# Patient Record
Sex: Female | Born: 1955 | Race: White | Hispanic: No | Marital: Married | Smoking: Current every day smoker
Health system: Southern US, Community
[De-identification: ages and names within clinical notes are randomized; demographics above are authoritative.]

## PROBLEM LIST (undated history)

## (undated) DIAGNOSIS — Z8719 Personal history of other diseases of the digestive system: Secondary | ICD-10-CM

## (undated) DIAGNOSIS — R7303 Prediabetes: Secondary | ICD-10-CM

## (undated) DIAGNOSIS — K5900 Constipation, unspecified: Secondary | ICD-10-CM

## (undated) DIAGNOSIS — E039 Hypothyroidism, unspecified: Secondary | ICD-10-CM

## (undated) DIAGNOSIS — E269 Hyperaldosteronism, unspecified: Secondary | ICD-10-CM

## (undated) DIAGNOSIS — M1990 Unspecified osteoarthritis, unspecified site: Secondary | ICD-10-CM

## (undated) DIAGNOSIS — F17210 Nicotine dependence, cigarettes, uncomplicated: Secondary | ICD-10-CM

## (undated) DIAGNOSIS — R112 Nausea with vomiting, unspecified: Secondary | ICD-10-CM

## (undated) DIAGNOSIS — E876 Hypokalemia: Secondary | ICD-10-CM

## (undated) DIAGNOSIS — G4700 Insomnia, unspecified: Secondary | ICD-10-CM

## (undated) DIAGNOSIS — K219 Gastro-esophageal reflux disease without esophagitis: Secondary | ICD-10-CM

## (undated) DIAGNOSIS — K859 Acute pancreatitis without necrosis or infection, unspecified: Secondary | ICD-10-CM

## (undated) DIAGNOSIS — J45909 Unspecified asthma, uncomplicated: Secondary | ICD-10-CM

## (undated) DIAGNOSIS — I499 Cardiac arrhythmia, unspecified: Secondary | ICD-10-CM

## (undated) DIAGNOSIS — F329 Major depressive disorder, single episode, unspecified: Secondary | ICD-10-CM

## (undated) DIAGNOSIS — G43909 Migraine, unspecified, not intractable, without status migrainosus: Secondary | ICD-10-CM

## (undated) DIAGNOSIS — E079 Disorder of thyroid, unspecified: Secondary | ICD-10-CM

## (undated) DIAGNOSIS — I4891 Unspecified atrial fibrillation: Secondary | ICD-10-CM

## (undated) DIAGNOSIS — I10 Essential (primary) hypertension: Secondary | ICD-10-CM

## (undated) DIAGNOSIS — F32A Depression, unspecified: Secondary | ICD-10-CM

## (undated) DIAGNOSIS — Z9889 Other specified postprocedural states: Secondary | ICD-10-CM

## (undated) HISTORY — DX: Essential (primary) hypertension: I10

## (undated) HISTORY — DX: Disorder of thyroid, unspecified: E07.9

## (undated) HISTORY — DX: Unspecified asthma, uncomplicated: J45.909

## (undated) HISTORY — DX: Migraine, unspecified, not intractable, without status migrainosus: G43.909

## (undated) HISTORY — DX: Hyperaldosteronism, unspecified: E26.9

## (undated) HISTORY — DX: Depression, unspecified: F32.A

## (undated) HISTORY — PX: ULNAR NERVE REPAIR: SHX2594

## (undated) HISTORY — DX: Insomnia, unspecified: G47.00

## (undated) HISTORY — DX: Major depressive disorder, single episode, unspecified: F32.9

## (undated) HISTORY — PX: OTHER SURGICAL HISTORY: SHX169

## (undated) HISTORY — DX: Nicotine dependence, cigarettes, uncomplicated: F17.210

## (undated) HISTORY — DX: Hypokalemia: E87.6

## (undated) HISTORY — DX: Unspecified atrial fibrillation: I48.91

## (undated) HISTORY — PX: SHOULDER SURGERY: SHX246

## (undated) HISTORY — DX: Acute pancreatitis without necrosis or infection, unspecified: K85.90

---

## 2013-05-15 HISTORY — PX: TOTAL ABDOMINAL HYSTERECTOMY: SHX209

## 2018-01-11 ENCOUNTER — Encounter: Payer: Self-pay | Admitting: *Deleted

## 2018-01-15 ENCOUNTER — Ambulatory Visit: Payer: BC Managed Care – PPO | Admitting: Cardiology

## 2018-01-15 ENCOUNTER — Encounter: Payer: Self-pay | Admitting: *Deleted

## 2018-01-15 VITALS — BP 100/66 | HR 78 | Ht 66.5 in | Wt 171.6 lb

## 2018-01-15 DIAGNOSIS — I480 Paroxysmal atrial fibrillation: Secondary | ICD-10-CM

## 2018-01-15 DIAGNOSIS — I10 Essential (primary) hypertension: Secondary | ICD-10-CM | POA: Diagnosis not present

## 2018-01-15 DIAGNOSIS — I351 Nonrheumatic aortic (valve) insufficiency: Secondary | ICD-10-CM

## 2018-01-15 MED ORDER — TELMISARTAN 20 MG PO TABS: 20.0000 mg | ORAL_TABLET | Freq: Every day | ORAL | 1 refills | Status: DC

## 2018-01-15 NOTE — Progress Notes (Signed)
Clinical Summary Ms. Wachter is a 62 y.o.female seen today as a new consult, referred by Dr Manuella Ghazi for history of PAF and resistant HTN.   Previously followed by Dr Ruel Favors at [facility]   1. PAF - beta blocker stopped by endocrinology - no recent palpitations - no bleeding on eliquis    CHADS2Vasc score (HTN, women)2, has been on eliquis.  - she reports 2 episodes of afib in setting of hypokalemia. She notes a monitor after this episode with her prior cardiologist that was benign - remains on eliquis.   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 No evidence of renal artery stenosis by [place]  - has had some low bp's, orthostatic hypotension 80s/50s - she lowered the telmisartan to 40mg  daily on her own.  - 1 cup of coffee, 1 cup tea. Water yeti 20 oz x 8.   3. Hyperaldosteronism - followed at [facility]. Recs for medical management after renal vein renin sampling did not localize - aldactone increased to 100mg  daily - still followed at [facility]   4. Leg tremors - being worked up at [facility] by neurology   5. Mild to moderate AI - noted by echo 2018      Prior caridologt notes  01/18/2017 Echo showed mild aortic valve sclerosis with mild to moderate aortic regurgitation otherwise no significant structural heart disease present. Lexiscan nuclear stress test was normal. 02/05/2017 nuclear stress test: Lexiscan nuclear stress tes tNo significant EKG changes. Perfusion scan is normal with a normal ejection fraction and no ischemia. This is considered low risk result Past Medical History:  Diagnosis Date  . Afib ([place])   . Asthma   . Depression   . Hyperaldosteronism ([place])   . Hypertension   . Hypokalemia   . Insomnia   . Migraine   . Nicotine dependence, cigarettes, uncomplicated   . Thyroid disease    hypothyroid     Allergies  Allergen  Reactions  . Codeine Itching     Current Outpatient Medications  Medication Sig Dispense Refill  . albuterol (PROAIR HFA) 108 (90 Base) MCG/ACT inhaler Inhale 1 puff into the lungs as needed for wheezing or shortness of breath.    Marland Kitchen apixaban (ELIQUIS) 5 MG TABS tablet Take 5 mg by mouth 2 (two) times daily.    . carbidopa-levodopa (SINEMET IR) 25-100 MG tablet Take 1 tablet by mouth at bedtime.    . citalopram (CELEXA) 10 MG tablet Take 10 mg by mouth daily.    . fluticasone (FLONASE) 50 MCG/ACT nasal spray Place 1 spray into both nostrils as needed for allergies or rhinitis.    Marland Kitchen gabapentin (NEURONTIN) 300 MG capsule Take 300 mg by mouth daily.    Marland Kitchen levothyroxine (SYNTHROID, LEVOTHROID) 112 MCG tablet Take 112 mcg by mouth daily before breakfast.    . spironolactone (ALDACTONE) 100 MG tablet Take 100 mg by mouth daily.    Marland Kitchen telmisartan (MICARDIS) 80 MG tablet Take 40 mg by mouth daily.    Marland Kitchen topiramate (TOPAMAX) 100 MG tablet Take 100 mg by mouth 2 (two) times daily.    . traZODone (DESYREL) 100 MG tablet Take 100 mg by mouth at bedtime.     No current facility-administered medications for this visit.         Allergies  Allergen Reactions  . Codeine Itching  Family History  Problem Relation Age of Onset  . Hypertension Mother   . Thyroid disease Mother   . Thyroid cancer Cousin      Social History Ms. Geraghty reports that she has been smoking. She does not have any smokeless tobacco history on file. Ms. Feeser has no alcohol history on file.   Review of Systems CONSTITUTIONAL: No weight loss, fever, chills, weakness or fatigue.  HEENT: Eyes: No visual loss, blurred vision, double vision or yellow sclerae.No hearing loss, sneezing, congestion, runny nose or sore throat.  SKIN: No rash or itching.  CARDIOVASCULAR: per hpi RESPIRATORY: No shortness of breath, cough or sputum.  GASTROINTESTINAL: No anorexia, nausea, vomiting or diarrhea. No abdominal pain or  blood.  GENITOURINARY: No burning on urination, no polyuria NEUROLOGICAL: No headache, dizziness, syncope, paralysis, ataxia, numbness or tingling in the extremities. No change in bowel or bladder control.  MUSCULOSKELETAL: No muscle, back pain, joint pain or stiffness.  LYMPHATICS: No enlarged nodes. No history of splenectomy.  PSYCHIATRIC: No history of depression or anxiety.  ENDOCRINOLOGIC: No reports of sweating, cold or heat intolerance. No polyuria or polydipsia.  Marland Kitchen   Physical Examination Vitals:   01/15/18 1440 01/15/18 1447  BP: 121/79 100/66  Pulse: 79 78  SpO2: 97% 97%   Vitals:   01/15/18 1440  Weight: 171 lb 9.6 oz (77.8 kg)  Height: 5' 6.5" (1.689 m)    Gen: resting comfortably, no acute distress HEENT: no scleral icterus, pupils equal round and reactive, no palptable cervical adenopathy,  CV: RRR, no m/r/g, no jvd Resp: Clear to auscultation bilaterally GI: abdomen is soft, non-tender, non-distended, normal bowel sounds, no hepatosplenomegaly MSK: extremities are warm, no edema.  Skin: warm, no rash Neuro:  no focal deficits Psych: appropriate affect   Diagnostic Studies Nuclear perfusion stress test: 1. Normal myocardial perfusion imaging without ischemia or infarction. 2. Normal post-stress LVEF, 55%. Normal regional wall motion. TID 0.93. 3. Patient achieved 10 METs by Bruce protocol without chest pain or EKG changes but could not reach heart rate target. With vasodilator administration, no EKG changes. Patient reported chest pain with vasodilator, a non-specific finding. 4. Hypertensive response to exercise. 5. No prior study available for comparison. Electronically signed Dr Hurshel Party, MD 02/06/17 9:35 AM  Echocardiogram: Mild sclerosis of the trileaflet aortic valve, with adequate leaflet motion. Mild-to-moderate aortic regurgitation is present. The left atrial size is normal. There is normal left ventricular size. Global left ventricular  systolic function is normal. The estimated left ventricular ejection fraction is 60% Normal left ventricular diastolic filling pattern. Normal right ventricle size and function. Normal RVSP ---------------------------------------------------------------- Electronically signed by Ruel Favors W(Interpreting physician) on 01/18/2017 06:58 PM ----------------------------------------------------------------   Thirty day event monitor: 1. Ventricular arrhythmias occasional PVCs with no couplets triplets or runs of ventricular tachycardia 2. Supraventricular arrhythmias occasional PACs with occasional short runs of nonsustained SVT 3. Atrial fibrillation/atrial flutter none 4. Bradycardia arrhythmias no significant Brady arrhythmias 5. Symptom correlation with arrhythmias patient's symptoms did occur with atrial and ventricular ectopy and short runs of nonsustained SVT  Impressions: Occasional PVCs with no high-grade ventricular ectopy Occasional PACs with occasional brief short runs of nonsustained SVT Symptoms occurred with PACs and PVCs and short runs of nonsustained SVT Overall no hemodynamically significant arrhythmias present  Electronically signed by: Holly Bodily, DO, FACC     Assessment and Plan  1. PAF - from available history appears was isolated episodes in setting of low K. I will review these records  further as she is interested in getting of eliquis is possible. With recent afib guidelines changing women with CHADS2Vasc score of 2 to a IIB recommendation, where it may be considered, likely reasonable at f/u if confirmed these were isolated episodes, she also reports a f/u monitor that was benign  2. Resistant HTN - significnatly improved on aldactone, she was diagnosed with hyperaldo - now with soft bp's, we will lower telmisartan to 20mg  daily  3. Mild to mod AI - continue to to monitor.   F/u 3 months. Consider stopping eliquis at that time pending record  review and absence of recurrence.     Arnoldo Lenis, M.D.

## 2018-01-15 NOTE — Patient Instructions (Addendum)
Your physician recommends that you schedule a follow-up appointment in: [place] has recommended you make the following change in your medication:   DECREASE TELMISARTAN 20 MG DAILY  Thank you for choosing [facility]!!

## 2018-04-18 ENCOUNTER — Ambulatory Visit: Payer: BC Managed Care – PPO | Admitting: Cardiology

## 2018-04-18 ENCOUNTER — Encounter: Payer: Self-pay | Admitting: Cardiology

## 2018-04-18 VITALS — BP 120/78 | HR 77 | Ht 67.0 in | Wt 170.8 lb

## 2018-04-18 DIAGNOSIS — I351 Nonrheumatic aortic (valve) insufficiency: Secondary | ICD-10-CM | POA: Diagnosis not present

## 2018-04-18 DIAGNOSIS — I10 Essential (primary) hypertension: Secondary | ICD-10-CM | POA: Diagnosis not present

## 2018-04-18 DIAGNOSIS — I480 Paroxysmal atrial fibrillation: Secondary | ICD-10-CM | POA: Diagnosis not present

## 2018-04-18 NOTE — Patient Instructions (Signed)
Your physician wants you to follow-up in: [place] will receive a reminder letter in the mail two months in advance. If you don't receive a letter, please call our office to schedule the follow-up appointment.  Your physician has recommended you make the following change in your medication:   STOP ELIQUIS   START ASPIRIN 81 MG DAILY   Thank you for choosing [facility]!!

## 2018-04-18 NOTE — Progress Notes (Signed)
Clinical Summary Stephanie Ewing is a 62 y.o.female seen today for follow up of the following medical problems.   Previously followed by Dr Ruel Favors at [facility]   1. PAF - beta blocker stopped by endocrinology    CHADS2Vasc score (HTN, women)2, has been on eliquis.  - she reports 2 episodes of afib in setting of hypokalemia. From chart review appears K was low to mid 2 range both times.   - 02/2017 event monitor without afib/aflutter, did have some PACs/PVCs, short runs SVT.  - remains on eliquis.   - no recent palpitaitons.       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 No evidence of renal artery stenosis by [place]   - last visit lowered telmisartan to 20mg  daily. Significnat improvement in bp's with starting aldactone.  - no recent low bp's.   3. Hyperaldosteronism - followed at [facility]. Recs for medical management after renal vein renin sampling did not localize - aldactone increased to 100mg  daily - still followed at [facility]   4. Leg tremors - being worked up at [facility] by neurology   5. Mild to moderate AI - noted by echo 2018 - no recent symptoms.       Prior caridologt notes  01/18/2017 Echo showed mild aortic valve sclerosis with mild to moderate aortic regurgitation otherwise no significant structural heart disease present. Lexiscan nuclear stress test was normal. 02/05/2017 nuclear stress test: Lexiscan nuclear stress tes tNo significant EKG changes. Perfusion scan is normal with a normal ejection fraction and no ischemia. This is considered low risk result    Past Medical History:  Diagnosis Date  . Afib ([place])   . Asthma   . Depression   . Hyperaldosteronism ([place])   . Hypertension   . Hypokalemia   . Insomnia   . Migraine   . Nicotine dependence, cigarettes, uncomplicated   . Thyroid disease    hypothyroid     Allergies  Allergen Reactions  . Codeine Itching     Current Outpatient Medications  Medication Sig Dispense Refill  . albuterol (PROAIR HFA) 108 (90 Base) MCG/ACT inhaler Inhale 1 puff into the lungs as needed for wheezing or shortness of breath.    Marland Kitchen apixaban (ELIQUIS) 5 MG TABS tablet Take 5 mg by mouth 2 (two) times daily.    . citalopram (CELEXA) 20 MG tablet Take 20 mg by mouth daily.    . fluticasone (FLONASE) 50 MCG/ACT nasal spray Place 1 spray into both nostrils as needed for allergies or rhinitis.    Marland Kitchen gabapentin (NEURONTIN) 300 MG capsule Take 300 mg by mouth 3 (three) times daily as needed.     Marland Kitchen levothyroxine (SYNTHROID, LEVOTHROID) 112 MCG tablet Take 112 mcg by mouth daily before breakfast.    . spironolactone (ALDACTONE) 100 MG tablet Take 100 mg by mouth daily.    Marland Kitchen telmisartan (MICARDIS) 20 MG tablet Take 1 tablet (20 mg total) by mouth daily. 90 tablet 1  . topiramate (TOPAMAX) 100 MG tablet Take 100 mg by mouth 2 (two) times daily.    . traZODone (DESYREL) 150 MG tablet Take 1 tablet by mouth daily.     No current facility-administered medications for this visit.         Allergies  Allergen Reactions  . Codeine Itching      Family History  Problem Relation Age of Onset  . Hypertension Mother   . Thyroid disease Mother        hypothyroidism  . Thyroid cancer Cousin   . Asthma Father   . Heart disease Father        from scarlet fever; died age 55 MVA  . Hypertension Sister   . Hypothyroidism Sister   . Hypertension Maternal Grandmother   . Heart failure Maternal Grandmother   . Diabetes Paternal Grandmother   . Heart failure Paternal Grandmother      Social History Stephanie Ewing reports that she has been smoking cigarettes. She started smoking about 47 years ago. She has never used smokeless tobacco. Stephanie Ewing has no alcohol history on file.   Review of Systems CONSTITUTIONAL: No weight loss, fever, chills, weakness or  fatigue.  HEENT: Eyes: No visual loss, blurred vision, double vision or yellow sclerae.No hearing loss, sneezing, congestion, runny nose or sore throat.  SKIN: No rash or itching.  CARDIOVASCULAR: per hpi RESPIRATORY: No shortness of breath, cough or sputum.  GASTROINTESTINAL: No anorexia, nausea, vomiting or diarrhea. No abdominal pain or blood.  GENITOURINARY: No burning on urination, no polyuria NEUROLOGICAL: No headache, dizziness, syncope, paralysis, ataxia, numbness or tingling in the extremities. No change in bowel or bladder control.  MUSCULOSKELETAL: No muscle, back pain, joint pain or stiffness.  LYMPHATICS: No enlarged nodes. No history of splenectomy.  PSYCHIATRIC: No history of depression or anxiety.  ENDOCRINOLOGIC: No reports of sweating, cold or heat intolerance. No polyuria or polydipsia.  Marland Kitchen   Physical Examination Vitals:   04/18/18 1454  BP: 120/78  Pulse: 77  SpO2: 98%   Vitals:   04/18/18 1454  Weight: 170 lb 12.8 oz (77.5 kg)  Height: 5\' 7"  (1.702 m)    Gen: resting comfortably, no acute distress HEENT: no scleral icterus, pupils equal round and reactive, no palptable cervical adenopathy,  CV: RRR, no m/r/g, no jvd Resp: Clear to auscultation bilaterally GI: abdomen is soft, non-tender, non-distended, normal bowel sounds, no hepatosplenomegaly MSK: extremities are warm, no edema.  Skin: warm, no rash Neuro:  no focal deficits Psych: appropriate affect   Diagnostic Studies 01/2017 Nuclear perfusion stress test: 1. Normal myocardial perfusion imaging without ischemia or infarction. 2. Normal post-stress LVEF, 55%. Normal regional wall motion. TID 0.93. 3. Patient achieved 10 METs by Bruce protocol without chest pain or EKG changes but could not reach heart rate target. With vasodilator administration, no EKG changes. Patient reported chest pain with vasodilator, a non-specific finding. 4. Hypertensive response to exercise. 5. No prior study available for  comparison. Electronically signed Dr Hurshel Party, MD 02/06/17 9:35 AM  01/2017 Echocardiogram: Mild sclerosis of the trileaflet aortic valve, with adequate leaflet motion. Mild-to-moderate aortic regurgitation is present. The left atrial size is normal. There is normal left ventricular size. Global left ventricular systolic function is normal. The estimated left ventricular ejection fraction is 60% Normal left ventricular diastolic filling pattern. Normal right ventricle size and function. Normal RVSP ---------------------------------------------------------------- Electronically signed by Ruel Favors W(Interpreting physician) on 01/18/2017 06:58 PM ----------------------------------------------------------------   02/2017 Thirty day event monitor: 1. Ventricular arrhythmias occasional PVCs with no couplets triplets or runs of ventricular tachycardia 2. Supraventricular arrhythmias occasional PACs with occasional short runs of nonsustained SVT 3. Atrial fibrillation/atrial flutter none 4. Bradycardia arrhythmias no significant Brady arrhythmias 5. Symptom correlation with arrhythmias patient's symptoms did occur with atrial and ventricular ectopy and short runs of nonsustained SVT  Impressions: Occasional PVCs with no high-grade ventricular  ectopy Occasional PACs with occasional brief short runs of nonsustained SVT Symptoms occurred with PACs and PVCs and short runs of nonsustained SVT Overall no hemodynamically significant arrhythmias present  Electronically signed by: Holly Bodily, DO, FACC     Assessment and Plan  1. PAF - 2 isolated episodes of afib in setting of severe hypokalemia. No clear recurence, including by a 30 day event monitor by her previous cardiologist.  - . With recent afib guidelines changing women with CHADS2Vasc score of 2 to a IIB recommendation, and no clear recurrence reasonable to d/c eliquis at this time. D/c eliquis, start ASA 81mg   daily.  - EKG today shows NSR.   2. Resistant HTN - significnatly improved on aldactone, she was diagnosed with hyperaldo - bp currently at goal, continue current meds  3. Mild to mod AI - will need to continue to monitor, repeat echo in 2-3 years or if new symptoms.    F/u 6 months   Arnoldo Lenis, M.D.

## 2018-04-19 ENCOUNTER — Encounter: Payer: Self-pay | Admitting: *Deleted

## 2018-04-19 ENCOUNTER — Encounter: Payer: Self-pay | Admitting: Cardiology

## 2018-07-12 ENCOUNTER — Other Ambulatory Visit: Payer: Self-pay | Admitting: Cardiology

## 2018-07-12 MED ORDER — TELMISARTAN 20 MG PO TABS: 20.0000 mg | ORAL_TABLET | Freq: Every day | ORAL | 1 refills | Status: DC

## 2018-07-12 NOTE — Telephone Encounter (Signed)
°*  STAT* If patient is at the pharmacy, call can be transferred to refill team.   1. Which medications need to be refilled? (please list name of each medication and dose if known) telmisartan (MICARDIS) 20 MG tablet    2. Which pharmacy/location (including street and city if local pharmacy) is medication to be sent to? [facility] [place], [place]  3. Do they need a 30 day or 90 day supply?   Patient is having medications sent to [place], [place]

## 2018-07-12 NOTE — Telephone Encounter (Signed)
REFILLED TO [facility].

## 2018-10-14 ENCOUNTER — Other Ambulatory Visit: Payer: Self-pay | Admitting: Cardiology

## 2019-04-18 ENCOUNTER — Other Ambulatory Visit: Payer: Self-pay

## 2019-04-18 ENCOUNTER — Ambulatory Visit
Admission: EM | Admit: 2019-04-18 | Discharge: 2019-04-18 | Disposition: A | Discharge status: Home / Self Care | Payer: BC Managed Care – PPO | Attending: Emergency Medicine | Admitting: Emergency Medicine

## 2019-04-18 DIAGNOSIS — J029 Acute pharyngitis, unspecified: Secondary | ICD-10-CM

## 2019-04-18 DIAGNOSIS — M546 Pain in thoracic spine: Secondary | ICD-10-CM

## 2019-04-18 DIAGNOSIS — Z20822 Contact with and (suspected) exposure to covid-19: Secondary | ICD-10-CM

## 2019-04-18 DIAGNOSIS — R05 Cough: Secondary | ICD-10-CM

## 2019-04-18 DIAGNOSIS — Z03818 Encounter for observation for suspected exposure to other biological agents ruled out: Secondary | ICD-10-CM

## 2019-04-18 DIAGNOSIS — R509 Fever, unspecified: Secondary | ICD-10-CM

## 2019-04-18 DIAGNOSIS — Z20828 Contact with and (suspected) exposure to other viral communicable diseases: Secondary | ICD-10-CM | POA: Diagnosis not present

## 2019-04-18 DIAGNOSIS — R059 Cough, unspecified: Secondary | ICD-10-CM

## 2019-04-18 LAB — POCT INFLUENZA A/B
Influenza A, POC: NEGATIVE
Influenza B, POC: NEGATIVE

## 2019-04-18 LAB — POCT URINALYSIS DIP (MANUAL ENTRY)
Bilirubin, UA: NEGATIVE
Glucose, UA: NEGATIVE mg/dL
Ketones, POC UA: NEGATIVE mg/dL
Leukocytes, UA: NEGATIVE
Nitrite, UA: NEGATIVE
Protein Ur, POC: NEGATIVE mg/dL
Spec Grav, UA: 1.015 (ref 1.010–1.025)
Urobilinogen, UA: 0.2 E.U./dL
pH, UA: 5 (ref 5.0–8.0)

## 2019-04-18 LAB — POC SARS CORONAVIRUS 2 AG -  ED: SARS Coronavirus 2 Ag: NEGATIVE

## 2019-04-18 MED ORDER — BENZONATATE 100 MG PO CAPS: 100.0000 mg | ORAL_CAPSULE | Freq: Three times a day (TID) | ORAL | 0 refills | Status: DC

## 2019-04-18 NOTE — ED Triage Notes (Signed)
Pt developed coughing last nigh and then fever of 103 developed today , took tylenol 1 hour ago

## 2019-04-18 NOTE — ED Provider Notes (Signed)
[place]   [id number] 04/18/19 Arrival Time: 4332   CC: COVID symptoms  SUBJECTIVE: History from: patient.  Stephanie Ewing is a 63 y.o. female hx significant for afib, asthma, hyperaldosteronism, HTN, hypokalemia, and hypothyroid, who presents with fever, tmax of 103, chills, body aches, sore throat, and dry cough that began last night.  Denies sick exposure to COVID, flu or strep.  Denies recent travel.  Has tried tylenol with relief.  Symptoms are made worse at night.  Denies previous symptoms in the past.   Complains of back pain, over flanks.  Denies  rhinorrhea, SOB, wheezing, chest pain, nausea, vomiting, changes in bowel or bladder habits.    ROS: As per HPI.  All other pertinent ROS negative.     Past Medical History:  Diagnosis Date  . Afib ([place])   . Asthma   . Depression   . Hyperaldosteronism ([place])   . Hypertension   . Hypokalemia   . Insomnia   . Migraine   . Nicotine dependence, cigarettes, uncomplicated   . Thyroid disease    hypothyroid   Past Surgical History:  Procedure Laterality Date  . TOTAL ABDOMINAL HYSTERECTOMY  2015   Allergies  Allergen Reactions  . Codeine Itching   No current facility-administered medications on file prior to encounter.    Current Outpatient Medications on File Prior to Encounter  Medication Sig Dispense Refill  . albuterol (PROAIR HFA) 108 (90 Base) MCG/ACT inhaler Inhale 1 puff into the lungs as needed for wheezing or shortness of breath.    Marland Kitchen aspirin EC 81 MG tablet Take 81 mg by mouth daily.    . citalopram (CELEXA) 20 MG tablet Take 20 mg by mouth daily.    . fexofenadine (ALLEGRA) 180 MG tablet Take 180 mg by mouth daily.    . fluticasone (FLONASE) 50 MCG/ACT nasal spray Place 1 spray into both nostrils as needed for allergies or rhinitis.    Marland Kitchen gabapentin (NEURONTIN) 300 MG capsule Take 300 mg by mouth 3 (three) times daily as needed.     Marland Kitchen levothyroxine (SYNTHROID, LEVOTHROID) 112 MCG tablet Take 112  mcg by mouth daily before breakfast.    . montelukast (SINGULAIR) 10 MG tablet Take 10 mg by mouth daily.    Marland Kitchen spironolactone (ALDACTONE) 100 MG tablet Take 100 mg by mouth daily.    Marland Kitchen telmisartan (MICARDIS) 20 MG tablet TAKE 1 TABLET BY MOUTH EVERY DAY 90 tablet 3  . topiramate (TOPAMAX) 100 MG tablet Take 100 mg by mouth 2 (two) times daily.    . traZODone (DESYREL) 150 MG tablet Take 1 tablet by mouth daily.     Social History   Socioeconomic History  . Marital status: Married    Spouse name: Not on file  . Number of children: Not on file  . Years of education: Not on file  . Highest education level: Not on file  Occupational History  . Not on file  Social Needs  . Financial resource strain: Not on file  . Food insecurity    Worry: Not on file    Inability: Not on file  . Transportation needs    Medical: Not on file    Non-medical: Not on file  Tobacco Use  . Smoking status: Current Every Day Smoker    Packs/day: 0.50    Types: Cigarettes    Start date: 03/12/1971  . Smokeless tobacco: Never Used  Substance and Sexual Activity  . Alcohol use: Not on file  .  Drug use: Not on file  . Sexual activity: Not on file  Lifestyle  . Physical activity    Days per week: Not on file    Minutes per session: Not on file  . Stress: Not on file  Relationships  . Social Herbalist on phone: Not on file    Gets together: Not on file    Attends religious service: Not on file    Active member of club or organization: Not on file    Attends meetings of clubs or organizations: Not on file    Relationship status: Not on file  . Intimate partner violence    Fear of current or ex partner: Not on file    Emotionally abused: Not on file    Physically abused: Not on file    Forced sexual activity: Not on file  Other Topics Concern  . Not on file  Social History Narrative  . Not on file   Family History  Problem Relation Age of Onset  . Hypertension Mother   . Thyroid  disease Mother        hypothyroidism  . Thyroid cancer Cousin   . Asthma Father   . Heart disease Father        from scarlet fever; died age 10 MVA  . Hypertension Sister   . Hypothyroidism Sister   . Hypertension Maternal Grandmother   . Heart failure Maternal Grandmother   . Diabetes Paternal Grandmother   . Heart failure Paternal Grandmother     OBJECTIVE:  Vitals:   04/18/19 1540  BP: 111/73  Pulse: 99  Resp: 20  Temp: 99.6 F (37.6 C)  SpO2: 99%     General appearance: alert; appears fatigued, but nontoxic; speaking in full sentences and tolerating own secretions HEENT: NCAT; Ears: EACs clear, TMs pearly gray; Eyes: PERRL.  EOM grossly intact. Nose: nares patent without rhinorrhea, Throat: oropharynx clear, tonsils non erythematous or enlarged, uvula midline  Neck: supple without LAD Lungs: unlabored respirations, symmetrical air entry; cough: mild; no respiratory distress; CTAB Heart: regular rate and rhythm.  Radial pulses 2+ symmetrical bilaterally Skin: warm and dry Psychological: alert and cooperative; normal mood and affect  LABS:  Results for orders placed or performed during the hospital encounter of 04/18/19 (from the past 24 hour(s))  POC SARS Coronavirus 2 Ag-ED - Nasal Swab (BD Veritor Kit)     Status: None   Collection Time: 04/18/19  3:53 PM  Result Value Ref Range   SARS Coronavirus 2 Ag Negative Negative  POCT urinalysis dipstick     Status: Abnormal   Collection Time: 04/18/19  3:53 PM  Result Value Ref Range   Color, UA yellow yellow   Clarity, UA clear clear   Glucose, UA negative negative mg/dL   Bilirubin, UA negative negative   Ketones, POC UA negative negative mg/dL   Spec Grav, UA 1.015 1.010 - 1.025   Blood, UA trace-intact (A) negative   pH, UA 5.0 5.0 - 8.0   Protein Ur, POC negative negative mg/dL   Urobilinogen, UA 0.2 0.2 or 1.0 E.U./dL   Nitrite, UA Negative Negative   Leukocytes, UA Negative Negative  POCT Influenza A/B      Status: None   Collection Time: 04/18/19  4:10 PM  Result Value Ref Range   Influenza A, POC Negative Negative   Influenza B, POC Negative Negative     ASSESSMENT & PLAN:  1. Suspected COVID-19 virus infection   2. Cough  3. Lab test negative for COVID-19 virus   4. Acute bilateral thoracic back pain     Meds ordered this encounter  Medications  . benzonatate (TESSALON) 100 MG capsule    Sig: Take 1 capsule (100 mg total) by mouth every 8 (eight) hours.    Dispense:  21 capsule    Refill:  0    Order Specific Question:   Supervising Provider    Answer:   Raylene Everts [[id number]]    Rapid COVID negative.  Culture sent.  Patient should remain in quarantine until they have received culture results.  If negative you may resume normal activities (go back to work/school) while practicing hand hygiene, social distance, and mask wearing.  If positive, patient should remain in quarantine for 10 days from symptom onset AND greater than 72 hours after symptoms resolution (absence of fever without the use of fever-reducing medication and improvement in respiratory symptoms), whichever is longer Get plenty of rest and push fluids Use OTC zyrtec for nasal congestion, runny nose, and/or sore throat Use OTC flonase for nasal congestion and runny nose Use medications daily for symptom relief Use OTC medications like ibuprofen or tylenol as needed fever or pain Follow up with PCP via phone or e-visit for recheck and to ensure symptoms are improving Call or go to the ED if you have any new or worsening symptoms such as fever, worsening cough, shortness of breath, chest tightness, chest pain, turning blue, changes in mental status, etc...    Reviewed expectations re: course of current medical issues. Questions answered. Outlined signs and symptoms indicating need for more acute intervention. Patient verbalized understanding. After Visit Summary given.         Lestine Box, PA-C  04/18/19 1649

## 2019-04-18 NOTE — Discharge Instructions (Addendum)
Rapid COVID negative.  Culture sent.  Patient should remain in quarantine until they have received culture results.  If negative you may resume normal activities (go back to work/school) while practicing hand hygiene, social distance, and mask wearing.  If positive, patient should remain in quarantine for 10 days from symptom onset AND greater than 72 hours after symptoms resolution (absence of fever without the use of fever-reducing medication and improvement in respiratory symptoms), whichever is longer Get plenty of rest and push fluids Use OTC zyrtec for nasal congestion, runny nose, and/or sore throat Use OTC flonase for nasal congestion and runny nose Use medications daily for symptom relief Use OTC medications like ibuprofen or tylenol as needed fever or pain Follow up with PCP via phone or e-visit for recheck and to ensure symptoms are improving Call or go to the ED if you have any new or worsening symptoms such as fever, worsening cough, shortness of breath, chest tightness, chest pain, turning blue, changes in mental status, etc..Marland Kitchen

## 2019-04-20 LAB — NOVEL CORONAVIRUS, NAA: SARS-CoV-2, NAA: NOT DETECTED

## 2019-09-19 ENCOUNTER — Other Ambulatory Visit: Payer: Self-pay

## 2019-09-19 ENCOUNTER — Ambulatory Visit (HOSPITAL_COMMUNITY)
Admission: RE | Admit: 2019-09-19 | Discharge: 2019-09-19 | Disposition: A | Discharge status: Home / Self Care | Payer: BC Managed Care – PPO | Source: Ambulatory Visit | Attending: Nurse Practitioner | Admitting: Nurse Practitioner

## 2019-09-19 ENCOUNTER — Telehealth: Payer: Self-pay | Admitting: Cardiology

## 2019-09-19 ENCOUNTER — Encounter (HOSPITAL_COMMUNITY): Payer: Self-pay | Admitting: Nurse Practitioner

## 2019-09-19 VITALS — BP 120/70 | HR 72 | Ht 67.0 in | Wt 178.6 lb

## 2019-09-19 DIAGNOSIS — Z7902 Long term (current) use of antithrombotics/antiplatelets: Secondary | ICD-10-CM | POA: Insufficient documentation

## 2019-09-19 DIAGNOSIS — F329 Major depressive disorder, single episode, unspecified: Secondary | ICD-10-CM | POA: Insufficient documentation

## 2019-09-19 DIAGNOSIS — I479 Paroxysmal tachycardia, unspecified: Secondary | ICD-10-CM | POA: Diagnosis not present

## 2019-09-19 DIAGNOSIS — Z79899 Other long term (current) drug therapy: Secondary | ICD-10-CM | POA: Diagnosis not present

## 2019-09-19 DIAGNOSIS — E269 Hyperaldosteronism, unspecified: Secondary | ICD-10-CM | POA: Diagnosis not present

## 2019-09-19 DIAGNOSIS — F17210 Nicotine dependence, cigarettes, uncomplicated: Secondary | ICD-10-CM | POA: Insufficient documentation

## 2019-09-19 DIAGNOSIS — I10 Essential (primary) hypertension: Secondary | ICD-10-CM | POA: Insufficient documentation

## 2019-09-19 DIAGNOSIS — Z8249 Family history of ischemic heart disease and other diseases of the circulatory system: Secondary | ICD-10-CM | POA: Insufficient documentation

## 2019-09-19 DIAGNOSIS — I480 Paroxysmal atrial fibrillation: Secondary | ICD-10-CM | POA: Diagnosis not present

## 2019-09-19 DIAGNOSIS — Z7982 Long term (current) use of aspirin: Secondary | ICD-10-CM | POA: Insufficient documentation

## 2019-09-19 DIAGNOSIS — E876 Hypokalemia: Secondary | ICD-10-CM | POA: Diagnosis not present

## 2019-09-19 DIAGNOSIS — E079 Disorder of thyroid, unspecified: Secondary | ICD-10-CM | POA: Diagnosis not present

## 2019-09-19 MED ORDER — DILTIAZEM HCL 30 MG PO TABS: ORAL_TABLET | ORAL | 1 refills | Status: AC

## 2019-09-19 NOTE — Telephone Encounter (Signed)
Patient called stating that she is not "feeling right" states that she thinks she is having Atrial Fib with nausea and headache States "Can I come into the office for an EKG" Dr. Matthew Folks. Patient does not want to go to hospital if she can avoid   Requested to be call on [phone].

## 2019-09-19 NOTE — Telephone Encounter (Signed)
Reports rapid heart beat lightheaded, nausea, a little SOB and headache. Pulse Ox showed 100-105 Pulse ox 94% BP 133/85. HR dropped to 62. Reports pulse feels irregular. Denies chest pain or palpitations. Heart rate by Pulse ox now is 65. Advised to call our A-Fib Clinic to be seen today at 1:30 pm and to get clear instructions on location and parking code. Verbalized understanding.

## 2019-09-19 NOTE — Progress Notes (Signed)
Primary Care Physician: Monico Blitz, MD Referring Physician: Dr. Charise Killian is a 64 y.o. female with a h/o remote afib in the setting of hypokalemia with new dx of hyperaldosteronism. This  was diagnosed 2 years ago and has been quiet since then. She  was taken off eliquis just after dx. This  Am, she was aware of irregular heart beat with feeling nauseated, weak and with H/A. She called her local cardiologist office as she wanted an EKG to document her arrhthymia and this could not be arranged. She was referred here.   She is now in SR but still feels washed out and HA remains. She is a retired Statistician. She denies alcohol use, she does smoke, drinks 2 cups of caffeine in the am. She  had a sleep study in the past but it was negative per pt.   She denied any chest pain with the episode. CHA2DS2VASc score is  2, will be 3 in October with her BD, turning 64. .  Today, she denies symptoms of palpitations, chest pain, shortness of breath, orthopnea, PND, lower extremity edema, dizziness, presyncope, syncope, or neurologic sequela. The patient is tolerating medications without difficulties and is otherwise without complaint today.   Past Medical History:  Diagnosis Date  . Afib ([place])   . Asthma   . Depression   . Hyperaldosteronism ([place])   . Hypertension   . Hypokalemia   . Insomnia   . Migraine   . Nicotine dependence, cigarettes, uncomplicated   . Thyroid disease    hypothyroid   Past Surgical History:  Procedure Laterality Date  . TOTAL ABDOMINAL HYSTERECTOMY  2015    Current Outpatient Medications  Medication Sig Dispense Refill  . albuterol (PROAIR HFA) 108 (90 Base) MCG/ACT inhaler Inhale 1 puff into the lungs as needed for wheezing or shortness of breath.    Marland Kitchen aspirin EC 81 MG tablet Take 81 mg by mouth daily.    . citalopram (CELEXA) 20 MG tablet Take 20 mg by mouth daily.    . famotidine (PEPCID) 20 MG tablet Take 20 mg by mouth daily.    .  fexofenadine (ALLEGRA) 180 MG tablet Take 180 mg by mouth daily.    Marland Kitchen gabapentin (NEURONTIN) 300 MG capsule Take 300 mg by mouth 3 (three) times daily as needed.     Marland Kitchen levothyroxine (SYNTHROID, LEVOTHROID) 112 MCG tablet Take 112 mcg by mouth daily before breakfast.    . meloxicam (MOBIC) 15 MG tablet Take 15 mg by mouth daily.    . montelukast (SINGULAIR) 10 MG tablet Take 10 mg by mouth daily.    . pantoprazole (PROTONIX) 40 MG tablet Take 40 mg by mouth daily.    . RESTASIS 0.05 % ophthalmic emulsion 1 drop 2 (two) times daily.    Marland Kitchen spironolactone (ALDACTONE) 100 MG tablet Take 100 mg by mouth daily.    Marland Kitchen telmisartan (MICARDIS) 20 MG tablet TAKE 1 TABLET BY MOUTH EVERY DAY 90 tablet 3  . topiramate (TOPAMAX) 100 MG tablet Take 100 mg by mouth 2 (two) times daily.    . traZODone (DESYREL) 150 MG tablet Take 1 tablet by mouth daily.    Marland Kitchen diltiazem (CARDIZEM) 30 MG tablet Take 1 tablet every 4 hours AS NEEDED for heart rate >100 as long as top blood pressure >100. 45 tablet 1   No current facility-administered medications for this encounter.    Allergies  Allergen Reactions  . Codeine Itching  Social History   Socioeconomic History  . Marital status: Married    Spouse name: Not on file  . Number of children: Not on file  . Years of education: Not on file  . Highest education level: Not on file  Occupational History  . Not on file  Tobacco Use  . Smoking status: Current Every Day Smoker    Packs/day: 0.50    Types: Cigarettes    Start date: 03/12/1971  . Smokeless tobacco: Never Used  . Tobacco comment: 1 pack daily  Substance and Sexual Activity  . Alcohol use: Not Currently  . Drug use: Never  . Sexual activity: Not on file  Other Topics Concern  . Not on file  Social History Narrative  . Not on file   Social Determinants of Health   Financial Resource Strain:   . Difficulty of Paying Living Expenses:   Food Insecurity:   . Worried About Charity fundraiser in  the Last Year:   . Arboriculturist in the Last Year:   Transportation Needs:   . Film/video editor (Medical):   Marland Kitchen Lack of Transportation (Non-Medical):   Physical Activity:   . Days of Exercise per Week:   . Minutes of Exercise per Session:   Stress:   . Feeling of Stress :   Social Connections:   . Frequency of Communication with Friends and Family:   . Frequency of Social Gatherings with Friends and Family:   . Attends Religious Services:   . Active Member of Clubs or Organizations:   . Attends Archivist Meetings:   Marland Kitchen Marital Status:   Intimate Partner Violence:   . Fear of Current or Ex-Partner:   . Emotionally Abused:   Marland Kitchen Physically Abused:   . Sexually Abused:     Family History  Problem Relation Age of Onset  . Hypertension Mother   . Thyroid disease Mother        hypothyroidism  . Thyroid cancer Cousin   . Asthma Father   . Heart disease Father        from scarlet fever; died age 70 MVA  . Hypertension Sister   . Hypothyroidism Sister   . Hypertension Maternal Grandmother   . Heart failure Maternal Grandmother   . Diabetes Paternal Grandmother   . Heart failure Paternal Grandmother     ROS- All systems are reviewed and negative except as per the HPI above  Physical Exam: Vitals:   09/19/19 1320  BP: 120/70  Pulse: 72  Weight: 81 kg  Height: 5\' 7"  (1.702 m)   Wt Readings from Last 3 Encounters:  09/19/19 81 kg  04/18/18 77.5 kg  01/15/18 77.8 kg    Labs: No results found for: NA, K, CL, CO2, GLUCOSE, BUN, CREATININE, CALCIUM, PHOS, MG No results found for: INR No results found for: CHOL, HDL, LDLCALC, TRIG   GEN- The patient is well appearing, alert and oriented x 3 today.   Head- normocephalic, atraumatic Eyes-  Sclera clear, conjunctiva pink Ears- hearing intact Oropharynx- clear Neck- supple, no JVP Lymph- no cervical lymphadenopathy Lungs- Clear to ausculation bilaterally, normal work of breathing Heart- Regular rate and  rhythm, no murmurs, rubs or gallops, PMI not laterally displaced GI- soft, NT, ND, + BS Extremities- no clubbing, cyanosis, or edema MS- no significant deformity or atrophy Skin- no rash or lesion Psych- euthymic mood, full affect Neuro- strength and sensation are intact  EKG- NSR at 72 bpm, pr int  150 ms, qrs int 84 ms, qtc 453 ms   Assessment and Plan: 1. Paroxysmal afib  By pt's description her episode this am sounds very suggestive of  afib  Triggers reviewed and no trigger identified She does smoke and cessation encouraged  We discussed means to document afib,  apple watch vrs [employer] She is leaning toward purchase of an Apple watch  I will also prescribe cardizem 30 mg to use if afib reoccurs  2. CHA2DS2VASc score of 2 By guidelines does not need to be on anticoagulation at this point, will be 3 on her BD 10/28. If reoccurs at that point and can be documented, then eliquis will need  to be resumed    F/u with Dr. Harl Bowie in 12 weeks, appointment requested, may need updated echo/stress test  afib clinic as needed    Revella C. Yvonna Brun, [facility] [address] [place], [place] [address] [phone]

## 2019-09-19 NOTE — Telephone Encounter (Signed)
Error

## 2019-09-28 NOTE — Progress Notes (Signed)
Cardiology Office Note  Date: 09/29/2019   ID: Stephanie Ewing, DOB [birth year], MRN [id number]  PCP:  Stephanie Blitz, MD  Cardiologist:  Stephanie Dolly, MD Electrophysiologist:  None   Chief Complaint: F/U atrial fibrillation, hyperaldosteronism,  History of Present Illness: Stephanie Ewing is a 64 y.o. female with a history of  atrial fibrillation(CHA2DS2VASC =2) 3 on 10/28, hyperaldosteronism.  Called the office 09/19/2019: reported rapid heart beat, lightheadedness, nausea, sob, and H/A. Seen later that day at A Fib clinic. She was first dx with atrial fibrillation 2 years prior and had experienced no palpitations or symptoms until recently. She was in NSR on EKG at A fib clinic. She continued to feel washed out with a H/A. She denied any other issues. Diltiazem 30 mg prn q 4 prescribed. She continued to smoke.  Patient is here for follow-up of her recent episode atrial fibrillation.  She was seen in atrial fibrillation clinic in follow-up.  She was prescribed Cardizem 30 mg p.o. as needed every 4 for episodes of atrial fibrillation.  Patient states she is very frustrated over the fact that she did not receive a an immediate EKG to capture her atrial fibrillation when she first call.  She states the atrial fibrillation episodes have not recurred since that day on 09/19/2019.  She does state however that she has noticed a change in herself.  States she is having much more fatigue with being extremely dizzy at times with some nausea.  She states when the episode occurred 2 weeks ago her heart rate was between 100 - 110 for at least 2 hours and then suddenly dropped to 60 bpm.  She has had no recurrences since that time.  She has hyperaldosteronism and is being managed by Dr. Brenton Ewing endocrinology.  She has a 20-year history of smoking 1/2 pack/day and continues to smoke.   Past Medical History:  Diagnosis Date  . Afib ([place])   . Asthma   . Depression   . Hyperaldosteronism ([place])    . Hypertension   . Hypokalemia   . Insomnia   . Migraine   . Nicotine dependence, cigarettes, uncomplicated   . Thyroid disease    hypothyroid    Past Surgical History:  Procedure Laterality Date  . TOTAL ABDOMINAL HYSTERECTOMY  2015    Current Outpatient Medications  Medication Sig Dispense Refill  . albuterol (PROAIR HFA) 108 (90 Base) MCG/ACT inhaler Inhale 1 puff into the lungs as needed for wheezing or shortness of breath.    Marland Kitchen aspirin EC 81 MG tablet Take 81 mg by mouth daily.    . citalopram (CELEXA) 20 MG tablet Take 20 mg by mouth daily.    Marland Kitchen diltiazem (CARDIZEM) 30 MG tablet Take 1 tablet every 4 hours AS NEEDED for heart rate >100 as long as top blood pressure >100. 45 tablet 1  . famotidine (PEPCID) 20 MG tablet Take 20 mg by mouth daily.    . fexofenadine (ALLEGRA) 180 MG tablet Take 180 mg by mouth daily.    Marland Kitchen gabapentin (NEURONTIN) 300 MG capsule Take 300 mg by mouth 3 (three) times daily as needed.     Marland Kitchen levothyroxine (SYNTHROID, LEVOTHROID) 112 MCG tablet Take 112 mcg by mouth daily before breakfast.    . meloxicam (MOBIC) 15 MG tablet Take 15 mg by mouth daily.    . montelukast (SINGULAIR) 10 MG tablet Take 10 mg by mouth daily.    . pantoprazole (PROTONIX) 40 MG tablet Take 40 mg  by mouth daily.    . RESTASIS 0.05 % ophthalmic emulsion 1 drop 2 (two) times daily.    Marland Kitchen spironolactone (ALDACTONE) 100 MG tablet Take 100 mg by mouth daily.    Marland Kitchen telmisartan (MICARDIS) 20 MG tablet TAKE 1 TABLET BY MOUTH EVERY DAY 90 tablet 3  . topiramate (TOPAMAX) 100 MG tablet Take 100 mg by mouth 2 (two) times daily.    . traZODone (DESYREL) 150 MG tablet Take 1 tablet by mouth daily.     No current facility-administered medications for this visit.   Allergies:  Codeine   Social History: The patient  reports that she has been smoking cigarettes. She started smoking about 48 years ago. She has been smoking about 0.50 packs per day. She has never used smokeless tobacco. She  reports previous alcohol use. She reports that she does not use drugs.   Family History: The patient's family history includes Asthma in her father; Diabetes in her paternal grandmother; Heart disease in her father; Heart failure in her maternal grandmother and paternal grandmother; Hypertension in her maternal grandmother, mother, and sister; Hypothyroidism in her sister; Thyroid cancer in her cousin; Thyroid disease in her mother.   ROS:  Please see the history of present illness. Otherwise, complete review of systems is positive for none.  All other systems are reviewed and negative.   Physical Exam: VS:  BP 118/76   Pulse 77   Ht 5\' 7"  (1.702 m)   Wt 176 lb (79.8 kg)   SpO2 95%   BMI 27.57 kg/m , BMI Body mass index is 27.57 kg/m.  Wt Readings from Last 3 Encounters:  09/29/19 176 lb (79.8 kg)  09/19/19 178 lb 9.6 oz (81 kg)  04/18/18 170 lb 12.8 oz (77.5 kg)    General: Patient appears comfortable at rest. Neck: Supple, no elevated JVP or carotid bruits, no thyromegaly. Lungs: Clear to auscultation, nonlabored breathing at rest. Cardiac: Regular rate and rhythm, no S3 or significant systolic murmur, no pericardial rub. Extremities: No pitting edema, distal pulses 2+. Skin: Warm and dry. Musculoskeletal: No kyphosis. Neuropsychiatric: Alert and oriented x3, affect grossly appropriate.  ECG:  An ECG dated 09/19/2019 was personally reviewed today and demonstrated:  Sinus rhythm 72.  Recent Labwork: No results found for requested labs within last 8760 hours.  No results found for: CHOL, TRIG, HDL, CHOLHDL, VLDL, LDLCALC, LDLDIRECT  Other Studies Reviewed Today:   Assessment and Plan:  1. SOB (shortness of breath)   2. Paroxysmal tachycardia ([place])   3. Essential hypertension   4. Hyperaldosteronism ([place])    1. SOB (shortness of breath) Patient states she is noticing some increased fatigue, some shortness of breath, with some dizziness and nausea.  She states she has been  diagnosed with aortic regurgitation in the past.  Please get a 2D echocardiogram.  2. Paroxysmal tachycardia ([place]) Recent new episode of rapid heart rate between 101 110.  Patient called the office requesting an EKG.  She was referred to atrial fibrillation clinic.  EKG at atrial fibrillation clinic patient was in a normal sinus rhythm.  Cardizem 30 mg every 4 hours as needed was ordered.  Patient states she has not had to use Cardizem since that time and has had no further palpitations or arrhythmias.  3. Essential hypertension Blood pressure 118/76.  Patient states she has been holding her antihypertensive medication telmisartan.  She states Dr. Harl Bowie told her she could do so if her blood pressure was within the normal range.  Continue spironolactone 100 mg daily, telmisartan 20 mg daily.  4. Hyperaldosteronism ([place]) Diagnosed 3 years ago.  Sees Dr. Nicola Girt endocrinology for management  Medication Adjustments/Labs and Tests Ordered: Current medicines are reviewed at length with the patient today.  Concerns regarding medicines are outlined above.   Disposition: Follow-up with Dr. Harl Bowie or APP 2 months Signed, Levell July, NP 09/29/2019 11:38 AM    [facility] [place], [place],  [address] Phone: [phone]; Fax: [phone]

## 2019-09-29 ENCOUNTER — Ambulatory Visit: Payer: BC Managed Care – PPO | Admitting: Family Medicine

## 2019-09-29 ENCOUNTER — Encounter: Payer: Self-pay | Admitting: Family Medicine

## 2019-09-29 ENCOUNTER — Other Ambulatory Visit: Payer: Self-pay

## 2019-09-29 VITALS — BP 118/76 | HR 77 | Ht 67.0 in | Wt 176.0 lb

## 2019-09-29 DIAGNOSIS — I479 Paroxysmal tachycardia, unspecified: Secondary | ICD-10-CM

## 2019-09-29 DIAGNOSIS — E269 Hyperaldosteronism, unspecified: Secondary | ICD-10-CM

## 2019-09-29 DIAGNOSIS — I10 Essential (primary) hypertension: Secondary | ICD-10-CM | POA: Diagnosis not present

## 2019-09-29 DIAGNOSIS — R0602 Shortness of breath: Secondary | ICD-10-CM

## 2019-09-29 NOTE — Patient Instructions (Addendum)
Your physician recommends that you schedule a follow-up appointment in: 2 MONTHS WITH DR BRANCH  Your physician recommends that you continue on your current medications as directed. Please refer to the Current Medication list given to you today.  Your physician has requested that you have an echocardiogram. Echocardiography is a painless test that uses sound waves to create images of your heart. It provides your doctor with information about the size and shape of your heart and how well your heart's chambers and valves are working. This procedure takes approximately one hour. There are no restrictions for this procedure.  Thank you for choosing [facility]!!     

## 2019-10-06 ENCOUNTER — Other Ambulatory Visit: Payer: Self-pay | Admitting: *Deleted

## 2019-10-06 MED ORDER — TELMISARTAN 20 MG PO TABS: 20.0000 mg | ORAL_TABLET | Freq: Every day | ORAL | 3 refills | Status: DC

## 2019-10-29 ENCOUNTER — Ambulatory Visit (INDEPENDENT_AMBULATORY_CARE_PROVIDER_SITE_OTHER): Payer: BC Managed Care – PPO

## 2019-10-29 DIAGNOSIS — R0602 Shortness of breath: Secondary | ICD-10-CM

## 2019-10-30 ENCOUNTER — Telehealth: Payer: Self-pay | Admitting: *Deleted

## 2019-10-30 NOTE — Telephone Encounter (Signed)
-----   Message from Verta Ellen., NP sent at 10/30/2019 11:56 AM EDT ----- Please call the patient and tell her the echocardiogram showed the pumping function of her heart is good. One valve has some very minor leakage but nothing to be concerned about. Her aortic valve is a moderately leaky but no major change from the previous echocardiogram in 2018 when it was considered to be mild to moderate. Nothing that would contribute greatly to increased shortness of breath.

## 2019-10-30 NOTE — Telephone Encounter (Signed)
Patient informed. Copy sent to PCP °

## 2019-12-08 ENCOUNTER — Ambulatory Visit: Payer: BC Managed Care – PPO | Admitting: Cardiology

## 2019-12-08 ENCOUNTER — Other Ambulatory Visit: Payer: Self-pay

## 2019-12-08 ENCOUNTER — Encounter: Payer: Self-pay | Admitting: Cardiology

## 2019-12-08 VITALS — BP 114/68 | HR 74 | Ht 67.0 in | Wt 178.8 lb

## 2019-12-08 DIAGNOSIS — I351 Nonrheumatic aortic (valve) insufficiency: Secondary | ICD-10-CM

## 2019-12-08 DIAGNOSIS — I10 Essential (primary) hypertension: Secondary | ICD-10-CM | POA: Diagnosis not present

## 2019-12-08 DIAGNOSIS — I480 Paroxysmal atrial fibrillation: Secondary | ICD-10-CM

## 2019-12-08 NOTE — Progress Notes (Signed)
Clinical Summary Stephanie Ewing is a 64 y.o.female seen today for follow up of the following medical problems.   Previously followed by Dr Ruel Favors at [facility]   1. PAF - beta blocker stopped by endocrinology  CHADS2Vasc score (HTN, women)2, has been on eliquis.  - she reports 2 episodes of afib in setting of hypokalemia. From chart review appears K was low to mid 2 range both times.   - 02/2017 event monitor without afib/aflutter, did have some PACs/PVCs, short runs SVT.  - remains on eliquis.   - rare very infrequent palpitations.  - did have a long episode in 09/2019. None since that time.     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 No evidence of renal artery stenosis by [place]   - she is off telmisartan, bp's look good. Had some significant dizziness on it.   3. Hyperaldosteronism - followed at [facility] Recs for medical management after renal vein renin sampling did not localize - aldactone increased to 100mg  daily - still followed at [facility]   4. Leg tremors - being worked up at [facility] by neurology   5. Mild to moderate AI - noted by echo 2018   - 10/2019 echo LVEF 60-65%, mod AI        Prior caridologt notes 01/18/2017 Echo showed mild aortic valve sclerosis with mild to moderate aortic regurgitation otherwise no significant structural heart disease present. Lexiscan nuclear stress test was normal. 02/05/2017 nuclear stress test: Lexiscan nuclear stress tes tNo significant EKG changes. Perfusion scan is normal with a normal ejection fraction and no ischemia. This is considered low risk result   Past Medical History:  Diagnosis Date  . Afib ([place])   . Asthma   . Depression   . Hyperaldosteronism ([place])   . Hypertension   . Hypokalemia   . Insomnia   . Migraine   . Nicotine dependence, cigarettes,  uncomplicated   . Thyroid disease    hypothyroid     Allergies  Allergen Reactions  . Codeine Itching     Current Outpatient Medications  Medication Sig Dispense Refill  . albuterol (PROAIR HFA) 108 (90 Base) MCG/ACT inhaler Inhale 1 puff into the lungs as needed for wheezing or shortness of breath.    Marland Kitchen aspirin EC 81 MG tablet Take 81 mg by mouth daily.    . citalopram (CELEXA) 20 MG tablet Take 20 mg by mouth daily.    Marland Kitchen diltiazem (CARDIZEM) 30 MG tablet Take 1 tablet every 4 hours AS NEEDED for heart rate >100 as long as top blood pressure >100. 45 tablet 1  . famotidine (PEPCID) 20 MG tablet Take 20 mg by mouth daily.    . fexofenadine (ALLEGRA) 180 MG tablet Take 180 mg by mouth daily.    Marland Kitchen gabapentin (NEURONTIN) 300 MG capsule Take 300 mg by mouth 3 (three) times daily as needed.     Marland Kitchen levothyroxine (SYNTHROID, LEVOTHROID) 112 MCG tablet Take 112 mcg by mouth daily before breakfast.    . meloxicam (MOBIC) 15 MG tablet Take 15 mg by mouth daily.    . montelukast (SINGULAIR) 10 MG tablet Take 10 mg by mouth daily.    . pantoprazole (PROTONIX) 40 MG tablet Take 40 mg by mouth daily.    . RESTASIS 0.05 % ophthalmic emulsion 1 drop 2 (two) times daily.    Marland Kitchen  spironolactone (ALDACTONE) 100 MG tablet Take 100 mg by mouth daily.    Marland Kitchen telmisartan (MICARDIS) 20 MG tablet Take 1 tablet (20 mg total) by mouth daily. 90 tablet 3  . topiramate (TOPAMAX) 100 MG tablet Take 100 mg by mouth 2 (two) times daily.    . traZODone (DESYREL) 150 MG tablet Take 1 tablet by mouth daily.     No current facility-administered medications for this visit.     Past Surgical History:  Procedure Laterality Date  . TOTAL ABDOMINAL HYSTERECTOMY  2015     Allergies  Allergen Reactions  . Codeine Itching      Family History  Problem Relation Age of Onset  . Hypertension Mother   . Thyroid disease Mother        hypothyroidism  . Thyroid cancer Cousin   . Asthma Father   . Heart disease Father          from scarlet fever; died age 32 MVA  . Hypertension Sister   . Hypothyroidism Sister   . Hypertension Maternal Grandmother   . Heart failure Maternal Grandmother   . Diabetes Paternal Grandmother   . Heart failure Paternal Grandmother      Social History Stephanie Ewing reports that she has been smoking cigarettes. She started smoking about 48 years ago. She has been smoking about 0.50 packs per day. She has never used smokeless tobacco. Stephanie Ewing reports previous alcohol use.   Review of Systems CONSTITUTIONAL: No weight loss, fever, chills, weakness or fatigue.  HEENT: Eyes: No visual loss, blurred vision, double vision or yellow sclerae.No hearing loss, sneezing, congestion, runny nose or sore throat.  SKIN: No rash or itching.  CARDIOVASCULAR: per hpi RESPIRATORY: No shortness of breath, cough or sputum.  GASTROINTESTINAL: No anorexia, nausea, vomiting or diarrhea. No abdominal pain or blood.  GENITOURINARY: No burning on urination, no polyuria NEUROLOGICAL: No headache, dizziness, syncope, paralysis, ataxia, numbness or tingling in the extremities. No change in bowel or bladder control.  MUSCULOSKELETAL: No muscle, back pain, joint pain or stiffness.  LYMPHATICS: No enlarged nodes. No history of splenectomy.  PSYCHIATRIC: No history of depression or anxiety.  ENDOCRINOLOGIC: No reports of sweating, cold or heat intolerance. No polyuria or polydipsia.  Marland Kitchen   Physical Examination Today's Vitals   12/08/19 1344  BP: 114/68  Pulse: 74  SpO2: 97%  Weight: 178 lb 12.8 oz (81.1 kg)  Height: 5\' 7"  (1.702 m)   Body mass index is 28 kg/m.  Gen: resting comfortably, no acute distress HEENT: no scleral icterus, pupils equal round and reactive, no palptable cervical adenopathy,  CV: RRR, no m/r/g, no jvd Resp: Clear to auscultation bilaterally GI: abdomen is soft, non-tender, non-distended, normal bowel sounds, no hepatosplenomegaly MSK: extremities are warm, no edema.   Skin: warm, no rash Neuro:  no focal deficits Psych: appropriate affect   Diagnostic Studies 01/2017 Nuclear perfusion stress test: 1. Normal myocardial perfusion imaging without ischemia or infarction. 2. Normal post-stress LVEF, 55%. Normal regional wall motion. TID 0.93. 3. Patient achieved 10 METs by Bruce protocol without chest pain or EKG changes but could not reach heart rate target. With vasodilator administration, no EKG changes. Patient reported chest pain with vasodilator, a non-specific finding. 4. Hypertensive response to exercise. 5. No prior study available for comparison. Electronically signed Dr Hurshel Party, MD 02/06/17 9:35 AM  01/2017 Echocardiogram: Mild sclerosis of the trileaflet aortic valve, with adequate leaflet motion. Mild-to-moderate aortic regurgitation is present. The left atrial size is normal. There  is normal left ventricular size. Global left ventricular systolic function is normal. The estimated left ventricular ejection fraction is 60% Normal left ventricular diastolic filling pattern. Normal right ventricle size and function. Normal RVSP ---------------------------------------------------------------- Electronically signed by Ruel Favors W(Interpreting physician) on 01/18/2017 06:58 PM ----------------------------------------------------------------   02/2017 Thirty day event monitor: 1. Ventricular arrhythmias occasional PVCs with no couplets triplets or runs of ventricular tachycardia 2. Supraventricular arrhythmias occasional PACs with occasional short runs of nonsustained SVT 3. Atrial fibrillation/atrial flutter none 4. Bradycardia arrhythmias no significant Brady arrhythmias 5. Symptom correlation with arrhythmias patient's symptoms did occur with atrial and ventricular ectopy and short runs of nonsustained SVT  Impressions: Occasional PVCs with no high-grade ventricular ectopy Occasional PACs with occasional brief short runs of  nonsustained SVT Symptoms occurred with PACs and PVCs and short runs of nonsustained SVT Overall no hemodynamically significant arrhythmias present  Electronically signed by: Holly Bodily, DO, FACC    Assessment and Plan  1. PAF - 2 isolated episodes of afib in setting of severe hypokalemia. No clear recurence, including by a 30 day event monitor by her previous cardiologist.  - . With recent afib guidelines changing women with CHADS2Vasc score of 2 to a IIB recommendation, and no clear recurrence reasonable to d/c eliquis at this time.   - continue to monitor at this time.   2. Resistant HTN - significnatly improved on aldactone, she was diagnosed with hyperaldo - at goal, continue current meds  3. Aortic regurgitation - moderate by recent echo, continue to monitor.       Arnoldo Lenis, M.D.

## 2019-12-08 NOTE — Patient Instructions (Addendum)

## 2020-04-13 ENCOUNTER — Inpatient Hospital Stay (HOSPITAL_COMMUNITY)
Admission: EM | Admit: 2020-04-13 | Discharge: 2020-04-15 | DRG: 440 | Disposition: A | Discharge status: Home / Self Care | Payer: Medicare Other | Attending: Family Medicine | Admitting: Family Medicine

## 2020-04-13 ENCOUNTER — Encounter (HOSPITAL_COMMUNITY): Payer: Self-pay | Admitting: Emergency Medicine

## 2020-04-13 ENCOUNTER — Observation Stay (HOSPITAL_COMMUNITY): Payer: Medicare Other

## 2020-04-13 ENCOUNTER — Emergency Department (HOSPITAL_COMMUNITY): Payer: Medicare Other

## 2020-04-13 DIAGNOSIS — Z79890 Hormone replacement therapy (postmenopausal): Secondary | ICD-10-CM

## 2020-04-13 DIAGNOSIS — E669 Obesity, unspecified: Secondary | ICD-10-CM | POA: Diagnosis present

## 2020-04-13 DIAGNOSIS — I480 Paroxysmal atrial fibrillation: Secondary | ICD-10-CM | POA: Diagnosis present

## 2020-04-13 DIAGNOSIS — J45909 Unspecified asthma, uncomplicated: Secondary | ICD-10-CM | POA: Diagnosis present

## 2020-04-13 DIAGNOSIS — Z8349 Family history of other endocrine, nutritional and metabolic diseases: Secondary | ICD-10-CM

## 2020-04-13 DIAGNOSIS — L299 Pruritus, unspecified: Secondary | ICD-10-CM | POA: Diagnosis not present

## 2020-04-13 DIAGNOSIS — F32A Depression, unspecified: Secondary | ICD-10-CM | POA: Diagnosis present

## 2020-04-13 DIAGNOSIS — F5104 Psychophysiologic insomnia: Secondary | ICD-10-CM | POA: Diagnosis present

## 2020-04-13 DIAGNOSIS — Z833 Family history of diabetes mellitus: Secondary | ICD-10-CM

## 2020-04-13 DIAGNOSIS — K8500 Idiopathic acute pancreatitis without necrosis or infection: Secondary | ICD-10-CM | POA: Diagnosis not present

## 2020-04-13 DIAGNOSIS — K859 Acute pancreatitis without necrosis or infection, unspecified: Secondary | ICD-10-CM

## 2020-04-13 DIAGNOSIS — Z825 Family history of asthma and other chronic lower respiratory diseases: Secondary | ICD-10-CM

## 2020-04-13 DIAGNOSIS — I129 Hypertensive chronic kidney disease with stage 1 through stage 4 chronic kidney disease, or unspecified chronic kidney disease: Secondary | ICD-10-CM | POA: Diagnosis present

## 2020-04-13 DIAGNOSIS — Z885 Allergy status to narcotic agent status: Secondary | ICD-10-CM

## 2020-04-13 DIAGNOSIS — T402X5A Adverse effect of other opioids, initial encounter: Secondary | ICD-10-CM | POA: Diagnosis not present

## 2020-04-13 DIAGNOSIS — E2609 Other primary hyperaldosteronism: Secondary | ICD-10-CM | POA: Diagnosis present

## 2020-04-13 DIAGNOSIS — Z20822 Contact with and (suspected) exposure to covid-19: Secondary | ICD-10-CM | POA: Diagnosis present

## 2020-04-13 DIAGNOSIS — Z8249 Family history of ischemic heart disease and other diseases of the circulatory system: Secondary | ICD-10-CM

## 2020-04-13 DIAGNOSIS — Z6827 Body mass index (BMI) 27.0-27.9, adult: Secondary | ICD-10-CM

## 2020-04-13 DIAGNOSIS — Z23 Encounter for immunization: Secondary | ICD-10-CM

## 2020-04-13 DIAGNOSIS — Z800 Family history of malignant neoplasm of digestive organs: Secondary | ICD-10-CM

## 2020-04-13 DIAGNOSIS — L4050 Arthropathic psoriasis, unspecified: Secondary | ICD-10-CM | POA: Diagnosis present

## 2020-04-13 DIAGNOSIS — Z79899 Other long term (current) drug therapy: Secondary | ICD-10-CM

## 2020-04-13 DIAGNOSIS — E063 Autoimmune thyroiditis: Secondary | ICD-10-CM | POA: Diagnosis present

## 2020-04-13 DIAGNOSIS — Z90710 Acquired absence of both cervix and uterus: Secondary | ICD-10-CM

## 2020-04-13 DIAGNOSIS — Z515 Encounter for palliative care: Secondary | ICD-10-CM

## 2020-04-13 DIAGNOSIS — Z808 Family history of malignant neoplasm of other organs or systems: Secondary | ICD-10-CM

## 2020-04-13 DIAGNOSIS — R7303 Prediabetes: Secondary | ICD-10-CM | POA: Diagnosis present

## 2020-04-13 DIAGNOSIS — Z7982 Long term (current) use of aspirin: Secondary | ICD-10-CM

## 2020-04-13 DIAGNOSIS — N1831 Chronic kidney disease, stage 3a: Secondary | ICD-10-CM | POA: Diagnosis present

## 2020-04-13 DIAGNOSIS — F17210 Nicotine dependence, cigarettes, uncomplicated: Secondary | ICD-10-CM | POA: Diagnosis present

## 2020-04-13 LAB — LIPID PANEL
Cholesterol: 156 mg/dL (ref 0–200)
HDL: 57 mg/dL (ref >40)
LDL Cholesterol: 79 mg/dL (ref 0–99)
Total CHOL/HDL Ratio: 2.7 RATIO
Triglycerides: 99 mg/dL (ref <150)
VLDL: 20 mg/dL (ref 0–40)

## 2020-04-13 LAB — COMPREHENSIVE METABOLIC PANEL
ALT: 29 U/L (ref 0–44)
AST: 19 U/L (ref 15–41)
Albumin: 4 g/dL (ref 3.5–5.0)
Alkaline Phosphatase: 68 U/L (ref 38–126)
Anion gap: 13 (ref 5–15)
BUN: 21 mg/dL (ref 8–23)
CO2: 24 mmol/L (ref 22–32)
Calcium: 10.1 mg/dL (ref 8.9–10.3)
Chloride: 102 mmol/L (ref 98–111)
Creatinine, Ser: 1.21 mg/dL — ABNORMAL HIGH (ref 0.44–1.00)
GFR, Estimated: 50 mL/min — ABNORMAL LOW (ref >60)
Glucose, Bld: 105 mg/dL — ABNORMAL HIGH (ref 70–99)
Potassium: 4.4 mmol/L (ref 3.5–5.1)
Sodium: 139 mmol/L (ref 135–145)
Total Bilirubin: 0.6 mg/dL (ref 0.3–1.2)
Total Protein: 7.5 g/dL (ref 6.5–8.1)

## 2020-04-13 LAB — URINALYSIS, ROUTINE W REFLEX MICROSCOPIC
Bilirubin Urine: NEGATIVE
Glucose, UA: NEGATIVE mg/dL
Ketones, ur: 5 mg/dL — AB
Nitrite: NEGATIVE
Protein, ur: NEGATIVE mg/dL
Specific Gravity, Urine: 1.009 (ref 1.005–1.030)
pH: 5 (ref 5.0–8.0)

## 2020-04-13 LAB — RESP PANEL BY RT-PCR (FLU A&B, COVID) ARPGX2
Influenza A by PCR: NEGATIVE
Influenza B by PCR: NEGATIVE
SARS Coronavirus 2 by RT PCR: NEGATIVE

## 2020-04-13 LAB — CBC
HCT: 46.1 % — ABNORMAL HIGH (ref 36.0–46.0)
Hemoglobin: 15 g/dL (ref 12.0–15.0)
MCH: 30.7 pg (ref 26.0–34.0)
MCHC: 32.5 g/dL (ref 30.0–36.0)
MCV: 94.3 fL (ref 80.0–100.0)
Platelets: 287 10*3/uL (ref 150–400)
RBC: 4.89 MIL/uL (ref 3.87–5.11)
RDW: 13.4 % (ref 11.5–15.5)
WBC: 9.4 10*3/uL (ref 4.0–10.5)
nRBC: 0 % (ref 0.0–0.2)

## 2020-04-13 LAB — LIPASE, BLOOD: Lipase: 46 U/L (ref 11–51)

## 2020-04-13 IMAGING — CR DG CHEST 2V
2 series · 2 of 2 positions shown · non-contrast
Comparison: None.

CLINICAL DATA: Cough, LEFT upper quadrant pain since last week
which has worsened over past day

EXAM:
None [SY] VIEW

[chest pa]
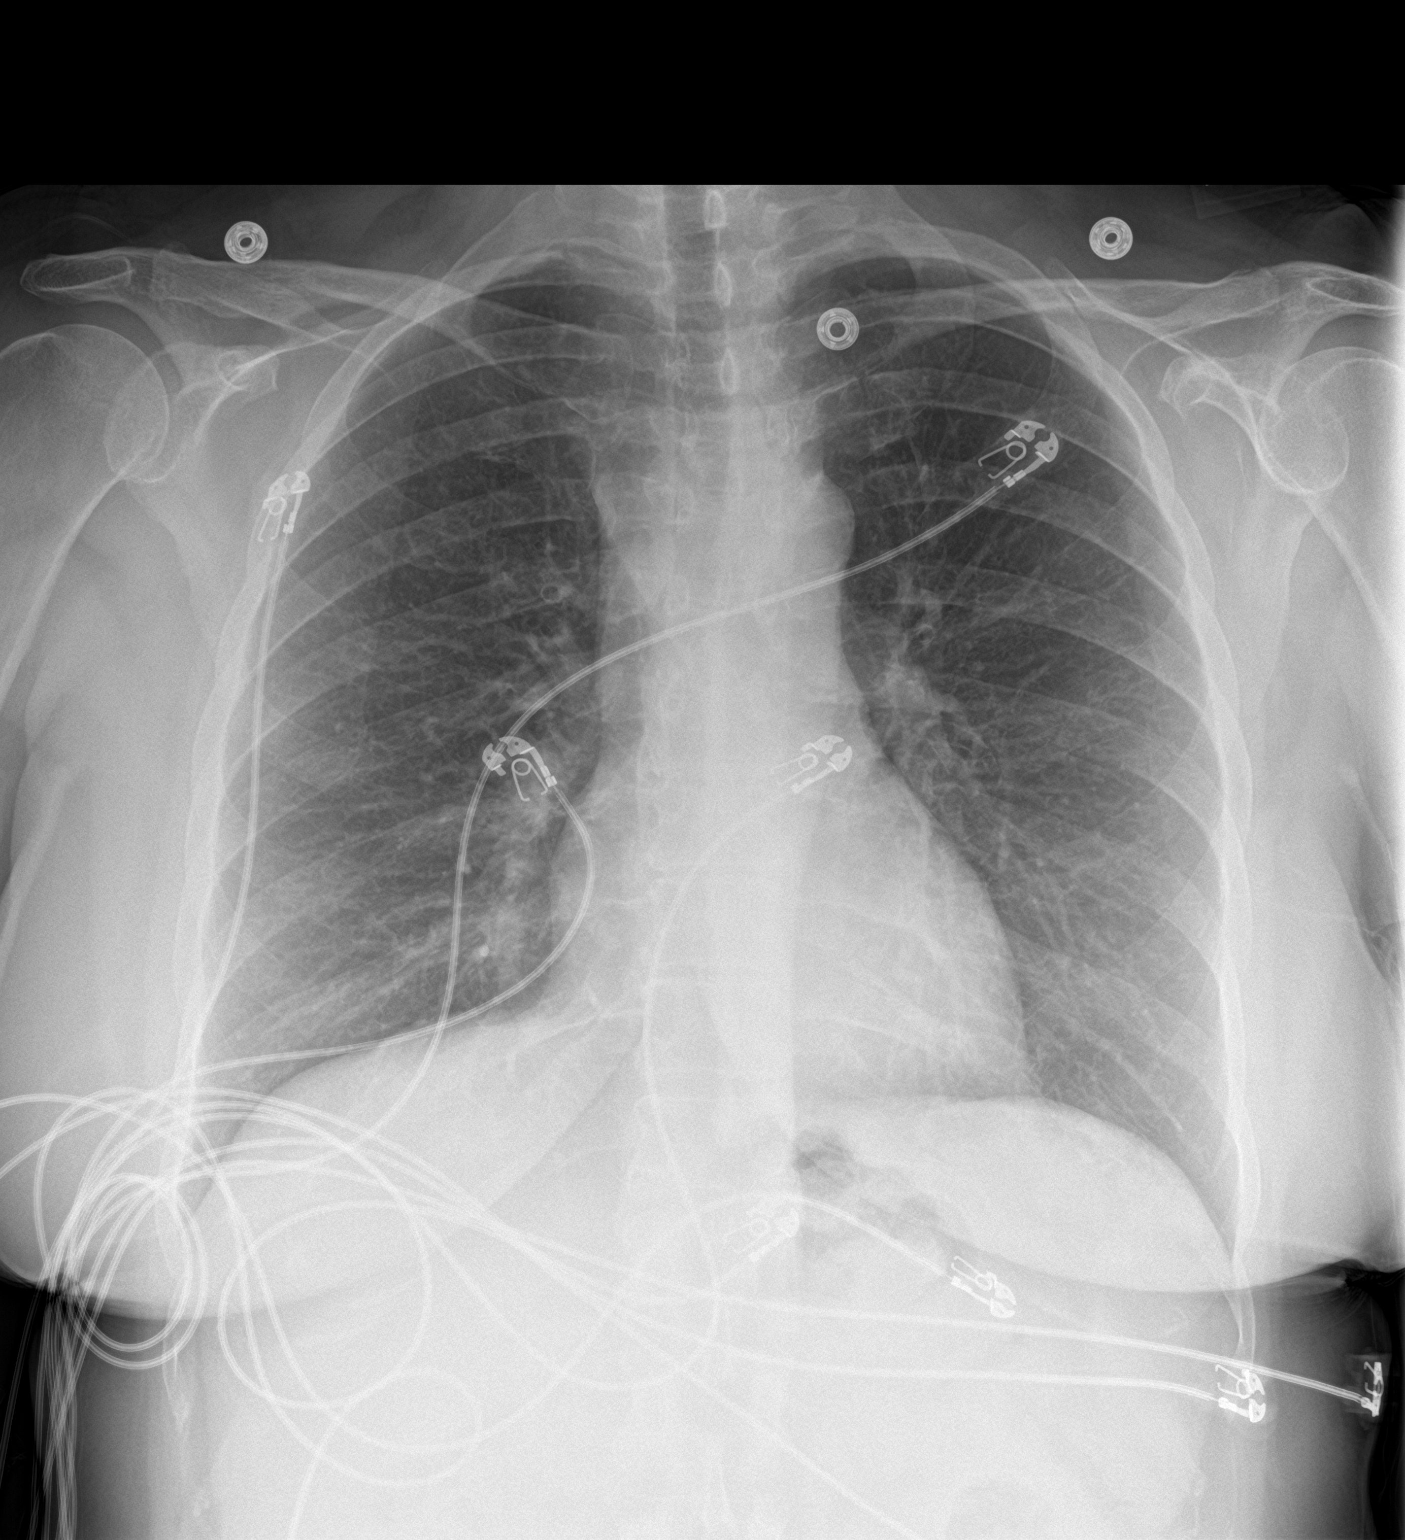

[chest lat]
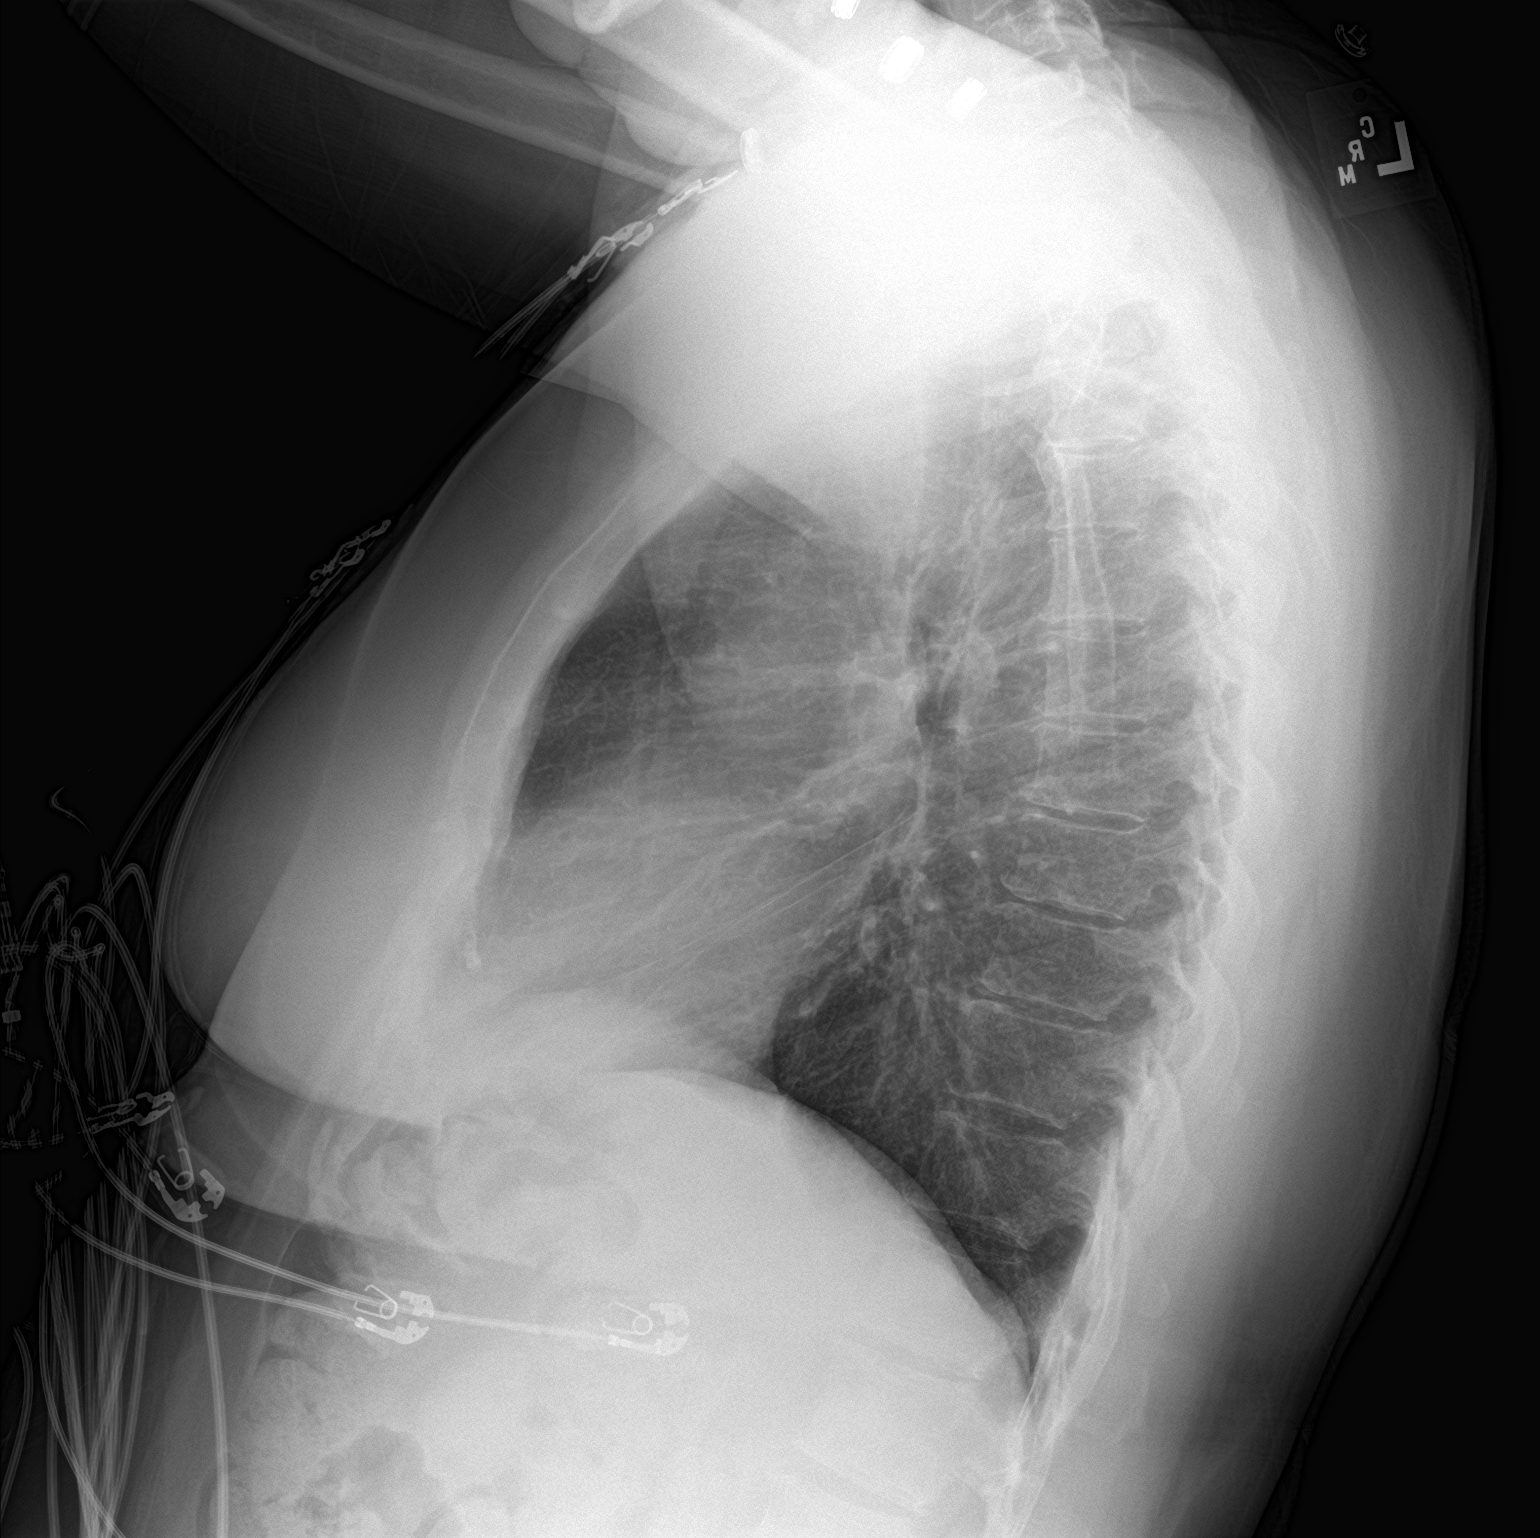

[2 of 2 positions shown; findings below may reference images not displayed]

FINDINGS: Normal heart size, mediastinal contours, and pulmonary vascularity.

Lungs clear.

Central peribronchial thickening present.

No infiltrate, pleural effusion or pneumothorax.

Osseous structures unremarkable.
IMPRESSION: Bronchitic changes without infiltrate.

## 2020-04-13 IMAGING — CT CT ABD-PELV W/ CM
2 of 5 series · 17 of 46 positions shown, 19 images · IV contrast (APPLIED)
Comparison: None.

CLINICAL DATA: Left upper quadrant pain since last week.

EXAM:
CT ABDOMEN AND PELVIS WITH CONTRAST
TECHNIQUE: Multidetector CT imaging of the abdomen and pelvis was performed
using the standard protocol following bolus administration of
intravenous contrast.
CONTRAST:  100mL OMNIPAQUE IOHEXOL 300 MG/ML  SOLN

[Series 4: abd/ pelvis 5.0 i30f 2 · axial · 0.79mm/px · z∈[+845,+1255]mm · 14 of 94 slices shown, 16 images]
[im 6/94  soft-tissue]
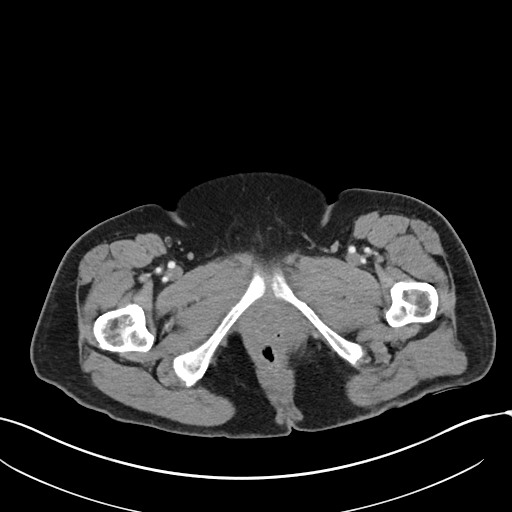
[im 6/94  bone]
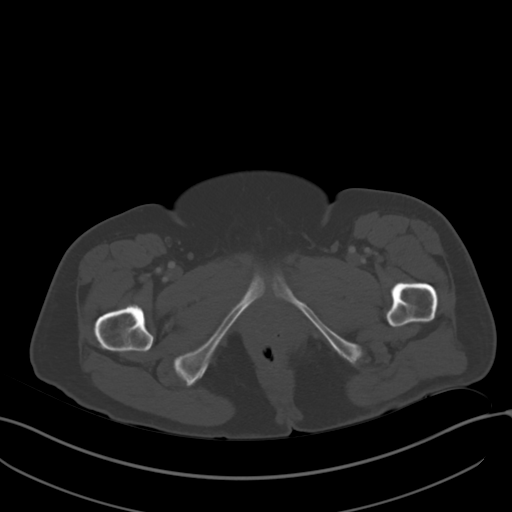
[im 11/94  soft-tissue]
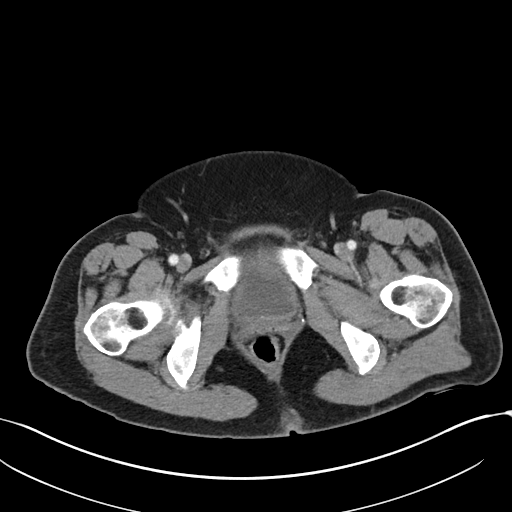
[im 21/94  soft-tissue]
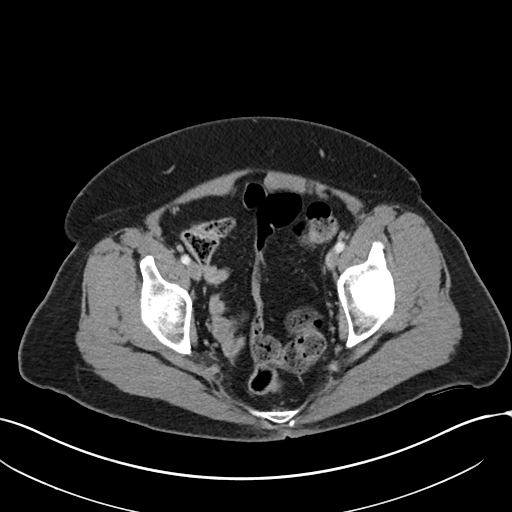
[im 26/94  soft-tissue]
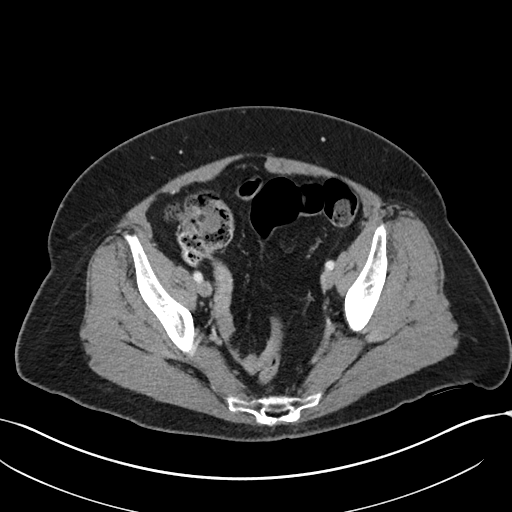
[im 32/94  soft-tissue]
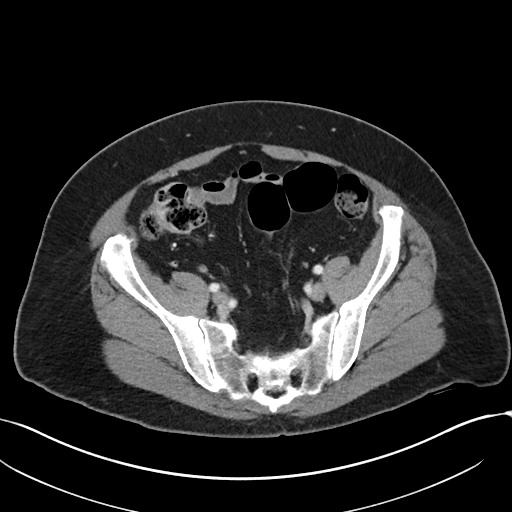
[im 37/94  soft-tissue]
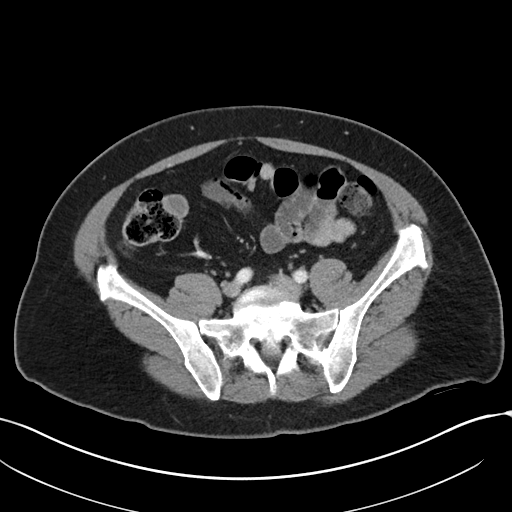
[im 42/94  soft-tissue]
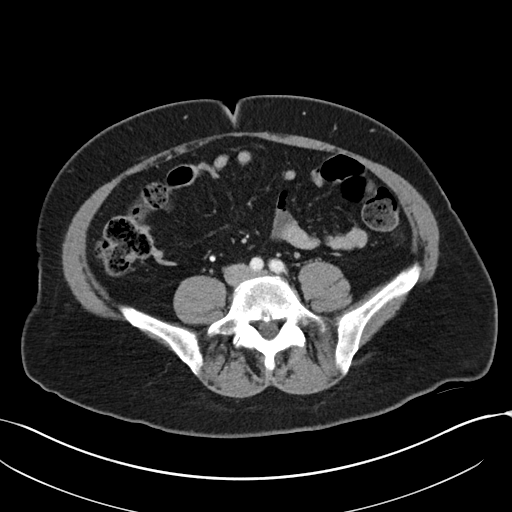
[im 52/94  soft-tissue]
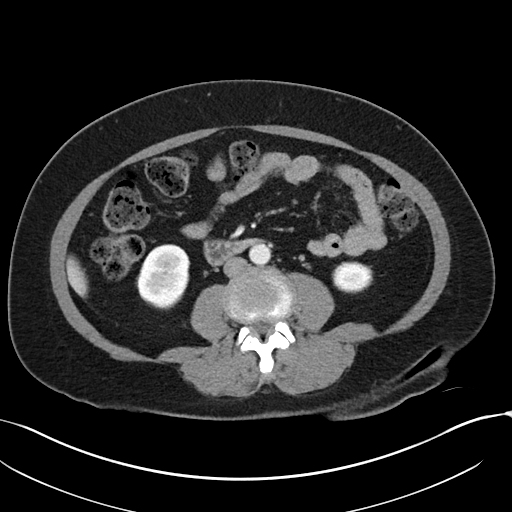
[im 57/94  soft-tissue]
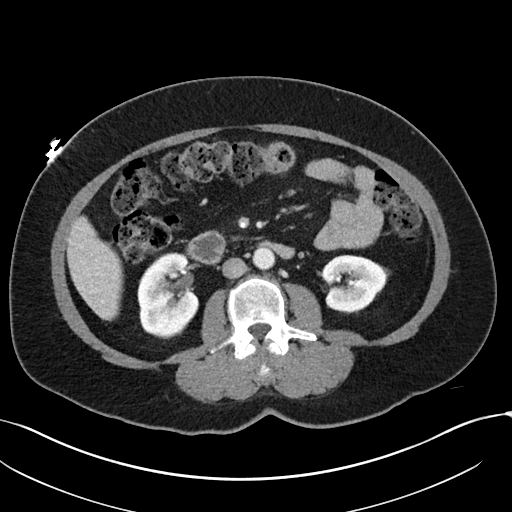
[im 57/94  bone]
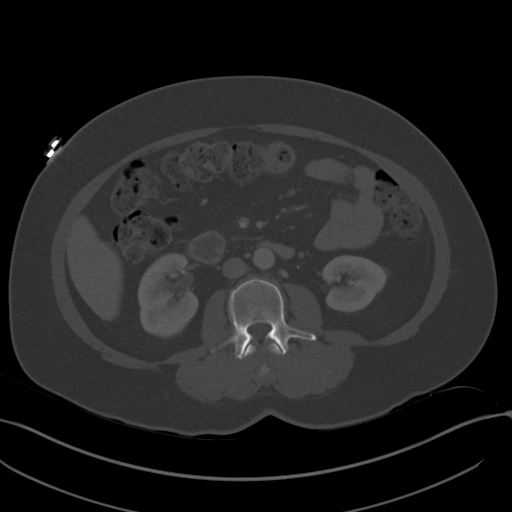
[im 63/94  soft-tissue]
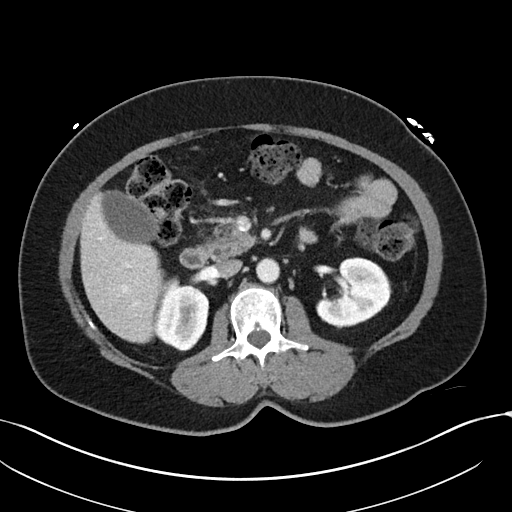
[im 68/94  soft-tissue]
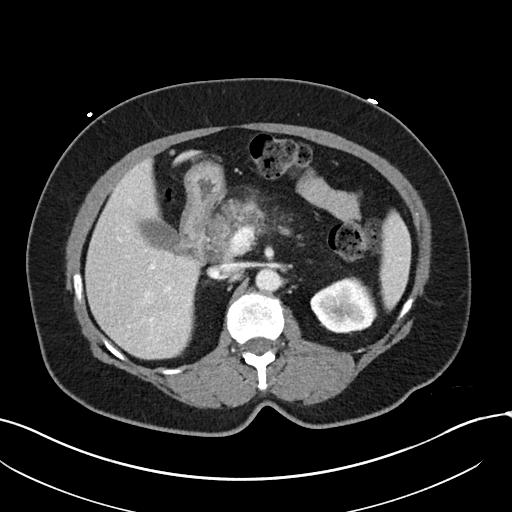
[im 73/94  soft-tissue]
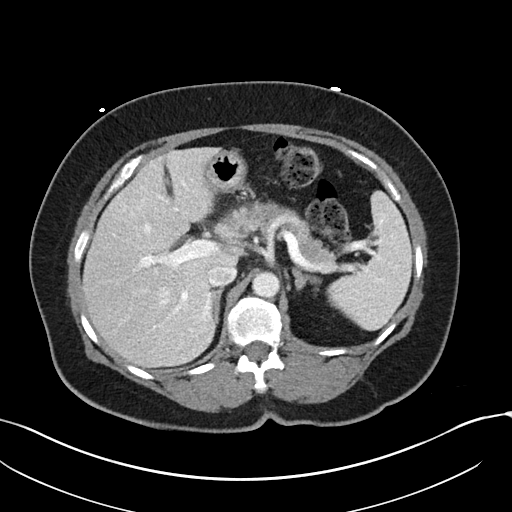
[im 83/94  soft-tissue]
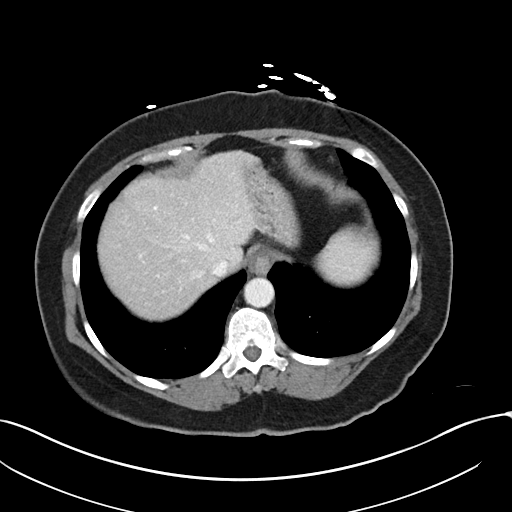
[im 88/94  soft-tissue]
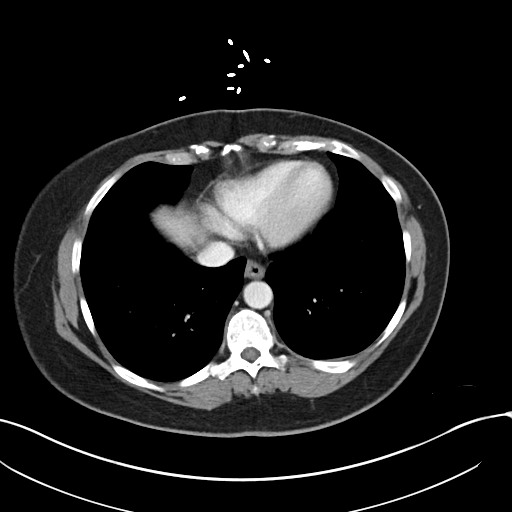

[Series 7: coronal soft tissue · coronal · 0.91mm/px · 3 of 101 slices shown]
[im 34/101  soft-tissue]
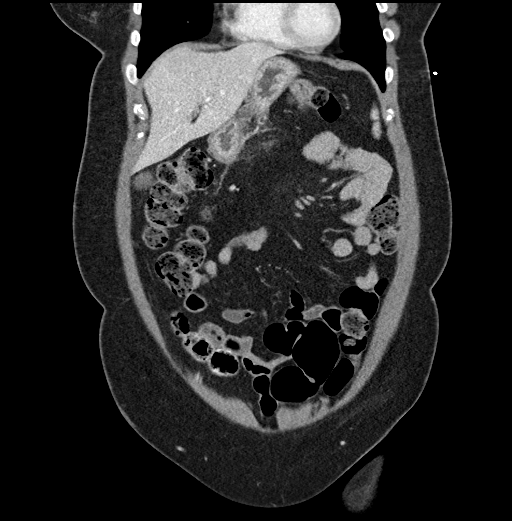
[im 45/101  soft-tissue]
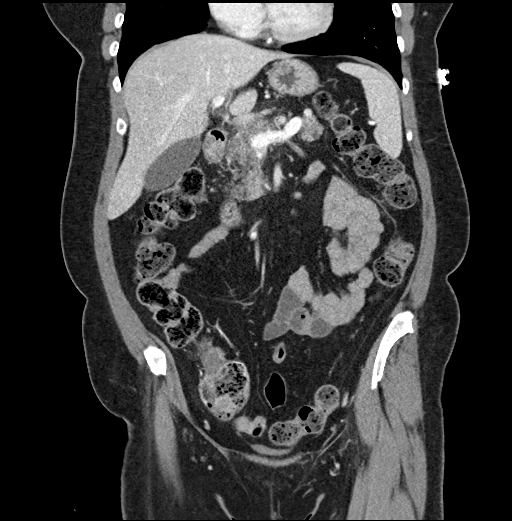
[im 56/101  soft-tissue]
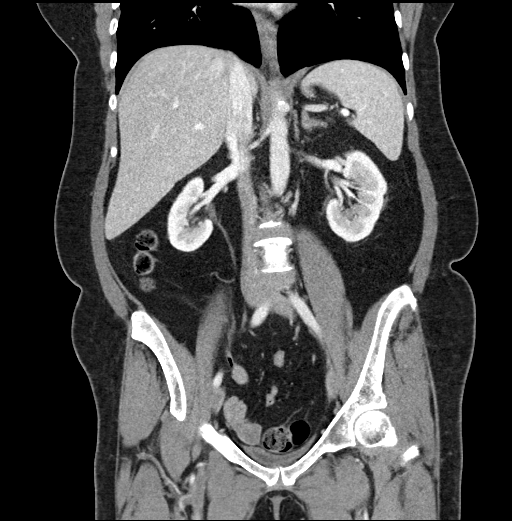

[17 of 46 positions shown; findings below may reference images not displayed]

FINDINGS: Lower chest: No acute abnormality.

Hepatobiliary: No focal liver abnormality is seen. No gallstones,
gallbladder wall thickening, or biliary dilatation.

Pancreas: Ill-defined, edematous pancreatic head and proximal body
with mild peripancreatic fat stranding. No ductal dilatation.

Spleen: Normal in size without focal abnormality.

Adrenals/Urinary Tract: Adrenal glands are unremarkable. Kidneys are
normal, without renal calculi, focal lesion, or hydronephrosis.
Bladder is unremarkable.

Stomach/Bowel: Stomach is within normal limits. Appendix appears
normal. No evidence of bowel wall thickening, distention, or
inflammatory changes.

Vascular/Lymphatic: No significant vascular findings are present. No
enlarged abdominal or pelvic lymph nodes.

Reproductive: Status post hysterectomy. No adnexal masses.

Other: No free fluid or pneumoperitoneum.

Musculoskeletal: No acute or significant osseous findings.
IMPRESSION: 1. Ill-defined, edematous pancreatic head and proximal body with
mild peripancreatic fat stranding, consistent with acute
pancreatitis.

## 2020-04-13 IMAGING — US US ABDOMEN LIMITED
1 series · 14 of 25 positions shown · non-contrast
Comparison: [DATE]

CLINICAL DATA: Acute pancreatitis, epigastric pain

EXAM:
ULTRASOUND ABDOMEN LIMITED RIGHT UPPER QUADRANT

[Series 1: us abdomen limited ruq (liver/gb) · 14 of 28 slices shown]
[im 1/28]
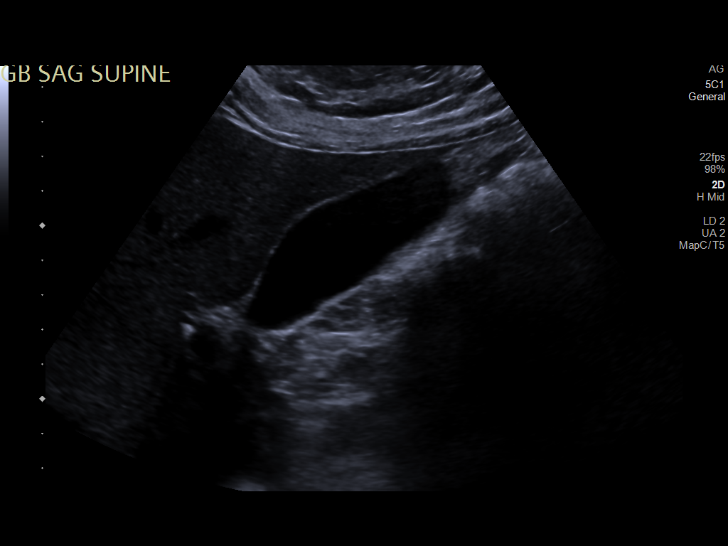
[im 3/28]
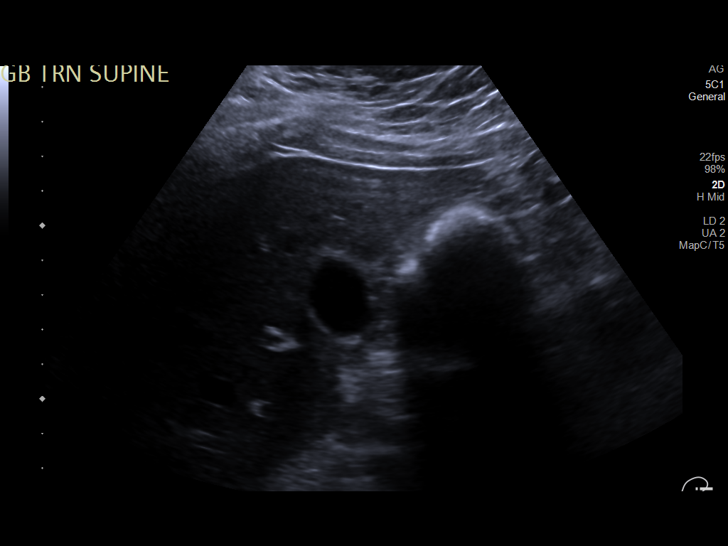
[im 5/28]
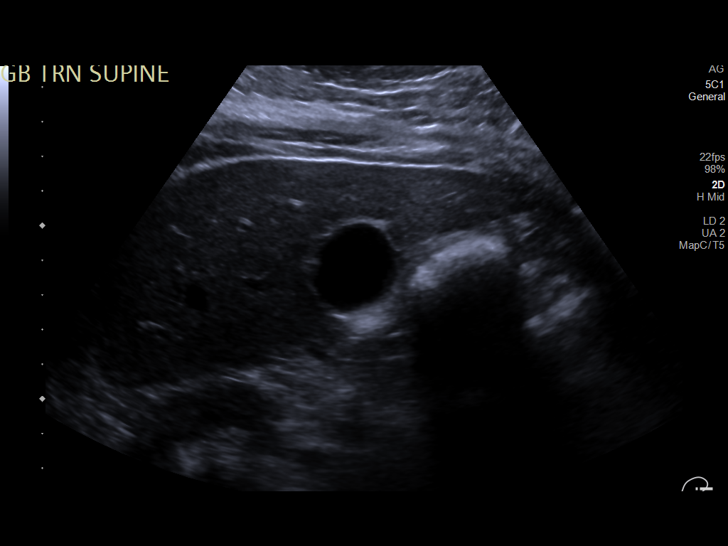
[im 7/28]
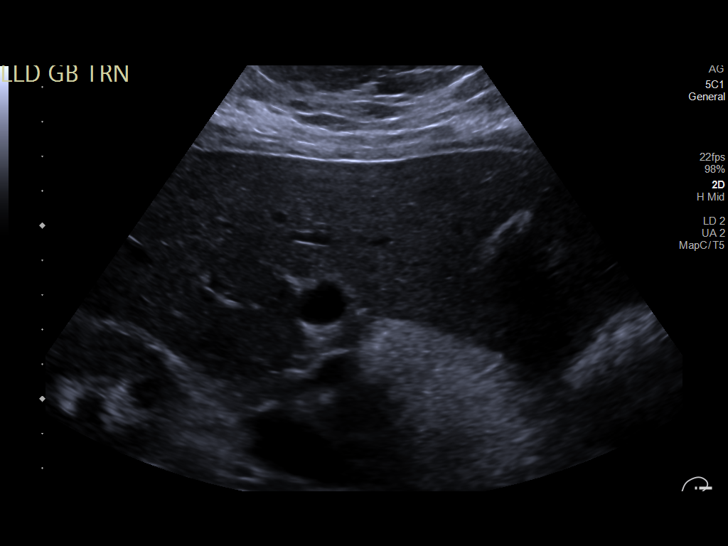
[im 10/28]
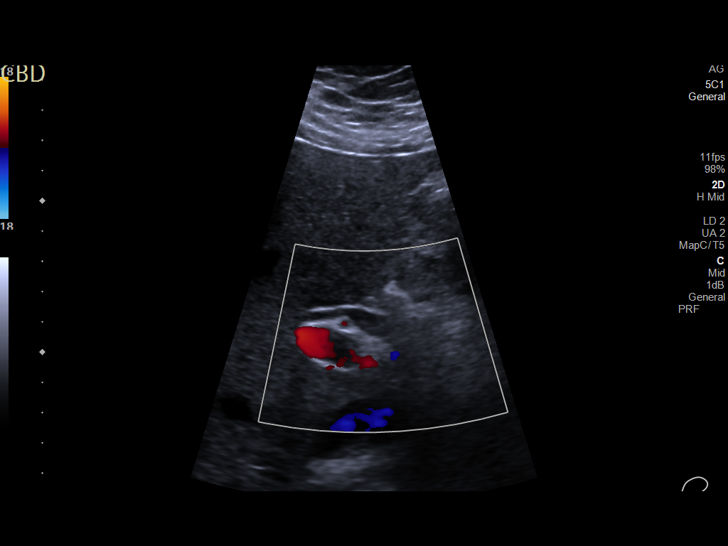
[im 11/28]
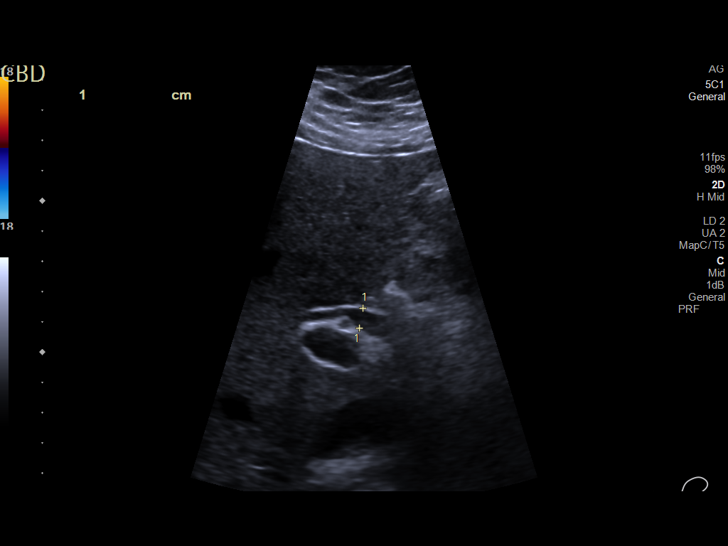
[im 13/28]
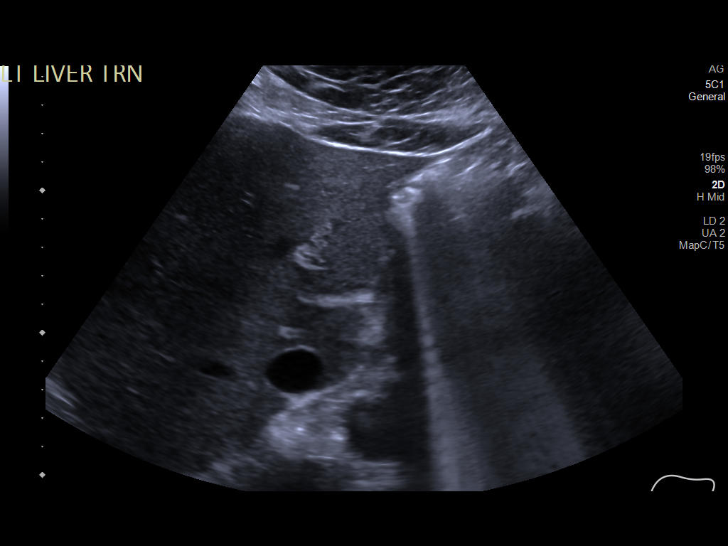
[im 15/28]
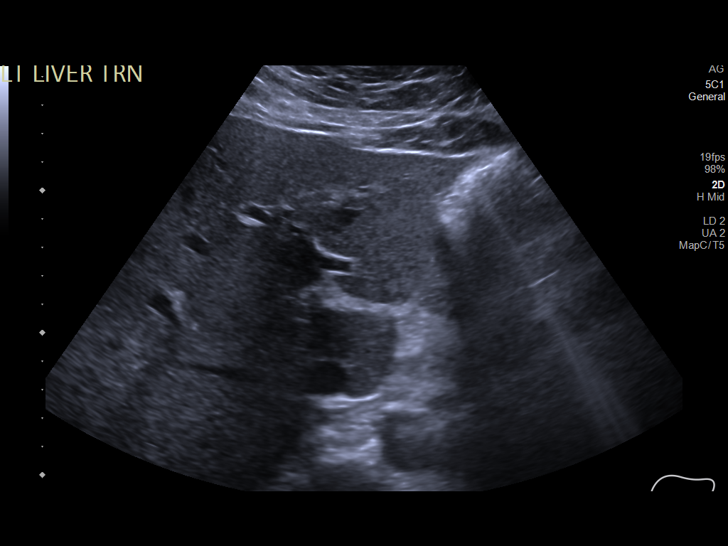
[im 17/28]
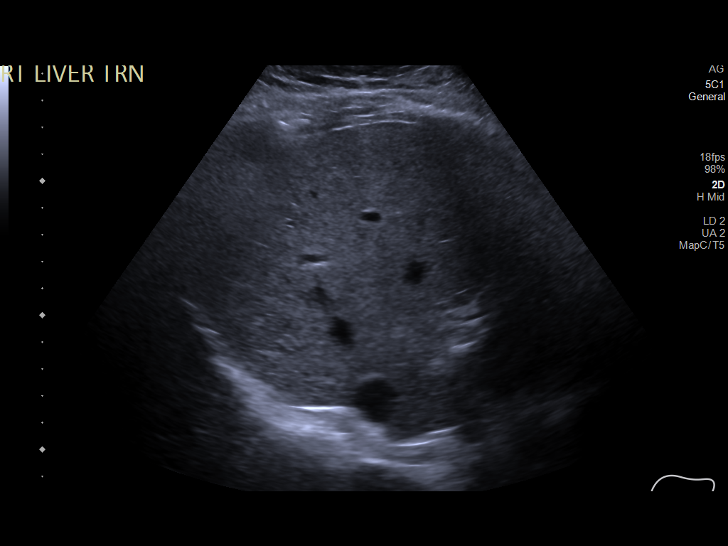
[im 19/28]
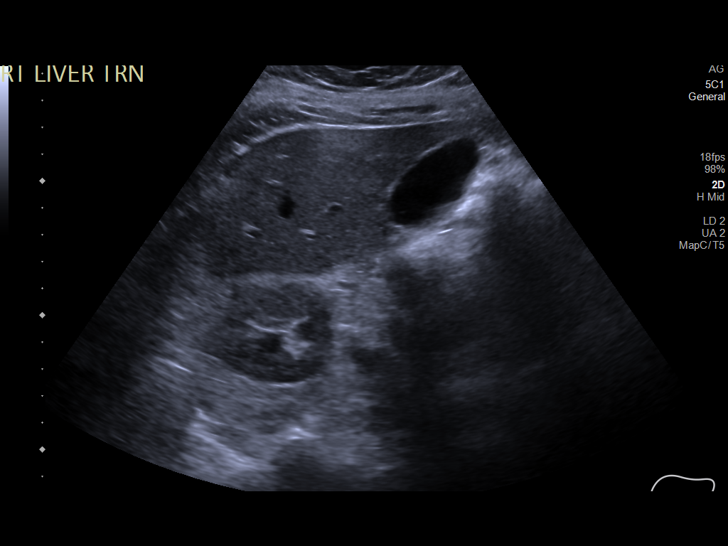
[im 21/28]
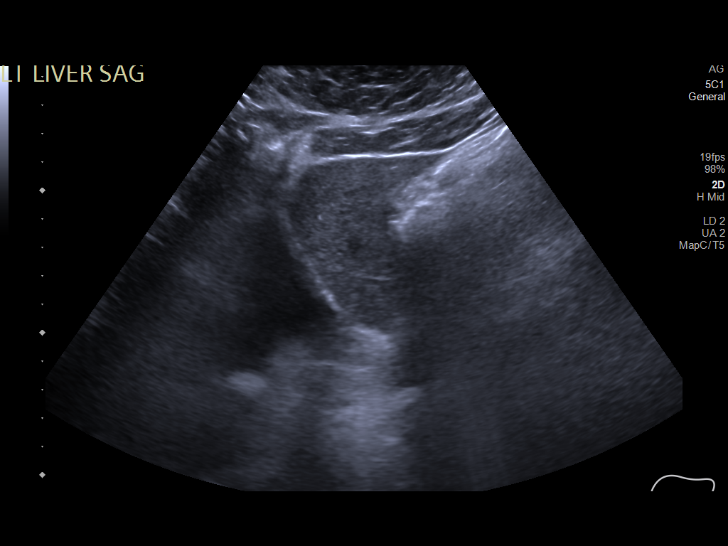
[im 23/28]
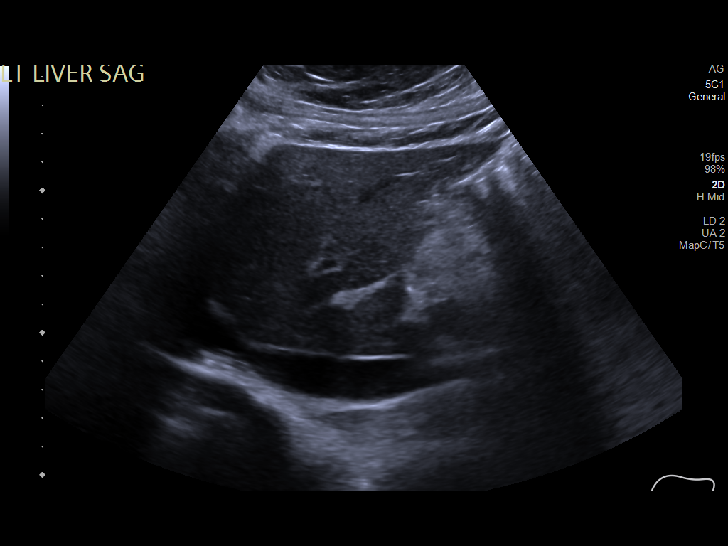
[im 25/28]
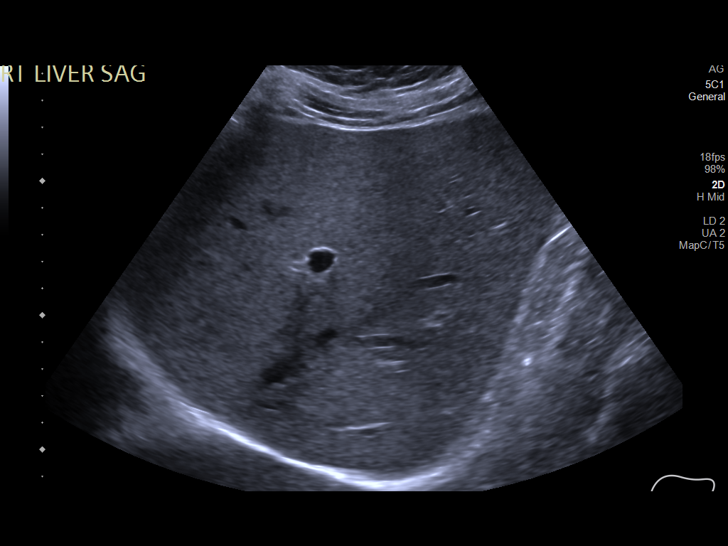
[im 28/28]
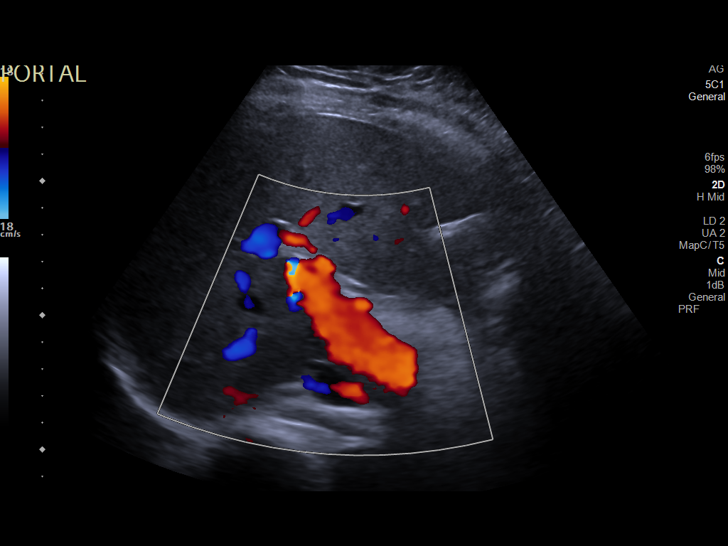

[14 of 25 positions shown; findings below may reference images not displayed]

FINDINGS: Gallbladder:

No gallstones or wall thickening visualized. No sonographic Murphy
sign noted by sonographer.

Common bile duct:

Diameter: 7 mm

Liver:

No focal lesion identified. Within normal limits in parenchymal
echogenicity. Portal vein is patent on color Doppler imaging with
normal direction of blood flow towards the liver.

Other: None.
IMPRESSION: 1. Unremarkable right upper quadrant ultrasound. Please refer to
preceding CT report describing acute uncomplicated pancreatitis.

## 2020-04-13 MED ORDER — TRAZODONE HCL 150 MG PO TABS
150.0000 mg | ORAL_TABLET | Freq: Every day | ORAL | Status: DC
  Administered 2020-04-13 – 2020-04-14 (×2): 150 mg via ORAL
  Filled 2020-04-13 (×2): qty 1

## 2020-04-13 MED ORDER — FENTANYL CITRATE (PF) 100 MCG/2ML IJ SOLN
25.0000 ug | INTRAMUSCULAR | Status: DC | PRN
  Administered 2020-04-13 – 2020-04-14 (×5): 25 ug via INTRAVENOUS
  Filled 2020-04-13 (×5): qty 2

## 2020-04-13 MED ORDER — LACTATED RINGERS IV SOLN: INTRAVENOUS | Status: DC

## 2020-04-13 MED ORDER — FENTANYL CITRATE (PF) 100 MCG/2ML IJ SOLN
25.0000 ug | Freq: Once | INTRAMUSCULAR | Status: AC
  Administered 2020-04-13: 25 ug via INTRAVENOUS
  Filled 2020-04-13: qty 2

## 2020-04-13 MED ORDER — CYCLOSPORINE 0.05 % OP EMUL
1.0000 [drp] | Freq: Two times a day (BID) | OPHTHALMIC | Status: DC
  Administered 2020-04-13 – 2020-04-15 (×4): 1 [drp] via OPHTHALMIC
  Filled 2020-04-13 (×6): qty 1

## 2020-04-13 MED ORDER — GABAPENTIN 300 MG PO CAPS
300.0000 mg | ORAL_CAPSULE | Freq: Three times a day (TID) | ORAL | Status: DC
  Administered 2020-04-13 – 2020-04-15 (×6): 300 mg via ORAL
  Filled 2020-04-13 (×6): qty 1

## 2020-04-13 MED ORDER — LACTATED RINGERS IV BOLUS
1000.0000 mL | Freq: Once | INTRAVENOUS | Status: AC
  Administered 2020-04-13: 1000 mL via INTRAVENOUS

## 2020-04-13 MED ORDER — ENOXAPARIN SODIUM 40 MG/0.4ML ~~LOC~~ SOLN
40.0000 mg | SUBCUTANEOUS | Status: DC
  Administered 2020-04-13 – 2020-04-14 (×2): 40 mg via SUBCUTANEOUS
  Filled 2020-04-13 (×2): qty 0.4

## 2020-04-13 MED ORDER — IOHEXOL 300 MG/ML  SOLN
100.0000 mL | Freq: Once | INTRAMUSCULAR | Status: AC | PRN
  Administered 2020-04-13: 100 mL via INTRAVENOUS

## 2020-04-13 MED ORDER — ONDANSETRON HCL 4 MG/2ML IJ SOLN
4.0000 mg | Freq: Once | INTRAMUSCULAR | Status: AC
  Administered 2020-04-13: 4 mg via INTRAVENOUS
  Filled 2020-04-13: qty 2

## 2020-04-13 MED ORDER — SODIUM CHLORIDE 0.9 % IV SOLN: Freq: Once | INTRAVENOUS | Status: DC

## 2020-04-13 MED ORDER — NICOTINE 7 MG/24HR TD PT24
7.0000 mg | MEDICATED_PATCH | Freq: Every day | TRANSDERMAL | Status: DC | PRN
  Filled 2020-04-13: qty 1

## 2020-04-13 MED ORDER — SPIRONOLACTONE 100 MG PO TABS
100.0000 mg | ORAL_TABLET | Freq: Every day | ORAL | Status: DC
  Administered 2020-04-13: 100 mg via ORAL
  Filled 2020-04-13: qty 1

## 2020-04-13 MED ORDER — FLEET ENEMA 7-19 GM/118ML RE ENEM: 1.0000 | ENEMA | Freq: Once | RECTAL | Status: DC | PRN

## 2020-04-13 MED ORDER — CITALOPRAM HYDROBROMIDE 20 MG PO TABS
20.0000 mg | ORAL_TABLET | Freq: Every day | ORAL | Status: DC
  Administered 2020-04-13 – 2020-04-15 (×3): 20 mg via ORAL
  Filled 2020-04-13: qty 1
  Filled 2020-04-13: qty 2
  Filled 2020-04-13 (×2): qty 1

## 2020-04-13 MED ORDER — LEVOTHYROXINE SODIUM 125 MCG PO TABS
125.0000 ug | ORAL_TABLET | Freq: Every day | ORAL | Status: DC
  Administered 2020-04-14 – 2020-04-15 (×2): 125 ug via ORAL
  Filled 2020-04-13 (×2): qty 1

## 2020-04-13 NOTE — ED Provider Notes (Signed)
[facility] Provider Note   CSN: [id number] Arrival date & time: 04/13/20  0935     History Chief Complaint  Patient presents with  . Abdominal Pain    Stephanie Ewing is a 64 y.o. 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 fib, asthma and depression.  Patient presents with a chief complaint of upper abdominal pain.  Patient reports that mild pain started last Thursday the pain has been progressively worsening since then becoming worse in the last 24 hours, patient's pain is constant with intermittent shooting pain, 9/10 on the pain scale, no relief from Pepcid or Pepto-Bismol, pain is exacerbated with deep inhalation.  Patient states the pain is worse on her left side.  Patient reports decreased p.o. intake due to the pain.  Patient with one episode of vomiting in the last 24 hours; no hematemesis or coffee-ground emesis.  No nausea at present. Patient endorses chills, abdominal distension, decreased urination and productive cough.  Denies any fever, chest pain, shortness of breath, hemoptysis, diarrhea, dysuria, hematuria, vaginal bleeding.  Last bowel movement was yesterday, no diarrhea, black tarry stool or hematochezia.   patient endorses tobacco use, smokes 1 pack/day.  Denies any alcohol or illicit drug use.   HPI     Past Medical History:  Diagnosis Date  . Afib ([place])   . Asthma   . Depression   . Hyperaldosteronism ([place])   . Hypertension   . Hypokalemia   . Insomnia   . Migraine   . Nicotine dependence, cigarettes, uncomplicated   . Thyroid disease    hypothyroid    Patient Active Problem List   Diagnosis Date Noted  . Pancreatitis 04/13/2020  . Hypertension     Past Surgical History:  Procedure Laterality Date  . TOTAL ABDOMINAL HYSTERECTOMY  2015     OB History   No obstetric history on file.     Family History  Problem Relation Age of Onset  . Hypertension  Mother   . Thyroid disease Mother        hypothyroidism  . Thyroid cancer Cousin   . Asthma Father   . Heart disease Father        from scarlet fever; died age 31 MVA  . Hypertension Sister   . Hypothyroidism Sister   . Hypertension Maternal Grandmother   . Heart failure Maternal Grandmother   . Diabetes Paternal Grandmother   . Heart failure Paternal Grandmother     Social History   Tobacco Use  . Smoking status: Current Every Day Smoker    Packs/day: 0.50    Types: Cigarettes    Start date: 03/12/1971  . Smokeless tobacco: Never Used  . Tobacco comment: 1 pack daily  Substance Use Topics  . Alcohol use: Not Currently  . Drug use: Never    Home Medications Prior to Admission medications   Medication Sig Start Date End Date Taking? Authorizing Provider  aspirin EC 81 MG tablet Take 81 mg by mouth daily.   Yes [provider]  citalopram (CELEXA) 20 MG tablet Take 20 mg by mouth daily.   Yes [provider]  famotidine (PEPCID) 20 MG tablet Take 20 mg by mouth daily as needed for indigestion.  07/14/19  Yes [provider]  fexofenadine (ALLEGRA) 180 MG tablet Take 180 mg by mouth daily.   Yes [provider]  folic acid (FOLVITE) 1 MG tablet Take 1 mg by mouth daily. 02/16/20  Yes [provider]  gabapentin (NEURONTIN) 300 MG capsule Take 300 mg by mouth 3 (three) times daily.    Yes [provider]  Levothyroxine Sodium 125 MCG CAPS Take 112 mcg by mouth daily before breakfast.    Yes [provider]  meloxicam (MOBIC) 15 MG tablet Take 15 mg by mouth daily as needed for pain (arthritis).  08/19/19  Yes [provider]  methotrexate (RHEUMATREX) 10 MG tablet Take 10 mg by mouth once a week.  03/12/20  Yes [provider]  montelukast (SINGULAIR) 10 MG tablet Take 10 mg by mouth daily.   Yes [provider]  ondansetron (ZOFRAN) 4 MG tablet Take 4 mg by mouth every 8 (eight) hours as needed  for nausea.  03/24/20  Yes [provider]  RESTASIS 0.05 % ophthalmic emulsion 1 drop 2 (two) times daily. 07/29/19  Yes [provider]  spironolactone (ALDACTONE) 100 MG tablet Take 100 mg by mouth daily.   Yes [provider]  traMADol (ULTRAM) 50 MG tablet Take 25 mg by mouth daily as needed for moderate pain.  03/12/20  Yes [provider]  traZODone (DESYREL) 150 MG tablet Take 1 tablet by mouth at bedtime.  12/28/17  Yes [provider]  diltiazem (CARDIZEM) 30 MG tablet Take 1 tablet every 4 hours AS NEEDED for heart rate >100 as long as top blood pressure >100. Patient not taking: Reported on 04/13/2020 09/19/19   Sherran Needs, NP    Allergies    Codeine  Review of Systems   Review of Systems  Constitutional: Positive for appetite change (decreased). Negative for chills and fever.  Eyes: Negative for visual disturbance.  Respiratory: Positive for cough (productive). Negative for shortness of breath.   Cardiovascular: Negative for chest pain and leg swelling.  Gastrointestinal: Positive for abdominal distention, nausea and vomiting. Negative for abdominal pain, blood in stool, constipation and diarrhea.  Genitourinary: Negative for difficulty urinating, dysuria, flank pain, hematuria, vaginal bleeding, vaginal discharge and vaginal pain.  Musculoskeletal: Positive for back pain. Negative for neck pain.  Skin: Negative for color change and rash.  Neurological: Negative for dizziness, syncope, light-headedness and headaches.  Psychiatric/Behavioral: Negative for confusion.    Physical Exam Updated Vital Signs BP (!) 149/80   Pulse 69   Temp 98.4 F (36.9 C) (Oral)   Resp 17   Ht 5' 6.5" (1.689 m)   Wt 79.8 kg   SpO2 100%   BMI 27.98 kg/m   Physical Exam Constitutional:      General: She is not in acute distress.    Appearance: She is obese. She is not ill-appearing, toxic-appearing or diaphoretic.  HENT:     Head:  Normocephalic.  Eyes:     Pupils: Pupils are equal, round, and reactive to light.  Cardiovascular:     Rate and Rhythm: Normal rate and regular rhythm.     Heart sounds: Normal heart sounds.  Pulmonary:     Effort: Pulmonary effort is normal. No respiratory distress.     Breath sounds: Normal breath sounds. No wheezing, rhonchi or rales.  Abdominal:     General: Abdomen is protuberant. Bowel sounds are normal. There is no distension.     Palpations: Abdomen is soft. There is no mass or pulsatile mass.     Tenderness: There is abdominal tenderness in the right upper quadrant and left upper quadrant. There is no right CVA tenderness, left CVA tenderness or rebound.  Skin:  General: Skin is warm and dry.  Neurological:     General: No focal deficit present.     Mental Status: She is alert.  Psychiatric:        Behavior: Behavior is cooperative.     ED Results / Procedures / Treatments   Labs (all labs ordered are listed, but only abnormal results are displayed) Labs Reviewed  COMPREHENSIVE METABOLIC PANEL - Abnormal; Notable for the following components:      Result Value   Glucose, Bld 105 (*)    Creatinine, Ser 1.21 (*)    GFR, Estimated 50 (*)    All other components within normal limits  CBC - Abnormal; Notable for the following components:   HCT 46.1 (*)    All other components within normal limits  URINALYSIS, ROUTINE W REFLEX MICROSCOPIC - Abnormal; Notable for the following components:   Hgb urine dipstick SMALL (*)    Ketones, ur 5 (*)    Leukocytes,Ua LARGE (*)    Bacteria, UA RARE (*)    Non Squamous Epithelial 0-5 (*)    All other components within normal limits  RESP PANEL BY RT-PCR (FLU A&B, COVID) ARPGX2  LIPASE, BLOOD    EKG EKG Interpretation  Date/Time:  Tuesday April 13 2020 09:59:14 EST Ventricular Rate:  80 PR Interval:  142 QRS Duration: 80 QT Interval:  392 QTC Calculation: 452 R Axis:   55 Text Interpretation: Normal sinus rhythm  Normal ECG No significant change since prior 5/21 Confirmed by Aletta Edouard [phone]) on 04/13/2020 10:28:12 AM   Radiology DG Chest 2 View  Result Date: 04/13/2020 CLINICAL DATA:  Cough, LEFT upper quadrant pain since last week which has worsened over past day EXAM: None CHEST-2 VIEW COMPARISON:  None. FINDINGS: Normal heart size, mediastinal contours, and pulmonary vascularity. Lungs clear. Central peribronchial thickening present. No infiltrate, pleural effusion or pneumothorax. Osseous structures unremarkable. IMPRESSION: Bronchitic changes without infiltrate. Electronically Signed   By: Lavonia Dana M.D.   On: 04/13/2020 11:49   CT ABDOMEN PELVIS W CONTRAST  Result Date: 04/13/2020 CLINICAL DATA:  Left upper quadrant pain since last week. EXAM: CT ABDOMEN AND PELVIS WITH CONTRAST TECHNIQUE: Multidetector CT imaging of the abdomen and pelvis was performed using the standard protocol following bolus administration of intravenous contrast. CONTRAST:  12mL OMNIPAQUE IOHEXOL 300 MG/ML  SOLN COMPARISON:  None. FINDINGS: Lower chest: No acute abnormality. Hepatobiliary: No focal liver abnormality is seen. No gallstones, gallbladder wall thickening, or biliary dilatation. Pancreas: Ill-defined, edematous pancreatic head and proximal body with mild peripancreatic fat stranding. No ductal dilatation. Spleen: Normal in size without focal abnormality. Adrenals/Urinary Tract: Adrenal glands are unremarkable. Kidneys are normal, without renal calculi, focal lesion, or hydronephrosis. Bladder is unremarkable. Stomach/Bowel: Stomach is within normal limits. Appendix appears normal. No evidence of bowel wall thickening, distention, or inflammatory changes. Vascular/Lymphatic: No significant vascular findings are present. No enlarged abdominal or pelvic lymph nodes. Reproductive: Status post hysterectomy. No adnexal masses. Other: No free fluid or pneumoperitoneum. Musculoskeletal: No acute or significant osseous  findings. IMPRESSION: 1. Ill-defined, edematous pancreatic head and proximal body with mild peripancreatic fat stranding, consistent with acute pancreatitis. Electronically Signed   By: Titus Dubin M.D.   On: 04/13/2020 12:46    Procedures Procedures (including critical care time)  Medications Ordered in ED Medications  fentaNYL (SUBLIMAZE) injection 25 mcg (has no administration in time range)  fentaNYL (SUBLIMAZE) injection 25 mcg (25 mcg Intravenous Given 04/13/20 1128)  ondansetron (ZOFRAN) injection 4 mg (4 mg  Intravenous Given 04/13/20 1128)  iohexol (OMNIPAQUE) 300 MG/ML solution 100 mL (100 mLs Intravenous Contrast Given 04/13/20 1237)  lactated ringers bolus 1,000 mL (1,000 mLs Intravenous New Bag/Given 04/13/20 1445)    ED Course  I have reviewed the triage vital signs and the nursing notes.  Pertinent labs & imaging results that were available during my care of the patient were reviewed by me and considered in my medical decision making (see chart for details).  Clinical Course as of Apr 14 1539  Tue Apr 14, 4687  6832 64 year old female history of psoriatic arthritis on methotrexate here with a weeks worth of upper abdominal pain.  Soft on exam.  Normal vitals.  Getting lab work and urinalysis, CT imaging.  Disposition per results of testing.   [MB]    Clinical Course User Index [MB] Hayden Rasmussen, MD   MDM Rules/Calculators/A&P                          Alert 64 year old female in no acute distress with a history of hypothyroidism, hypertension, A. fib, asthma and depression.  Patient presents with a chief complaint of upper abdominal pain that started 1 week prior and has been getting progressively worse.  Describes the pain is constant with intermittent shooting pains,9/10, worse with deep inhalation, no relief from Pepcid or Pepto-Bismol.  Patient has more pain to the left upper quadrant and the right.  Reports decreased appetite and p.o. intake.  Patient reports  one episode of vomiting last 24 hours, no hematemesis or coffee-ground.  Patient endorses chills, abdominal distension, decreased urination and productive cough.  Denies any fever, chest pain, shortness of breath, hemoptysis, diarrhea, dysuria, hematuria, vaginal bleeding.  Patient endorses tobacco use, smokes 1 pack/day.  Denies any alcohol or illicit drug use.  Patient has a history of laparoscopic abdominal hysterectomy.  Patient reports that she started methotrexate 6 to 7 weeks prior.  abdomen is nondistended, soft,  tenderness to palpation to upper left and upper right quadrants.    EKG shows normal sinus rhythm Normal sinus rhythm Normal ECG No significant change since prior.  WBC within normal limits however this may be elevated with patient being on methotrexate.   Creatinine elevated at 1.21.  Lipase within normal limits.  UA shows leukocytes large but nitrate negative and rare bacteria, UTI less likely especially with no GU complaints from patient.  CXR showed bronchitic changes without infiltrate.  Patient was given pain medication and IV hydration.   CT scan showed Ill-defined, edematous pancreatic head and proximal body with mild peripancreatic fat stranding, consistent with acute pancreatitis.   It is unclear the etiology of this patient's pancreatitis as she denies any alcohol use and bladder was unremarkable unremarkable on CT scan.    Decision making with patient, discussed patient risks of outpatient management versus inpatient management.  Patient agreed to inpatient management.  Consult was placed to hospitalist who agreed to admit the patient.     Final Clinical Impression(s) / ED Diagnoses Final diagnoses:  Idiopathic acute pancreatitis without infection or necrosis    Rx / DC Orders ED Discharge Orders    None       Dyann Ruddle 04/13/20 1543    Hayden Rasmussen, MD 04/13/20 Pauline Aus

## 2020-04-13 NOTE — ED Notes (Signed)
Pt resting in bed. NADN 

## 2020-04-13 NOTE — ED Triage Notes (Signed)
Pt reports LUQ pain since last week. Reports pain has gotten significantly worse over the past day. Has taken otc antacids without relief. Some nausea last week that has become more mild this week.

## 2020-04-13 NOTE — ED Notes (Signed)
Pt transported to CT ?

## 2020-04-13 NOTE — Progress Notes (Signed)
Patient arrived to 6N24 from ED. Report received from [place], [place]. Patient alert and oriented x4.. Patient call bell within reach. Will continue to monitor.

## 2020-04-13 NOTE — Hospital Course (Addendum)
Admitted 11/30  Assessment and Plan: Stephanie Ewing is a 64 y.o. female presenting with one week history of abdominal pain.  PMH is significant for hypertension, paroxysmal A. fib, primary hyperaldosteronism, hypothyroidism, asthma, depression, migraine, insomnia, tobacco use.   Abdominal pain likely secondary to acute pancreatitis Patient presented to ED with 1 week history of worsening epigastric and LUQ abdominal pain with decreased appetite, nausea, and vomiting x1.  Afebrile and hemodynamically stable.  Lipase 46 (WNL), creatinine 1.21.  UA+micro demonstrated small Hgb, 5 ketones, large leukocytes, and rare bacteria.  Chest x-ray demonstrates some bronchitic changes but otherwise normal.  CT abdomen pelvis with contrast shows an ill-defined edematous pancreatic head and proximal body along with mild peripancreatic fat stranding all consistent with acute pancreatitis. RUQ US shows no abnormalities, no cholelithiasis.  Unknown etiology of pancreatitis.  Lipid profile completely within normal limits.  No biliary causes seen on RUQ.  First episode of pancreatitis.  Patient denies heavy drinking or history of heavy drinking.  No hepatic dysfunction evidenced on labs.  Family history of pancreatic cancer in great grandfather, which was his cause of death.  Patient steadily improved with initial bowel rest, IV maintenance fluids with LR, and IV fentanyl PRN for pain.  Diet was advanced as tolerated, patient drinking well overnight 12/1 and tolerated full breakfast 12/2.  Oxycodone p.o. replaced IV fentanyl, however gave patient itching.  On day of discharge, patient stable and comfortable, tolerating p.o. fluid and food intake.  Discharged with plan to gently resume diet, home tramadol and/or Tylenol for pain.  Placed outpatient ambulatory referral to GI for work-up of acute pancreatitis of unknown etiology given context of family history of pancreatic cancer.

## 2020-04-13 NOTE — H&P (Addendum)
[facility] Admission History and Physical Service Pager: [phone]  Patient name: Stephanie Ewing Medical record number: [id number] Date of birth: 1956/01/21 Age: 64 y.o. Gender: female  Primary Care Provider: Monico Blitz, MD Consultants: None Code Status: Limited: yes shocks, yes medications, no compressions, yes intubation (given good prognosis and limited expected tube time, <2wks) Preferred Emergency Contact: Husband Clair Gulling, phone number in chart  Chief Complaint: Abdominal pain  Assessment and Plan: Stephanie Ewing is a 64 y.o. female presenting with one week history of abdominal pain.  PMH is significant for hypertension, paroxysmal A. fib, primary hyperaldosteronism, hypothyroidism, asthma, depression, migraine, insomnia, tobacco use.   Abdominal pain likely secondary to acute pancreatitis Patient presented to ED with 1 week history of worsening epigastric and LUQ abdominal pain with decreased appetite, nausea, and vomiting x1.  Afebrile and hemodynamically stable.  Lipase 46 (WNL), creatinine 1.21.  UA+micro demonstrates small Hgb, 5 ketones, large leukocytes, and rare bacteria.  Chest x-ray demonstrates some bronchitic changes but otherwise normal.  CT abdomen pelvis with contrast shows an ill-defined edematous pancreatic head and proximal body along with mild peripancreatic fat stranding all consistent with acute pancreatitis.  Patient has family history of pancreatic cancer in great grandfather, was his cause of death.  Patient has no personal history of previous pancreatitis, gallstones, or appendicitis. No history of abdominal surgery aside from hysterectomy.  Patient is a current smoker with a 20-pack-year history (approximately half pack per day x40 years), but does not drink often and has no history of heavy drinking.  No illicit drugs or prescription abuse.  Clinical and radiographic presentation consistent with acute pancreatitis. Will obtain right  upper quadrant ultrasound to further evaluate etiology of pancreatitis.  -Admit to [facility] with Dr. Thompson Grayer attending -N.p.o. but sips with meds -Advance diet as tolerated -Maintenance IV fluids with LR at 100 mL/hr while NPO -IV fentanyl 25 mcg every 2 as needed  -RUQ ultrasound  Paroxysmal A. Fib Last episode of A.fib in 2018. Currently on aspirin 81 mg. Last seen by cardiology 12/08/2019, sees cardiology once per year. Reports episodes of A. fib in the setting of hypokalemia. Previously on Eliquis but no longer. Cardiology note on 7/26 discussed discontinuation of Eliquis, stating "with recent afib guidelines changing women with CHADS2Vasc score of 2 to a IIB recommendation, and no clear recurrence, reasonable to d/c eliquis at this time".  -Continuous telemetry x24 hours -Monitor potassium with daily BMP -Continue daily aspirin  Resistant hypertension  primary hyperaldosteronism Followed at [facility].  At home takes Aldactone 100 mg daily.  Reports previously resistant hypertension was treated with up to 4 medications, however has been well controlled s/p hyperaldosteronism diagnosis with spironolactone as the only agent. -Continue spironolactone 100 mg daily  Psoriatic arthritis New diagnosis, 6 weeks ago.  Takes methotrexate once weekly.  Has already taken methotrexate this week, will likely not need while inpatient. -Pain relief as needed  Hypothyroid (Hashimoto's) At home takes levothyroxine 135 mcg daily. -Continue levothyroxine as above  Asthma Patient reports this diagnosis is from "a long time ago".  Says that she does not really experience any asthma symptoms.  She has an albuterol inhaler at home for PRN use for wheezing, but "cannot even remember the last time I used it". -Monitor respiratory status -Albuterol as needed  Prediabetes Last A1c 5.7%.  No medications at this time.  Depression  insomnia: chronic At home takes citalopram 20 mg daily, reports "has been on Celexa  forever".  Also reports  chronic insomnia.  Takes trazodone sometimes at bedtime.  Tobacco use Per cardiology note 12/08/2019, has been smoking about a half pack a day for 48 years. Total pack-years = 24. Patient estimates pack-years to be 20 at the most. Normal is 1/2 pack a day.  -Declines nicotine patch, however added to list as PRN if she requests  FEN/GI: NPO at this time Prophylaxis: Lovenox  Disposition: Med Surg  History of Present Illness:  Stephanie Ewing is a 64 y.o. female presenting with 1 week history of worsening epigastric and LUQ abdominal pain with decreased appetite, nausea, and vomiting x1.  Still able to tolerate some liquids and will food by mouth.  Trying to manage pain with Tylenol, however has become worse. No symptoms of cholelithiasis or other biliary tree disorders.  Patient has family history of pancreatic cancer in great grandfather, was his cause of death.  Patient has no personal history of previous pancreatitis, gallstones, or appendicitis. No history of abdominal surgery aside from hysterectomy.  Patient is a current smoker with a 20-pack-year history (approximately half pack per day x40 years), but does not drink often and has no history of heavy drinking.  No illicit drugs or prescription abuse.   Review Of Systems: Per HPI with the following additions:   Review of Systems  Constitutional: Positive for chills, diaphoresis and fatigue. Negative for fever.  Respiratory: Positive for cough (normal intermittent) and shortness of breath (limited, but due to pain). Negative for choking, chest tightness, wheezing and stridor.   Cardiovascular: Positive for palpitations ("a couple"). Negative for chest pain.  Gastrointestinal: Positive for abdominal distention, abdominal pain, blood in stool (normal for hemorroids), nausea and vomiting (x1, one week ago). Negative for constipation and diarrhea.  Genitourinary: Positive for flank pain. Negative for dysuria and  hematuria.  Musculoskeletal: Positive for joint swelling (dx psoriatic arthritis) and myalgias.  Neurological: Positive for dizziness.     Patient Active Problem List   Diagnosis Date Noted   Pancreatitis 04/13/2020   Hypertension     Past Medical History: Past Medical History:  Diagnosis Date   Afib ([place])    Asthma    Depression    Hyperaldosteronism (HCC)    Hypertension    Hypokalemia    Insomnia    Migraine    Nicotine dependence, cigarettes, uncomplicated    Thyroid disease    hypothyroid    Past Surgical History: Past Surgical History:  Procedure Laterality Date   TOTAL ABDOMINAL HYSTERECTOMY  2015  Bilateral shoulder encapsulations   Social History: Social History   Tobacco Use   Smoking status: Current Every Day Smoker    Packs/day: 0.50    Types: Cigarettes    Start date: 03/12/1971   Smokeless tobacco: Never Used   Tobacco comment: 1 pack daily  Substance Use Topics   Alcohol use: Not Currently   Drug use: Never   Please also refer to relevant sections of EMR.  Family History: Family History  Problem Relation Age of Onset   Hypertension Mother    Thyroid disease Mother        hypothyroidism   Thyroid cancer Cousin    Asthma Father    Heart disease Father        from scarlet fever; died age 40 MVA   Hypertension Sister    Hypothyroidism Sister    Hypertension Maternal Grandmother    Heart failure Maternal Grandmother    Diabetes Paternal Grandmother    Heart failure Paternal Grandmother  Allergies and Medications: Allergies  Allergen Reactions   Codeine Itching   No current facility-administered medications on file prior to encounter.   Current Outpatient Medications on File Prior to Encounter  Medication Sig Dispense Refill   aspirin EC 81 MG tablet Take 81 mg by mouth daily.     citalopram (CELEXA) 20 MG tablet Take 20 mg by mouth daily.     famotidine (PEPCID) 20 MG tablet Take 20 mg by mouth daily as needed for indigestion.       fexofenadine (ALLEGRA) 180 MG tablet Take 180 mg by mouth daily.     folic acid (FOLVITE) 1 MG tablet Take 1 mg by mouth daily.     gabapentin (NEURONTIN) 300 MG capsule Take 300 mg by mouth 3 (three) times daily.      Levothyroxine Sodium 125 MCG CAPS Take 112 mcg by mouth daily before breakfast.      meloxicam (MOBIC) 15 MG tablet Take 15 mg by mouth daily as needed for pain (arthritis).      methotrexate (RHEUMATREX) 10 MG tablet Take 10 mg by mouth once a week.      montelukast (SINGULAIR) 10 MG tablet Take 10 mg by mouth daily.     ondansetron (ZOFRAN) 4 MG tablet Take 4 mg by mouth every 8 (eight) hours as needed for nausea.      RESTASIS 0.05 % ophthalmic emulsion 1 drop 2 (two) times daily.     spironolactone (ALDACTONE) 100 MG tablet Take 100 mg by mouth daily.     traMADol (ULTRAM) 50 MG tablet Take 25 mg by mouth daily as needed for moderate pain.      traZODone (DESYREL) 150 MG tablet Take 1 tablet by mouth at bedtime.      diltiazem (CARDIZEM) 30 MG tablet Take 1 tablet every 4 hours AS NEEDED for heart rate >100 as long as top blood pressure >100. (Patient not taking: Reported on 04/13/2020) 45 tablet 1    Objective: BP 139/86   Pulse 71   Temp 98.4 F (36.9 C) (Oral)   Resp 14   Ht 5' 6.5" (1.689 m)   Wt 79.8 kg   SpO2 99%   BMI 27.98 kg/m   Exam: General: Awake, alert, oriented, no acute distress Eyes: EOM intact Cardiovascular: Regular rate and rhythm, no murmurs appreciated, S1 and S2 auscultated Respiratory: Clear to auscultation bilaterally Gastrointestinal: Bowel sounds present, epigastric and LUQ TTP, no rebound tenderness, no guarding MSK: Moving all extremities equally, able to sit up in bed without assistance Neuro: Cranial nerves II through X grossly intact, moving all extremities spontaneously Psych: Normal insight and judgment  Labs and Imaging: CBC BMET  Recent Labs  Lab 04/13/20 1000  WBC 9.4  HGB 15.0  HCT 46.1*  PLT 287   Recent Labs   Lab 04/13/20 1000  NA 139  K 4.4  CL 102  CO2 24  BUN 21  CREATININE 1.21*  GLUCOSE 105*  CALCIUM 10.1     EKG: Normal sinus rhythm, normal axis, no ST elevation, normal R wave progression in V1-6  CHEST-2 VIEW 04/13/2020 COMPARISON:  None. FINDINGS: Normal heart size, mediastinal contours, and pulmonary vascularity. Lungs clear. Central peribronchial thickening present. No infiltrate, pleural effusion or pneumothorax. Osseous structures unremarkable. IMPRESSION: Bronchitic changes without infiltrate.  CT ABDOMEN AND PELVIS WITH CONTRAST 04/13/2020 TECHNIQUE: Multidetector CT imaging of the abdomen and pelvis was performed using the standard protocol following bolus administration of intravenous contrast. CONTRAST:  12mL OMNIPAQUE IOHEXOL 300  MG/ML  SOLN COMPARISON:  None. FINDINGS: Lower chest: No acute abnormality.   Hepatobiliary: No focal liver abnormality is seen. No gallstones, gallbladder wall thickening, or biliary dilatation.   Pancreas: Ill-defined, edematous pancreatic head and proximal body with mild peripancreatic fat stranding. No ductal dilatation.   Spleen: Normal in size without focal abnormality.   Adrenals/Urinary Tract: Adrenal glands are unremarkable. Kidneys are normal, without renal calculi, focal lesion, or hydronephrosis. Bladder is unremarkable.   Stomach/Bowel: Stomach is within normal limits. Appendix appears normal. No evidence of bowel wall thickening, distention, or inflammatory changes.   Vascular/Lymphatic: No significant vascular findings are present. No enlarged abdominal or pelvic lymph nodes.   Reproductive: Status post hysterectomy. No adnexal masses.   Other: No free fluid or pneumoperitoneum.   Musculoskeletal: No acute or significant osseous findings.   IMPRESSION: 1. Ill-defined, edematous pancreatic head and proximal body with mild peripancreatic fat stranding, consistent with acute Pancreatitis.    [name], MD 04/13/2020, 1:57 PM PGY-1, [place] Intern pager: [phone], text pages welcome   FPTS Upper-Level Resident Addendum I have independently interviewed and examined the patient. I have discussed the above with the original author and agree with their documentation. My edits for correction/addition/clarification are in - green. Please see also any attending notes.  [facility] Service pager: [phone] (text pages welcome through [facility])  Wilber Oliphant, M.D.  PGY-3 04/13/2020 5:48 PM

## 2020-04-14 ENCOUNTER — Other Ambulatory Visit: Payer: Self-pay

## 2020-04-14 DIAGNOSIS — E063 Autoimmune thyroiditis: Secondary | ICD-10-CM | POA: Diagnosis present

## 2020-04-14 DIAGNOSIS — E2609 Other primary hyperaldosteronism: Secondary | ICD-10-CM | POA: Diagnosis present

## 2020-04-14 DIAGNOSIS — Z79899 Other long term (current) drug therapy: Secondary | ICD-10-CM | POA: Diagnosis not present

## 2020-04-14 DIAGNOSIS — L4050 Arthropathic psoriasis, unspecified: Secondary | ICD-10-CM | POA: Diagnosis present

## 2020-04-14 DIAGNOSIS — K8500 Idiopathic acute pancreatitis without necrosis or infection: Secondary | ICD-10-CM | POA: Diagnosis present

## 2020-04-14 DIAGNOSIS — J45909 Unspecified asthma, uncomplicated: Secondary | ICD-10-CM | POA: Diagnosis present

## 2020-04-14 DIAGNOSIS — Z8249 Family history of ischemic heart disease and other diseases of the circulatory system: Secondary | ICD-10-CM | POA: Diagnosis not present

## 2020-04-14 DIAGNOSIS — L299 Pruritus, unspecified: Secondary | ICD-10-CM | POA: Diagnosis not present

## 2020-04-14 DIAGNOSIS — E669 Obesity, unspecified: Secondary | ICD-10-CM | POA: Diagnosis present

## 2020-04-14 DIAGNOSIS — Z6827 Body mass index (BMI) 27.0-27.9, adult: Secondary | ICD-10-CM | POA: Diagnosis not present

## 2020-04-14 DIAGNOSIS — F17210 Nicotine dependence, cigarettes, uncomplicated: Secondary | ICD-10-CM | POA: Diagnosis present

## 2020-04-14 DIAGNOSIS — T402X5A Adverse effect of other opioids, initial encounter: Secondary | ICD-10-CM | POA: Diagnosis not present

## 2020-04-14 DIAGNOSIS — Z800 Family history of malignant neoplasm of digestive organs: Secondary | ICD-10-CM | POA: Diagnosis not present

## 2020-04-14 DIAGNOSIS — Z79890 Hormone replacement therapy (postmenopausal): Secondary | ICD-10-CM | POA: Diagnosis not present

## 2020-04-14 DIAGNOSIS — Z808 Family history of malignant neoplasm of other organs or systems: Secondary | ICD-10-CM | POA: Diagnosis not present

## 2020-04-14 DIAGNOSIS — Z23 Encounter for immunization: Secondary | ICD-10-CM | POA: Diagnosis present

## 2020-04-14 DIAGNOSIS — Z20822 Contact with and (suspected) exposure to covid-19: Secondary | ICD-10-CM | POA: Diagnosis present

## 2020-04-14 DIAGNOSIS — Z7982 Long term (current) use of aspirin: Secondary | ICD-10-CM | POA: Diagnosis not present

## 2020-04-14 DIAGNOSIS — N1831 Chronic kidney disease, stage 3a: Secondary | ICD-10-CM | POA: Diagnosis present

## 2020-04-14 DIAGNOSIS — I480 Paroxysmal atrial fibrillation: Secondary | ICD-10-CM | POA: Diagnosis present

## 2020-04-14 DIAGNOSIS — F5104 Psychophysiologic insomnia: Secondary | ICD-10-CM | POA: Diagnosis present

## 2020-04-14 DIAGNOSIS — F32A Depression, unspecified: Secondary | ICD-10-CM | POA: Diagnosis present

## 2020-04-14 DIAGNOSIS — K859 Acute pancreatitis without necrosis or infection, unspecified: Secondary | ICD-10-CM | POA: Diagnosis present

## 2020-04-14 DIAGNOSIS — I129 Hypertensive chronic kidney disease with stage 1 through stage 4 chronic kidney disease, or unspecified chronic kidney disease: Secondary | ICD-10-CM | POA: Diagnosis present

## 2020-04-14 DIAGNOSIS — R7303 Prediabetes: Secondary | ICD-10-CM | POA: Diagnosis present

## 2020-04-14 LAB — COMPREHENSIVE METABOLIC PANEL
ALT: 23 U/L (ref 0–44)
AST: 14 U/L — ABNORMAL LOW (ref 15–41)
Albumin: 3.3 g/dL — ABNORMAL LOW (ref 3.5–5.0)
Alkaline Phosphatase: 54 U/L (ref 38–126)
Anion gap: 12 (ref 5–15)
BUN: 16 mg/dL (ref 8–23)
CO2: 25 mmol/L (ref 22–32)
Calcium: 9.6 mg/dL (ref 8.9–10.3)
Chloride: 102 mmol/L (ref 98–111)
Creatinine, Ser: 1.12 mg/dL — ABNORMAL HIGH (ref 0.44–1.00)
GFR, Estimated: 55 mL/min — ABNORMAL LOW (ref >60)
Glucose, Bld: 92 mg/dL (ref 70–99)
Potassium: 3.8 mmol/L (ref 3.5–5.1)
Sodium: 139 mmol/L (ref 135–145)
Total Bilirubin: 0.7 mg/dL (ref 0.3–1.2)
Total Protein: 6.3 g/dL — ABNORMAL LOW (ref 6.5–8.1)

## 2020-04-14 LAB — CBC
HCT: 41.2 % (ref 36.0–46.0)
Hemoglobin: 13.3 g/dL (ref 12.0–15.0)
MCH: 30.4 pg (ref 26.0–34.0)
MCHC: 32.3 g/dL (ref 30.0–36.0)
MCV: 94.3 fL (ref 80.0–100.0)
Platelets: 237 10*3/uL (ref 150–400)
RBC: 4.37 MIL/uL (ref 3.87–5.11)
RDW: 13.3 % (ref 11.5–15.5)
WBC: 8 10*3/uL (ref 4.0–10.5)
nRBC: 0 % (ref 0.0–0.2)

## 2020-04-14 LAB — HIV ANTIBODY (ROUTINE TESTING W REFLEX): HIV Screen 4th Generation wRfx: NONREACTIVE

## 2020-04-14 MED ORDER — DIPHENHYDRAMINE HCL 25 MG PO CAPS
25.0000 mg | ORAL_CAPSULE | Freq: Three times a day (TID) | ORAL | Status: DC | PRN
  Administered 2020-04-14: 25 mg via ORAL
  Filled 2020-04-14: qty 1

## 2020-04-14 MED ORDER — PNEUMOCOCCAL VAC POLYVALENT 25 MCG/0.5ML IJ INJ
0.5000 mL | INJECTION | INTRAMUSCULAR | Status: AC
  Administered 2020-04-15: 0.5 mL via INTRAMUSCULAR
  Filled 2020-04-14: qty 0.5

## 2020-04-14 MED ORDER — FENTANYL CITRATE (PF) 100 MCG/2ML IJ SOLN
25.0000 ug | Freq: Once | INTRAMUSCULAR | Status: AC
  Administered 2020-04-14: 25 ug via INTRAVENOUS
  Filled 2020-04-14: qty 2

## 2020-04-14 MED ORDER — ASPIRIN 81 MG PO CHEW
81.0000 mg | CHEWABLE_TABLET | Freq: Every day | ORAL | Status: DC
  Administered 2020-04-14 – 2020-04-15 (×2): 81 mg via ORAL
  Filled 2020-04-14 (×2): qty 1

## 2020-04-14 MED ORDER — OXYCODONE HCL 5 MG PO TABS
5.0000 mg | ORAL_TABLET | ORAL | Status: DC | PRN
  Administered 2020-04-14 (×2): 5 mg via ORAL
  Filled 2020-04-14 (×2): qty 1

## 2020-04-14 NOTE — Progress Notes (Addendum)
Family Medicine Teaching Service Daily Progress Note Intern Pager: [phone]  Patient name: Stephanie Ewing Medical record number: [id number] Date of birth: 1956-02-02 Age: 64 y.o. Gender: female  Primary Care Provider: Monico Blitz, MD Consultants: None Code Status: Partial  Pt Overview and Major Events to Date:  Admitted 11/30  Assessment and Plan: Stephanie Ewing is a 64 y.o. female presenting with one week history of abdominal pain.  PMH is significant for hypertension, paroxysmal A. fib, primary hyperaldosteronism, hypothyroidism, asthma, depression, migraine, insomnia, tobacco use.   Abdominal pain likely secondary to acute pancreatitis Reports doing well overnight, so that she slept quite comfortably.  Received IV fentanyl 25 mcg x3 overnight, last 2330.  First p.o. intake with some hot tea this morning without sugar or  cream, patient did experience some left-sided abdominal pain afterwards. -N.p.o. but sips with meds -Advance diet as tolerated -Maintenance IV fluids with LR at 100 mL/hr while NPO -IV fentanyl 25 mcg every 2 as needed   Resistant hypertension  primary hyperaldosteronism: chronic, stable Followed at [facility].  At home takes Aldactone 100 mg daily.  Reports previously resistant hypertension was treated with up to 4 medications, however has been well controlled s/p hyperaldosteronism diagnosis with spironolactone as the only agent. -Held spironolactone yesterday due to hypo-volemic appearance -Will restart spironolactone upon regular diet and fluid intake  CKD Stage 3a: Chronic, stable Creatinine upon admission 1.21, trending down today with measurement 1.12.  Suspected baseline 1.2-1.3.  -Continue to monitor with daily BMPs  Paroxysmal A. Fib No palpitations overnight. Last episode of A.fib in 2018. Currently on aspirin 81 mg. Last seen by cardiology 12/08/2019, sees cardiology once per year. Reports episodes of A. fib in the setting of hypokalemia.  Previously on Eliquis but no longer. Cardiology note on 7/26 discussed discontinuation of Eliquis, stating "with recent afib guidelines changing women with CHADS2Vasc score of 2 to a IIB recommendation,and no clear recurrence, reasonable to d/c eliquis at this time".  -Continuous telemetry x24 hours -Monitor potassium with daily BMP -Continue daily aspirin  Psoriatic arthritis New diagnosis, 6 weeks ago.  Takes methotrexate once weekly.  Has already taken methotrexate this week, will likely not need while inpatient. -Pain relief as needed  Hypothyroid (Hashimoto's) At home takes levothyroxine 135 mcg daily. -Continue levothyroxine as above  Asthma Patient reports this diagnosis is from "a long time ago".  Says that she does not really experience any asthma symptoms.  She has an albuterol inhaler at home for PRN use for wheezing, but "cannot even remember the last time I used it". -Monitor respiratory status -Albuterol as needed  Prediabetes Last A1c 5.7%.  No medications at this time.  Depression  insomnia: chronic At home takes citalopram 20 mg daily, reports "has been on Celexa forever".  Also reports chronic insomnia.  Takes trazodone sometimes at bedtime.  Tobacco use Per cardiology note 12/08/2019, has been smoking about a half pack a day for 48 years. Total pack-years = 24. Patient estimates pack-years to be 20 at the most. Normal is 1/2 pack a day.  -Declines nicotine patch, however added to list as PRN if she requests  FEN/GI: NPO at this time Prophylaxis: Lovenox   Status is: Observation  The patient remains OBS appropriate and will d/c before 2 midnights.  Dispo: The patient is from: Home              Anticipated d/c is to: Home  Anticipated d/c date is: 2 days              Patient currently is not medically stable to d/c.    Subjective:  Patient found laying in bed this morning, awake, alert, no acute distress.  Appear to be in good spirits.   She reports sleeping well overnight, last pain medication around 11 PM.  Said that she got some restful sleep.  First p.o. intake this morning was hot tea without cream or sugar, but had some left-sided abdominal pain afterwards.  Discussed advancing diet as tolerated with key being "as tolerated".  No other complaints.  Objective: Temp:  [97.2 F (36.2 C)-98.5 F (36.9 C)] 97.5 F (36.4 C) (12/01 0209) Pulse Rate:  [63-89] 69 (12/01 0209) Resp:  [13-28] 19 (12/01 0209) BP: (116-162)/(70-96) 121/80 (12/01 0209) SpO2:  [93 %-100 %] 100 % (12/01 0209) Weight:  [79.8 kg] 79.8 kg (11/30 0951)  Physical Exam: General: Awake, alert, no acute distress, oriented Cardiovascular: Regular rate and rhythm, S1 and S2 auscultated, no murmurs appreciated Respiratory: Clear to auscultation bilaterally Abdomen: Epigastric and LUQ TTP, bowel sounds present Extremities: Moving all spontaneously  Laboratory: Recent Labs  Lab 04/13/20 1000 04/14/20 0040  WBC 9.4 8.0  HGB 15.0 13.3  HCT 46.1* 41.2  PLT 287 237   Recent Labs  Lab 04/13/20 1000 04/14/20 0040  NA 139 139  K 4.4 3.8  CL 102 102  CO2 24 25  BUN 21 16  CREATININE 1.21* 1.12*  CALCIUM 10.1 9.6  PROT 7.5 6.3*  BILITOT 0.6 0.7  ALKPHOS 68 54  ALT 29 23  AST 19 14*  GLUCOSE 105* 92    Imaging/Diagnostic Tests:  CT ABDOMEN AND PELVIS WITH CONTRAST 04/13/2020 TECHNIQUE: Multidetector CT imaging of the abdomen and pelvis was performed using the standard protocol following bolus administration of intravenous contrast.  CONTRAST:  112mL OMNIPAQUE IOHEXOL 300 MG/ML  SOLN  COMPARISON:  None.  FINDINGS: Lower chest: No acute abnormality.  Hepatobiliary: No focal liver abnormality is seen. No gallstones, gallbladder wall thickening, or biliary dilatation.  Pancreas: Ill-defined, edematous pancreatic head and proximal body with mild peripancreatic fat stranding. No ductal dilatation.  Spleen: Normal in size without  focal abnormality.  Adrenals/Urinary Tract: Adrenal glands are unremarkable. Kidneys are normal, without renal calculi, focal lesion, or hydronephrosis. Bladder is unremarkable.  Stomach/Bowel: Stomach is within normal limits. Appendix appears normal. No evidence of bowel wall thickening, distention, or inflammatory changes.  Vascular/Lymphatic: No significant vascular findings are present. No enlarged abdominal or pelvic lymph nodes.  Reproductive: Status post hysterectomy. No adnexal masses.  Other: No free fluid or pneumoperitoneum.  Musculoskeletal: No acute or significant osseous findings.  IMPRESSION: 1. Ill-defined, edematous pancreatic head and proximal body with mild peripancreatic fat stranding, consistent with acute Pancreatitis.   ULTRASOUND ABDOMEN LIMITED RIGHT UPPER QUADRANT 04/13/2020 FINDINGS: Gallbladder: No gallstones or wall thickening visualized. No sonographic Murphy sign noted by sonographer.  Common bile duct: Diameter: 7 mm  Liver: No focal lesion identified. Within normal limits in parenchymal echogenicity. Portal vein is patent on color Doppler imaging with normal direction of blood flow towards the liver.  IMPRESSION: 1. Unremarkable right upper quadrant ultrasound. Please refer to preceding CT report describing acute uncomplicated pancreatitis.    [name], MD 04/14/2020, 9:45 AM PGY-1, [place] Intern pager: [phone], text pages welcome

## 2020-04-14 NOTE — Progress Notes (Signed)
FPTS Interim Progress Note  S: Saw patient this afternoon to see how her abdominal pain was doing.. She endorses some pain in epigastrium but this is a "step up" compared to yesterday. She has been able to drink broth with no abdominal pain after. Denies nausea or vomiting.    O: BP (!) 131/59 (BP Location: Left Arm)    Pulse 63    Temp 97.8 F (36.6 C) (Oral)    Resp 16    Ht 5' 6.5" (1.689 m)    Wt 79.8 kg    SpO2 98%    BMI 27.98 kg/m    General: Alert, no acute distress, pleasant  Cardio: Normal S1 and S2, RRR, no r/m/g Pulm: CTAB, normal work of breathing Abdomen: abdomen non-distended, generalized tenderness, tender in epigastrium, no guarding   Neuro: Cranial nerves grossly intact  A/P: Continue 25 mcg fentanyl Q2H PRN and oxycodone 5mg  Q4H PRN. Plan per day note from today.  Lattie Haw, MD 04/14/2020, 4:56 PM PGY-2, [place] Medicine Service pager [phone]

## 2020-04-14 NOTE — Progress Notes (Signed)
Patient complaining of itching in her upper extremities after starting PO oxycodone. Last dose charted 1646. PRN order for 25 mg PO benadryl placed.   Wilber Oliphant, M.D.  9:23 PM 04/14/2020

## 2020-04-15 DIAGNOSIS — K8500 Idiopathic acute pancreatitis without necrosis or infection: Secondary | ICD-10-CM | POA: Diagnosis not present

## 2020-04-15 DIAGNOSIS — Z515 Encounter for palliative care: Secondary | ICD-10-CM

## 2020-04-15 DIAGNOSIS — Z800 Family history of malignant neoplasm of digestive organs: Secondary | ICD-10-CM

## 2020-04-15 LAB — BASIC METABOLIC PANEL
Anion gap: 12 (ref 5–15)
BUN: 12 mg/dL (ref 8–23)
CO2: 27 mmol/L (ref 22–32)
Calcium: 9.6 mg/dL (ref 8.9–10.3)
Chloride: 103 mmol/L (ref 98–111)
Creatinine, Ser: 1.05 mg/dL — ABNORMAL HIGH (ref 0.44–1.00)
GFR, Estimated: 59 mL/min — ABNORMAL LOW (ref >60)
Glucose, Bld: 100 mg/dL — ABNORMAL HIGH (ref 70–99)
Potassium: 4.2 mmol/L (ref 3.5–5.1)
Sodium: 142 mmol/L (ref 135–145)

## 2020-04-15 LAB — CBC
HCT: 35.7 % — ABNORMAL LOW (ref 36.0–46.0)
Hemoglobin: 12.4 g/dL (ref 12.0–15.0)
MCH: 31.9 pg (ref 26.0–34.0)
MCHC: 34.7 g/dL (ref 30.0–36.0)
MCV: 91.8 fL (ref 80.0–100.0)
Platelets: 220 10*3/uL (ref 150–400)
RBC: 3.89 MIL/uL (ref 3.87–5.11)
RDW: 13.4 % (ref 11.5–15.5)
WBC: 5.8 10*3/uL (ref 4.0–10.5)
nRBC: 0 % (ref 0.0–0.2)

## 2020-04-15 MED ORDER — ACETAMINOPHEN 325 MG PO TABS
650.0000 mg | ORAL_TABLET | Freq: Four times a day (QID) | ORAL | Status: DC
  Administered 2020-04-15: 650 mg via ORAL
  Filled 2020-04-15: qty 2

## 2020-04-15 MED ORDER — SPIRONOLACTONE 100 MG PO TABS: 100.0000 mg | ORAL_TABLET | Freq: Every day | ORAL | Status: DC

## 2020-04-15 MED ORDER — OXYCODONE HCL 5 MG PO TABS: 5.0000 mg | ORAL_TABLET | ORAL | 0 refills | Status: AC | PRN

## 2020-04-15 MED ORDER — ACETAMINOPHEN 325 MG PO TABS: 650.0000 mg | ORAL_TABLET | Freq: Four times a day (QID) | ORAL | Status: AC

## 2020-04-15 MED ORDER — SPIRONOLACTONE 100 MG PO TABS: 100.0000 mg | ORAL_TABLET | Freq: Every day | ORAL | 0 refills | Status: DC

## 2020-04-15 NOTE — Discharge Instructions (Signed)
Dear Stephanie Ewing,  Thank you for letting us participate in your care. You were hospitalized for abdominal pain, nausea, and vomiting and diagnosed with acute pancreatitis. You were treated with IV fluids and IV pain medications.   POST-HOSPITAL & CARE INSTRUCTIONS 1. Schedule a hospital follow up appointment with your primary care doctor (PCP).  2. We have placed a referral to a gastroenterologist (GI doctor) to work up the cause of this pancreatitis. Someone from a GI office should be reaching out to schedule this with you. Please let your PCP know if you don't hear anything.  3. We have instructed you to temporarily stop taking trazodone and meloxicam while recovering from acute pancreatitis. You may restart after discussion with your regular doctor (either PCP or rheumatologist).  4. Go to your follow up appointments (listed below)   DOCTOR'S APPOINTMENT   Future Appointments  Date Time Provider [place]  06/14/2020  1:40 PM Branch, Alphonse Guild, MD [id number]    Follow-up Information    Monico Blitz, MD. Schedule an appointment as soon as possible for a visit in 1 week(s).   Specialty: Internal Medicine Why: Call your primary care office and schedule a hospital follow up appointment for about one week after hospital discharge.  Contact information: [place] [address] [phone]        Arnoldo Lenis, MD Follow up.   Specialty: Cardiology Why: Follow up with your cardiologist as normal. No hospital follow up appointment needed with cardiology.  Contact information: [place] [address] [phone]               Take care and be well!  [facility]  [place], [place] [address] [phone]      Acute Pancreatitis  The pancreas is a gland that is located behind the stomach on the left side of the abdomen.  It produces enzymes that help to digest food. The pancreas also releases the hormones glucagon and insulin, which help to regulate blood sugar. Acute pancreatitis happens when inflammation of the pancreas suddenly occurs and the pancreas becomes irritated and swollen. Most acute attacks last a few days and cause serious problems. Some people become dehydrated and develop low blood pressure. In severe cases, bleeding in the abdomen can lead to shock and can be life-threatening. The lungs, heart, and kidneys may fail. What are the causes? This condition may be caused by:  Alcohol abuse.  Drug abuse.  Gallstones or other conditions that can block the tube that drains the pancreas (pancreatic duct).  A tumor in the pancreas. Other causes include:  Certain medicines.  Exposure to certain chemicals.  Diabetes.  An infection in the pancreas.  Damage caused by an accident (trauma).  The poison (venom) from a scorpion bite.  Abdominal surgery.  Autoimmune pancreatitis. This is when the body's disease-fighting (immune) system attacks the pancreas.  Genes that are passed from parent to child (inherited). In some cases, the cause of this condition is not known. What are the signs or symptoms? Symptoms of this condition include:  Pain in the upper abdomen that may radiate to the back. Pain may be severe.  Tenderness and swelling of the abdomen.  Nausea and vomiting.  Fever. How is this diagnosed? This condition may be diagnosed based on:  A physical exam.  Blood tests.  Imaging tests, such as X-rays, CT or  MRI scans, or an ultrasound of the abdomen. How is this treated? Treatment for this condition usually requires a stay in the hospital. Treatment for this condition may include:  Pain medicine.  Fluid replacement through an IV.  Placing a tube in the stomach to remove stomach contents and to control vomiting (NG tube, or nasogastric tube).  Not eating for 3-4 days. This  gives the pancreas a rest, because enzymes are not being produced that can cause further damage.  Antibiotic medicines, if your condition is caused by an infection.  Treating any underlying conditions that may be the cause.  Steroid medicines, if your condition is caused by your immune system attacking your body's own tissues (autoimmune disease).  Surgery on the pancreas or gallbladder. Follow these instructions at home: Eating and drinking   Follow instructions from your health care provider about diet. This may involve avoiding alcohol and decreasing the amount of fat in your diet.  Eat smaller, more frequent meals. This reduces the amount of digestive fluids that the pancreas produces.  Drink enough fluid to keep your urine pale yellow.  Do not drink alcohol if it caused your condition. General instructions  Take over-the-counter and prescription medicines only as told by your health care provider.  Do not drive or use heavy machinery while taking prescription pain medicine.  Ask your health care provider if the medicine prescribed to you can cause constipation. You may need to take steps to prevent or treat constipation, such as: ? Take an over-the-counter or prescription medicine for constipation. ? Eat foods that are high in fiber such as whole grains and beans. ? Limit foods that are high in fat and processed sugars, such as fried or sweet foods.  Do not use any products that contain nicotine or tobacco, such as cigarettes, e-cigarettes, and chewing tobacco. If you need help quitting, ask your health care provider.  Get plenty of rest.  If directed, check your blood sugar at home as told by your health care provider.  Keep all follow-up visits as told by your health care provider. This is important. Contact a health care provider if you:  Do not recover as quickly as expected.  Develop new or worsening symptoms.  Have persistent pain, weakness, or  nausea.  Recover and then have another episode of pain.  Have a fever. Get help right away if:  You cannot eat or keep fluids down.  Your pain becomes severe.  Your skin or the white part of your eyes turns yellow (jaundice).  You have sudden swelling in your abdomen.  You vomit.  You feel dizzy or you faint.  Your blood sugar is high (over 300 mg/dL). Summary  Acute pancreatitis happens when inflammation of the pancreas suddenly occurs and the pancreas becomes irritated and swollen.  This condition is typically caused by alcohol abuse, drug abuse, or gallstones.  Treatment for this condition usually requires a stay in the hospital. This information is not intended to replace advice given to you by your health care provider. Make sure you discuss any questions you have with your health care provider. Document Revised: 02/18/2018 Document Reviewed: 11/05/2017 Elsevier Patient Education  [place].

## 2020-04-15 NOTE — Plan of Care (Signed)

## 2020-04-15 NOTE — Discharge Summary (Signed)
[facility] Discharge Summary  Patient name: Stephanie Ewing Medical record number: [id number] Date of birth: [birth year] Age: 64 y.o. Gender: female Date of Admission: 04/13/2020  Date of Discharge: 04/15/2020 Admitting Physician: Lenoria Chime, MD  Primary Care Provider: Monico Blitz, MD Consultants: None  Indication for Hospitalization: Acute pancreatitis  Discharge Diagnoses/Problem List:  Acute pancreatitis of unknown etiology Primary hyperaldosteronism Hypertension treated with spironolactone Remote history of paroxysmal A. Fib Psoriatic arthritis Hashimoto's hypothyroidism Prediabetes Depression Insomnia Tobacco use Remote history of asthma CKD stage IIIa -Per chart review   Disposition: Home  Discharge Condition: Stable  Discharge Exam:  Temp:  [97.7 F (36.5 C)-98.3 F (36.8 C)] 97.7 F (36.5 C) (12/02 0515) Pulse Rate:  [63-70] 66 (12/02 0515) Resp:  [16-18] 18 (12/02 0515) BP: (116-131)/(59-64) 116/61 (12/02 0515) SpO2:  [95 %-98 %] 95 % (12/02 0515)  Physical Exam: General: Awake, alert, no acute distress, oriented Abdomen: Mild TTP in epigastrium and LUQ, no rebound tenderness Extremities: No BLE edema  Brief Hospital Course:  Admitted 11/30  Assessment and Plan: Stephanie Ewing is a 64 y.o. female presenting with one week history of abdominal pain.  PMH is significant for hypertension, paroxysmal A. fib, primary hyperaldosteronism, hypothyroidism, asthma, depression, migraine, insomnia, tobacco use.   Abdominal pain likely secondary to acute pancreatitis Patient presented to ED with 1 week history of worsening epigastric and LUQ abdominal pain with decreased appetite, nausea, and vomiting x1.  Afebrile and hemodynamically stable.  Lipase 46 (WNL), creatinine 1.21.  UA+micro demonstrated small Hgb, 5 ketones, large leukocytes, and rare bacteria.  Chest x-ray demonstrates some bronchitic changes but otherwise normal.   CT abdomen pelvis with contrast shows an ill-defined edematous pancreatic head and proximal body along with mild peripancreatic fat stranding all consistent with acute pancreatitis. RUQ US shows no abnormalities, no cholelithiasis.  Unknown etiology of pancreatitis.  Lipid profile completely within normal limits.  No biliary causes seen on RUQ.  First episode of pancreatitis.  Patient denies heavy drinking or history of heavy drinking.  No hepatic dysfunction evidenced on labs.  Family history of pancreatic cancer in great grandfather, which was his cause of death.  Patient steadily improved with initial bowel rest, IV maintenance fluids with LR, and IV fentanyl PRN for pain.  Diet was advanced as tolerated, patient drinking well overnight 12/1 and tolerated full breakfast 12/2.  Oxycodone p.o. replaced IV fentanyl, however gave patient itching.  On day of discharge, patient stable and comfortable, tolerating p.o. fluid and food intake.  Discharged with plan to gently resume diet, home tramadol and/or Tylenol for pain.  Placed outpatient ambulatory referral to GI for work-up of acute pancreatitis of unknown etiology given context of family history of pancreatic cancer.   Issues for Follow Up:  1. Monitor abdominal pain secondary to acute pancreatitis.  We discharged patient on Tylenol as main analgesic medication with oxycodone IR as backup.  Patient had pruritus secondary to oxycodone in hospital but requested small amount for home.  2. Unknown etiology for pancreatitis: We found a few case reports of methotrexate possibly contributing to or causing pancreatitis. Also found reports of opioids (tramadol) causing acute pancreatitis. Consider risk vs benefit of these therapies for patient overall.  3. Istructed patient to temporarily stop taking meloxicam and tramadol while in acute pancreatitis. Asked her to talk with regular doctor (either PCP or rheum) prior to restarting.  4. Placed ambulatory referral to  GI, as acute pancreatitis has currently unknown etiology and  has family history of pancreatic cancer in great-grandfather.  Significant Procedures: None  Significant Labs and Imaging:  Recent Labs  Lab 04/13/20 1000 04/14/20 0040 04/15/20 0410  WBC 9.4 8.0 5.8  HGB 15.0 13.3 12.4  HCT 46.1* 41.2 35.7*  PLT 287 237 220   Recent Labs  Lab 04/13/20 1000 04/13/20 1000 04/14/20 0040 04/15/20 0410  NA 139  --  139 142  K 4.4   < > 3.8 4.2  CL 102  --  102 103  CO2 24  --  25 27  GLUCOSE 105*  --  92 100*  BUN 21  --  16 12  CREATININE 1.21*  --  1.12* 1.05*  CALCIUM 10.1  --  9.6 9.6  ALKPHOS 68  --  54  --   AST 19  --  14*  --   ALT 29  --  23  --   ALBUMIN 4.0  --  3.3*  --    < > = values in this interval not displayed.    Cholesterol 0 - 200 mg/dL 156   Triglycerides <150 mg/dL 99   HDL >40 mg/dL 57   Total CHOL/HDL Ratio RATIO 2.7   VLDL 0 - 40 mg/dL 20   LDL Cholesterol 0 - 99 mg/dL 79    EKG: Normal sinus rhythm  CHEST XR-2 VIEW 04/13/2020 COMPARISON:  None. FINDINGS: Normal heart size, mediastinal contours, and pulmonary vascularity. Lungs clear. Central peribronchial thickening present. No infiltrate, pleural effusion or pneumothorax. Osseous structures unremarkable. IMPRESSION: Bronchitic changes without infiltrate.  CT ABDOMEN AND PELVIS WITH CONTRAST 04/13/2020 TECHNIQUE: Multidetector CT imaging of the abdomen and pelvis was performed using the standard protocol following bolus administration of intravenous contrast. CONTRAST:  172mL OMNIPAQUE IOHEXOL 300 MG/ML  SOLN COMPARISON:  None. FINDINGS: Lower chest: No acute abnormality.  Hepatobiliary: No focal liver abnormality is seen. No gallstones, gallbladder wall thickening, or biliary dilatation.  Pancreas: Ill-defined, edematous pancreatic head and proximal body with mild peripancreatic fat stranding. No ductal dilatation.  Spleen: Normal in size without focal  abnormality.  Adrenals/Urinary Tract: Adrenal glands are unremarkable. Kidneys are normal, without renal calculi, focal lesion, or hydronephrosis. Bladder is unremarkable.  Stomach/Bowel: Stomach is within normal limits. Appendix appears normal. No evidence of bowel wall thickening, distention, or inflammatory changes.  Vascular/Lymphatic: No significant vascular findings are present. No enlarged abdominal or pelvic lymph nodes.  Reproductive: Status post hysterectomy. No adnexal masses.  Other: No free fluid or pneumoperitoneum.  Musculoskeletal: No acute or significant osseous findings.  IMPRESSION: 1. Ill-defined, edematous pancreatic head and proximal body with mild peripancreatic fat stranding, consistent with acute Pancreatitis.  ULTRASOUND ABDOMEN LIMITED RIGHT UPPER QUADRANT 04/13/2020 COMPARISON:  04/13/2020 FINDINGS: Gallbladder: No gallstones or wall thickening visualized. No sonographic Murphy sign noted by sonographer. Common bile duct: Diameter: 7 mm Liver: No focal lesion identified. Within normal limits in parenchymal echogenicity. Portal vein is patent on color Doppler imaging with normal direction of blood flow towards the liver. Other: None. IMPRESSION: 1. Unremarkable right upper quadrant ultrasound. Please refer to preceding CT report describing acute uncomplicated pancreatitis.   Results/Tests Pending at Time of Discharge: None  Discharge Medications:  Allergies as of 04/15/2020      Reactions   Codeine Itching      Medication List    STOP taking these medications   meloxicam 15 MG tablet Commonly known as: MOBIC   traMADol 50 MG tablet Commonly known as: ULTRAM     TAKE these medications  acetaminophen 325 MG tablet Commonly known as: TYLENOL Take 2 tablets (650 mg total) by mouth every 6 (six) hours.   aspirin EC 81 MG tablet Take 81 mg by mouth daily.   citalopram 20 MG tablet Commonly known as: CELEXA Take 20 mg by  mouth daily.   diltiazem 30 MG tablet Commonly known as: Cardizem Take 1 tablet every 4 hours AS NEEDED for heart rate >100 as long as top blood pressure >100.   famotidine 20 MG tablet Commonly known as: PEPCID Take 20 mg by mouth daily as needed for indigestion.   fexofenadine 180 MG tablet Commonly known as: ALLEGRA Take 180 mg by mouth daily.   folic acid 1 MG tablet Commonly known as: FOLVITE Take 1 mg by mouth daily.   gabapentin 300 MG capsule Commonly known as: NEURONTIN Take 300 mg by mouth 3 (three) times daily.   Levothyroxine Sodium 125 MCG Caps Take 112 mcg by mouth daily before breakfast.   methotrexate 10 MG tablet Commonly known as: RHEUMATREX Take 10 mg by mouth every Friday.   montelukast 10 MG tablet Commonly known as: SINGULAIR Take 10 mg by mouth daily.   ondansetron 4 MG tablet Commonly known as: ZOFRAN Take 4 mg by mouth every 8 (eight) hours as needed for nausea.   oxyCODONE 5 MG immediate release tablet Commonly known as: Oxy IR/ROXICODONE Take 1 tablet (5 mg total) by mouth every 4 (four) hours as needed for up to 5 days for moderate pain or severe pain.   Restasis 0.05 % ophthalmic emulsion Generic drug: cycloSPORINE 1 drop 2 (two) times daily.   spironolactone 100 MG tablet Commonly known as: ALDACTONE Take 1 tablet (100 mg total) by mouth daily. Start 100mg  daily from 04/16/20 What changed: additional instructions   traZODone 150 MG tablet Commonly known as: DESYREL Take 1 tablet by mouth at bedtime.       Discharge Instructions: Please refer to Patient Instructions section of EMR for full details.  Patient was counseled important signs and symptoms that should prompt return to medical care, changes in medications, dietary instructions, activity restrictions, and follow up appointments.   Follow-Up Appointments:  Follow-up Information    Monico Blitz, MD. Schedule an appointment as soon as possible for a visit in 1 week(s).    Specialty: Internal Medicine Why: Call your primary care office and schedule a hospital follow up appointment for about one week after hospital discharge.  Contact information: [place] [address] [phone]        Arnoldo Lenis, MD Follow up.   Specialty: Cardiology Why: Follow up with your cardiologist as normal. No hospital follow up appointment needed with cardiology.  Contact information: [place] [address] [phone]               Thompson Caul MD 04/16/2020, 9:21 AM PGY-1, [facility] Resident Addendum   I have independently interviewed and examined the patient. I have discussed the above with the original author and agree with their documentation. My edits for correction/addition/clarification have been made.  [place] Constable, D.O. PGY-3, [facility] 04/16/2020 9:22 AM

## 2020-04-15 NOTE — Progress Notes (Signed)
Discharge instructions (including medications) will be  discussed with and a copy will be provided to patient.  

## 2020-04-15 NOTE — Plan of Care (Signed)
Problem: Education: Goal: Knowledge of General Education information will improve Description: Including pain rating scale, medication(s)/side effects and non-pharmacologic comfort measures 04/15/2020 1348 by Bethann Punches, RN Outcome: Adequate for Discharge 04/15/2020 1128 by Bethann Punches, RN Outcome: Progressing   Problem: Health Behavior/Discharge Planning: Goal: Ability to manage health-related needs will improve 04/15/2020 1348 by Bethann Punches, RN Outcome: Adequate for Discharge 04/15/2020 1128 by Bethann Punches, RN Outcome: Progressing   Problem: Clinical Measurements: Goal: Ability to maintain clinical measurements within normal limits will improve 04/15/2020 1348 by Bethann Punches, RN Outcome: Adequate for Discharge 04/15/2020 1128 by Bethann Punches, RN Outcome: Progressing Goal: Will remain free from infection 04/15/2020 1348 by Bethann Punches, RN Outcome: Adequate for Discharge 04/15/2020 1128 by Bethann Punches, RN Outcome: Progressing Goal: Diagnostic test results will improve 04/15/2020 1348 by Bethann Punches, RN Outcome: Adequate for Discharge 04/15/2020 1128 by Bethann Punches, RN Outcome: Progressing Goal: Respiratory complications will improve 04/15/2020 1348 by Bethann Punches, RN Outcome: Adequate for Discharge 04/15/2020 1128 by Bethann Punches, RN Outcome: Progressing Goal: Cardiovascular complication will be avoided 04/15/2020 1348 by Bethann Punches, RN Outcome: Adequate for Discharge 04/15/2020 1128 by Bethann Punches, RN Outcome: Progressing   Problem: Activity: Goal: Risk for activity intolerance will decrease 04/15/2020 1348 by Bethann Punches, RN Outcome: Adequate for Discharge 04/15/2020 1128 by Bethann Punches, RN Outcome: Progressing   Problem: Nutrition: Goal: Adequate nutrition will be maintained 04/15/2020 1348 by Bethann Punches, RN Outcome: Adequate for Discharge 04/15/2020 1128 by Bethann Punches, RN Outcome: Progressing    Problem: Coping: Goal: Level of anxiety will decrease 04/15/2020 1348 by Bethann Punches, RN Outcome: Adequate for Discharge 04/15/2020 1128 by Bethann Punches, RN Outcome: Progressing   Problem: Elimination: Goal: Will not experience complications related to bowel motility 04/15/2020 1348 by Bethann Punches, RN Outcome: Adequate for Discharge 04/15/2020 1128 by Bethann Punches, RN Outcome: Progressing Goal: Will not experience complications related to urinary retention 04/15/2020 1348 by Bethann Punches, RN Outcome: Adequate for Discharge 04/15/2020 1128 by Bethann Punches, RN Outcome: Progressing   Problem: Pain Managment: Goal: General experience of comfort will improve 04/15/2020 1348 by Bethann Punches, RN Outcome: Adequate for Discharge 04/15/2020 1128 by Bethann Punches, RN Outcome: Progressing   Problem: Safety: Goal: Ability to remain free from injury will improve 04/15/2020 1348 by Bethann Punches, RN Outcome: Adequate for Discharge 04/15/2020 1128 by Bethann Punches, RN Outcome: Progressing   Problem: Skin Integrity: Goal: Risk for impaired skin integrity will decrease 04/15/2020 1348 by Bethann Punches, RN Outcome: Adequate for Discharge 04/15/2020 1128 by Bethann Punches, RN Outcome: Progressing   Problem: Education: Goal: Knowledge of Pancreatitis treatment and prevention will improve 04/15/2020 1348 by Bethann Punches, RN Outcome: Adequate for Discharge 04/15/2020 1128 by Bethann Punches, RN Outcome: Progressing   Problem: Health Behavior/Discharge Planning: Goal: Ability to formulate a plan to maintain an alcohol-free life will improve 04/15/2020 1348 by Bethann Punches, RN Outcome: Adequate for Discharge 04/15/2020 1128 by Bethann Punches, RN Outcome: Progressing   Problem: Nutritional: Goal: Ability to achieve adequate nutritional intake will improve 04/15/2020 1348 by Bethann Punches, RN Outcome: Adequate for Discharge 04/15/2020 1128 by Bethann Punches,  RN Outcome: Progressing   Problem: Clinical Measurements: Goal: Complications related to the disease process, condition or treatment will be avoided or minimized 04/15/2020 1348 by Bethann Punches, RN Outcome:  Adequate for Discharge 04/15/2020 1128 by Bethann Punches, RN Outcome: Progressing

## 2020-04-15 NOTE — Progress Notes (Signed)
Called nurse.  Patient tolerated lunch and had BM.  No complaints.  Will work on discharging.  [place] Constable, D.O.  PGY-3 Family Medicine  04/15/2020 1:27 PM

## 2020-04-15 NOTE — Progress Notes (Signed)
Family Medicine Teaching Service Daily Progress Note Intern Pager: [phone]  Patient name: Stephanie Ewing Medical record number: [id number] Date of birth: 1956-02-23 Age: 64 y.o. Gender: female  Primary Care Provider: Monico Blitz, MD Consultants: None Code Status: Limited: yes shocks, yes medications, no compressions, yes intubation (given good prognosis and limited expected tube time, <2wks)  Pt Overview and Major Events to Date:  Admitted 11/30  Assessment and Plan: Stephanie Joy. Lunsfordis a 60 y.o.femalepresenting with one week history of abdominal pain.PMH is significant for hypertension, paroxysmal A. fib, primary hyperaldosteronism,hypothyroidism, asthma, depression, migraine, insomnia, tobacco use.  Abdominal pain likely secondary to acute pancreatitis Patient doing quite well this morning, is in good spirits.  She reports drinking well overnight without issue.  Advance diet this morning at breakfast, ate a full meal with bacon on purpose.  Reports a little bit of mild left-sided abdominal pain but nothing bad enough for her to request pain medication.  She is hopeful to go home today.  Discussed seeing how she tolerates lunch and then likely discharge home afterwards. -Regular diet -Maintenance IV fluids stopped -IV fentanyl discontinued -Oxycodone IR as needed for pain -Plan for discharge this afternoon  Resistant hypertensionprimary hyperaldosteronism: chronic, stable Followed at [facility] At home takes Aldactone 100 mg daily.Reports previously resistant hypertension was treated with up to 4 medications, however has been well controlled s/p hyperaldosteronism diagnosis with spironolactone as the only agent. -IV fluids stopped today given adequate p.o. intake -Likely discharge this afternoon with instructions to resume spironolactone is normal  CKD Stage 3a: Chronic, stable Creatinine upon admission 1.21, trending down today with measurement 1.05.  Suspected  baseline 1.2-1.3.  -Continue to monitor with daily BMPs  Paroxysmal A. Fib No episodes overnight per patient or telemetry. Last episode of A.fib in 2018.Currently on aspirin 81 mg.Last seen by cardiology 12/08/2019,sees cardiologyonce per year. Reports episodes of A. fib in the setting of hypokalemia.Previously on Eliquis but no longer. Cardiology note on 7/26 discussed discontinuation of Eliquis, stating "with recent afib guidelines changing women with CHADS2Vasc score of 2 to a IIB recommendation,and no clear recurrence,reasonable to d/c eliquis at this time". -Continuous telemetry discontinued -Monitor potassium with daily BMP -Continue daily aspirin  Psoriatic arthritis New diagnosis, 6 weeks ago.Takes methotrexate once weekly. Has already taken methotrexate this week, will likely not need while inpatient. -Pain relief as needed  Hypothyroid(Hashimoto's) At home takes levothyroxine 135 mcg daily. -Continue levothyroxine as above  Asthma Patient reports this diagnosis is from "a long time ago". Says that she does not really experience any asthma symptoms. She has an albuterol inhaler at home for PRNuse for wheezing,but "cannot even remember the last time I used it". -Monitor respiratory status -Albuterol as needed  Prediabetes Last A1c 5.7%.No medications at this time.  Depressioninsomnia: chronic At home takescitalopram 20 mg daily, reports "has been on Celexa forever".Also reports chronic insomnia.Takes trazodone sometimes at bedtime.  Tobacco use Per cardiology note 12/08/2019,has beensmoking about ahalf pack a day for 48 years. Total pack-years = 24.Patient estimates pack-years to be 20 at the most. Normal is 1/2 pack a day. -Declines nicotine patch, however added to list asPRNif she requests  FEN/GI: Regular diet PPx: Enoxaparin   Status is: Inpatient  Remains inpatient appropriate because:likely discharge home this  afternoon   Dispo: The patient is from: Home              Anticipated d/c is to: Home  Anticipated d/c date is: 1 day              Patient currently is medically stable to d/c.   Subjective:  Patient found awake and alert laying in bed this morning.  She appears to be in good spirits and happy.  She reports that she did very well overnight and slept well.  She was able to drink without pain overnight.  Last pain medication 10 PM, was p.o. oxycodone which gave her itching.  However, has not needed pain medication since.  Advance diet this morning with breakfast.  Patient ordered full breakfast including bacon on purpose "accelerated test myself".  At time of discussion, was s/p breakfast by 1 hour and doing well.  Patient reported some mild left-sided abdominal pain but nothing bad enough for her to ask for pain medication.  After some discussion, she is amenable to the plan of watching how she does tolerating lunch with likely discharge this afternoon.  Objective: Temp:  [97.7 F (36.5 C)-98.3 F (36.8 C)] 97.7 F (36.5 C) (12/02 0515) Pulse Rate:  [63-70] 66 (12/02 0515) Resp:  [16-18] 18 (12/02 0515) BP: (116-131)/(59-64) 116/61 (12/02 0515) SpO2:  [95 %-98 %] 95 % (12/02 0515)  Physical Exam: General: Awake, alert, no acute distress, oriented Abdomen: Mild TTP in epigastrium and LUQ, no rebound tenderness Extremities: No BLE edema  Laboratory: Recent Labs  Lab 04/13/20 1000 04/14/20 0040 04/15/20 0410  WBC 9.4 8.0 5.8  HGB 15.0 13.3 12.4  HCT 46.1* 41.2 35.7*  PLT 287 237 220   Recent Labs  Lab 04/13/20 1000 04/14/20 0040 04/15/20 0410  NA 139 139 142  K 4.4 3.8 4.2  CL 102 102 103  CO2 24 25 27   BUN 21 16 12   CREATININE 1.21* 1.12* 1.05*  CALCIUM 10.1 9.6 9.6  PROT 7.5 6.3*  --   BILITOT 0.6 0.7  --   ALKPHOS 68 54  --   ALT 29 23  --   AST 19 14*  --   GLUCOSE 105* 92 100*    Imaging/Diagnostic Tests: No results found.   Stephanie Essex,  MD 04/15/2020, 11:33 AM PGY-1, [place] Intern pager: [phone], text pages welcome

## 2020-04-19 ENCOUNTER — Ambulatory Visit
Admission: RE | Admit: 2020-04-19 | Discharge: 2020-04-19 | Disposition: A | Discharge status: Home / Self Care | Payer: Medicare Other | Source: Ambulatory Visit | Attending: Internal Medicine | Admitting: Internal Medicine

## 2020-04-19 ENCOUNTER — Other Ambulatory Visit: Payer: Self-pay | Admitting: Internal Medicine

## 2020-04-19 ENCOUNTER — Other Ambulatory Visit: Payer: Self-pay

## 2020-04-19 DIAGNOSIS — Z1231 Encounter for screening mammogram for malignant neoplasm of breast: Secondary | ICD-10-CM

## 2020-04-19 IMAGING — MG DIGITAL SCREENING BILAT W/ TOMO W/ CAD
8 series · 8 of 24 positions shown · non-contrast
Comparison: None.

CLINICAL DATA: Screening.

EXAM:
DIGITAL SCREENING BILATERAL MAMMOGRAM WITH TOMO AND CAD

[L MLO synth-2D]
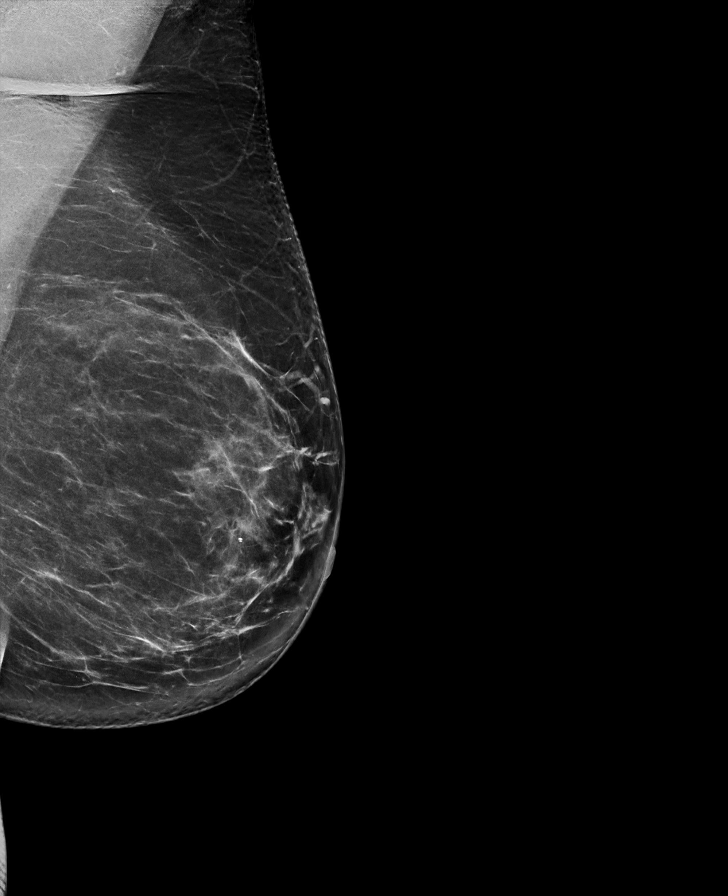

[R CC synth-2D]
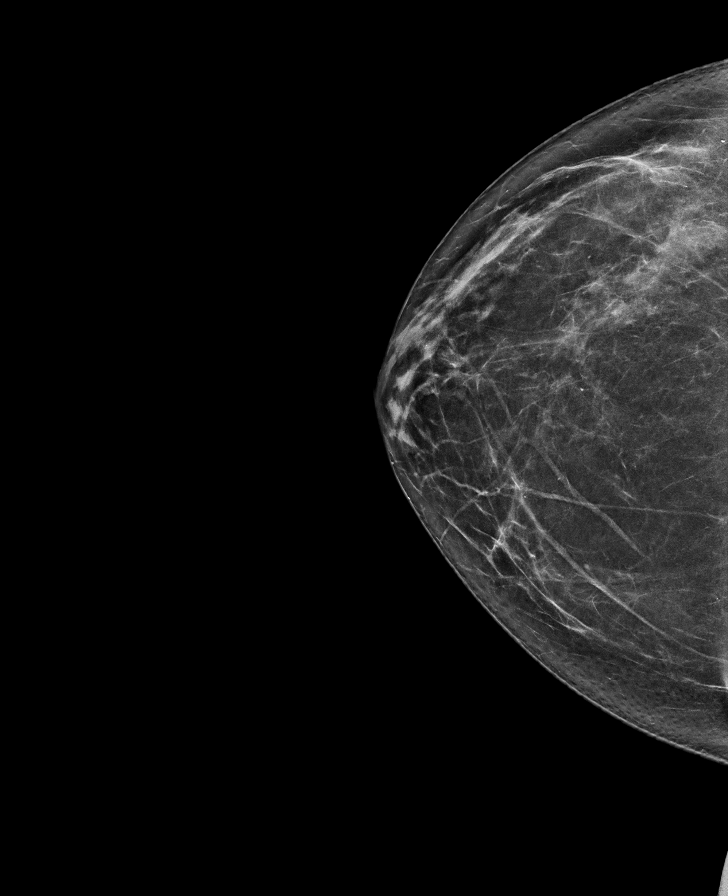

[R MLO synth-2D]
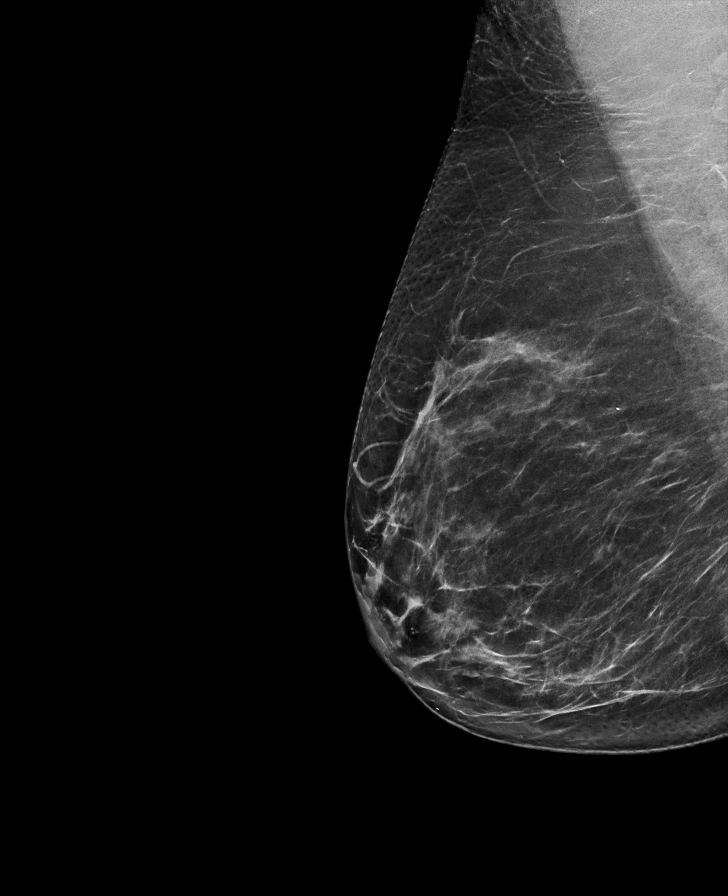

[L CC synth-2D]
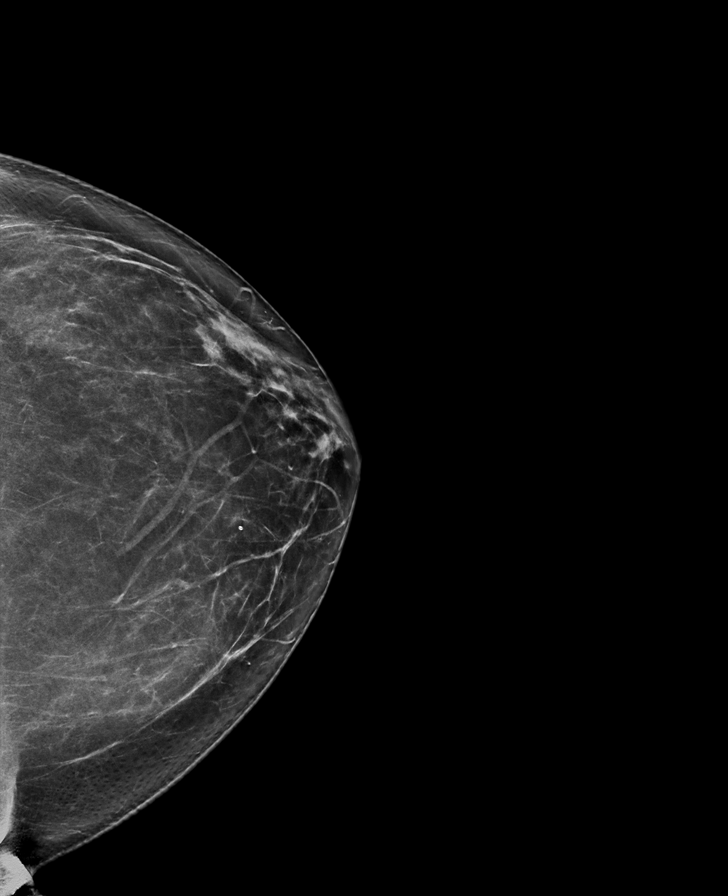

[L MLO tomo · tomo slice 43/84.0]
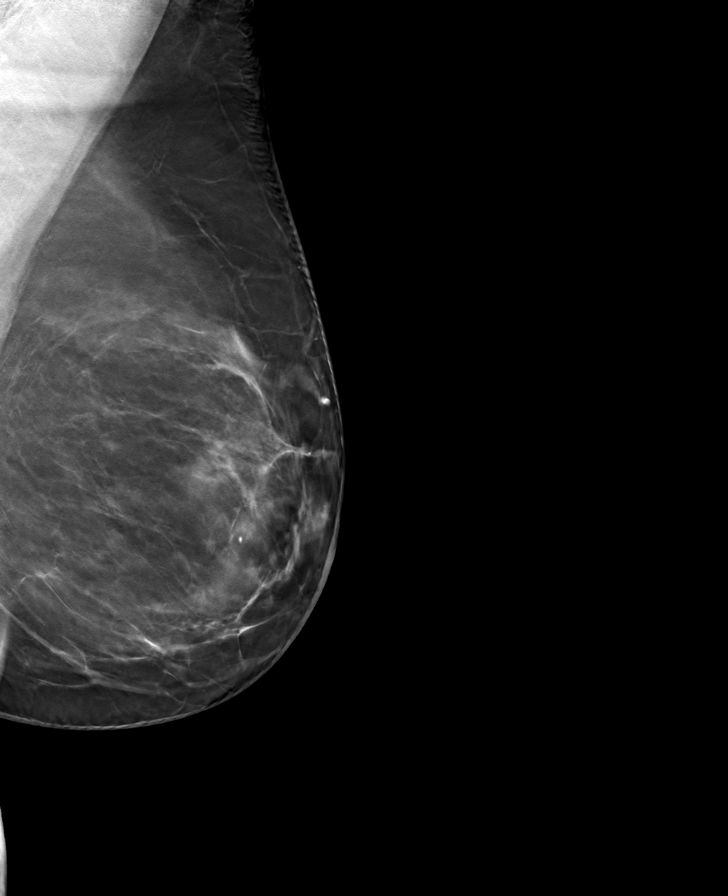

[R CC tomo · tomo slice 35/70.0]
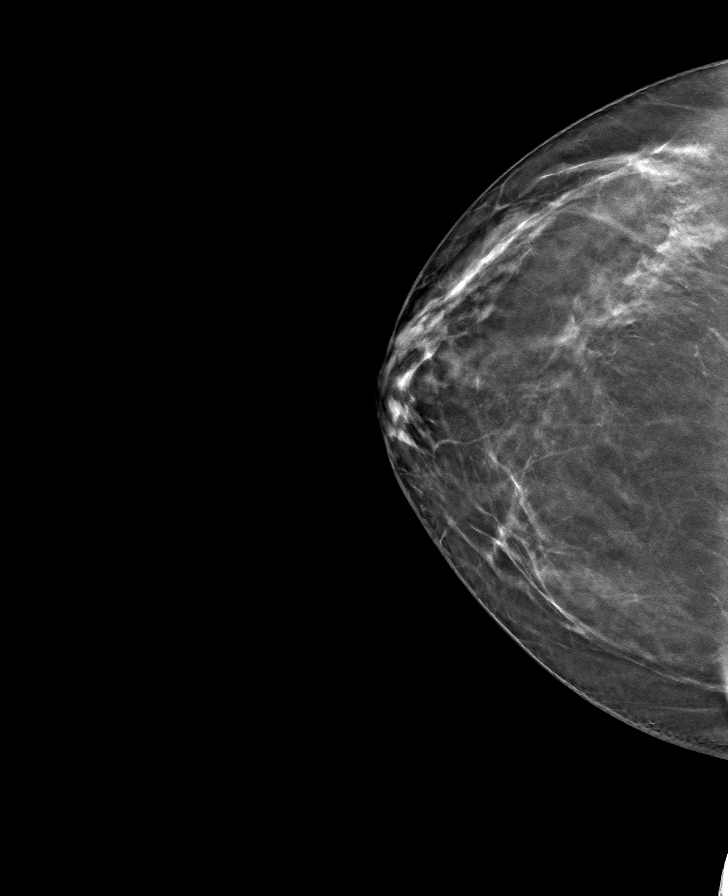

[L CC tomo · tomo slice 39/78.0]
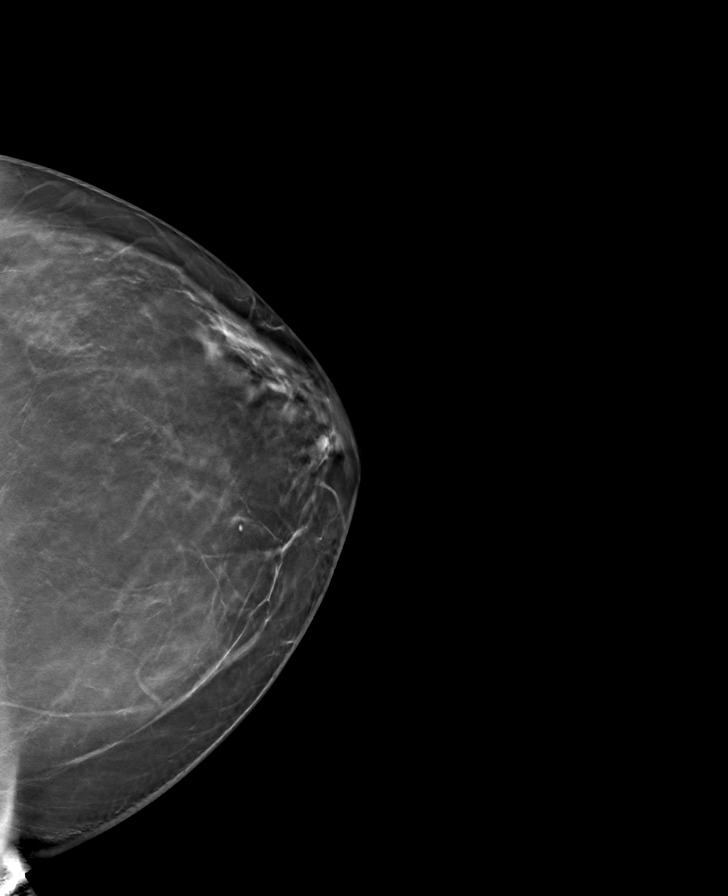

[R MLO tomo · tomo slice 39/77.0]
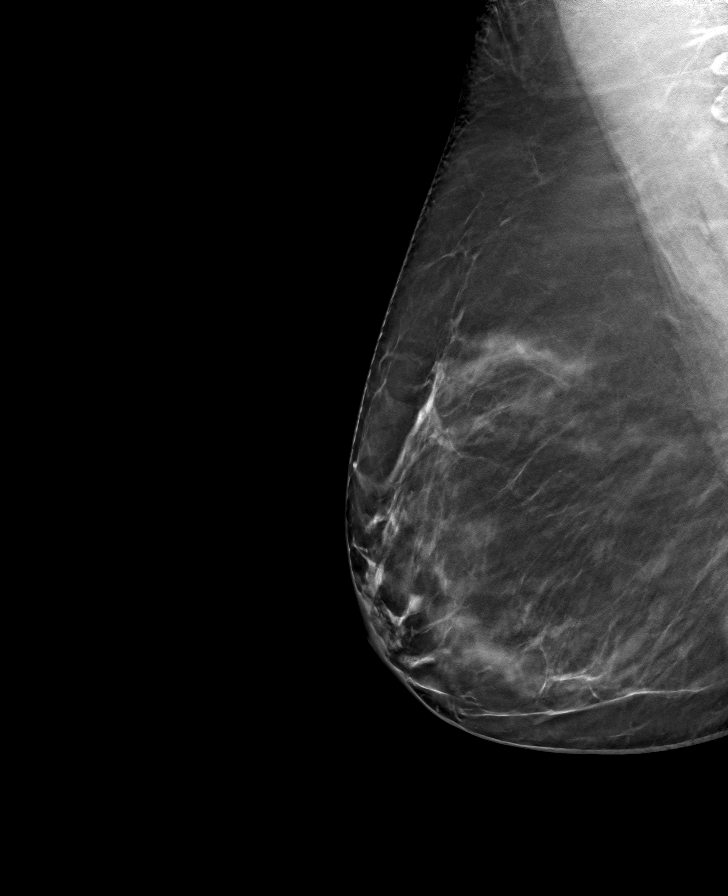

[8 of 24 positions shown; findings below may reference images not displayed]

ACR Breast Density Category b: There are scattered areas of
fibroglandular density.
FINDINGS: There are no findings suspicious for malignancy. Images were
processed with CAD.
IMPRESSION: No mammographic evidence of malignancy. A result letter of this
screening mammogram will be mailed directly to the patient.

RECOMMENDATION:
Screening mammogram in one year. (Code:[1T])

BI-RADS CATEGORY  1: Negative.

## 2020-05-05 ENCOUNTER — Inpatient Hospital Stay (HOSPITAL_COMMUNITY)
Admission: EM | Admit: 2020-05-05 | Discharge: 2020-05-10 | DRG: 439 | Disposition: A | Discharge status: Home / Self Care | Payer: Medicare Other | Attending: Family Medicine | Admitting: Family Medicine

## 2020-05-05 ENCOUNTER — Encounter (HOSPITAL_COMMUNITY): Payer: Self-pay | Admitting: Emergency Medicine

## 2020-05-05 ENCOUNTER — Other Ambulatory Visit: Payer: Self-pay

## 2020-05-05 DIAGNOSIS — L299 Pruritus, unspecified: Secondary | ICD-10-CM | POA: Diagnosis not present

## 2020-05-05 DIAGNOSIS — Z800 Family history of malignant neoplasm of digestive organs: Secondary | ICD-10-CM

## 2020-05-05 DIAGNOSIS — E7439 Other disorders of intestinal carbohydrate absorption: Secondary | ICD-10-CM | POA: Diagnosis present

## 2020-05-05 DIAGNOSIS — Z8349 Family history of other endocrine, nutritional and metabolic diseases: Secondary | ICD-10-CM

## 2020-05-05 DIAGNOSIS — Z79899 Other long term (current) drug therapy: Secondary | ICD-10-CM

## 2020-05-05 DIAGNOSIS — Z885 Allergy status to narcotic agent status: Secondary | ICD-10-CM

## 2020-05-05 DIAGNOSIS — R748 Abnormal levels of other serum enzymes: Secondary | ICD-10-CM | POA: Diagnosis present

## 2020-05-05 DIAGNOSIS — Z6828 Body mass index (BMI) 28.0-28.9, adult: Secondary | ICD-10-CM

## 2020-05-05 DIAGNOSIS — J45909 Unspecified asthma, uncomplicated: Secondary | ICD-10-CM | POA: Diagnosis present

## 2020-05-05 DIAGNOSIS — E663 Overweight: Secondary | ICD-10-CM | POA: Diagnosis present

## 2020-05-05 DIAGNOSIS — K859 Acute pancreatitis without necrosis or infection, unspecified: Secondary | ICD-10-CM | POA: Diagnosis not present

## 2020-05-05 DIAGNOSIS — Z833 Family history of diabetes mellitus: Secondary | ICD-10-CM

## 2020-05-05 DIAGNOSIS — Z20822 Contact with and (suspected) exposure to covid-19: Secondary | ICD-10-CM | POA: Diagnosis present

## 2020-05-05 DIAGNOSIS — R042 Hemoptysis: Secondary | ICD-10-CM

## 2020-05-05 DIAGNOSIS — Z90710 Acquired absence of both cervix and uterus: Secondary | ICD-10-CM

## 2020-05-05 DIAGNOSIS — Z8249 Family history of ischemic heart disease and other diseases of the circulatory system: Secondary | ICD-10-CM

## 2020-05-05 DIAGNOSIS — E063 Autoimmune thyroiditis: Secondary | ICD-10-CM | POA: Diagnosis present

## 2020-05-05 DIAGNOSIS — E876 Hypokalemia: Secondary | ICD-10-CM | POA: Diagnosis present

## 2020-05-05 DIAGNOSIS — K219 Gastro-esophageal reflux disease without esophagitis: Secondary | ICD-10-CM | POA: Diagnosis present

## 2020-05-05 DIAGNOSIS — K8500 Idiopathic acute pancreatitis without necrosis or infection: Secondary | ICD-10-CM

## 2020-05-05 DIAGNOSIS — Z825 Family history of asthma and other chronic lower respiratory diseases: Secondary | ICD-10-CM

## 2020-05-05 DIAGNOSIS — F17210 Nicotine dependence, cigarettes, uncomplicated: Secondary | ICD-10-CM | POA: Diagnosis present

## 2020-05-05 DIAGNOSIS — Z808 Family history of malignant neoplasm of other organs or systems: Secondary | ICD-10-CM

## 2020-05-05 DIAGNOSIS — N1831 Chronic kidney disease, stage 3a: Secondary | ICD-10-CM | POA: Diagnosis present

## 2020-05-05 DIAGNOSIS — F5104 Psychophysiologic insomnia: Secondary | ICD-10-CM | POA: Diagnosis present

## 2020-05-05 DIAGNOSIS — F32A Depression, unspecified: Secondary | ICD-10-CM | POA: Diagnosis present

## 2020-05-05 DIAGNOSIS — Z7982 Long term (current) use of aspirin: Secondary | ICD-10-CM

## 2020-05-05 DIAGNOSIS — I129 Hypertensive chronic kidney disease with stage 1 through stage 4 chronic kidney disease, or unspecified chronic kidney disease: Secondary | ICD-10-CM | POA: Diagnosis present

## 2020-05-05 DIAGNOSIS — L4050 Arthropathic psoriasis, unspecified: Secondary | ICD-10-CM | POA: Diagnosis present

## 2020-05-05 DIAGNOSIS — I480 Paroxysmal atrial fibrillation: Secondary | ICD-10-CM | POA: Diagnosis present

## 2020-05-05 DIAGNOSIS — E269 Hyperaldosteronism, unspecified: Secondary | ICD-10-CM | POA: Diagnosis present

## 2020-05-05 DIAGNOSIS — N179 Acute kidney failure, unspecified: Secondary | ICD-10-CM | POA: Diagnosis present

## 2020-05-05 DIAGNOSIS — R7303 Prediabetes: Secondary | ICD-10-CM | POA: Diagnosis present

## 2020-05-05 DIAGNOSIS — E2609 Other primary hyperaldosteronism: Secondary | ICD-10-CM | POA: Diagnosis present

## 2020-05-05 DIAGNOSIS — Z79890 Hormone replacement therapy (postmenopausal): Secondary | ICD-10-CM

## 2020-05-05 LAB — CBC
HCT: 43.1 % (ref 36.0–46.0)
Hemoglobin: 13.8 g/dL (ref 12.0–15.0)
MCH: 30.5 pg (ref 26.0–34.0)
MCHC: 32 g/dL (ref 30.0–36.0)
MCV: 95.1 fL (ref 80.0–100.0)
Platelets: 298 10*3/uL (ref 150–400)
RBC: 4.53 MIL/uL (ref 3.87–5.11)
RDW: 13.1 % (ref 11.5–15.5)
WBC: 11.7 10*3/uL — ABNORMAL HIGH (ref 4.0–10.5)
nRBC: 0 % (ref 0.0–0.2)

## 2020-05-05 LAB — URINALYSIS, ROUTINE W REFLEX MICROSCOPIC
Bilirubin Urine: NEGATIVE
Glucose, UA: NEGATIVE mg/dL
Ketones, ur: NEGATIVE mg/dL
Nitrite: NEGATIVE
Protein, ur: NEGATIVE mg/dL
Specific Gravity, Urine: 1.008 (ref 1.005–1.030)
pH: 5 (ref 5.0–8.0)

## 2020-05-05 NOTE — ED Triage Notes (Signed)
Pt c/o LUQ pain with nausea that started yesterday. Recent hx pancreatitis.

## 2020-05-06 ENCOUNTER — Emergency Department (HOSPITAL_COMMUNITY): Payer: Medicare Other

## 2020-05-06 DIAGNOSIS — K859 Acute pancreatitis without necrosis or infection, unspecified: Secondary | ICD-10-CM | POA: Diagnosis present

## 2020-05-06 LAB — COMPREHENSIVE METABOLIC PANEL
ALT: 26 U/L (ref 0–44)
AST: 18 U/L (ref 15–41)
Albumin: 3.7 g/dL (ref 3.5–5.0)
Alkaline Phosphatase: 59 U/L (ref 38–126)
Anion gap: 10 (ref 5–15)
BUN: 21 mg/dL (ref 8–23)
CO2: 24 mmol/L (ref 22–32)
Calcium: 9.7 mg/dL (ref 8.9–10.3)
Chloride: 102 mmol/L (ref 98–111)
Creatinine, Ser: 1.5 mg/dL — ABNORMAL HIGH (ref 0.44–1.00)
GFR, Estimated: 38 mL/min — ABNORMAL LOW (ref >60)
Glucose, Bld: 113 mg/dL — ABNORMAL HIGH (ref 70–99)
Potassium: 4.2 mmol/L (ref 3.5–5.1)
Sodium: 136 mmol/L (ref 135–145)
Total Bilirubin: 0.6 mg/dL (ref 0.3–1.2)
Total Protein: 6.8 g/dL (ref 6.5–8.1)

## 2020-05-06 LAB — BASIC METABOLIC PANEL
Anion gap: 8 (ref 5–15)
BUN: 13 mg/dL (ref 8–23)
CO2: 28 mmol/L (ref 22–32)
Calcium: 9.4 mg/dL (ref 8.9–10.3)
Chloride: 102 mmol/L (ref 98–111)
Creatinine, Ser: 1.18 mg/dL — ABNORMAL HIGH (ref 0.44–1.00)
GFR, Estimated: 51 mL/min — ABNORMAL LOW (ref >60)
Glucose, Bld: 131 mg/dL — ABNORMAL HIGH (ref 70–99)
Potassium: 3.8 mmol/L (ref 3.5–5.1)
Sodium: 138 mmol/L (ref 135–145)

## 2020-05-06 LAB — CBC
HCT: 40 % (ref 36.0–46.0)
Hemoglobin: 12.7 g/dL (ref 12.0–15.0)
MCH: 30 pg (ref 26.0–34.0)
MCHC: 31.8 g/dL (ref 30.0–36.0)
MCV: 94.6 fL (ref 80.0–100.0)
Platelets: 246 10*3/uL (ref 150–400)
RBC: 4.23 MIL/uL (ref 3.87–5.11)
RDW: 13.2 % (ref 11.5–15.5)
WBC: 7.1 10*3/uL (ref 4.0–10.5)
nRBC: 0 % (ref 0.0–0.2)

## 2020-05-06 LAB — LIPASE, BLOOD: Lipase: 74 U/L — ABNORMAL HIGH (ref 11–51)

## 2020-05-06 LAB — RESP PANEL BY RT-PCR (FLU A&B, COVID) ARPGX2
Influenza A by PCR: NEGATIVE
Influenza B by PCR: NEGATIVE
SARS Coronavirus 2 by RT PCR: NEGATIVE

## 2020-05-06 IMAGING — CT CT ABD-PELV W/ CM
4 of 5 series · 13 of 46 positions shown, 18 images · IV contrast (APPLIED)
Comparison: [DATE]

CLINICAL DATA: Elevated lipase, suspected pancreatitis

EXAM:
CT ABDOMEN AND PELVIS WITH CONTRAST
TECHNIQUE: Multidetector CT imaging of the abdomen and pelvis was performed
using the standard protocol following bolus administration of
intravenous contrast. Sagittal and coronal MPR images reconstructed
from axial data set.
CONTRAST:  80mL OMNIPAQUE IOHEXOL 300 MG/ML SOLN IV. No oral
contrast.

[Series 4: abdomen 5.0 · axial · 0.81mm/px · z∈[+718,+1078]mm · 7 of 97 slices shown, 12 images]
[im 13/97  soft-tissue]
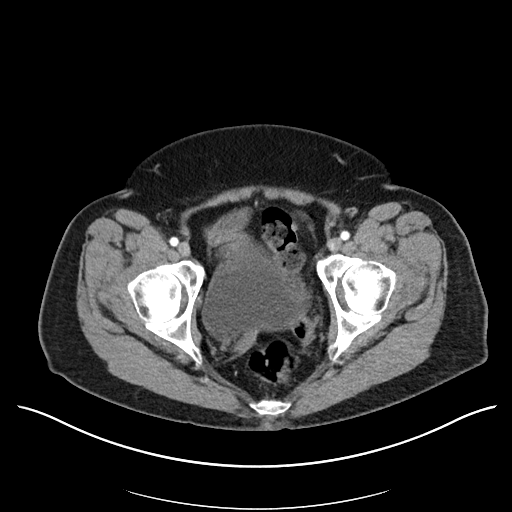
[im 13/97  bone]
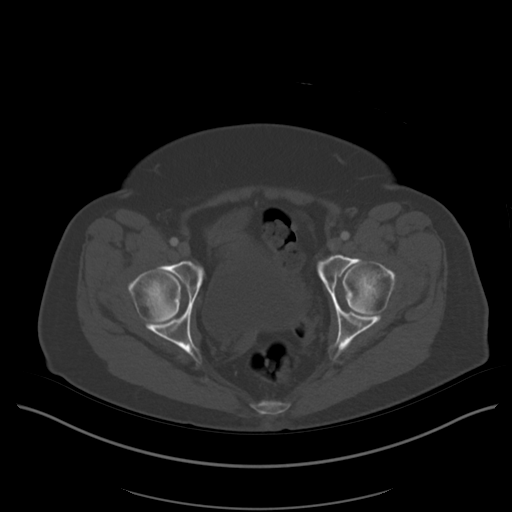
[im 25/97  soft-tissue]
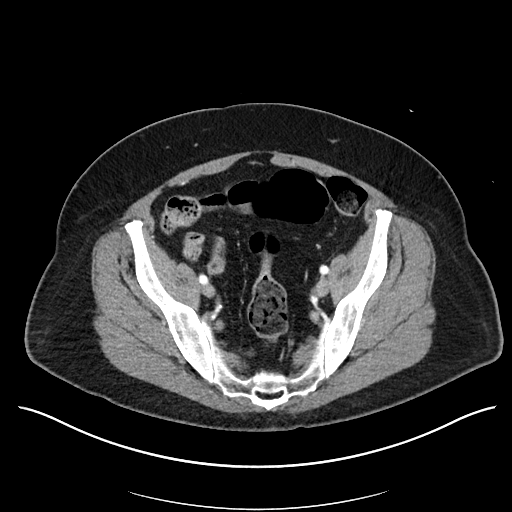
[im 37/97  soft-tissue]
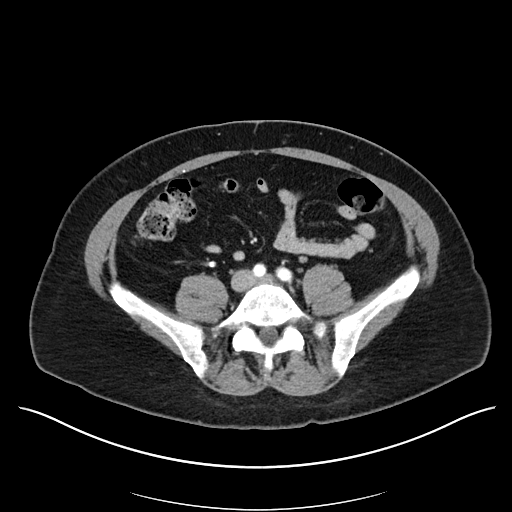
[im 49/97  soft-tissue]
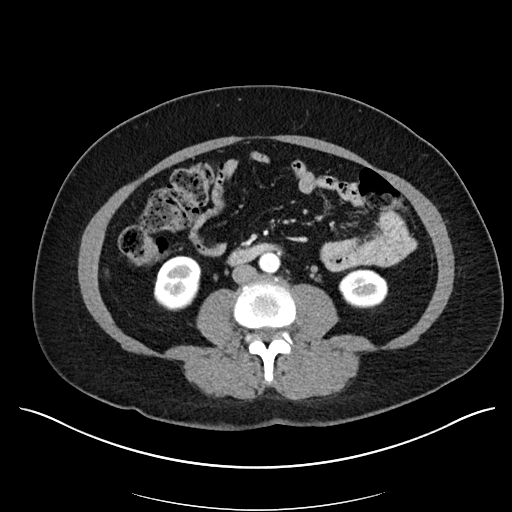
[im 49/97  lung]
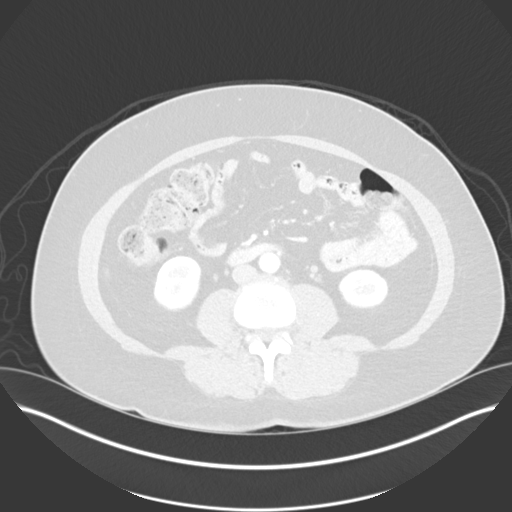
[im 61/97  soft-tissue]
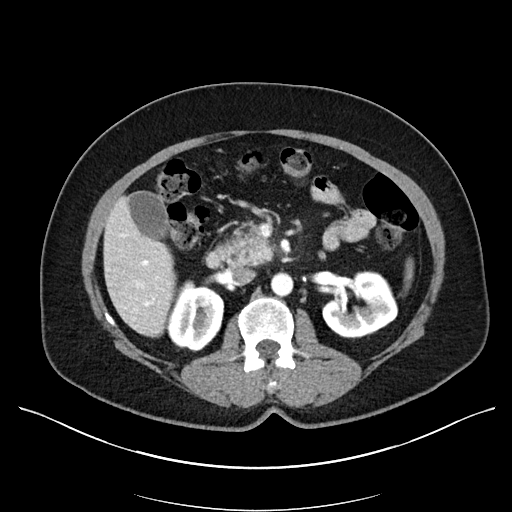
[im 61/97  lung]
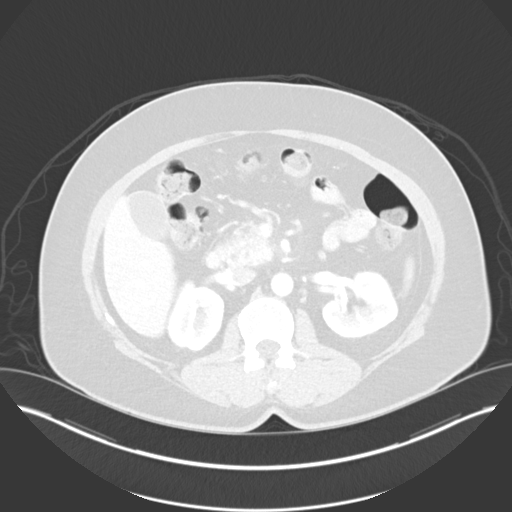
[im 73/97  soft-tissue]
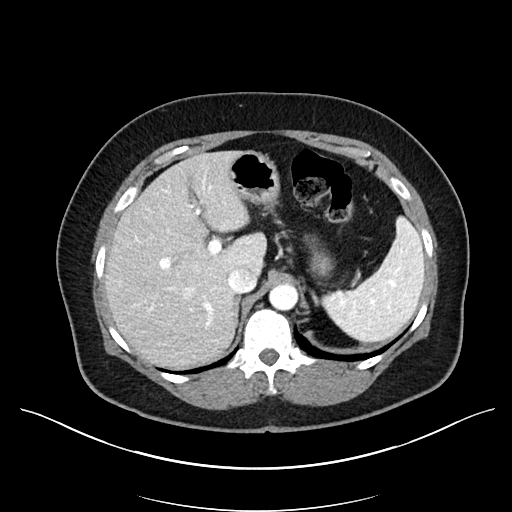
[im 73/97  lung]
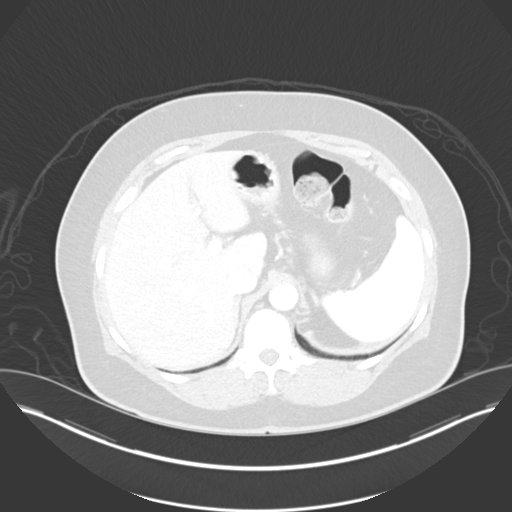
[im 85/97  soft-tissue]
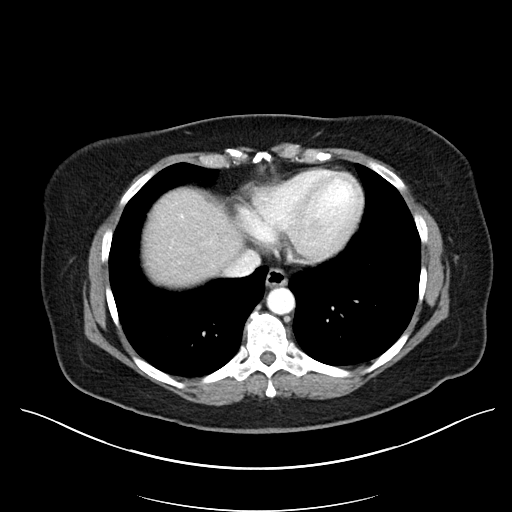
[im 85/97  lung]
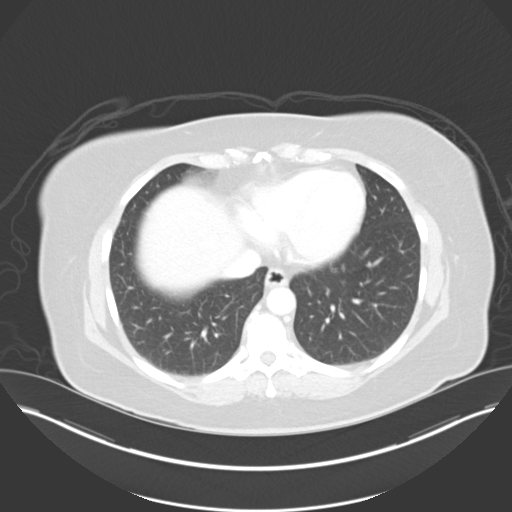

[Series 6: lung · axial · 0.81mm/px · z∈[+708,+758]mm · 2 of 241 slices shown]
[im 26/241  bone]
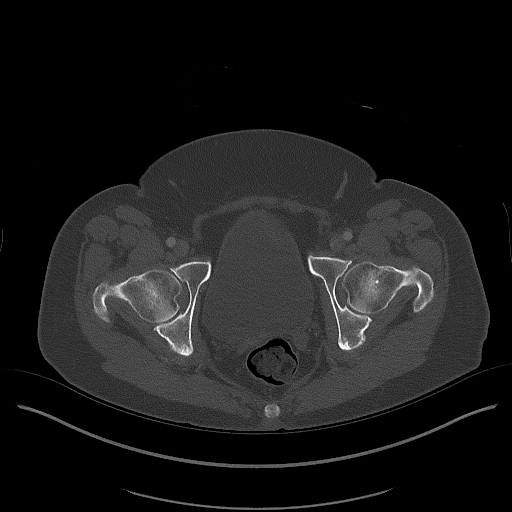
[im 51/241  bone]
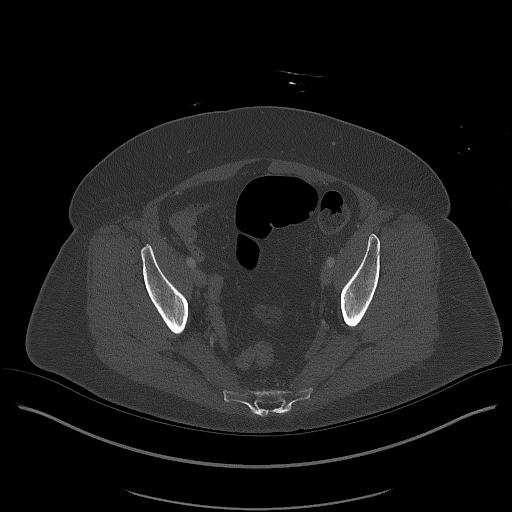

[Series 7: abdomen 3.0 mpr cor · coronal · 0.77mm/px · 3 of 97 slices shown]
[im 33/97  soft-tissue]
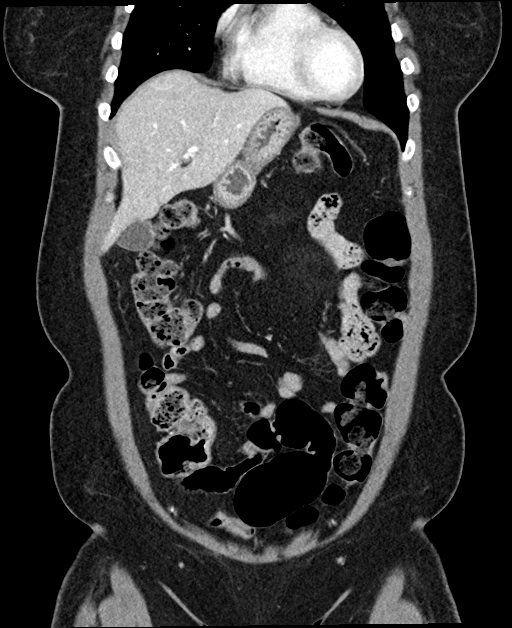
[im 43/97  soft-tissue]
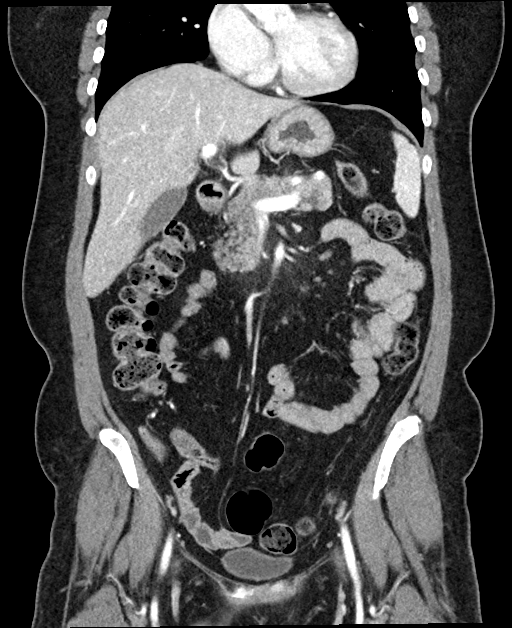
[im 54/97  soft-tissue]
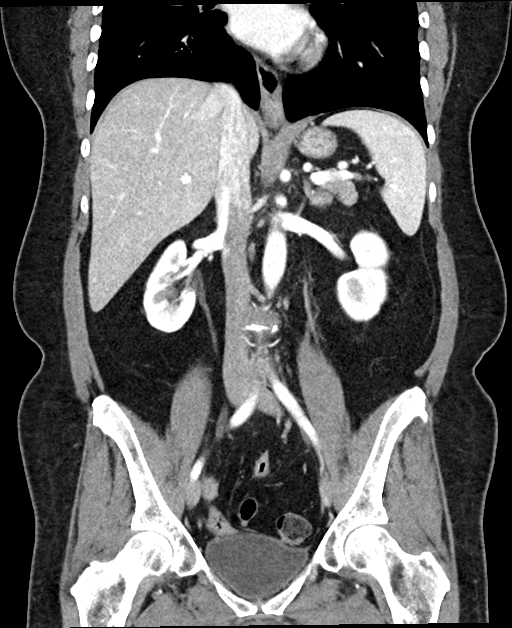

[Series 8: abdomen 3.0 mpr sag · sagittal · 0.56mm/px · 1 of 133 slices shown]
[im 45/133  soft-tissue]
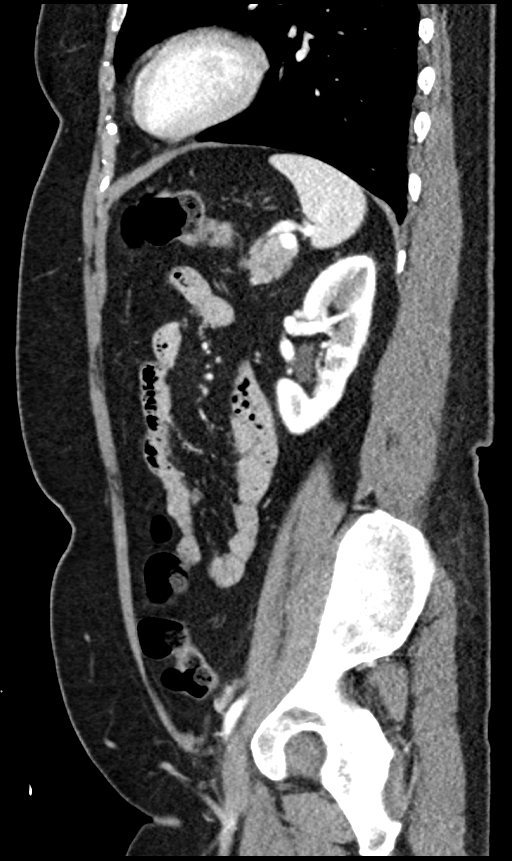

[13 of 46 positions shown; findings below may reference images not displayed]

FINDINGS: Lower chest: Lung bases clear

Hepatobiliary: Gallbladder and liver normal appearance. No biliary
dilatation.

Pancreas: Mild hazy peripancreatic infiltration at pancreatic head
consistent with mild focal pancreatitis. Clean peripancreatic fat
planes at body and tail. No mass, ductal dilatation, calcification
or hemorrhage.

Spleen: Normal appearance

Adrenals/Urinary Tract: Adrenal glands, kidneys, ureters, and
bladder normal appearance

Stomach/Bowel: Normal appendix. Stomach and bowel loops normal
appearance

Vascular/Lymphatic: Aorta normal caliber. Vascular structures
patent. No adenopathy.

Reproductive: Uterus surgically absent. Nonvisualization of ovaries.

Other: No free air or free fluid. No hernia or inflammatory process.

Musculoskeletal: Unremarkable
IMPRESSION: Mild hazy peripancreatic infiltration at pancreatic head consistent
with focal pancreatitis.

No complications of acute pancreatitis identified.

Remainder of exam unremarkable.

## 2020-05-06 MED ORDER — LEVOTHYROXINE SODIUM 125 MCG PO TABS
125.0000 ug | ORAL_TABLET | Freq: Every day | ORAL | Status: DC
  Administered 2020-05-07 – 2020-05-10 (×4): 125 ug via ORAL
  Filled 2020-05-06 (×4): qty 1

## 2020-05-06 MED ORDER — CITALOPRAM HYDROBROMIDE 20 MG PO TABS
20.0000 mg | ORAL_TABLET | Freq: Every day | ORAL | Status: DC
  Administered 2020-05-06 – 2020-05-10 (×5): 20 mg via ORAL
  Filled 2020-05-06 (×5): qty 1
  Filled 2020-05-06: qty 2

## 2020-05-06 MED ORDER — SODIUM CHLORIDE 0.9 % IV SOLN: Freq: Once | INTRAVENOUS | Status: AC

## 2020-05-06 MED ORDER — IOHEXOL 300 MG/ML  SOLN
80.0000 mL | Freq: Once | INTRAMUSCULAR | Status: AC | PRN
  Administered 2020-05-06: 13:00:00 80 mL via INTRAVENOUS

## 2020-05-06 MED ORDER — ONDANSETRON HCL 4 MG PO TABS
4.0000 mg | ORAL_TABLET | Freq: Three times a day (TID) | ORAL | Status: DC | PRN
  Administered 2020-05-08: 20:00:00 4 mg via ORAL
  Filled 2020-05-06: qty 1

## 2020-05-06 MED ORDER — LORATADINE 10 MG PO TABS
10.0000 mg | ORAL_TABLET | Freq: Every day | ORAL | Status: DC
  Administered 2020-05-06 – 2020-05-10 (×5): 10 mg via ORAL
  Filled 2020-05-06 (×5): qty 1

## 2020-05-06 MED ORDER — MONTELUKAST SODIUM 10 MG PO TABS
10.0000 mg | ORAL_TABLET | Freq: Every day | ORAL | Status: DC
  Administered 2020-05-06: 16:00:00 10 mg via ORAL
  Filled 2020-05-06: qty 1

## 2020-05-06 MED ORDER — ASPIRIN EC 81 MG PO TBEC
81.0000 mg | DELAYED_RELEASE_TABLET | Freq: Every day | ORAL | Status: DC
  Administered 2020-05-06 – 2020-05-09 (×4): 81 mg via ORAL
  Filled 2020-05-06 (×5): qty 1

## 2020-05-06 MED ORDER — ONDANSETRON HCL 4 MG/2ML IJ SOLN
4.0000 mg | Freq: Once | INTRAMUSCULAR | Status: AC
  Administered 2020-05-06: 11:00:00 4 mg via INTRAVENOUS
  Filled 2020-05-06: qty 2

## 2020-05-06 MED ORDER — MORPHINE SULFATE (PF) 4 MG/ML IV SOLN
4.0000 mg | Freq: Once | INTRAVENOUS | Status: AC
  Administered 2020-05-06: 11:00:00 4 mg via INTRAVENOUS
  Filled 2020-05-06: qty 1

## 2020-05-06 MED ORDER — ACETAMINOPHEN 325 MG PO TABS: 650.0000 mg | ORAL_TABLET | Freq: Four times a day (QID) | ORAL | Status: DC | PRN

## 2020-05-06 MED ORDER — LACTATED RINGERS IV SOLN: INTRAVENOUS | Status: DC

## 2020-05-06 MED ORDER — SALINE SPRAY 0.65 % NA SOLN
1.0000 | NASAL | Status: DC | PRN
  Filled 2020-05-06: qty 44

## 2020-05-06 MED ORDER — MORPHINE SULFATE (PF) 2 MG/ML IV SOLN
2.0000 mg | Freq: Once | INTRAVENOUS | Status: AC
  Administered 2020-05-06: 15:00:00 2 mg via INTRAVENOUS
  Filled 2020-05-06: qty 1

## 2020-05-06 MED ORDER — GABAPENTIN 300 MG PO CAPS
300.0000 mg | ORAL_CAPSULE | Freq: Three times a day (TID) | ORAL | Status: DC
  Administered 2020-05-06 – 2020-05-10 (×12): 300 mg via ORAL
  Filled 2020-05-06 (×12): qty 1

## 2020-05-06 MED ORDER — SPIRONOLACTONE 25 MG PO TABS: 100.0000 mg | ORAL_TABLET | Freq: Every day | ORAL | Status: DC

## 2020-05-06 MED ORDER — SODIUM CHLORIDE 0.9 % IV BOLUS
1000.0000 mL | Freq: Once | INTRAVENOUS | Status: AC
  Administered 2020-05-06: 11:00:00 1000 mL via INTRAVENOUS

## 2020-05-06 MED ORDER — FAMOTIDINE 20 MG PO TABS: 20.0000 mg | ORAL_TABLET | Freq: Every day | ORAL | Status: DC | PRN

## 2020-05-06 MED ORDER — CYCLOSPORINE 0.05 % OP EMUL
1.0000 [drp] | Freq: Two times a day (BID) | OPHTHALMIC | Status: DC
  Administered 2020-05-07 – 2020-05-10 (×7): 1 [drp] via OPHTHALMIC
  Filled 2020-05-06 (×9): qty 1

## 2020-05-06 MED ORDER — TRAZODONE HCL 50 MG PO TABS
150.0000 mg | ORAL_TABLET | Freq: Every day | ORAL | Status: DC
  Administered 2020-05-06 – 2020-05-09 (×4): 150 mg via ORAL
  Filled 2020-05-06 (×4): qty 1

## 2020-05-06 MED ORDER — MORPHINE SULFATE (PF) 2 MG/ML IV SOLN
2.0000 mg | INTRAVENOUS | Status: DC | PRN
  Administered 2020-05-06 – 2020-05-07 (×3): 2 mg via INTRAVENOUS
  Filled 2020-05-06 (×3): qty 1

## 2020-05-06 MED ORDER — ENOXAPARIN SODIUM 40 MG/0.4ML ~~LOC~~ SOLN
40.0000 mg | SUBCUTANEOUS | Status: DC
  Administered 2020-05-06 – 2020-05-09 (×4): 40 mg via SUBCUTANEOUS
  Filled 2020-05-06 (×4): qty 0.4

## 2020-05-06 NOTE — Hospital Course (Addendum)
Stephanie Ewing is a 64 y.o. female presenting with a second episode of acute pancreatitis. PMH is significant for acute pancreatitis on 11/30, hypertension, paroxysmal A. fib, primary hyperaldosteronism, hypothyroidism, asthma, depression, migraine, insomnia, tobacco use. Below is her brief hospital course listed by problem.   Acute Pancreatitis Patient presented with upper abdominal pain and back pain and was found to have another episode of acute pancreatitis after eating holiday foods such as "sausage balls" . Since her last discharge for pancreatitis, she has not been able to establish with GI and her rheumatologist has stopped her MTX due to concern it cold trigger her inflammation. Patient was evaluated by GI who recommended hydration and pain management as well as checking IgG4 level.  On 12/26, she was able to tolerate PO pain management and oral feeding/hydration. D/c with Oxy IR for pain control and instructions to follow up with GI outpatient. IgG4 still pending at time of discharge.   Hemoptysis  Patient developed hemoptysis without chest pain or fever. She underwent imaging of her chest with an xray that did not show any abnormalities. Additionally had CTA that was negative for PE. Emphasized smoking cessation to help with chronic cough and decrease malignancy risk.   Smoking Cessation  Patient will need outpatient follow up for cessation methods/assistance, smoking 12 cigarettes per day.   Issue for Follow up  Follow up IgG 4 labs (ordered by GI) Patient will need GI follow up as outpatient, may need MRCP or EUS Monitor Cr with BMP at follow up due to recent IV contrast  Monitor pain 2/2 acute pancreatitis Episodes of hemoptysis- with negative CXR and CTA. Consider outpatient pulm referral for a flex bronchoscopy if continues Follow up sputum culture for hemoptysis  Restart spironolactone if BMP ok  Continue to hold Methotrexate  Hold Singulair due to increased risk of  pancreatitis

## 2020-05-06 NOTE — H&P (Addendum)
Stephanie Ewing Admission History and Physical Service Pager: [phone]  Patient name: Stephanie Ewing Medical record number: [id number] Date of birth: 08-Aug-1955 Age: 64 y.o. Gender: female  Primary Care Provider: Monico Blitz, MD Consultants: GI Code Status: Limited: yes shocks, yes medications, no compressions, yes intubation (given good prognosis and limited expected tube time, <2wks) Preferred Emergency Contact: Clothilde Toriz (spouse): [phone]  Chief Complaint: stomach and back pain   Assessment and Plan: Stephanie Ewing is a 64 y.o. female presenting with stomach and back pain 2/2 to second episode of pancreatitis . PMH is significant for acute pancreatitis on 11/30, hypertension, paroxysmal A. fib, primary hyperaldosteronism, hypothyroidism, asthma, depression, migraine, insomnia, tobacco use.  Acute pancreatitis  Patient presented to ED for about 36 hours of abdominal and back pain after eating sausage balls. She was previously admitted on 11/30-12/02 for abdominal pain and was found to have acute pancreatitis.  She feels that her pain from her pancreatitis fully resolved.  She reports one episode of emesis 2 days ago.  She reports pain under her ribs bilaterally, and back pain that moves from the lumbar area to her neck.  Afebrile and hemodynamically stable.  Lipase slightly elevated at 74. On previous admission lipase wnl. CT abdomen pelvis: mild hazy peripancreatic infiltration at pancreatic head consistent with focal pancreatitis.RUQ [place] on previous admission shows no abnormalities, no cholelithiasis. Patient has family history of pancreatic cancer in great grandfather, was his cause of death. Patient is a current smoker with a 20-pack-year history (approximately half pack per day x40 years), but does not drink often and has no history of heavy drinking. No history of gallstones. Recent lipid panel 11/30 wnl. Admitting for pain control, hydration and  further work up for cause of pancreatitis.  -Admit to [facility] with Dr. Andria Frames attending -Vitals per floor routine -GI following, appreciate recommendations -Clear liquids, advance to low-fat diet as tolerated -Tylenol 650 mg q 6 hrs -Morphine IV  2mg  Q4PRN -LR mIVF 255ml/hr for 10 hours, 152ml/hr after   Acute on chronic kidney disease, Stage 3a Cr elevated at 1.5. Baseline per care everywhere between 0.9-1.1 - mIVF as above  -Hold spironolactone while on mIVF  Paroxysmal A. Fib Last episode of A.fib in 2018. Home meds: aspirin 81 mg, Diltiazem 30 mg as needed.  Last seen by cardiology 12/08/2019, sees cardiology once per year. Reports episodes of A. fib in the setting of hypokalemia. Previously on Eliquis but no longer. Cardiology note on 7/26 discussed discontinuation of Eliquis, stating "with recent afib guidelines changing women with CHADS2Vasc score of 2 to a IIB recommendation,and no clear recurrence, reasonable to d/c eliquis at this time".  -Clarify whether patient needs diltiazem 30 mg as needed -Continuous telemetry x24 hours -Monitor potassium with daily BMP -Continue daily aspirin 81mg    Resistant hypertension  primary hyperaldosteronism Bps 125-147/82 Followed at [facility].  At home takes Aldactone 100 mg daily.  Reports previously resistant hypertension was treated with up to 4 medications, however has been well controlled s/p hyperaldosteronism diagnosis with spironolactone as the only agent. -Hold spironolactone 100 mg daily d/t kidney injury   Psoriatic arthritis New diagnosis, about 2 months ago.  Was taking methotrexate prior to previous admission. There have been reports of methotrexate causing pancreatitis. She was advised to follow up with her rheumatologist. -Clarify with patient whether she is taking methotrexate -Pain relief as needed as above   Hypothyroid (Hashimoto's) At home takes levothyroxine 135 mcg daily. -Continue levothyroxine as  above  Asthma No respiratory distress Home meds: Singular 10 mg daily -Monitor respiratory status -Continue Singulair 10 mg daily  Prediabetes Last A1c 5.7%. Diet controlled  -follow up with PCP   Depression  insomnia: chronic At home takes citalopram 20 mg daily.  Also reports chronic insomnia.  Takes trazodone 150mg   sometimes at bedtime. -Continue citalopram 20mg  daily and trazodone 150mg  daily.   Tobacco use Per cardiology note 12/08/2019, has been smoking about a half pack a day for 48 years. Total pack-years = 24. added  -[facility]can offer nicotine patch   FEN/GI: clear liquids, advance to low fat as tolerated, mIVF with LR  Prophylaxis: Lovenox   Disposition: Med-Surg   History of Present Illness:  Stephanie Ewing is a 64 y.o. female presenting with about 2 days of worsening abdominal and back pain. Presents with upper abdominal pain and back pain since yesterday and worsened throughout the day.Taking oxycodone as an outpatient. Feels like bus has rammed into back. Back radiates towards neck. Nausea and 1 episode of emesis-took zofran 2 days ago. Doing well after recent Ewing d/c for 2 weeks.  She states she was eating mainly broths then started eating holiday foods such as "sausage balls"  2 days ago which triggered abdominal pains. She thinks the spice in there may have triggered the pain.  She reports methotrexate had been stopped by rheumatologist. Stills smokes. Has not been seen by GI outpatient yet.   Endorses new headache. Denies dysuira, hematuria or frequency.  Has received covid vaccines X 2 and flu vaccines.  Review Of Systems: Per HPI with the following additions   Review of Systems  Constitutional: Negative for chills and fever.  Gastrointestinal: Positive for abdominal pain, constipation, nausea and vomiting. Negative for diarrhea.  Genitourinary: Negative for dysuria.  Neurological: Positive for headaches.     Patient Active Problem List   Diagnosis  Date Noted  . Acute pancreatitis 05/06/2020  . Family history of pancreatic cancer 04/15/2020  . Hospice care patient 04/15/2020  . Palliative care patient 04/15/2020  . Pancreatitis 04/13/2020  . Hypertension     Past Medical History: Past Medical History:  Diagnosis Date  . Afib ([place])   . Asthma   . Depression   . Hyperaldosteronism ([place])   . Hypertension   . Hypokalemia   . Insomnia   . Migraine   . Nicotine dependence, cigarettes, uncomplicated   . Thyroid disease    hypothyroid    Past Surgical History: Past Surgical History:  Procedure Laterality Date  . TOTAL ABDOMINAL HYSTERECTOMY  2015    Social History: Social History   Tobacco Use  . Smoking status: Current Every Day Smoker    Packs/day: 0.50    Types: Cigarettes    Start date: 03/12/1971  . Smokeless tobacco: Never Used  . Tobacco comment: 1 pack daily  Substance Use Topics  . Alcohol use: Not Currently  . Drug use: Never   Additional social history: Please also refer to relevant sections of EMR.  Family History: Family History  Problem Relation Age of Onset  . Hypertension Mother   . Thyroid disease Mother        hypothyroidism  . Thyroid cancer Cousin   . Asthma Father   . Heart disease Father        from scarlet fever; died age 74 MVA  . Hypertension Sister   . Hypothyroidism Sister   . Hypertension Maternal Grandmother   . Heart failure Maternal Grandmother   . Diabetes Paternal  Grandmother   . Heart failure Paternal Grandmother     Allergies and Medications: Allergies  Allergen Reactions  . Codeine Itching   No current facility-administered medications on file prior to encounter.   Current Outpatient Medications on File Prior to Encounter  Medication Sig Dispense Refill  . acetaminophen (TYLENOL) 325 MG tablet Take 2 tablets (650 mg total) by mouth every 6 (six) hours. (Patient taking differently: Take 650 mg by mouth every 6 (six) hours as needed for mild pain.)    .  aspirin EC 81 MG tablet Take 81 mg by mouth at bedtime.    . Cholecalciferol (VITAMIN D) 50 MCG (2000 UT) CAPS Take 2,000 Units by mouth daily.    . citalopram (CELEXA) 20 MG tablet Take 20 mg by mouth daily.    Marland Kitchen diltiazem (CARDIZEM) 30 MG tablet Take 1 tablet every 4 hours AS NEEDED for heart rate >100 as long as top blood pressure >100. (Patient taking differently: Take 30 mg by mouth as directed. Take 1 tablet (30mg )  every 4 hours AS NEEDED for heart rate >100 as long as top blood pressure >100.) 45 tablet 1  . famotidine (PEPCID) 20 MG tablet Take 20 mg by mouth daily as needed for indigestion.     . fexofenadine (ALLEGRA) 180 MG tablet Take 180 mg by mouth daily.    Marland Kitchen gabapentin (NEURONTIN) 300 MG capsule Take 300 mg by mouth 3 (three) times daily.     . Levothyroxine Sodium 125 MCG CAPS Take 125 mcg by mouth daily before breakfast.    . montelukast (SINGULAIR) 10 MG tablet Take 10 mg by mouth daily.    . ondansetron (ZOFRAN) 4 MG tablet Take 4 mg by mouth every 8 (eight) hours as needed for nausea.     Marland Kitchen oxyCODONE-acetaminophen (PERCOCET/ROXICET) 5-325 MG tablet Take 1 tablet by mouth every 4 (four) hours as needed for severe pain.    Marland Kitchen RESTASIS 0.05 % ophthalmic emulsion Place 1 drop into both eyes 2 (two) times daily.    . sodium chloride (OCEAN) 0.65 % SOLN nasal spray Place 1 spray into both nostrils as needed for congestion.    Marland Kitchen spironolactone (ALDACTONE) 100 MG tablet Take 1 tablet (100 mg total) by mouth daily. Start 100mg  daily from 04/16/20 30 tablet 0  . traZODone (DESYREL) 150 MG tablet Take 150 mg by mouth at bedtime.      Objective: BP 125/75 (BP Location: Left Arm)   Pulse 69   Temp 97.8 F (36.6 C) (Oral)   Resp 16   Ht 5\' 7"  (1.702 m)   Wt 81.6 kg   SpO2 98%   BMI 28.19 kg/m  Exam: General: alert, pleasant, up walking in room, NAD Eyes: EOM intact ENTM: MMM. No oral lesions Neck: supple. No lymphadenopaty Cardiovascular: RRR no murmurs Respiratory: CTAB.  Normal WOB Gastrointestinal: Normoactive bowel sounds. Abdomen soft, non distended, non tender to palpation  MSK: moving extremities spontaneously  Derm: no lesions. Skin warm, dry Neuro: no focal deficits  Psych: mood appropriate. Normal insight and judgement   Labs and Imaging: CBC BMET  Recent Labs  Lab 05/05/20 2303  WBC 11.7*  HGB 13.8  HCT 43.1  PLT 298   Recent Labs  Lab 05/05/20 2303  NA 136  K 4.2  CL 102  CO2 24  BUN 21  CREATININE 1.50*  GLUCOSE 113*  CALCIUM 9.7      CT ABDOMEN PELVIS W CONTRAST  Result Date: 05/06/2020 CLINICAL DATA:  Elevated  lipase, suspected pancreatitis EXAM: CT ABDOMEN AND PELVIS WITH CONTRAST TECHNIQUE: Multidetector CT imaging of the abdomen and pelvis was performed using the standard protocol following bolus administration of intravenous contrast. Sagittal and coronal MPR images reconstructed from axial data set. CONTRAST:  62mL OMNIPAQUE IOHEXOL 300 MG/ML SOLN IV. No oral contrast. COMPARISON:  04/13/2020 FINDINGS: Lower chest: Lung bases clear Hepatobiliary: Gallbladder and liver normal appearance. No biliary dilatation. Pancreas: Mild hazy peripancreatic infiltration at pancreatic head consistent with mild focal pancreatitis. Clean peripancreatic fat planes at body and tail. No mass, ductal dilatation, calcification or hemorrhage. Spleen: Normal appearance Adrenals/Urinary Tract: Adrenal glands, kidneys, ureters, and bladder normal appearance Stomach/Bowel: Normal appendix. Stomach and bowel loops normal appearance Vascular/Lymphatic: Aorta normal caliber. Vascular structures patent. No adenopathy. Reproductive: Uterus surgically absent. Nonvisualization of ovaries. Other: No free air or free fluid. No hernia or inflammatory process. Musculoskeletal: Unremarkable IMPRESSION: Mild hazy peripancreatic infiltration at pancreatic head consistent with focal pancreatitis. No complications of acute pancreatitis identified. Remainder of exam  unremarkable. Electronically Signed   By: Lavonia Dana M.D.   On: 05/06/2020 12:51    Shary Key, DO 05/06/2020, 5:54 PM PGY-1, [place] Intern pager: [phone], text pages welcome  FPTS Upper-Level Resident Addendum   I have independently interviewed and examined the patient. I have discussed the above with the original author and agree with their documentation. My edits for correction/addition/clarification are in blue. Please see also any attending notes.   Lattie Haw MD PGY-2, [place] Medicine 05/06/2020 6:21 PM  [place] Service pager: [phone] (text pages welcome through [place])

## 2020-05-06 NOTE — ED Notes (Signed)
Pt stepped outside.  

## 2020-05-06 NOTE — Consult Note (Signed)
[facility] Consult: 3:38 PM 05/06/2020  LOS: 0 days    Referring Provider: Dr. Andria Frames Primary Care Physician:  Monico Blitz, MD in [place]. Primary Gastroenterologist: unassigned    Reason for Consultation: Recurrent idiopathic pancreatitis.   HPI: Stephanie Ewing is a 64 y.o. female.  Retired Statistician.  PMH: Primary hyperaldosteronism.  Hypertension.  Paroxysmal A. Fib, not on anticoagulation.  Psoriatic arthritis.  Hashimoto's hypothyroidism on Synthroid.  CKD 3.  Depression.  Glucose intolerance. S/p gastroplasty.  S/p hysterectomy 2012  09/2016 colonoscopy by Dr. Abigail Miyamoto in [facility].  For surveillance of colon polyps noted in 2013.  No recurrent polyps. Patient does not think she is ever had an upper endoscopy. Was told by her ENT specialist years ago that she had GERD, she controls this with as needed Pepcid  04/13/2020 -04/15/2020 admission with acute pancreatitis. Abdominal ultrasound: Unremarkable, gallbladder, CBD, liver, portal vein all normal. CTAP w contrast showed pancreatic head and proximal body edema, mild peripancreatic fat stranding consistent with acute pancreatitis.  Liver, gallbladder, biliary tree, pancreatic duct unremarkable.  However lipase was normal at 46, normal LFTs. No etiology uncovered.  Methotrexate was discontinued as a possible culprit.  It was the most recently initiated medication that she had been taking for a couple of months prescribed by her rheumatologist. At discharge patient was comfortable, tolerating solid liquid PO.   Since discharge patient said she had lingering right shoulder/scapula pain on her back and mild but persistent discomfort bilaterally in her upper abdomen.  Patient even had a couple of massages to address the  shoulder pain and says that she had some tenderness in the area where she had pain.  Appetite was okay, she stuck to bland food such as rice and broth for the first 7 to 10 days but eventually added solid food.  Earlier this week she had an episode of vomiting of bilious material.  On Tuesday she ate some sausage balls, more than she had eaten several days before.  She felt fine on Wednesday morning but in the afternoon and evening of Tuesday she developed recurrent intense bilateral upper abdominal pain.  She took some oxycodone which she had left from previous prescription and that helped initially but after the second dose it did nothing.  She had previously used tramadol before the first pancreatitis admission and it did not help at all. She came to the ED last night at about 10 PM but was not placed in a room for exam until about 11 AM today.  She has received 1 L of normal saline, LR now infusing.  She is peeing quite a bit.  No nausea.  Pain improved with morphine.  Patient says that even when her pain is present and severe that abdominal exam does not exacerbate or trigger the discomfort.  Lipase 74.  LFTs normal.  Creatinine up at 1.5, with normal BUN, reduced GFR.  WBCs 11.7, these were normal during recent admission. CTAP w contrast: Mild hazy peripancreatic infiltration at the head of the pancreas consistent with focal pancreatitis,  no other complications of acute pancreatitis.    Family history notable for great grandfather dying in his late 85s or early 29s secondary to pancreatic cancer.  Social history Tired respiratory therapist.  Had lived in [place] in [place] but now lives in [place].  She rarely if ever drinks alcohol.  Its been months since she had anything to drink, pretty much considers herself a Conservator, museum/gallery.    Past Medical History:  Diagnosis Date  . Afib ([place])   . Asthma   . Depression   . Hyperaldosteronism ([place])   . Hypertension   . Hypokalemia   . Insomnia   .  Migraine   . Nicotine dependence, cigarettes, uncomplicated   . Thyroid disease    hypothyroid    Past Surgical History:  Procedure Laterality Date  . TOTAL ABDOMINAL HYSTERECTOMY  2015    Prior to Admission medications   Medication Sig Start Date End Date Taking? Authorizing Provider  acetaminophen (TYLENOL) 325 MG tablet Take 2 tablets (650 mg total) by mouth every 6 (six) hours. Patient taking differently: Take 650 mg by mouth every 6 (six) hours as needed for mild pain. 04/15/20  Yes [name], MD  aspirin EC 81 MG tablet Take 81 mg by mouth at bedtime.   Yes [provider]  Cholecalciferol (VITAMIN D) 50 MCG (2000 UT) CAPS Take 2,000 Units by mouth daily.   Yes [provider]  citalopram (CELEXA) 20 MG tablet Take 20 mg by mouth daily.   Yes [provider]  diltiazem (CARDIZEM) 30 MG tablet Take 1 tablet every 4 hours AS NEEDED for heart rate >100 as long as top blood pressure >100. Patient taking differently: Take 30 mg by mouth as directed. Take 1 tablet (30mg )  every 4 hours AS NEEDED for heart rate >100 as long as top blood pressure >100. 09/19/19  Yes Sherran Needs, NP  famotidine (PEPCID) 20 MG tablet Take 20 mg by mouth daily as needed for indigestion.  07/14/19  Yes [provider]  fexofenadine (ALLEGRA) 180 MG tablet Take 180 mg by mouth daily.   Yes [provider]  gabapentin (NEURONTIN) 300 MG capsule Take 300 mg by mouth 3 (three) times daily.    Yes [provider]  Levothyroxine Sodium 125 MCG CAPS Take 125 mcg by mouth daily before breakfast.   Yes [provider]  montelukast (SINGULAIR) 10 MG tablet Take 10 mg by mouth daily.   Yes [provider]  ondansetron (ZOFRAN) 4 MG tablet Take 4 mg by mouth every 8 (eight) hours as needed for nausea.  03/24/20  Yes [provider]  oxyCODONE-acetaminophen (PERCOCET/ROXICET) 5-325 MG tablet Take 1 tablet by mouth every 4 (four) hours as  needed for severe pain.   Yes [provider]  RESTASIS 0.05 % ophthalmic emulsion Place 1 drop into both eyes 2 (two) times daily. 07/29/19  Yes [provider]  sodium chloride (OCEAN) 0.65 % SOLN nasal spray Place 1 spray into both nostrils as needed for congestion.   Yes [provider]  spironolactone (ALDACTONE) 100 MG tablet Take 1 tablet (100 mg total) by mouth daily. Start 100mg  daily from 04/16/20 04/15/20 05/15/20 Yes Lattie Haw, MD  traZODone (DESYREL) 150 MG tablet Take 150 mg by mouth at bedtime. 12/28/17  Yes [provider]    Scheduled Meds: . aspirin EC  81 mg Oral QHS  . citalopram  20 mg Oral Daily  . cycloSPORINE  1 drop Both Eyes BID  .  enoxaparin (LOVENOX) injection  40 mg Subcutaneous Q24H  . gabapentin  300 mg Oral TID  . [START ON 05/07/2020] levothyroxine  125 mcg Oral QAC breakfast  . loratadine  10 mg Oral Daily  . montelukast  10 mg Oral Daily  . traZODone  150 mg Oral QHS   Infusions: . lactated ringers     PRN Meds: acetaminophen, famotidine, morphine injection, ondansetron, sodium chloride   Allergies as of 05/05/2020 - Review Complete 05/05/2020  Allergen Reaction Noted  . Codeine Itching 01/11/2018    Family History  Problem Relation Age of Onset  . Hypertension Mother   . Thyroid disease Mother        hypothyroidism  . Thyroid cancer Cousin   . Asthma Father   . Heart disease Father        from scarlet fever; died age 54 MVA  . Hypertension Sister   . Hypothyroidism Sister   . Hypertension Maternal Grandmother   . Heart failure Maternal Grandmother   . Diabetes Paternal Grandmother   . Heart failure Paternal Grandmother     Social History   Socioeconomic History  . Marital status: Married    Spouse name: Not on file  . Number of children: Not on file  . Years of education: Not on file  . Highest education level: Not on file  Occupational History  . Not on file  Tobacco Use  . Smoking status:  Current Every Day Smoker    Packs/day: 0.50    Types: Cigarettes    Start date: 03/12/1971  . Smokeless tobacco: Never Used  . Tobacco comment: 1 pack daily  Substance and Sexual Activity  . Alcohol use: Not Currently  . Drug use: Never  . Sexual activity: Not on file  Other Topics Concern  . Not on file  Social History Narrative  . Not on file   Social Determinants of Health   Financial Resource Strain: Not on file  Food Insecurity: Not on file  Transportation Needs: Not on file  Physical Activity: Not on file  Stress: Not on file  Social Connections: Not on file  Intimate Partner Violence: Not on file    REVIEW OF SYSTEMS: Constitutional: No weakness or fatigue ENT:  No nose bleeds Pulm: No shortness of breath or cough CV:  No palpitations, no LE edema.  GU:  No hematuria, no frequency GI: See HPI Heme: Denies excessive or unusual bleeding or bruising Transfusions: None Neuro:  No headaches, no peripheral tingling or numbness.  No syncope, no seizures Derm:  No itching, no rash or sores.  Endocrine:  No sweats or chills.  No polyuria or dysuria Immunization: Not queried. Travel:  None beyond local counties in last few months.    PHYSICAL EXAM: Vital signs in last 24 hours: Vitals:   05/06/20 1300 05/06/20 1400  BP: 125/66 125/75  Pulse: 69 69  Resp: 16 16  Temp:    SpO2: 98% 98%   Wt Readings from Last 3 Encounters:  05/05/20 81.6 kg  04/13/20 79.8 kg  12/08/19 81.1 kg    General: Pleasant, nonill appearing, slightly overweight but not obese.  Sitting on the stretcher, comfortable Head: No facial asymmetry or swelling.  No signs of head trauma. Eyes: No scleral icterus or conjunctival pallor.  EOMI Ears: No hearing deficit Nose: No congestion or discharge Mouth: Good dentition.  Mucosa is moist, pink, clear.  Tongue midline Neck: No JVD, no masses, no thyromegaly Lungs: Clear bilaterally without labored breathing Heart:  RRR.  No MRG.  S1, S2  present Abdomen: Soft without tenderness.  Active bowel sounds.  Not distended.  No HSM, masses, bruits, hernias.   Rectal: Deferred Musc/Skeltl: No joint redness, swelling or gross deformities Extremities: No CCE Neurologic: Alert.  Excellent historian.  Oriented x3.  No tremors or limb weakness Skin: No rash, no sores, no telangiectasia Nodes: No cervical adenopathy Psych: Pleasant, calm, cooperative, in good spirits.  Intake/Output from previous day: No intake/output data recorded. Intake/Output this shift: No intake/output data recorded.  LAB RESULTS: Recent Labs    05/05/20 2303  WBC 11.7*  HGB 13.8  HCT 43.1  PLT 298   BMET Lab Results  Component Value Date   NA 136 05/05/2020   NA 142 04/15/2020   NA 139 04/14/2020   K 4.2 05/05/2020   K 4.2 04/15/2020   K 3.8 04/14/2020   CL 102 05/05/2020   CL 103 04/15/2020   CL 102 04/14/2020   CO2 24 05/05/2020   CO2 27 04/15/2020   CO2 25 04/14/2020   GLUCOSE 113 (H) 05/05/2020   GLUCOSE 100 (H) 04/15/2020   GLUCOSE 92 04/14/2020   BUN 21 05/05/2020   BUN 12 04/15/2020   BUN 16 04/14/2020   CREATININE 1.50 (H) 05/05/2020   CREATININE 1.05 (H) 04/15/2020   CREATININE 1.12 (H) 04/14/2020   CALCIUM 9.7 05/05/2020   CALCIUM 9.6 04/15/2020   CALCIUM 9.6 04/14/2020   LFT Recent Labs    05/05/20 2303  PROT 6.8  ALBUMIN 3.7  AST 18  ALT 26  ALKPHOS 59  BILITOT 0.6   PT/INR No results found for: INR, PROTIME Hepatitis Panel No results for input(s): HEPBSAG, HCVAB, HEPAIGM, HEPBIGM in the last 72 hours. C-Diff No components found for: CDIFF Lipase     Component Value Date/Time   LIPASE 74 (H) 05/05/2020 2303    Drugs of Abuse  No results found for: LABOPIA, COCAINSCRNUR, LABBENZ, AMPHETMU, THCU, LABBARB   RADIOLOGY STUDIES: CT ABDOMEN PELVIS W CONTRAST  Result Date: 05/06/2020 CLINICAL DATA:  Elevated lipase, suspected pancreatitis EXAM: CT ABDOMEN AND PELVIS WITH CONTRAST TECHNIQUE: Multidetector  CT imaging of the abdomen and pelvis was performed using the standard protocol following bolus administration of intravenous contrast. Sagittal and coronal MPR images reconstructed from axial data set. CONTRAST:  71mL OMNIPAQUE IOHEXOL 300 MG/ML SOLN IV. No oral contrast. COMPARISON:  04/13/2020 FINDINGS: Lower chest: Lung bases clear Hepatobiliary: Gallbladder and liver normal appearance. No biliary dilatation. Pancreas: Mild hazy peripancreatic infiltration at pancreatic head consistent with mild focal pancreatitis. Clean peripancreatic fat planes at body and tail. No mass, ductal dilatation, calcification or hemorrhage. Spleen: Normal appearance Adrenals/Urinary Tract: Adrenal glands, kidneys, ureters, and bladder normal appearance Stomach/Bowel: Normal appendix. Stomach and bowel loops normal appearance Vascular/Lymphatic: Aorta normal caliber. Vascular structures patent. No adenopathy. Reproductive: Uterus surgically absent. Nonvisualization of ovaries. Other: No free air or free fluid. No hernia or inflammatory process. Musculoskeletal: Unremarkable IMPRESSION: Mild hazy peripancreatic infiltration at pancreatic head consistent with focal pancreatitis. No complications of acute pancreatitis identified. Remainder of exam unremarkable. Electronically Signed   By: Lavonia Dana M.D.   On: 05/06/2020 12:51     IMPRESSION:   *    Recurrent pancreatitis of unclear cause.  Clinically does not look like severe pancreatitis. Alcohol related pancreatitis ruled out as she does not drink Does have autoimmune issues including psoriatic arthritis, Hashimoto's, primary hypoaldosteronism so autoimmune pancreatitis is a possibility. No obvious signs of cholelithiasis or choledocholithiasis but ? microlithiasis?  Medication induced pancreatitis?, MTX was stopped for this reason after initial bout of pancreatitis 3 weeks ago.   Family history of pancreatic cancer in her great grandfather in his late 70s/early 1s.  *     AKI.  Patient has already received 1 L normal saline. LR now infusing at 120/hour.     PLAN:     *   IgG4 ordered.  *   EUS?      MRCP?  *   Allow full liquid diet and advance to low-fat if tolerated, she is not currently nauseated and, if tolerated, outcomes are better for pancreatitis if the patient eats.  *    Recheck bmet in the morning.    *   Up the lactated Ringer's to 200 mL hourx 10 hours then 150 mL/hour.     Azucena Freed  05/06/2020, 3:38 PM Phone [phone]

## 2020-05-06 NOTE — Progress Notes (Addendum)
I saw and examined this patient.  I discussed with the full resident team in rounds.  I agree with the documentation and management of Dr. Posey Pronto.  Stephanie Ewing is admitted with epigastric and right lower thoracic back patient.  Mild right shoulder and neck pain.  Rescently DCed with idiopathic pancreatitis.  She feels she never fully recovered.  She has had smoldering epigastric discomfort since DC.  Flaired 36 hours ago.  Returned to the ER with elevated lipase and CT consistent with focal pancreatitis of the head.   She has had some nausea.  Comfortable in bed. We will admit.  Involve GI to see if there is anything we are missing.  We can keep her on clears and advance diet as tolerated.  I am hopeful that this might be a brief admission. I will cosign the resident H&PE when available.

## 2020-05-06 NOTE — ED Notes (Signed)
Pt taken for scan.

## 2020-05-06 NOTE — ED Notes (Signed)
Patient transported to CT vis stretcher in stable condition

## 2020-05-06 NOTE — ED Notes (Signed)
Pt sitting up on stretcher talking to pharmacy tech,.

## 2020-05-06 NOTE — ED Notes (Signed)
Report called to [place], RN

## 2020-05-06 NOTE — ED Provider Notes (Signed)
[facility] EMERGENCY DEPARTMENT Provider Note   CSN: [id number] Arrival date & time: 05/05/20  2207     History Chief Complaint  Patient presents with   Abdominal Pain    Stephanie Ewing is a 64 y.o. female.  Pt presents to the ED today with abdominal and back pain.  Pt has a hx of pancreatitis and this feels similar.  Pt was admitted from 11/30-12/2 with her first episode of pancreatitis.  Cause of pancreatitis was unclear.  She was referred to GI, but she does not know what group.  She has not heard from GI for an appointment.  She has oxycodone at home which is not helping her pain.  She said she can only take 1 a day because it causes a lot of itching.        Past Medical History:  Diagnosis Date   Afib ([facility])    Asthma    Depression    Hyperaldosteronism ([facility])    Hypertension    Hypokalemia    Insomnia    Migraine    Nicotine dependence, cigarettes, uncomplicated    Thyroid disease    hypothyroid    Patient Active Problem List   Diagnosis Date Noted   Family history of pancreatic cancer 04/15/2020   Hospice care patient 04/15/2020   Palliative care patient 04/15/2020   Pancreatitis 04/13/2020   Hypertension     Past Surgical History:  Procedure Laterality Date   TOTAL ABDOMINAL HYSTERECTOMY  2015     OB History   No obstetric history on file.     Family History  Problem Relation Age of Onset   Hypertension Mother    Thyroid disease Mother        hypothyroidism   Thyroid cancer Cousin    Asthma Father    Heart disease Father        from scarlet fever; died age 32 MVA   Hypertension Sister    Hypothyroidism Sister    Hypertension Maternal Grandmother    Heart failure Maternal Grandmother    Diabetes Paternal Grandmother    Heart failure Paternal Grandmother     Social History   Tobacco Use   Smoking status: Current Every Day Smoker    Packs/day: 0.50    Types: Cigarettes    Start date:  03/12/1971   Smokeless tobacco: Never Used   Tobacco comment: 1 pack daily  Substance Use Topics   Alcohol use: Not Currently   Drug use: Never    Home Medications Prior to Admission medications   Medication Sig Start Date End Date Taking? Authorizing Provider  acetaminophen (TYLENOL) 325 MG tablet Take 2 tablets (650 mg total) by mouth every 6 (six) hours. 04/15/20  Yes Fayette Pho, MD  aspirin EC 81 MG tablet Take 81 mg by mouth at bedtime.   Yes [provider]  citalopram (CELEXA) 20 MG tablet Take 20 mg by mouth daily.   Yes [provider]  diltiazem (CARDIZEM) 30 MG tablet Take 1 tablet every 4 hours AS NEEDED for heart rate >100 as long as top blood pressure >100. 09/19/19  Yes Newman Nip, NP  fexofenadine (ALLEGRA) 180 MG tablet Take 180 mg by mouth daily.   Yes [provider]  gabapentin (NEURONTIN) 300 MG capsule Take 300 mg by mouth 3 (three) times daily.    Yes [provider]  Levothyroxine Sodium 125 MCG CAPS Take 125 mcg by mouth daily before breakfast.   Yes [provider]  montelukast (SINGULAIR) 10 MG tablet Take 10 mg by mouth daily.   Yes [provider]  ondansetron (ZOFRAN) 4 MG tablet Take 4 mg by mouth every 8 (eight) hours as needed for nausea.  03/24/20  Yes [provider]  oxyCODONE-acetaminophen (PERCOCET/ROXICET) 5-325 MG tablet Take 1 tablet by mouth every 4 (four) hours as needed for severe pain.   Yes [provider]  RESTASIS 0.05 % ophthalmic emulsion Place 1 drop into both eyes 2 (two) times daily. 07/29/19  Yes [provider]  spironolactone (ALDACTONE) 100 MG tablet Take 1 tablet (100 mg total) by mouth daily. Start 100mg  daily from 04/16/20 04/15/20 05/15/20 Yes Lattie Haw, MD  traZODone (DESYREL) 150 MG tablet Take 1 tablet by mouth at bedtime.  12/28/17  Yes [provider]  famotidine (PEPCID) 20 MG tablet Take 20 mg by mouth daily as needed for  indigestion.  07/14/19   [provider]  methotrexate (RHEUMATREX) 10 MG tablet Take 10 mg by mouth every Friday.  03/12/20   [provider]    Allergies    Codeine  Review of Systems   Review of Systems  Gastrointestinal: Positive for abdominal pain.  All other systems reviewed and are negative.   Physical Exam Updated Vital Signs BP 125/66 (BP Location: Left Arm)    Pulse 69    Temp 97.8 F (36.6 C) (Oral)    Resp 16    Ht 5\' 7"  (1.702 m)    Wt 81.6 kg    SpO2 98%    BMI 28.19 kg/m   Physical Exam Vitals and nursing note reviewed.  Constitutional:      Appearance: She is well-developed.  HENT:     Head: Normocephalic and atraumatic.     Mouth/Throat:     Mouth: Mucous membranes are dry.  Cardiovascular:     Rate and Rhythm: Normal rate and regular rhythm.  Abdominal:     General: Abdomen is flat. Bowel sounds are normal.     Palpations: Abdomen is soft.     Tenderness: There is abdominal tenderness in the epigastric area and left upper quadrant.  Skin:    General: Skin is warm.     Capillary Refill: Capillary refill takes less than 2 seconds.  Neurological:     General: No focal deficit present.     Mental Status: She is alert and oriented to person, place, and time.  Psychiatric:        Mood and Affect: Mood normal.        Behavior: Behavior normal.     ED Results / Procedures / Treatments   Labs (all labs ordered are listed, but only abnormal results are displayed) Labs Reviewed  LIPASE, BLOOD - Abnormal; Notable for the following components:      Result Value   Lipase 74 (*)    All other components within normal limits  COMPREHENSIVE METABOLIC PANEL - Abnormal; Notable for the following components:   Glucose, Bld 113 (*)    Creatinine, Ser 1.50 (*)    GFR, Estimated 38 (*)    All other components within normal limits  CBC - Abnormal; Notable for the following components:   WBC 11.7 (*)    All other components within normal limits   URINALYSIS, ROUTINE W REFLEX MICROSCOPIC - Abnormal; Notable for the following components:   Color, Urine STRAW (*)    Hgb urine dipstick SMALL (*)    Leukocytes,Ua TRACE (*)    Bacteria, UA  RARE (*)    All other components within normal limits  RESP PANEL BY RT-PCR (FLU A&B, COVID) [id number]    EKG None  Radiology CT ABDOMEN PELVIS W CONTRAST  Result Date: 05/06/2020 CLINICAL DATA:  Elevated lipase, suspected pancreatitis EXAM: CT ABDOMEN AND PELVIS WITH CONTRAST TECHNIQUE: Multidetector CT imaging of the abdomen and pelvis was performed using the standard protocol following bolus administration of intravenous contrast. Sagittal and coronal MPR images reconstructed from axial data set. CONTRAST:  36mL OMNIPAQUE IOHEXOL 300 MG/ML SOLN IV. No oral contrast. COMPARISON:  04/13/2020 FINDINGS: Lower chest: Lung bases clear Hepatobiliary: Gallbladder and liver normal appearance. No biliary dilatation. Pancreas: Mild hazy peripancreatic infiltration at pancreatic head consistent with mild focal pancreatitis. Clean peripancreatic fat planes at body and tail. No mass, ductal dilatation, calcification or hemorrhage. Spleen: Normal appearance Adrenals/Urinary Tract: Adrenal glands, kidneys, ureters, and bladder normal appearance Stomach/Bowel: Normal appendix. Stomach and bowel loops normal appearance Vascular/Lymphatic: Aorta normal caliber. Vascular structures patent. No adenopathy. Reproductive: Uterus surgically absent. Nonvisualization of ovaries. Other: No free air or free fluid. No hernia or inflammatory process. Musculoskeletal: Unremarkable IMPRESSION: Mild hazy peripancreatic infiltration at pancreatic head consistent with focal pancreatitis. No complications of acute pancreatitis identified. Remainder of exam unremarkable. Electronically Signed   By: Lavonia Dana M.D.   On: 05/06/2020 12:51    Procedures Procedures (including critical care time)  Medications Ordered in ED Medications  sodium  chloride 0.9 % bolus 1,000 mL (0 mLs Intravenous Stopped 05/06/20 1138)  morphine 4 MG/ML injection 4 mg (4 mg Intravenous Given 05/06/20 1047)  ondansetron (ZOFRAN) injection 4 mg (4 mg Intravenous Given 05/06/20 1047)  iohexol (OMNIPAQUE) 300 MG/ML solution 80 mL (80 mLs Intravenous Contrast Given 05/06/20 1244)  0.9 %  sodium chloride infusion ( Intravenous New Bag/Given 05/06/20 1309)    ED Course  I have reviewed the triage vital signs and the nursing notes.  Pertinent labs & imaging results that were available during my care of the patient were reviewed by me and considered in my medical decision making (see chart for details).    MDM Rules/Calculators/A&P                          Pt given IVFs and pain control.  Pain has improved, but is not gone.  Pt said her oral pain meds were not helping, so she's failed outpatient treatment.  Pt d/w FP residents who d/c her on 12/2.  They will see her for admission.  Final Clinical Impression(s) / ED Diagnoses Final diagnoses:  Acute pancreatitis, unspecified complication status, unspecified pancreatitis type    Rx / DC Orders ED Discharge Orders    None       Isla Pence, MD 05/06/20 1315

## 2020-05-07 DIAGNOSIS — F17210 Nicotine dependence, cigarettes, uncomplicated: Secondary | ICD-10-CM | POA: Diagnosis present

## 2020-05-07 DIAGNOSIS — K8500 Idiopathic acute pancreatitis without necrosis or infection: Secondary | ICD-10-CM | POA: Diagnosis not present

## 2020-05-07 DIAGNOSIS — Z8349 Family history of other endocrine, nutritional and metabolic diseases: Secondary | ICD-10-CM | POA: Diagnosis not present

## 2020-05-07 DIAGNOSIS — F32A Depression, unspecified: Secondary | ICD-10-CM | POA: Diagnosis present

## 2020-05-07 DIAGNOSIS — R042 Hemoptysis: Secondary | ICD-10-CM | POA: Diagnosis present

## 2020-05-07 DIAGNOSIS — F5104 Psychophysiologic insomnia: Secondary | ICD-10-CM | POA: Diagnosis present

## 2020-05-07 DIAGNOSIS — I480 Paroxysmal atrial fibrillation: Secondary | ICD-10-CM | POA: Diagnosis present

## 2020-05-07 DIAGNOSIS — E269 Hyperaldosteronism, unspecified: Secondary | ICD-10-CM | POA: Diagnosis present

## 2020-05-07 DIAGNOSIS — J45909 Unspecified asthma, uncomplicated: Secondary | ICD-10-CM | POA: Diagnosis present

## 2020-05-07 DIAGNOSIS — Z20822 Contact with and (suspected) exposure to covid-19: Secondary | ICD-10-CM | POA: Diagnosis present

## 2020-05-07 DIAGNOSIS — Z79890 Hormone replacement therapy (postmenopausal): Secondary | ICD-10-CM | POA: Diagnosis not present

## 2020-05-07 DIAGNOSIS — Z90710 Acquired absence of both cervix and uterus: Secondary | ICD-10-CM | POA: Diagnosis not present

## 2020-05-07 DIAGNOSIS — Z885 Allergy status to narcotic agent status: Secondary | ICD-10-CM | POA: Diagnosis not present

## 2020-05-07 DIAGNOSIS — E876 Hypokalemia: Secondary | ICD-10-CM | POA: Diagnosis present

## 2020-05-07 DIAGNOSIS — E063 Autoimmune thyroiditis: Secondary | ICD-10-CM | POA: Diagnosis present

## 2020-05-07 DIAGNOSIS — L4050 Arthropathic psoriasis, unspecified: Secondary | ICD-10-CM | POA: Diagnosis present

## 2020-05-07 DIAGNOSIS — N179 Acute kidney failure, unspecified: Secondary | ICD-10-CM | POA: Diagnosis present

## 2020-05-07 DIAGNOSIS — N1831 Chronic kidney disease, stage 3a: Secondary | ICD-10-CM | POA: Diagnosis present

## 2020-05-07 DIAGNOSIS — R7303 Prediabetes: Secondary | ICD-10-CM | POA: Diagnosis present

## 2020-05-07 DIAGNOSIS — K859 Acute pancreatitis without necrosis or infection, unspecified: Secondary | ICD-10-CM | POA: Diagnosis present

## 2020-05-07 DIAGNOSIS — Z800 Family history of malignant neoplasm of digestive organs: Secondary | ICD-10-CM | POA: Diagnosis not present

## 2020-05-07 DIAGNOSIS — Z8249 Family history of ischemic heart disease and other diseases of the circulatory system: Secondary | ICD-10-CM | POA: Diagnosis not present

## 2020-05-07 DIAGNOSIS — I129 Hypertensive chronic kidney disease with stage 1 through stage 4 chronic kidney disease, or unspecified chronic kidney disease: Secondary | ICD-10-CM | POA: Diagnosis present

## 2020-05-07 DIAGNOSIS — E2609 Other primary hyperaldosteronism: Secondary | ICD-10-CM | POA: Diagnosis present

## 2020-05-07 DIAGNOSIS — Z808 Family history of malignant neoplasm of other organs or systems: Secondary | ICD-10-CM | POA: Diagnosis not present

## 2020-05-07 LAB — BASIC METABOLIC PANEL
Anion gap: 7 (ref 5–15)
BUN: 11 mg/dL (ref 8–23)
CO2: 27 mmol/L (ref 22–32)
Calcium: 9.4 mg/dL (ref 8.9–10.3)
Chloride: 106 mmol/L (ref 98–111)
Creatinine, Ser: 1.12 mg/dL — ABNORMAL HIGH (ref 0.44–1.00)
GFR, Estimated: 55 mL/min — ABNORMAL LOW (ref >60)
Glucose, Bld: 102 mg/dL — ABNORMAL HIGH (ref 70–99)
Potassium: 4.4 mmol/L (ref 3.5–5.1)
Sodium: 140 mmol/L (ref 135–145)

## 2020-05-07 MED ORDER — DIPHENHYDRAMINE HCL 25 MG PO CAPS
25.0000 mg | ORAL_CAPSULE | ORAL | Status: DC | PRN
  Administered 2020-05-07 – 2020-05-10 (×6): 25 mg via ORAL
  Filled 2020-05-07 (×7): qty 1

## 2020-05-07 MED ORDER — ACETAMINOPHEN 500 MG PO TABS
1000.0000 mg | ORAL_TABLET | Freq: Four times a day (QID) | ORAL | Status: DC
  Administered 2020-05-07 – 2020-05-10 (×9): 1000 mg via ORAL
  Filled 2020-05-07 (×10): qty 2

## 2020-05-07 MED ORDER — FENTANYL CITRATE (PF) 100 MCG/2ML IJ SOLN
25.0000 ug | Freq: Three times a day (TID) | INTRAMUSCULAR | Status: DC
  Administered 2020-05-07 – 2020-05-08 (×2): 25 ug via INTRAVENOUS
  Filled 2020-05-07 (×2): qty 2

## 2020-05-07 MED ORDER — ACETAMINOPHEN 10 MG/ML IV SOLN
1000.0000 mg | Freq: Four times a day (QID) | INTRAVENOUS | Status: DC
  Filled 2020-05-07: qty 100

## 2020-05-07 MED ORDER — DIPHENHYDRAMINE HCL 25 MG PO CAPS
25.0000 mg | ORAL_CAPSULE | Freq: Once | ORAL | Status: AC
  Administered 2020-05-07: 09:00:00 25 mg via ORAL
  Filled 2020-05-07: qty 1

## 2020-05-07 MED ORDER — TRAMADOL HCL 50 MG PO TABS
50.0000 mg | ORAL_TABLET | Freq: Four times a day (QID) | ORAL | Status: DC | PRN
  Filled 2020-05-07: qty 2

## 2020-05-07 MED ORDER — DIPHENHYDRAMINE HCL 25 MG PO CAPS
25.0000 mg | ORAL_CAPSULE | ORAL | Status: DC | PRN
  Administered 2020-05-07: 15:00:00 25 mg via ORAL
  Filled 2020-05-07 (×2): qty 1

## 2020-05-07 MED ORDER — OXYCODONE HCL 5 MG PO TABS
10.0000 mg | ORAL_TABLET | ORAL | Status: DC | PRN
Start: 2020-05-07 — End: 2020-05-07
  Administered 2020-05-07: 10 mg via ORAL
  Filled 2020-05-07: qty 2

## 2020-05-07 MED ORDER — MORPHINE SULFATE 15 MG PO TABS
15.0000 mg | ORAL_TABLET | ORAL | Status: DC | PRN
  Administered 2020-05-07: 15 mg via ORAL
  Filled 2020-05-07: qty 1

## 2020-05-07 NOTE — Progress Notes (Signed)
PT Cancellation Note  Patient Details Name: Stephanie Ewing MRN: [id number] DOB: 11/01/1955   Cancelled Treatment:    Reason Eval/Treat Not Completed: PT screened, no needs identified, will sign off per OT, patient at an independent level for mobility/at baseline and not in need of skilled PT services. Signing off at this time. Thank you for the referral!    Ann Lions PT, DPT, [place]    Pager [phone] Acute Rehab Office [phone]

## 2020-05-07 NOTE — Progress Notes (Signed)
Family Medicine Teaching Service Daily Progress Note Intern Pager: [phone]  Patient name: Stephanie Ewing Medical record number: [id number] Date of birth: [birth year] Age: 64 y.o. Gender: female  Primary Care Provider: Monico Blitz, MD Consultants: GI Code Status: Limited:yes shocks, yes medications,no compressions, yes intubation (given good prognosis and limited expected tube time, <2wks) Preferred Emergency Contact: Layza Summa (spouse): [phone]  Assessment and Plan: Stephanie Ewing is a 64 y.o. female presenting with a second episode of acute pancreatitis. PMH is significant for acute pancreatitis on 11/30, hypertension, paroxysmal A. fib, primary hyperaldosteronism,hypothyroidism, asthma, depression, migraine, insomnia, tobacco use.  Acute pancreatitis  Patient doing well this morning.  Has morphine on as needed, has used 3 times since admission, last 0746 this morning.  Patient reporting MI relief with morphine, also itching reaction as is her usual with opioids.  Will try IV Tylenol for couple doses see if this helps with the pain without giving her a pruritic reaction. -GI following, appreciate recommendations and continued care of this patient -Clear liquids, advance to low-fat diet as tolerated -Tylenol PO 650 mg q 6 hrs - Morphine IV  2mg  Q4PRN -LR mIVF 127ml/hr  - Vitals per floor routine -Follow-up IgG4 lab from GI  Acute on chronic kidney disease, Stage 3a Cr elevated on admission at 1.5.  Downtrending with IV fluids to 1.18 and now 1.12 this morning.  Baseline per care everywhere between 0.9-1.1.  Estimated GFR currently 55. - mIVF as above  -Hold spironolactone while on mIVF  History of paroxysmal A. Fib Last episode of A.fib in 2018. Home meds: aspirin 81 mg, Diltiazem 30 mg as needed.  Last seen by cardiology 12/08/2019,sees cardiologyonce per year. Reports episodes of A. fib in the setting of hypokalemia.Previously on Eliquis but no longer.  Cardiology note on 7/26 discussed discontinuation of Eliquis, stating "with recent afib guidelines changing women with CHADS2Vasc score of 2 to a IIB recommendation,and no clear recurrence,reasonable to d/c eliquis at this time". -Clarify whether patient needs diltiazem 30 mg as needed -Continuous telemetry x24 hours -Monitor potassium with daily BMP -Continue daily aspirin 81mg    Resistant hypertensionprimary hyperaldosteronism BPs last 24 hours 120-147/65-88, with pulses 64-80.  Last 120/79 with pulse 64. [facility]. At home takes Aldactone 100 mg daily.Reports previously resistant hypertension was treated with up to 4 medications, however has been well controlled s/p hyperaldosteronism diagnosis with spironolactone as the only agent. -Hold spironolactone 100 mg daily d/t kidney injury  as above  Psoriatic arthritis New diagnosis, about 2 months ago.Was taking methotrexate prior to previous admission. There have been reports of methotrexate causing pancreatitis. She was advised to follow up with her rheumatologist. -Pain relief as needed as above   Hypothyroid(Hashimoto's) At home takes levothyroxine 135 mcg daily. -Continue levothyroxine as above  Asthma No respiratory distress Home meds: Singular 10 mg daily -Monitor respiratory status -Continue Singulair 10 mg daily  Prediabetes Last A1c 5.7%.Diet controlled  -follow up with PCP   Depressioninsomnia: chronic At home takescitalopram 20 mg daily.Also reports chronic insomnia.Takes trazodone 150mg   sometimes at bedtime. -Continue citalopram 20mg  daily and trazodone 150mg  daily.   Tobacco use Per cardiology note 12/08/2019,has beensmoking about ahalf pack a day for 48 years. Total pack-years = 24.added  -Ccan offer nicotine patch   FEN/GI: clear liquids, advance to low fat as tolerated, mIVF with LR  Prophylaxis: Lovenox    Status is: Observation  The patient remains OBS appropriate  and will d/c before 2 midnights.  Dispo: The patient is  from: Home              Anticipated d/c is to: Home              Anticipated d/c date is: 1 day              Patient currently is not medically stable to d/c.     Subjective:  Patient at sink this morning upon entering room, I just washed her hair.  Ambulating well without difficulties.  She reports doing okay overnight, says that the pain is moderately relieved by morphine, but it makes her itch all over.  She has a lifelong history of pruritic reactions to opioids.  Requested Benadryl earlier this morning.  She also reports having a mild fever at home right before admission, measured at 100.1>100.3>100.5 with her home thermometer.  However, has not had any fevers here.  Also reports vomiting prior to admission, however no vomiting here.  Objective: Temp:  [97.8 F (36.6 C)-98.5 F (36.9 C)] 97.9 F (36.6 C) (12/24 0606) Pulse Rate:  [64-80] 64 (12/24 0606) Resp:  [16-18] 18 (12/24 0606) BP: (120-147)/(65-88) 120/79 (12/24 0606) SpO2:  [97 %-100 %] 98 % (12/24 0606)  Physical Exam: General: Awake, alert, oriented, no acute distress Cardiovascular: Regular rate and rhythm, no murmurs auscultated Respiratory: Clear to auscultation bilaterally Abdomen: Soft, nondistended, bowel sounds present, severe TTP in epigastric region, moderate TTP in left quadrants, especially LUQ, no rebound tenderness, no TTP in right quadrants Extremities: Moving all spontaneously, no weakness noted  Laboratory: Recent Labs  Lab 05/05/20 2303 05/06/20 1854  WBC 11.7* 7.1  HGB 13.8 12.7  HCT 43.1 40.0  PLT 298 246   Recent Labs  Lab 05/05/20 2303 05/06/20 1854 05/07/20 0356  NA 136 138 140  K 4.2 3.8 4.4  CL 102 102 106  CO2 24 28 27   BUN 21 13 11   CREATININE 1.50* 1.18* 1.12*  CALCIUM 9.7 9.4 9.4  PROT 6.8  --   --   BILITOT 0.6  --   --   ALKPHOS 59  --   --   ALT 26  --   --   AST 18  --   --   GLUCOSE 113* 131* 102*     Imaging/Diagnostic Tests: CT ABDOMEN AND PELVIS WITH CONTRAST 05/06/2020 IMPRESSION: Mild hazy peripancreatic infiltration at pancreatic head consistent with focal pancreatitis. No complications of acute pancreatitis identified. Remainder of exam unremarkable.   [name], MD 05/07/2020, 8:13 AM PGY-1, [place] Intern pager: [phone], text pages welcome

## 2020-05-07 NOTE — Progress Notes (Signed)
RN went into the patients room and she stated that she would like some benadryl because she is starting to itch. States that she has never taken morphine before and thinks that is what is causing it. Page FM and and got a call back from the MD and she stated she will put in for benadryl and will come and see the patient. Pt nothaving any SOB or hives or scratchy throat.

## 2020-05-07 NOTE — Progress Notes (Signed)
Progress Note   Subjective  Patient is doing a bit better. Able to tolerate full liquids. Still having some abdominal discomfort. Had some itching with both morphine and oral oxycodone relieved with benadryl.    Objective   Vital signs in last 24 hours: Temp:  [97.9 F (36.6 C)-98.5 F (36.9 C)] 98.5 F (36.9 C) (12/24 1211) Pulse Rate:  [64-70] 68 (12/24 1211) Resp:  [16-18] 16 (12/24 1211) BP: (104-147)/(62-82) 104/62 (12/24 1211) SpO2:  [97 %-100 %] 97 % (12/24 1211)   General:    white female in NAD Abdomen:  Soft, mild abdominal ttp, and nondistended.  Extremities:  Without edema. Neurologic:  Alert and oriented,  grossly normal neurologically. Psych:  Cooperative. Normal mood and affect.  Intake/Output from previous day: 12/23 0701 - 12/24 0700 In: 2728.3 [P.O.:240; I.V.:2488.3] Out: -  Intake/Output this shift: No intake/output data recorded.  Lab Results: Recent Labs    05/05/20 2303 05/06/20 1854  WBC 11.7* 7.1  HGB 13.8 12.7  HCT 43.1 40.0  PLT 298 246   BMET Recent Labs    05/05/20 2303 05/06/20 1854 05/07/20 0356  NA 136 138 140  K 4.2 3.8 4.4  CL 102 102 106  CO2 24 28 27   GLUCOSE 113* 131* 102*  BUN 21 13 11   CREATININE 1.50* 1.18* 1.12*  CALCIUM 9.7 9.4 9.4   LFT Recent Labs    05/05/20 2303  PROT 6.8  ALBUMIN 3.7  AST 18  ALT 26  ALKPHOS 59  BILITOT 0.6   PT/INR No results for input(s): LABPROT, INR in the last 72 hours.  Studies/Results: CT ABDOMEN PELVIS W CONTRAST  Result Date: 05/06/2020 CLINICAL DATA:  Elevated lipase, suspected pancreatitis EXAM: CT ABDOMEN AND PELVIS WITH CONTRAST TECHNIQUE: Multidetector CT imaging of the abdomen and pelvis was performed using the standard protocol following bolus administration of intravenous contrast. Sagittal and coronal MPR images reconstructed from axial data set. CONTRAST:  53mL OMNIPAQUE IOHEXOL 300 MG/ML SOLN IV. No oral contrast. COMPARISON:  04/13/2020 FINDINGS: Lower  chest: Lung bases clear Hepatobiliary: Gallbladder and liver normal appearance. No biliary dilatation. Pancreas: Mild hazy peripancreatic infiltration at pancreatic head consistent with mild focal pancreatitis. Clean peripancreatic fat planes at body and tail. No mass, ductal dilatation, calcification or hemorrhage. Spleen: Normal appearance Adrenals/Urinary Tract: Adrenal glands, kidneys, ureters, and bladder normal appearance Stomach/Bowel: Normal appendix. Stomach and bowel loops normal appearance Vascular/Lymphatic: Aorta normal caliber. Vascular structures patent. No adenopathy. Reproductive: Uterus surgically absent. Nonvisualization of ovaries. Other: No free air or free fluid. No hernia or inflammatory process. Musculoskeletal: Unremarkable IMPRESSION: Mild hazy peripancreatic infiltration at pancreatic head consistent with focal pancreatitis. No complications of acute pancreatitis identified. Remainder of exam unremarkable. Electronically Signed   By: Lavonia Dana M.D.   On: 05/06/2020 12:51       Assessment / Plan:    64 y/o female with chronic tobacco use, history of psoriatic arthritis with previous methotrexate use, history of pancreatitis 1 month ago, readmitted with pancreatitis. Prior workup at last hospitalization showed negative RUQ [place] without gallstones. She does not drink alcohol. Lipids looked okay. She does not have any significant family history of pancreatic cancer or pancreatitis. Sounds like her first episode never really resolved, had mild symptoms and she ate country fried steak and sausage recently and symptoms got worse. Lipase mildly elevated but CT continues to show mild pancreatitis. LFTs normal. Creatinine bumped to 1.5 and now improved with fluids.  Overall, she is improved  and tolerating full liquids but still requires pain medication. We discussed other etiologies for her pancreatitis. Singulair can rarely be associated with pancreatitis and have stopped that. She also  stopped methotrexate. She will need a follow up EUS / MRCP as outpatient once she recovers from this, we can coordinate that once out of the hospital. IgG4 sent and pending, assume this will be negative. She was counseled on tobacco cessation.  Continue supportive care with IVF and pain medications, advance to low fat diet as she tolerates and should stay on that once discharged. We will coordinate outpatient follow up. We will sign off for now, call with questions in the interim.      Waikane Cellar, MD [facility]

## 2020-05-07 NOTE — Evaluation (Signed)
Occupational Therapy Evaluation Patient Details Name: Stephanie Ewing MRN: [id number] DOB: 1955-11-04 Today's Date: 05/07/2020    History of Present Illness 64 yo female presenting to ED with abdominal and back pain. Recent admission from 11/30-12/2 for pancreatitis; referred to GI. CT abdomen pelvis: mild hazy peripancreatic infiltration at pancreatic head consistent with focal pancreatitis. PMH including asthma, depression, HTN, hypothyroid, HTN, and afib.    Clinical Impression   PTA, pt was living with her husband and sister and was independent. Pt currently performing ADLs and functional mobility at Mod I - Independent level. Pt continues to report back and stomach pain. Performing near baseline function. Providing education on compensatory techniques for ADLs and IADLs to reduce pain and optimize function; pt verbalized understanding. Recommend dc to home once medically stable per physician. All acute OT needs met and will sign off. Thank you.     Follow Up Recommendations  No OT follow up    Equipment Recommendations  None recommended by OT    Recommendations for Other Services PT consult     Precautions / Restrictions Precautions Precautions: None      Mobility Bed Mobility Overal bed mobility: Modified Independent             General bed mobility comments: Increased time    Transfers Overall transfer level: Independent                    Balance Overall balance assessment: No apparent balance deficits (not formally assessed)                                         ADL either performed or assessed with clinical judgement   ADL Overall ADL's : Modified independent                                       General ADL Comments: Pt performing ADLs and functional mobility at Mod I -  Independent level. Increased time for pain and fatigue. Pt reporting "I dont feel 100% but I am doing better". Providing pt with education  on compensatory techniques for ADLs and IADLs including figure four method, using a second cup during oral care, and elevating one leg in prolonged standing. Pt verbalized understanding. Also provided back handout for carry over to home     Vision Baseline Vision/History: Wears glasses Wears Glasses: At all times Patient Visual Report: No change from baseline       Perception     Praxis      Pertinent Vitals/Pain Pain Assessment: Faces Faces Pain Scale: Hurts little more Pain Location: Stomach Pain Descriptors / Indicators: Constant;Discomfort;Grimacing Pain Intervention(s): Monitored during session;Repositioned;Patient requesting pain meds-RN notified     Hand Dominance     Extremity/Trunk Assessment Upper Extremity Assessment Upper Extremity Assessment: Overall WFL for tasks assessed       Cervical / Trunk Assessment Cervical / Trunk Assessment: Other exceptions Cervical / Trunk Exceptions: Stomach pain   Communication Communication Communication: No difficulties   Cognition Arousal/Alertness: Awake/alert Behavior During Therapy: WFL for tasks assessed/performed Overall Cognitive Status: Within Functional Limits for tasks assessed  General Comments  VSS    Exercises     Shoulder Instructions      Home Living Family/patient expects to be discharged to:: Private residence Living Arrangements: Spouse/significant other;Other relatives (Husband and younger sister) Available Help at Discharge: Family;Available PRN/intermittently;Available 24 hours/day Type of Home: House Home Access: Level entry     Home Layout: Multi-level;Able to live on main level with bedroom/bathroom     Bathroom Shower/Tub: Occupational psychologist: Handicapped height     Home Equipment: None          Prior Functioning/Environment Level of Independence: Independent        Comments: Pt performing ADLs, IADLs, and  driving. Retired Statistician.        OT Problem List: Decreased range of motion;Pain;Decreased knowledge of use of DME or AE      OT Treatment/Interventions:      OT Goals(Current goals can be found in the care plan section) Acute Rehab OT Goals Patient Stated Goal: Return home OT Goal Formulation: All assessment and education complete, DC therapy  OT Frequency:     Barriers to D/C:            Co-evaluation              AM-PAC OT "6 Clicks" Daily Activity     Outcome Measure Help from another person eating meals?: None Help from another person taking care of personal grooming?: None Help from another person toileting, which includes using toliet, bedpan, or urinal?: None Help from another person bathing (including washing, rinsing, drying)?: None Help from another person to put on and taking off regular upper body clothing?: None Help from another person to put on and taking off regular lower body clothing?: None 6 Click Score: 24   End of Session Nurse Communication: Mobility status;Patient requests pain meds  Activity Tolerance: Patient tolerated treatment well Patient left: in bed;with call bell/phone within reach  OT Visit Diagnosis: Unsteadiness on feet (R26.81);Other abnormalities of gait and mobility (R26.89);Muscle weakness (generalized) (M62.81)                Time: 0711-0726 OT Time Calculation (min): 15 min Charges:  OT General Charges $OT Visit: 1 Visit OT Evaluation $OT Eval Low Complexity: [place], OTR/L Acute Rehab Pager: [phone] Office: [place] 05/07/2020, 7:41 AM

## 2020-05-08 LAB — BASIC METABOLIC PANEL
Anion gap: 9 (ref 5–15)
BUN: 11 mg/dL (ref 8–23)
CO2: 30 mmol/L (ref 22–32)
Calcium: 9.7 mg/dL (ref 8.9–10.3)
Chloride: 103 mmol/L (ref 98–111)
Creatinine, Ser: 1.24 mg/dL — ABNORMAL HIGH (ref 0.44–1.00)
GFR, Estimated: 48 mL/min — ABNORMAL LOW (ref >60)
Glucose, Bld: 102 mg/dL — ABNORMAL HIGH (ref 70–99)
Potassium: 4.4 mmol/L (ref 3.5–5.1)
Sodium: 142 mmol/L (ref 135–145)

## 2020-05-08 MED ORDER — FENTANYL CITRATE (PF) 100 MCG/2ML IJ SOLN
25.0000 ug | Freq: Four times a day (QID) | INTRAMUSCULAR | Status: DC
Start: 2020-05-08 — End: 2020-05-09
  Administered 2020-05-08 – 2020-05-09 (×4): 25 ug via INTRAVENOUS
  Filled 2020-05-08 (×4): qty 2

## 2020-05-08 MED ORDER — OXYCODONE HCL 5 MG PO TABS
2.5000 mg | ORAL_TABLET | Freq: Four times a day (QID) | ORAL | Status: DC | PRN
  Administered 2020-05-08 (×2): 2.5 mg via ORAL
  Filled 2020-05-08 (×2): qty 1

## 2020-05-08 NOTE — Progress Notes (Signed)
Family Medicine Teaching Service Daily Progress Note Intern Pager: [phone]  Patient name: Stephanie Ewing Medical record number: [id number] Date of birth: Apr 16, 1956 Age: 64 y.o. Gender: female  Primary Care Provider: Monico Blitz, MD Consultants: GI Code Status: Limited:yes shocks, yes medications,no compressions, yes intubation (given good prognosis and limited expected tube time, <2wks)  Pt Overview and Major Events to Date:  12/23: admitted  12/24: GI signed off  Assessment and Plan: Stephanie Wann. Lunsfordis a 84 y.o.femalepresenting with a second episode of acute pancreatitis. PMH is significant for acute pancreatitis on 11/30,hypertension, paroxysmal A. fib, primary hyperaldosteronism,hypothyroidism, asthma, depression, migraine, insomnia, tobacco use.  Acute pancreatitis Pain still not well controlled. Patient had a bad day yesterday and is upset that she was not given enough pain meds. -GI following, and off yesterday.  Recommended supportive care with IV fluids and pain medications and to advance diet with low-fat diet.  They will follow-up as an outpatient. -Clear liquids, advance to low-fat diet as tolerated -Monitor with pruritus with narcotic use  -Benadryl 25mg  Q3PRN   -Added Oxycodone 2.5 mg every 6 as needed for breakthrough pain -Tylenol 1000mg  Q6H  - Fentanyl 25 mcg Q3PRN for severe pain -LR mIVF 13ml/hr  -Follow-up IgG4 lab from GI  Acute on chronic kidney disease, Stage 3a Cr 1.24, 1.12 yesterday -Avoid nephrotoxic agents  -Monitor with BMP  -Continue mIVFas above -Hold spironolactone while on mIVF  History of paroxysmal A. Fib HR 61 Last episode of A.fib in 2018.Home meds:aspirin 81 mg, Diltiazem 30 mg as needed for HR>100  Last seen by cardiology 12/08/2019,sees cardiologyonce per year. Reports episodes of A. fib in the setting of hypokalemia.Previously on Eliquis but no longer. Cardiology note on 7/26 discussed discontinuation of Eliquis,  stating "with recent afib guidelines changing women with CHADS2Vasc score of 2 to a IIB recommendation,and no clear recurrence,reasonable to d/c eliquis at this time". -Continue daily aspirin81mg   Resistant hypertensionprimary hyperaldosteronism BP 110/65. Followed at [facility].  Home meds: Aldactone 100 mg daily.Reports previously resistant hypertension was treated with up to 4 medications, however has been well controlled s/p hyperaldosteronism diagnosis with spironolactone as the only agent. -Holdspironolactone 100 mg dailyd/t kidney injury as above  Psoriatic arthritis New diagnosis,about2 months ago.Was taking methotrexate prior to previous admission. There have been reports of methotrexate causing pancreatitis. She was advised to follow up with her rheumatologist. -Pain relief as neededas above  Hypothyroid(Hashimoto's) Home meds: Levothyroxine 135 mcg daily. -Continue levothyroxine as above  Asthma No respiratory distress, sats 98% on air  Home meds: Singular10 mg daily -Monitor respiratory status -Hold Singulair due to risk of pancreatitis   Prediabetes Last A1c 5.7%.Diet controlled -Follow up with PCP  Depressioninsomnia: chronic Home meds: Citalopram 20 mg daily, Trazodone150mg at bedtime -Continue citalopram 20mg  daily -Continue trazodone 150mg  daily.  Tobacco use Per cardiology note 12/08/2019,has beensmoking about ahalf pack a day for 48 years. Total pack-years = 24.added  -Can offer nicotine patch  FEN/GI:clear liquids, advance to low fat as tolerated, mIVF with LR Prophylaxis:Lovenox  Status is: Inpatient  Remains inpatient appropriate because:Ongoing active pain requiring inpatient pain management and Inpatient level of care appropriate due to severity of illness   Dispo: The patient is from: Home              Anticipated d/c is to: Home              Anticipated d/c date is: 2 days              Patient  currently is  not medically stable to d/c.   Subjective:  Patient reports that her pain is not well controlled today. She is upset that her pain meds are not strong enough. She rather be "itching" than be in pain.i said I am happy to add on 2.5mg  oxycodone Q6PRN for breakthrough pain.   Objective: Temp:  [98.4 F (36.9 C)-98.7 F (37.1 C)] 98.4 F (36.9 C) (12/25 0532) Pulse Rate:  [68-74] 68 (12/25 0532) Resp:  [16-18] 18 (12/25 0532) BP: (96-110)/(52-97) 110/65 (12/25 0532) SpO2:  [97 %-98 %] 98 % (12/25 0532)  Physical Exam:  General: Alert, no acute distress Cardio: Normal S1 and S2, RRR, no r/m/g Pulm: CTAB, normal work of breathing Abdomen: Bowel sounds normal. Abdomen soft, non distended, mild tenderness in epigastrium, no guarding.  Extremities: No peripheral edema.  Neuro: Cranial nerves grossly intact   Laboratory: Recent Labs  Lab 05/05/20 2303 05/06/20 1854  WBC 11.7* 7.1  HGB 13.8 12.7  HCT 43.1 40.0  PLT 298 246   Recent Labs  Lab 05/05/20 2303 05/06/20 1854 05/07/20 0356 05/08/20 0233  NA 136 138 140 142  K 4.2 3.8 4.4 4.4  CL 102 102 106 103  CO2 24 28 27 30   BUN 21 13 11 11   CREATININE 1.50* 1.18* 1.12* 1.24*  CALCIUM 9.7 9.4 9.4 9.7  PROT 6.8  --   --   --   BILITOT 0.6  --   --   --   ALKPHOS 59  --   --   --   ALT 26  --   --   --   AST 18  --   --   --   GLUCOSE 113* 131* 102* 102*     Imaging/Diagnostic Tests: No results found.  Lattie Haw, MD 05/08/2020, 8:01 AM PGY-, [place] Intern pager: [phone], text pages welcome

## 2020-05-09 ENCOUNTER — Inpatient Hospital Stay (HOSPITAL_COMMUNITY): Payer: Medicare Other

## 2020-05-09 DIAGNOSIS — K8500 Idiopathic acute pancreatitis without necrosis or infection: Secondary | ICD-10-CM

## 2020-05-09 DIAGNOSIS — R042 Hemoptysis: Secondary | ICD-10-CM

## 2020-05-09 LAB — CBC WITH DIFFERENTIAL/PLATELET
Abs Immature Granulocytes: 0.02 10*3/uL (ref 0.00–0.07)
Basophils Absolute: 0.1 10*3/uL (ref 0.0–0.1)
Basophils Relative: 1 %
Eosinophils Absolute: 0.3 10*3/uL (ref 0.0–0.5)
Eosinophils Relative: 4 %
HCT: 38.7 % (ref 36.0–46.0)
Hemoglobin: 12.8 g/dL (ref 12.0–15.0)
Immature Granulocytes: 0 %
Lymphocytes Relative: 17 %
Lymphs Abs: 1.1 10*3/uL (ref 0.7–4.0)
MCH: 30.3 pg (ref 26.0–34.0)
MCHC: 33.1 g/dL (ref 30.0–36.0)
MCV: 91.7 fL (ref 80.0–100.0)
Monocytes Absolute: 0.5 10*3/uL (ref 0.1–1.0)
Monocytes Relative: 8 %
Neutro Abs: 4.5 10*3/uL (ref 1.7–7.7)
Neutrophils Relative %: 70 %
Platelets: 248 10*3/uL (ref 150–400)
RBC: 4.22 MIL/uL (ref 3.87–5.11)
RDW: 12.9 % (ref 11.5–15.5)
WBC: 6.4 10*3/uL (ref 4.0–10.5)
nRBC: 0 % (ref 0.0–0.2)

## 2020-05-09 LAB — BASIC METABOLIC PANEL
Anion gap: 10 (ref 5–15)
BUN: 9 mg/dL (ref 8–23)
CO2: 27 mmol/L (ref 22–32)
Calcium: 9.9 mg/dL (ref 8.9–10.3)
Chloride: 101 mmol/L (ref 98–111)
Creatinine, Ser: 1.2 mg/dL — ABNORMAL HIGH (ref 0.44–1.00)
GFR, Estimated: 50 mL/min — ABNORMAL LOW (ref >60)
Glucose, Bld: 170 mg/dL — ABNORMAL HIGH (ref 70–99)
Potassium: 4 mmol/L (ref 3.5–5.1)
Sodium: 138 mmol/L (ref 135–145)

## 2020-05-09 LAB — D-DIMER, QUANTITATIVE: D-Dimer, Quant: 0.76 ug/mL-FEU — ABNORMAL HIGH (ref 0.00–0.50)

## 2020-05-09 IMAGING — CT CT ANGIO CHEST
2 of 6 series · 19 of 36 positions shown · IV contrast (omnipaque)
Comparison: Chest radiograph dated [DATE]

CLINICAL DATA: 64-year-old female with elevated D-dimer.

EXAM:
CT ANGIOGRAPHY CHEST WITH CONTRAST
TECHNIQUE: Multidetector CT imaging of the chest was performed using the
standard protocol during bolus administration of intravenous
contrast. Multiplanar CT image reconstructions and MIPs were
obtained to evaluate the vascular anatomy.
CONTRAST:  75mL OMNIPAQUE IOHEXOL 350 MG/ML SOLN

[Series 6: pe thins · axial · 0.81mm/px · z∈[+1058,+1336]mm · 18 of 443 slices shown]
[im 23/443  lung]
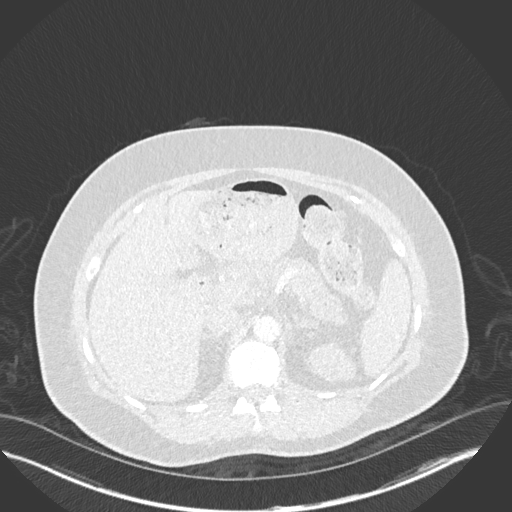
[im 45/443  mediastinal]
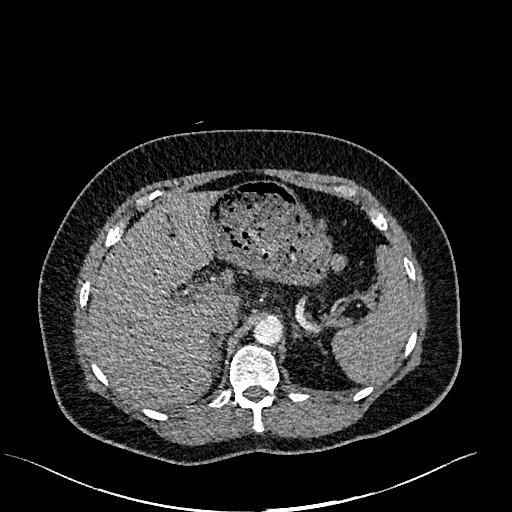
[im 67/443  lung]
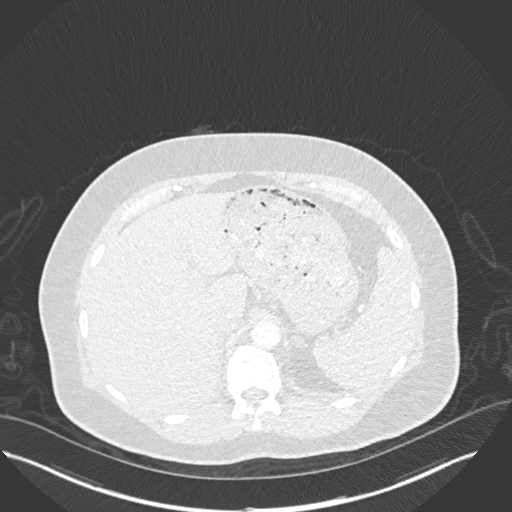
[im 89/443  mediastinal]
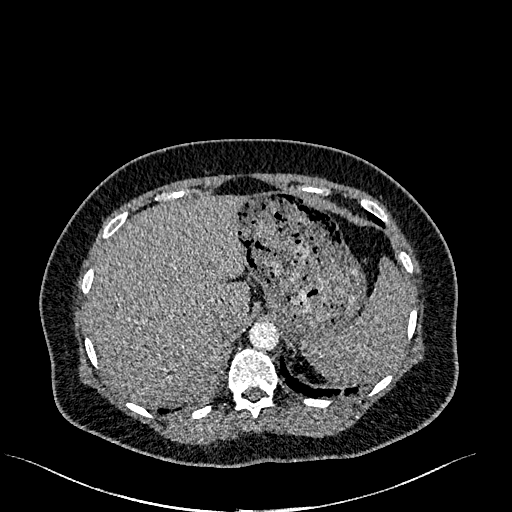
[im 111/443  lung]
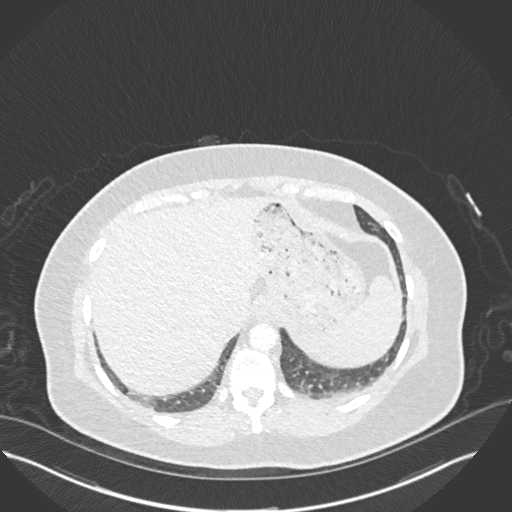
[im 133/443  mediastinal]
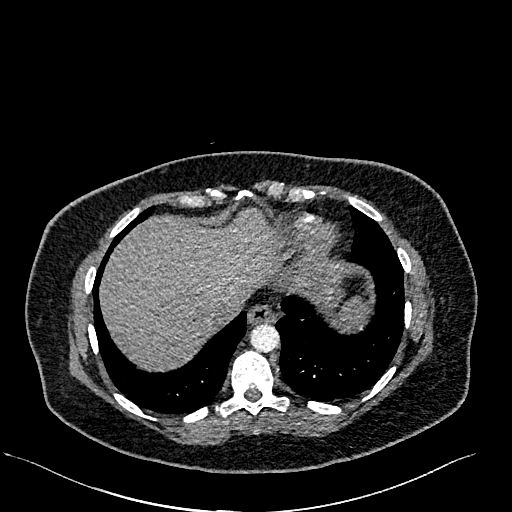
[im 155/443  lung]
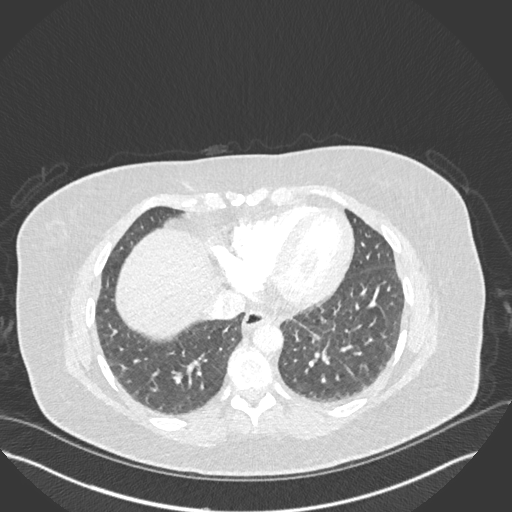
[im 177/443  mediastinal]
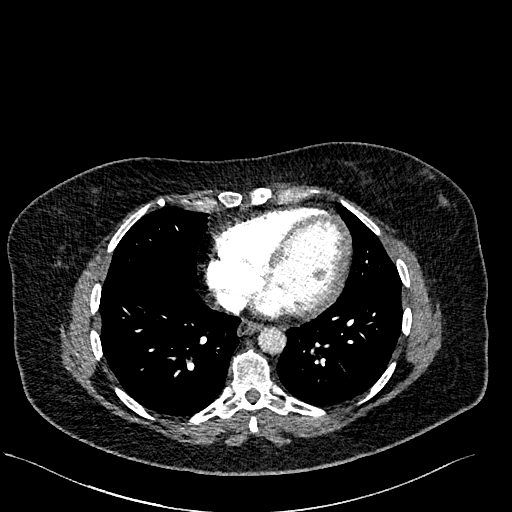
[im 199/443  lung]
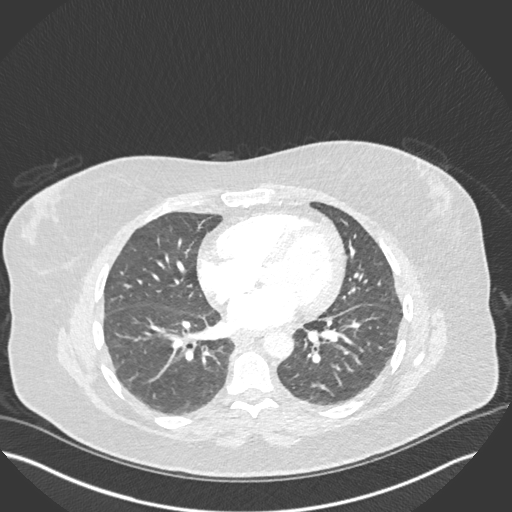
[im 244/443  mediastinal]
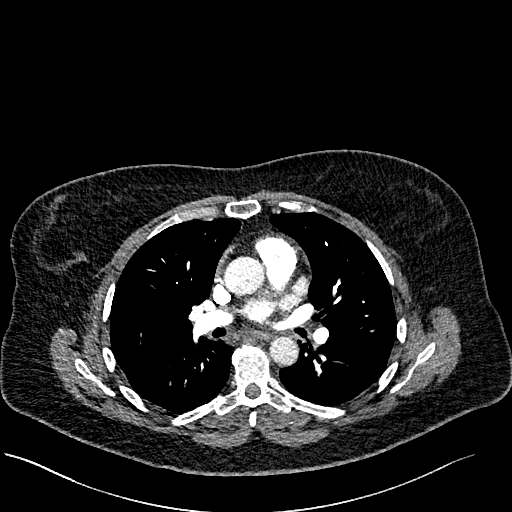
[im 266/443  lung]
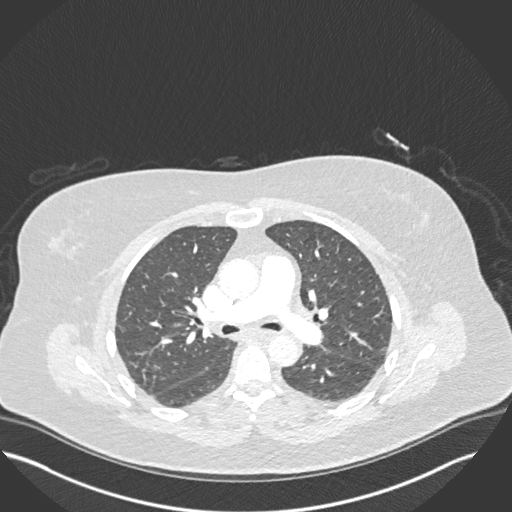
[im 288/443  mediastinal]
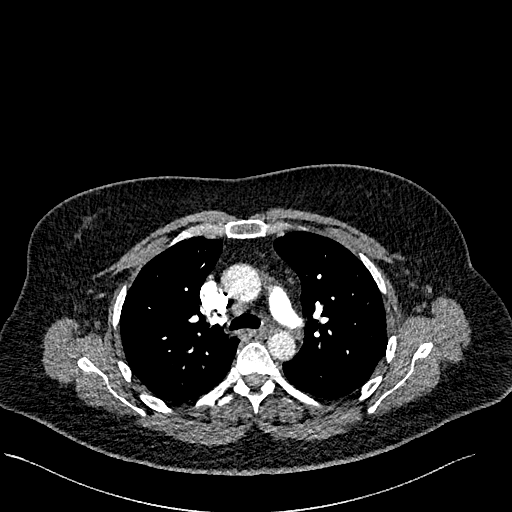
[im 310/443  lung]
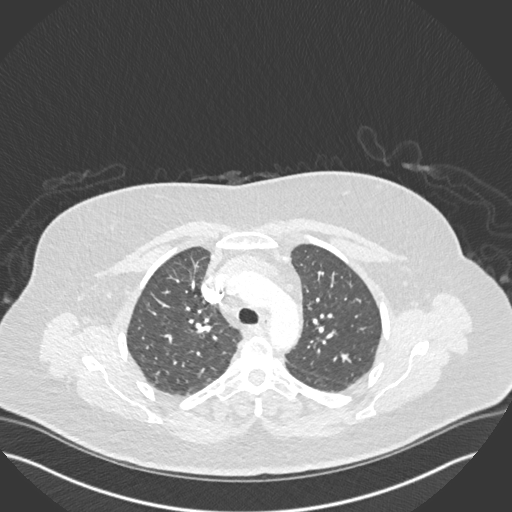
[im 332/443  mediastinal]
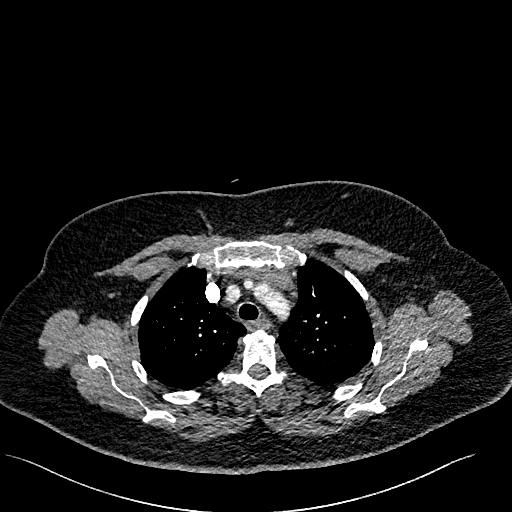
[im 354/443  lung]
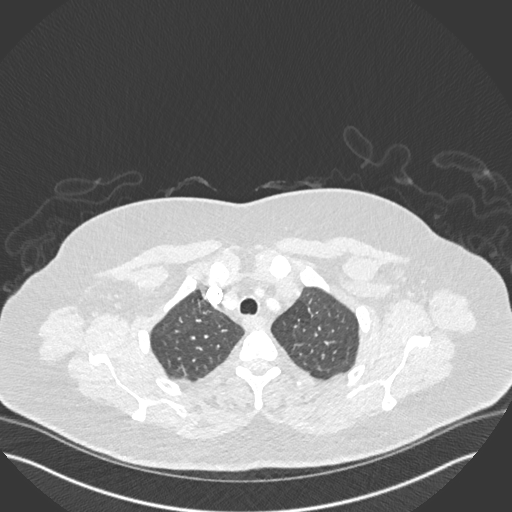
[im 376/443  mediastinal]
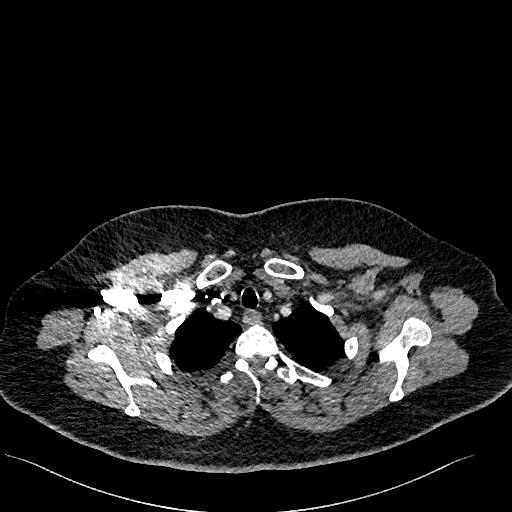
[im 398/443  lung]
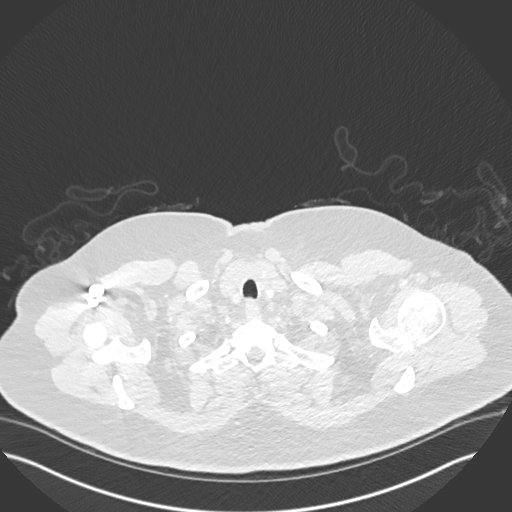
[im 420/443  mediastinal]
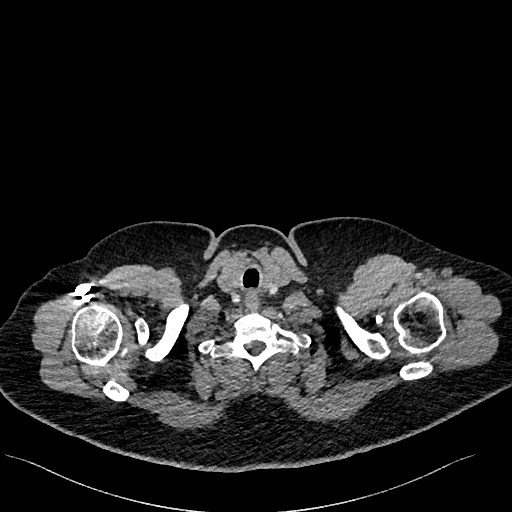

[Series 8: pe 2mm cor · coronal · 0.65mm/px · 1 of 154 slices shown]
[im 77/154  mediastinal]
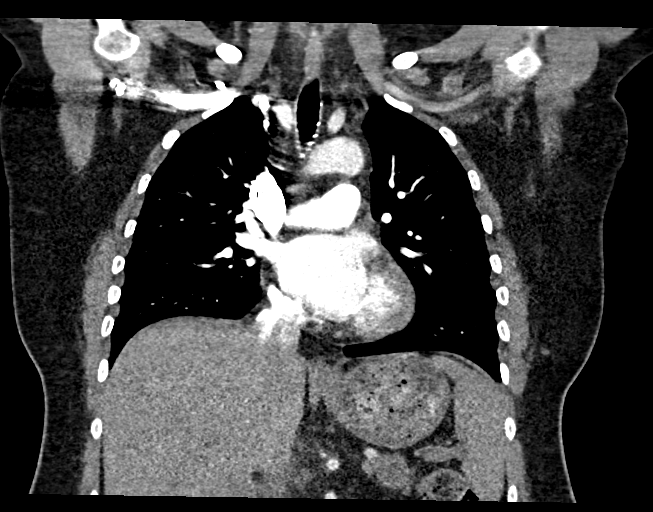

[19 of 36 positions shown; findings below may reference images not displayed]

FINDINGS: Cardiovascular: There is no cardiomegaly or pericardial effusion.
The thoracic aorta is unremarkable. No pulmonary artery embolus
identified.

Mediastinum/Nodes: There is no hilar or mediastinal adenopathy. The
esophagus and the thyroid gland are grossly unremarkable. No
mediastinal fluid collection.

Lungs/Pleura: No focal consolidation, pleural effusion,
pneumothorax. The central airways are patent.

Upper Abdomen: Apparent peripancreatic inflammatory changes
concerning for acute pancreatitis. Correlation with pancreatic
enzymes recommended.

Musculoskeletal: No acute osseous pathology.

Review of the MIP images confirms the above findings.
IMPRESSION: 1. No acute intrathoracic pathology. No CT evidence of pulmonary
artery embolus.
2. Findings concerning for acute pancreatitis. Correlation with
pancreatic enzymes recommended.

## 2020-05-09 IMAGING — CR DG CHEST 2V
2 series · 2 of 2 positions shown · non-contrast
Comparison: [DATE]

CLINICAL DATA: Hemoptysis today

EXAM:
CHEST - 2 VIEW

[chest pa]
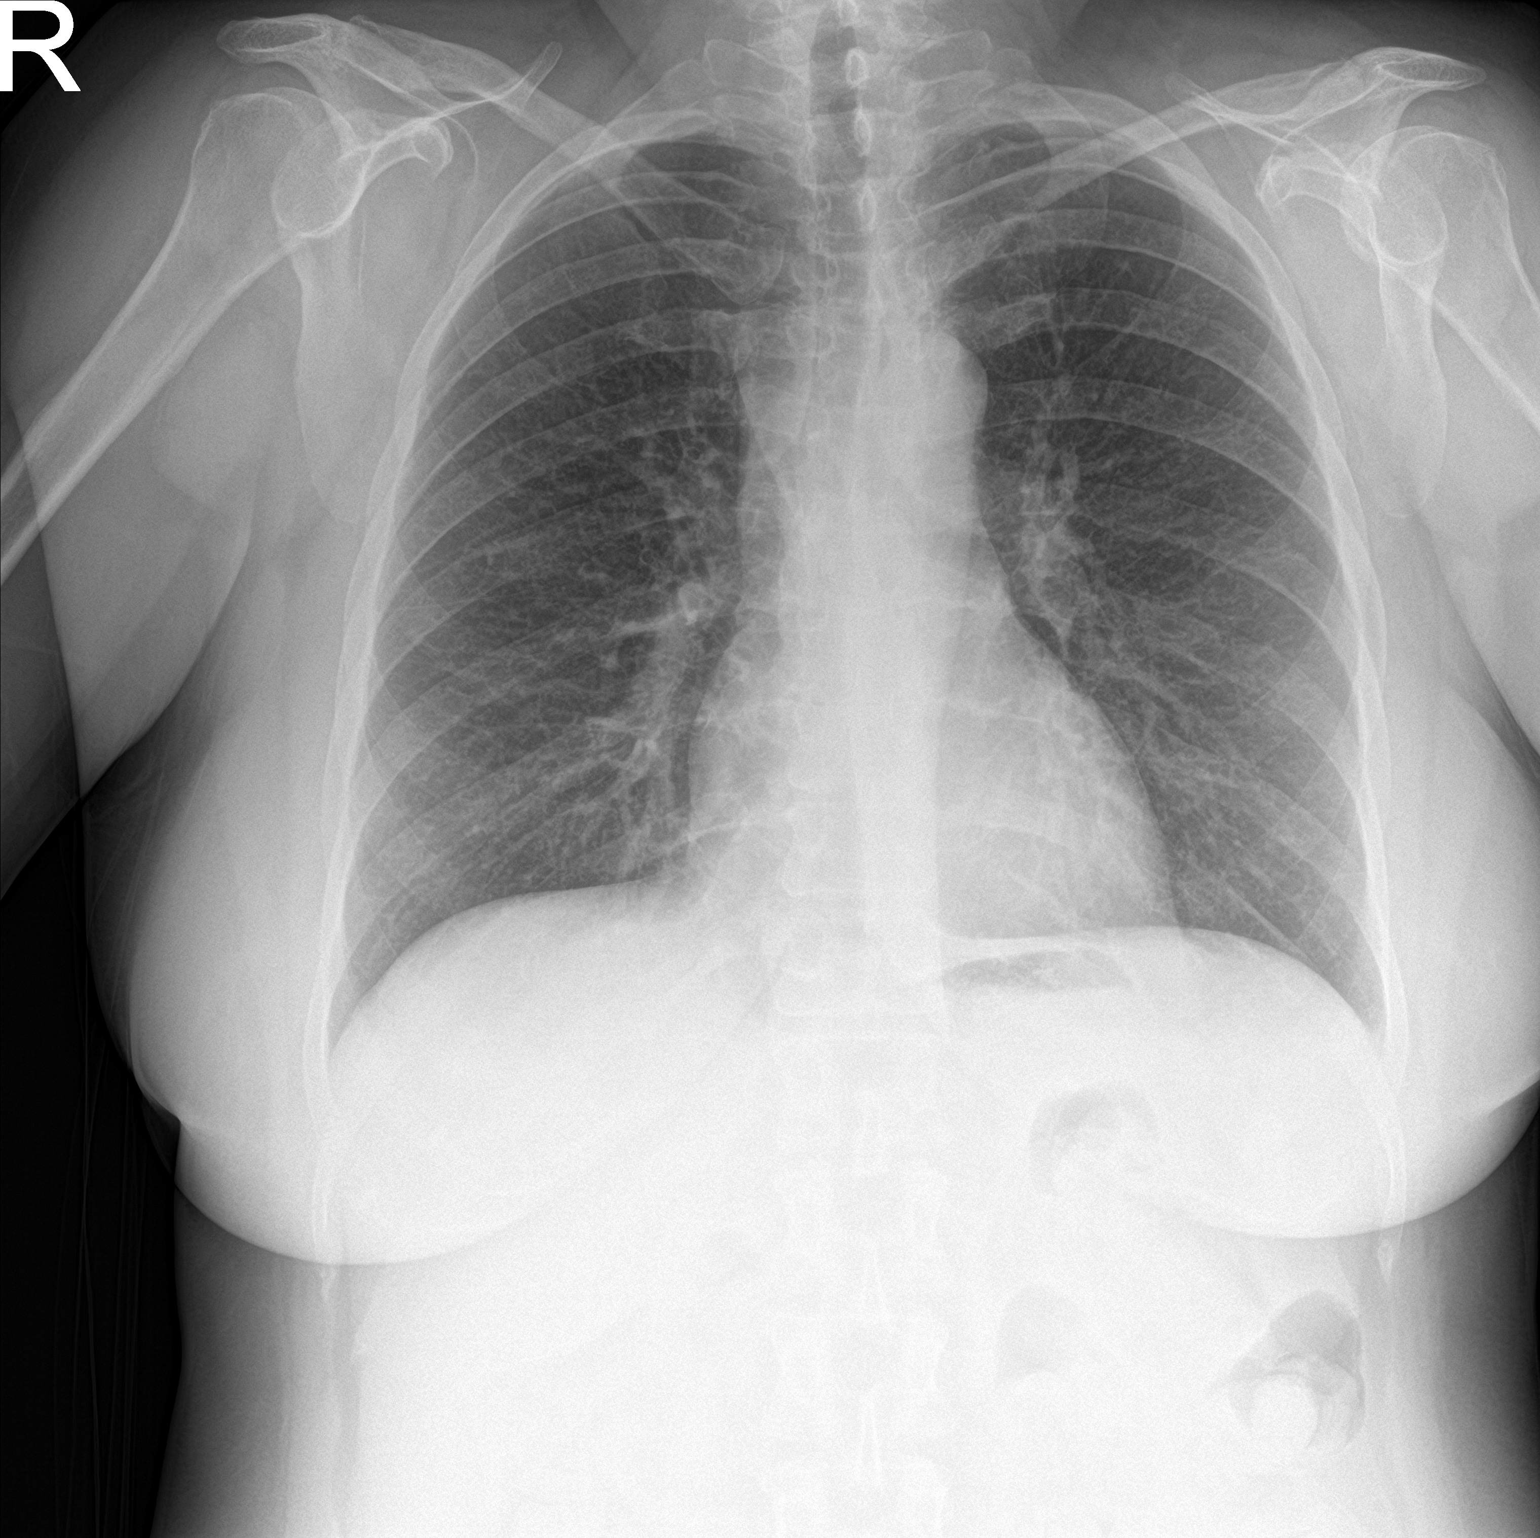

[chest lat]
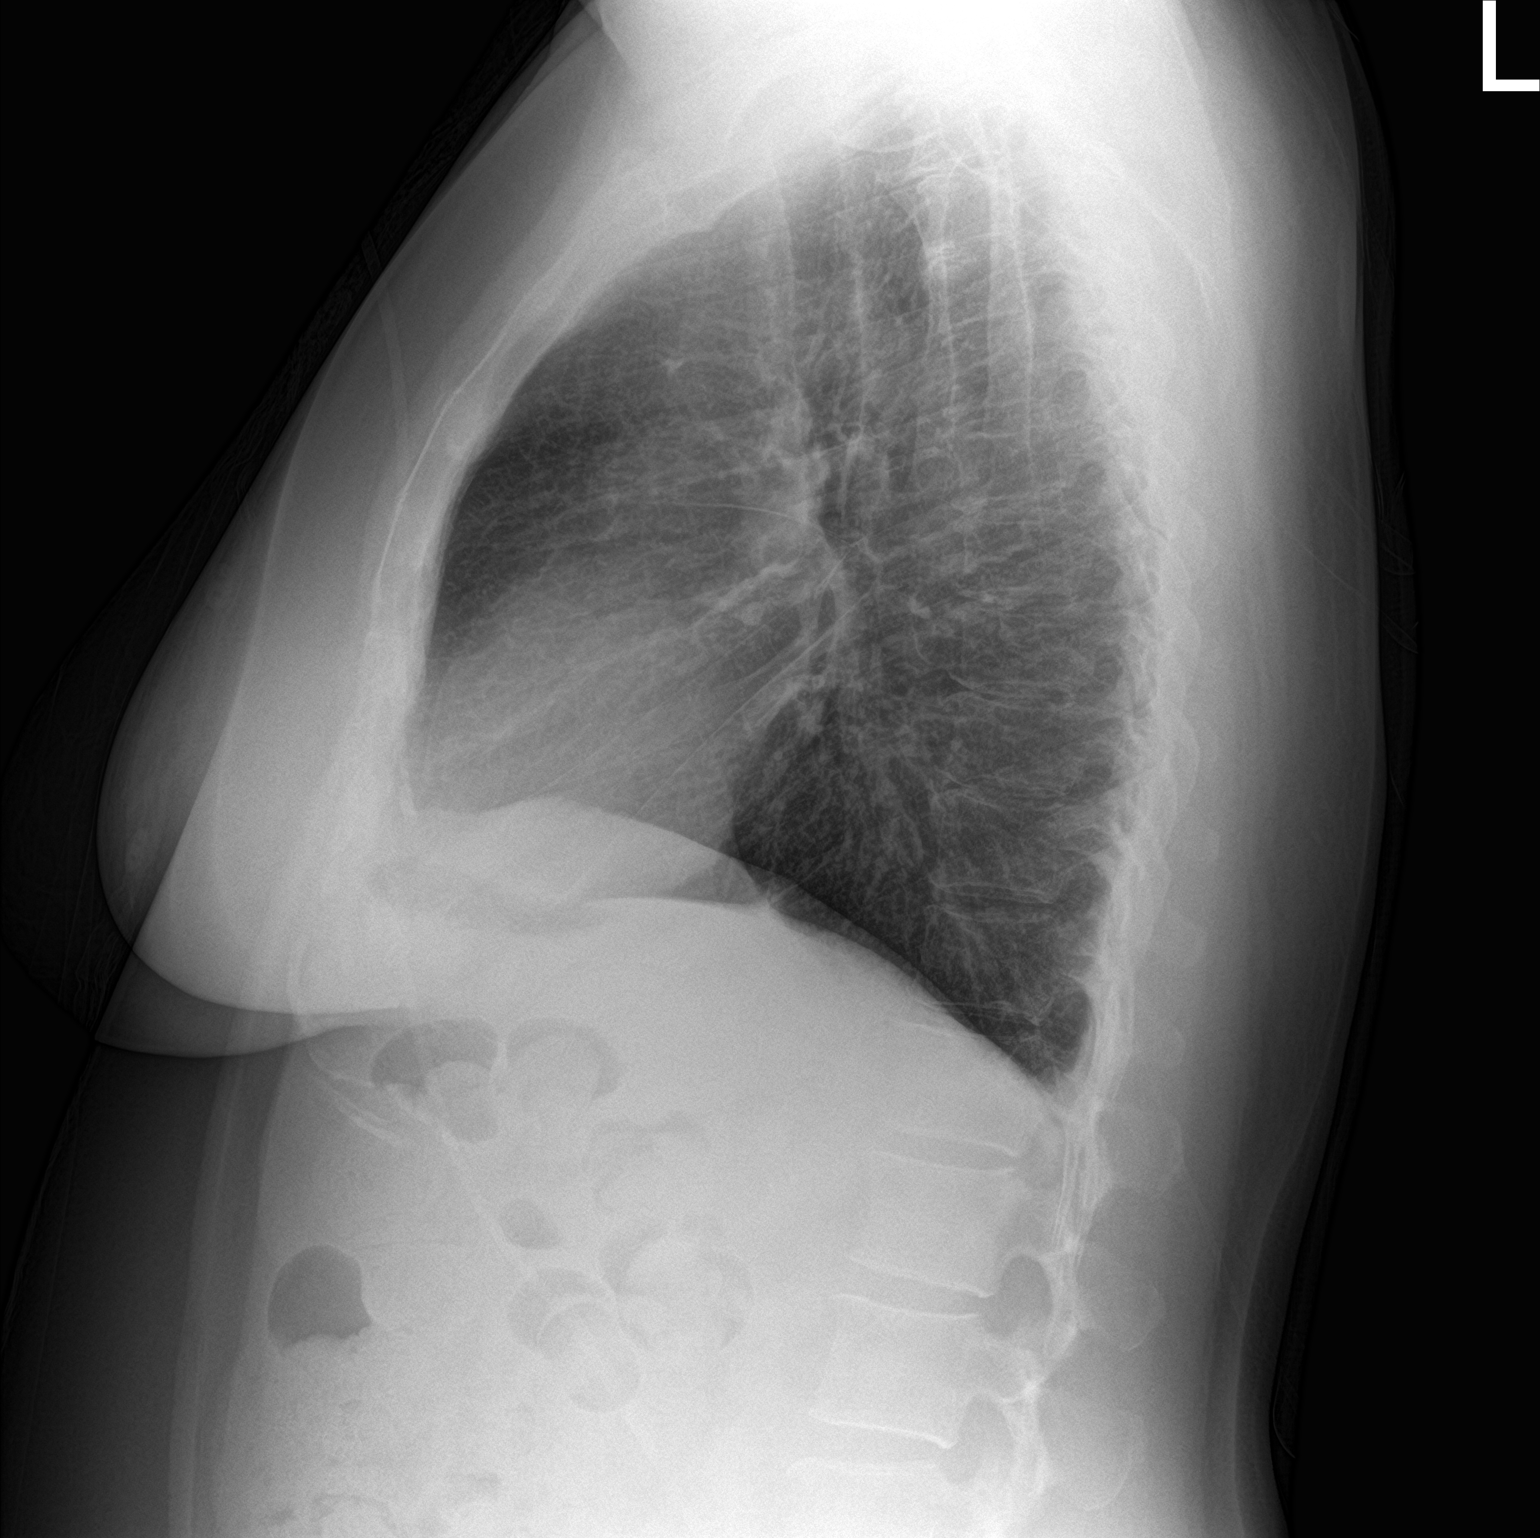

[2 of 2 positions shown; findings below may reference images not displayed]

FINDINGS: Chronic coarse interstitial markings. No focal infiltrate or overt
edema.

Heart size and mediastinal contours are within normal limits.

No effusion.  No pneumothorax.

Visualized bones unremarkable.
IMPRESSION: No acute cardiopulmonary disease.

## 2020-05-09 MED ORDER — IOHEXOL 350 MG/ML SOLN
75.0000 mL | Freq: Once | INTRAVENOUS | Status: AC | PRN
  Administered 2020-05-09: 17:00:00 75 mL via INTRAVENOUS

## 2020-05-09 MED ORDER — SODIUM CHLORIDE 0.9 % IV BOLUS
1000.0000 mL | Freq: Once | INTRAVENOUS | Status: AC
  Administered 2020-05-09: 16:00:00 1000 mL via INTRAVENOUS

## 2020-05-09 MED ORDER — OXYCODONE HCL 5 MG PO TABS
2.5000 mg | ORAL_TABLET | ORAL | Status: DC | PRN
  Administered 2020-05-09 – 2020-05-10 (×6): 5 mg via ORAL
  Filled 2020-05-09 (×7): qty 1

## 2020-05-09 NOTE — Progress Notes (Signed)
Family Medicine Teaching Service Daily Progress Note Intern Pager: [phone]  Patient name: Stephanie Ewing Medical record number: [id number] Date of birth: 1955-08-10 Age: 64 y.o. Gender: female  Primary Care Provider: Monico Blitz, MD Consultants: GI  Code Status: Partial   Pt Overview and Major Events to Date:  12/23: admitted  12/24: GI signed off  Assessment and Plan: Stephanie Ewing. Lunsfordis a 64 y.o.femalepresenting witha second episode of acute pancreatitis.PMH is significant for acute pancreatitis on 11/30,hypertension, paroxysmal A. fib, primary hyperaldosteronism,hypothyroidism, asthma, depression, migraine, insomnia, tobacco use.  Acute pancreatitis Patient reports pain is better controlled with regimen of fentanyl 7mcg and 2.5 mg of oxycodone every 6 for breakthrough. Patient reports she has been able to tolerate solid food p.o.  She does not have nausea this morning. -Patient evaluated by gastroenterology, signed off 12/24. -Pain management with PO, will increase oxycodone to 2.5-5mg  every 4 hours  - dc fentanyl  -Tylenol 1000 mg every 6 hours -discontinue IVF, push PO hydration  -Follow-up IgG4, remains in process  Acute on chronic kidney disease, stage IIIa Cr last up to 1.24 from  1.12. Today 1.20  -Avoid nephrotoxic agents  -Monitor Cr with BMP  -Discontinue IV fluids, encouraged oral hydration today -We will consider restarting spironolactone with continued improvement of creatinine  History of paroxysmal atrial fibrillation Heart rate has ranged in the 60s.  Home medications include aspirin 81 mg, diltiazem 30 mg as needed for heart rate greater than 100.  Patient was evaluated by cardiology in July 2021 and has annual follow-up.  Patient is not on anticoagulation. -Aspirin 81 mg daily -cardiac monitoring discontinued   Resistant hypertension  primary hyperaldosterone Blood pressure WNL.  Home medications include Aldactone 100 mg daily. -Holding  spironolactone 100 mg as patient has had AKI   Psoriatic arthritis Concern and methotrexate could be associated with pancreatitis. -Managing pain as listed above, holding methotrexate  Hypothyroidism due to Hashimoto's -Continue levothyroxine 135 mcg daily  Asthma  oxygen saturations 97-98% on RA  Patient takes Singulair 10 mg daily at home.  Patient comfortable on room air and asymptomatic at this time. -Monitor respiratory status and oxygen saturation with vitals  Prediabetes Hemoglobin A1c 5.7 most recently.  Glucose elevated at 170 today.  Currently on no medications.  -Recommend follow-up as outpatient with primary care physician  Depression Continue citalopram 20 mg daily  Insomnia, chronic Patient on trazodone 150 mg at bedtime -Continue trazodone nightly  Tobacco use Patient reports smoking ~12 cigarettes per day.  - recommend smoking cessation  - will recommend for outpatient follow up for cessation methods/assistance    FEN/GI: Clear liquids advance to low-fat diet as tolerated PPx: Lovenox  Status is: Inpatient  Remains inpatient appropriate because:Ongoing active pain requiring inpatient pain management   Dispo: The patient is from: Home              Anticipated d/c is to: Home              Anticipated d/c date is: 1 day              Patient currently is not medically stable to d/c.        Subjective:  Patient reports that her pain is much improved rating it is a 4-5 this morning.  She reports that she was able to tolerate her first solid meal which consisted of oatmeal for breakfast.  She feels that she is much improved while using the fentanyl and oxycodone.  Patient states that  she is appreciative of the frequency of her medication. This morning patient complains of new development of hemoptysis.  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  She denies any fevers or chills or chest  pain/tightness and she is not short of breath.  Objective: Temp:  [97.8 F (36.6 C)-98.4 F (36.9 C)] 97.8 F (36.6 C) (12/26 [id number]) Pulse Rate:  [60-61] 60 (12/26 0632) Resp:  [18] 18 (12/26 [id number]) BP: (122-148)/(57-66) 122/64 (12/26 [id number]) SpO2:  [97 %-98 %] 97 % (12/26 [id number])  Physical Exam: General: Female appearing stated age sitting up in bed in no acute distress, pleasantly conversational Cardiovascular: Regular rate and rhythm Respiratory: Clear to auscultation without wheezing or crackles, no retractions noted, patient is stable on room air Abdomen: Tenderness mostly epigastric region Extremities: No lower extremity swelling  Laboratory: Recent Labs  Lab 05/05/20 2303 05/06/20 1854 05/09/20 0933  WBC 11.7* 7.1 6.4  HGB 13.8 12.7 12.8  HCT 43.1 40.0 38.7  PLT 298 246 248   Recent Labs  Lab 05/05/20 2303 05/06/20 1854 05/07/20 0356 05/08/20 0233 05/09/20 0933  NA 136   < > 140 142 138  K 4.2   < > 4.4 4.4 4.0  CL 102   < > 106 103 101  CO2 24   < > 27 30 27   BUN 21   < > 11 11 9   CREATININE 1.50*   < > 1.12* 1.24* 1.20*  CALCIUM 9.7   < > 9.4 9.7 9.9  PROT 6.8  --   --   --   --   BILITOT 0.6  --   --   --   --   ALKPHOS 59  --   --   --   --   ALT 26  --   --   --   --   AST 18  --   --   --   --   GLUCOSE 113*   < > 102* 102* 170*   < > = values in this interval not displayed.     Imaging/Diagnostic Tests: No new imaging   Eulis Foster, MD 05/09/2020, 11:58 AM PGY-2, [place] Intern pager: [phone], text pages welcome

## 2020-05-09 NOTE — Progress Notes (Signed)
Patient underwent chest x-ray with no abnormal findings.  Patient and sister remain concerned about hemoptysis in after discussion and agreed to check D-dimer.  If D-dimer elevated would then proceed with chest CT imaging with contrast to evaluate for pulmonary embolism as well as any potential malignancy.  D-dimer was collected and resulted at 0.76.  Chest CT a ordered with contrast.  Patient to receive 1 L fluid bolus of normal saline prior to receiving IV contrast.

## 2020-05-10 LAB — EXPECTORATED SPUTUM ASSESSMENT W GRAM STAIN, RFLX TO RESP C

## 2020-05-10 LAB — IGG 4: IgG, Subclass 4: 9 mg/dL (ref 2–96)

## 2020-05-10 LAB — BASIC METABOLIC PANEL
Anion gap: 10 (ref 5–15)
BUN: 12 mg/dL (ref 8–23)
CO2: 27 mmol/L (ref 22–32)
Calcium: 9.7 mg/dL (ref 8.9–10.3)
Chloride: 104 mmol/L (ref 98–111)
Creatinine, Ser: 1.24 mg/dL — ABNORMAL HIGH (ref 0.44–1.00)
GFR, Estimated: 48 mL/min — ABNORMAL LOW (ref >60)
Glucose, Bld: 108 mg/dL — ABNORMAL HIGH (ref 70–99)
Potassium: 4.2 mmol/L (ref 3.5–5.1)
Sodium: 141 mmol/L (ref 135–145)

## 2020-05-10 MED ORDER — OXYCODONE HCL 5 MG PO TABS
2.5000 mg | ORAL_TABLET | ORAL | 0 refills | Status: DC | PRN
Start: 2020-05-10 — End: 2020-07-21

## 2020-05-10 MED ORDER — ONDANSETRON HCL 4 MG PO TABS: 4.0000 mg | ORAL_TABLET | Freq: Three times a day (TID) | ORAL | 0 refills | Status: DC | PRN

## 2020-05-10 NOTE — Progress Notes (Signed)
RN gave pt discharge paperwork and she stated understanding. IV has been removed and new medication escribed to home pharmacy.

## 2020-05-10 NOTE — Discharge Instructions (Signed)
You were hospitalized at [facility] due to pancreatitis. Additional tests were done to figure out the source of this, though they are still pending. We are discharging you with additional pain medication and nausea medication. It is important that you follow up with the Gastroenterologists outpatient for further work-up, their contact information is below. We have also been holding your spironolactone due to an elevation in your kidney function. Please do not take this until you have blood work done at your PCP office to ensure your kidney function is doing better. Please continue to drink plenty of fluids and stay hydrated. We are so glad you are feeling better. Please also be sure to follow-up with your primary care physician at your earliest convenience.  Thank you for allowing [place] to take care of you! For questions regarding this admission, feel free to contact our clinic ([facility]) at [phone].   Take care, [facility] family medicine team  [facility]:  [place], [place],  [address] Phone: [phone] Acute Pancreatitis  The pancreas is a gland that is located behind the stomach on the left side of the abdomen. It produces enzymes that help to digest food. The pancreas also releases the hormones glucagon and insulin, which help to regulate blood sugar. Acute pancreatitis happens when inflammation of the pancreas suddenly occurs and the pancreas becomes irritated and swollen. Most acute attacks last a few days and cause serious problems. Some people become dehydrated and develop low blood pressure. In severe cases, bleeding in the abdomen can lead to shock and can be life-threatening. The lungs, heart, and kidneys may fail. What are the causes? This condition may be caused by:  Alcohol abuse.  Drug abuse.  Gallstones or other conditions that can block the tube that drains the pancreas (pancreatic duct).  A tumor in the pancreas. Other causes  include:  Certain medicines.  Exposure to certain chemicals.  Diabetes.  An infection in the pancreas.  Damage caused by an accident (trauma).  The poison (venom) from a scorpion bite.  Abdominal surgery.  Autoimmune pancreatitis. This is when the body's disease-fighting (immune) system attacks the pancreas.  Genes that are passed from parent to child (inherited). In some cases, the cause of this condition is not known. What are the signs or symptoms? Symptoms of this condition include:  Pain in the upper abdomen that may radiate to the back. Pain may be severe.  Tenderness and swelling of the abdomen.  Nausea and vomiting.  Fever. How is this diagnosed? This condition may be diagnosed based on:  A physical exam.  Blood tests.  Imaging tests, such as X-rays, CT or MRI scans, or an ultrasound of the abdomen. How is this treated? Treatment for this condition usually requires a stay in the hospital. Treatment for this condition may include:  Pain medicine.  Fluid replacement through an IV.  Placing a tube in the stomach to remove stomach contents and to control vomiting (NG tube, or nasogastric tube).  Not eating for 3-4 days. This gives the pancreas a rest, because enzymes are not being produced that can cause further damage.  Antibiotic medicines, if your condition is caused by an infection.  Treating any underlying conditions that may be the cause.  Steroid medicines, if your condition is caused by your immune system attacking your body's own tissues (autoimmune disease).  Surgery on the pancreas or gallbladder. Follow these instructions at home: Eating and drinking   Follow instructions from your  health care provider about diet. This may involve avoiding alcohol and decreasing the amount of fat in your diet.  Eat smaller, more frequent meals. This reduces the amount of digestive fluids that the pancreas produces.  Drink enough fluid to keep your urine  pale yellow.  Do not drink alcohol if it caused your condition. General instructions  Take over-the-counter and prescription medicines only as told by your health care provider.  Do not drive or use heavy machinery while taking prescription pain medicine.  Ask your health care provider if the medicine prescribed to you can cause constipation. You may need to take steps to prevent or treat constipation, such as: ? Take an over-the-counter or prescription medicine for constipation. ? Eat foods that are high in fiber such as whole grains and beans. ? Limit foods that are high in fat and processed sugars, such as fried or sweet foods.  Do not use any products that contain nicotine or tobacco, such as cigarettes, e-cigarettes, and chewing tobacco. If you need help quitting, ask your health care provider.  Get plenty of rest.  If directed, check your blood sugar at home as told by your health care provider.  Keep all follow-up visits as told by your health care provider. This is important. Contact a health care provider if you:  Do not recover as quickly as expected.  Develop new or worsening symptoms.  Have persistent pain, weakness, or nausea.  Recover and then have another episode of pain.  Have a fever. Get help right away if:  You cannot eat or keep fluids down.  Your pain becomes severe.  Your skin or the white part of your eyes turns yellow (jaundice).  You have sudden swelling in your abdomen.  You vomit.  You feel dizzy or you faint.  Your blood sugar is high (over 300 mg/dL). Summary  Acute pancreatitis happens when inflammation of the pancreas suddenly occurs and the pancreas becomes irritated and swollen.  This condition is typically caused by alcohol abuse, drug abuse, or gallstones.  Treatment for this condition usually requires a stay in the hospital. This information is not intended to replace advice given to you by your health care provider. Make sure  you discuss any questions you have with your health care provider. Document Revised: 02/18/2018 Document Reviewed: 11/05/2017 Elsevier Patient Education  2020 [employer].

## 2020-05-10 NOTE — Discharge Summary (Addendum)
[facility] Discharge Summary  Patient name: Stephanie Ewing Medical record number: [id number] Date of birth: 1956/03/24 Age: 64 y.o. Gender: female Date of Admission: 05/05/2020  Date of Discharge: 05/10/20   Admitting Physician: Stephanie Feil, MD  Primary Care Provider: Monico Blitz, MD Consultants: GI  Indication for Hospitalization: Acute Pancreatitis  Discharge Diagnoses/Problem List:  Acute Pancreatitis AKI on CKD stage 3a Hemoptysis Paroxysmal Atrial fibrillation  Primary Hyperaldosteronism  Psoriatic arthritis Hashimoto Disease Asthma Tobacco Use   Disposition: Home  Discharge Condition: Stable  Discharge Exam:  Blood pressure 110/77, pulse 68, temperature 97.8 F (36.6 C), temperature source Oral, resp. rate 18, height 5\' 7"  (1.702 m), weight 81.6 kg, SpO2 94 %. Gen: awake, alert, NAD, pleasant Cardio: RRR, without murmur Resp: Decreased breath sounds diffusely, without wheezing/rhonchi/rales Abd: soft, tender to palpation epigastric and LUQ. No R/G.  Ext: warm and dry, 2+ radial and DP pulses    Brief Hospital Course:  Stephanie Ewing is a 64 y.o. female presenting with a second episode of acute pancreatitis. PMH is significant for acute pancreatitis on 11/30, hypertension, paroxysmal A. fib, primary hyperaldosteronism, hypothyroidism, asthma, depression, migraine, insomnia, tobacco use. Below is her brief hospital course listed by problem.   Acute Pancreatitis Patient presented with upper abdominal pain and back pain and was found to have another episode of acute pancreatitis after eating holiday foods such as "sausage balls" . Since her last discharge for pancreatitis, she has not been able to establish with GI and her rheumatologist has stopped her MTX due to concern it cold trigger her inflammation. Patient was evaluated by GI who recommended hydration and pain management as well as checking IgG4 level.  On 12/26, she was  able to tolerate PO pain management and oral feeding/hydration. D/c with Oxy IR for pain control and instructions to follow up with GI outpatient. IgG4 still pending at time of discharge.   Hemoptysis  Patient developed hemoptysis without chest pain or fever. She underwent imaging of her chest with an xray that did not show any abnormalities. Additionally had CTA that was negative for PE. Emphasized smoking cessation to help with chronic cough and decrease malignancy risk.   Smoking Cessation  Patient will need outpatient follow up for cessation methods/assistance, smoking 12 cigarettes per day.   Issue for Follow up  1. Follow up IgG 4 labs (ordered by GI) 2. Patient will need GI follow up as outpatient, may need MRCP or EUS 3. Monitor Cr with BMP at follow up due to recent IV contrast  4. Monitor pain 2/2 acute pancreatitis 5. Episodes of hemoptysis- with negative CXR and CTA. Consider outpatient pulm referral for a flex bronchoscopy if continues 6. Follow up sputum culture for hemoptysis  7. Restart spironolactone if BMP ok  8. Continue to hold Methotrexate  9. Hold Singulair due to increased risk of pancreatitis    Significant Procedures: None  Significant Labs and Imaging:  Recent Labs  Lab 05/05/20 2303 05/06/20 1854 05/09/20 0933  WBC 11.7* 7.1 6.4  HGB 13.8 12.7 12.8  HCT 43.1 40.0 38.7  PLT 298 246 248   Recent Labs  Lab 05/05/20 2303 05/06/20 1854 05/07/20 0356 05/08/20 0233 05/09/20 0933 05/10/20 0439  NA 136 138 140 142 138 141  K 4.2 3.8 4.4 4.4 4.0 4.2  CL 102 102 106 103 101 104  CO2 24 28 27 30 27 27   GLUCOSE 113* 131* 102* 102* 170* 108*  BUN 21 13 11  11  9 12  CREATININE 1.50* 1.18* 1.12* 1.24* 1.20* 1.24*  CALCIUM 9.7 9.4 9.4 9.7 9.9 9.7  ALKPHOS 59  --   --   --   --   --   AST 18  --   --   --   --   --   ALT 26  --   --   --   --   --   ALBUMIN 3.7  --   --   --   --   --     Results/Tests Pending at Time of Discharge: IgG4, f/u sputum  culture   Discharge Medications:  Allergies as of 05/10/2020      Reactions   Codeine Itching      Medication List    STOP taking these medications   montelukast 10 MG tablet Commonly known as: SINGULAIR   oxyCODONE-acetaminophen 5-325 MG tablet Commonly known as: PERCOCET/ROXICET   spironolactone 100 MG tablet Commonly known as: ALDACTONE     TAKE these medications   acetaminophen 325 MG tablet Commonly known as: TYLENOL Take 2 tablets (650 mg total) by mouth every 6 (six) hours. What changed:   when to take this  reasons to take this   aspirin EC 81 MG tablet Take 81 mg by mouth at bedtime.   citalopram 20 MG tablet Commonly known as: CELEXA Take 20 mg by mouth daily.   diltiazem 30 MG tablet Commonly known as: Cardizem Take 1 tablet every 4 hours AS NEEDED for heart rate >100 as long as top blood pressure >100. What changed:   how much to take  how to take this  when to take this  additional instructions   famotidine 20 MG tablet Commonly known as: PEPCID Take 20 mg by mouth daily as needed for indigestion.   fexofenadine 180 MG tablet Commonly known as: ALLEGRA Take 180 mg by mouth daily.   gabapentin 300 MG capsule Commonly known as: NEURONTIN Take 300 mg by mouth 3 (three) times daily.   Levothyroxine Sodium 125 MCG Caps Take 125 mcg by mouth daily before breakfast.   ondansetron 4 MG tablet Commonly known as: ZOFRAN Take 1 tablet (4 mg total) by mouth every 8 (eight) hours as needed for nausea.   oxyCODONE 5 MG immediate release tablet Commonly known as: Oxy IR/ROXICODONE Take 0.5-1 tablets (2.5-5 mg total) by mouth every 4 (four) hours as needed for breakthrough pain.   Restasis 0.05 % ophthalmic emulsion Generic drug: cycloSPORINE Place 1 drop into both eyes 2 (two) times daily.   sodium chloride 0.65 % Soln nasal spray Commonly known as: OCEAN Place 1 spray into both nostrils as needed for congestion.   traZODone 150 MG  tablet Commonly known as: DESYREL Take 150 mg by mouth at bedtime.   Vitamin D 50 MCG (2000 UT) Caps Take 2,000 Units by mouth daily.       Discharge Instructions: Please refer to Patient Instructions section of EMR for full details.  Patient was counseled important signs and symptoms that should prompt return to medical care, changes in medications, dietary instructions, activity restrictions, and follow up appointments.   Follow-Up Appointments:  Follow-up Information    Kirstie Peri, MD. Schedule an appointment as soon as possible for a visit on 05/12/2020.   Specialty: Internal Medicine Why: make an appointment with your PCP  Contact information: [address] [place] [address] [phone]        Antoine Poche, MD .   Specialty: Cardiology Contact  information: [place] [address] [place] Follow up.   Why: [address], [place], [place] [address] Phone number: [phone]              Lattie Haw, MD 05/10/2020, 3:47 PM PGY-2, [place]

## 2020-05-12 ENCOUNTER — Telehealth: Payer: Self-pay | Admitting: Family Medicine

## 2020-05-12 ENCOUNTER — Telehealth: Payer: Self-pay

## 2020-05-12 LAB — CULTURE, RESPIRATORY W GRAM STAIN: Culture: NORMAL

## 2020-05-12 NOTE — Telephone Encounter (Signed)
Called patient with sputum results. All questions answered.  Terisa Starr, MD  Family Medicine Teaching Service

## 2020-05-12 NOTE — Telephone Encounter (Signed)
Spoke with patient, she has been scheduled for a new patient appointment with Dr. Adela Lank on Thursday, 06/10/2020 at 1:20 PM. Patient had no concerns at the end of the call.

## 2020-05-12 NOTE — Telephone Encounter (Signed)
-----   Message from Benancio Deeds, MD sent at 05/07/2020  4:53 PM EST ----- Regarding: outpatient follow up [facility] this patient will need follow up with me or an APP within a month of her leaving the hospital. Thanks

## 2020-05-24 ENCOUNTER — Other Ambulatory Visit: Payer: Self-pay | Admitting: Family Medicine

## 2020-05-24 ENCOUNTER — Other Ambulatory Visit (HOSPITAL_COMMUNITY): Payer: Self-pay | Admitting: Family Medicine

## 2020-05-24 ENCOUNTER — Ambulatory Visit (HOSPITAL_COMMUNITY)
Admission: RE | Admit: 2020-05-24 | Discharge: 2020-05-24 | Disposition: A | Discharge status: Home / Self Care | Payer: Medicare Other | Source: Ambulatory Visit | Attending: Family Medicine | Admitting: Family Medicine

## 2020-05-24 ENCOUNTER — Other Ambulatory Visit: Payer: Self-pay

## 2020-05-24 DIAGNOSIS — R112 Nausea with vomiting, unspecified: Secondary | ICD-10-CM

## 2020-05-24 IMAGING — US US ABDOMEN LIMITED
1 series · 13 of 25 positions shown · non-contrast
Comparison: Ultrasound right upper quadrant [DATE]; CT
abdomen and pelvis [DATE]

CLINICAL DATA: Nausea and vomiting

EXAM:
ULTRASOUND ABDOMEN LIMITED RIGHT UPPER QUADRANT

[Series 1: us abdomen limited · 0.21mm/px · 13 of 68 slices shown]
[im 1/68]
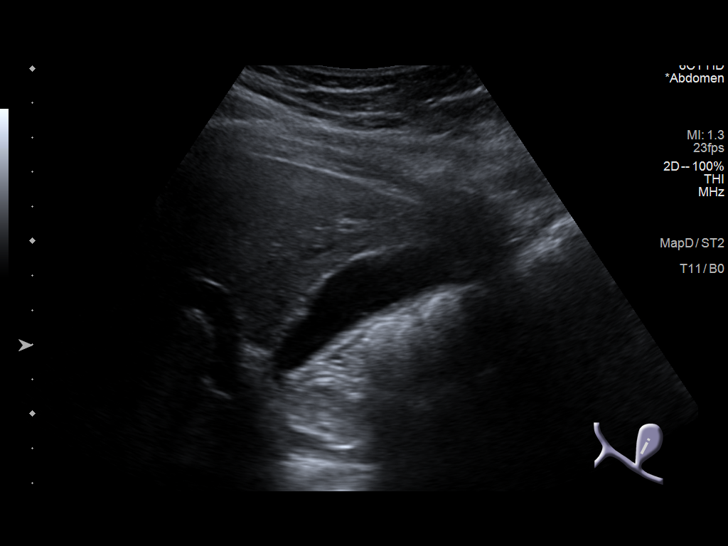
[im 6/68]
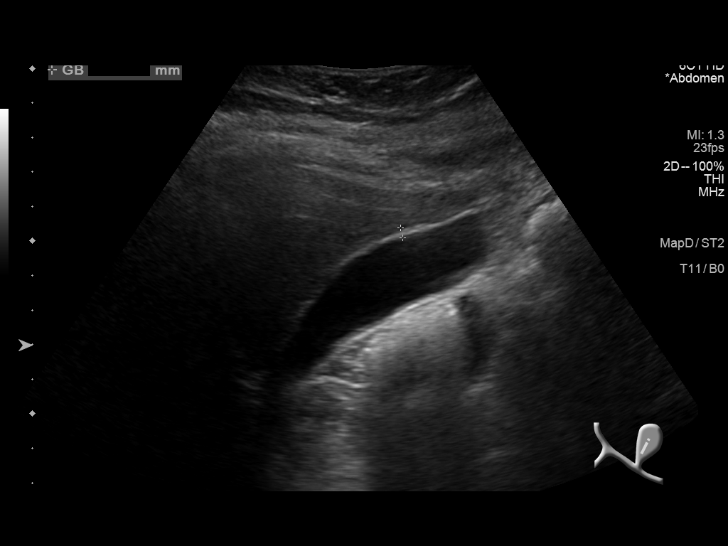
[im 12/68]
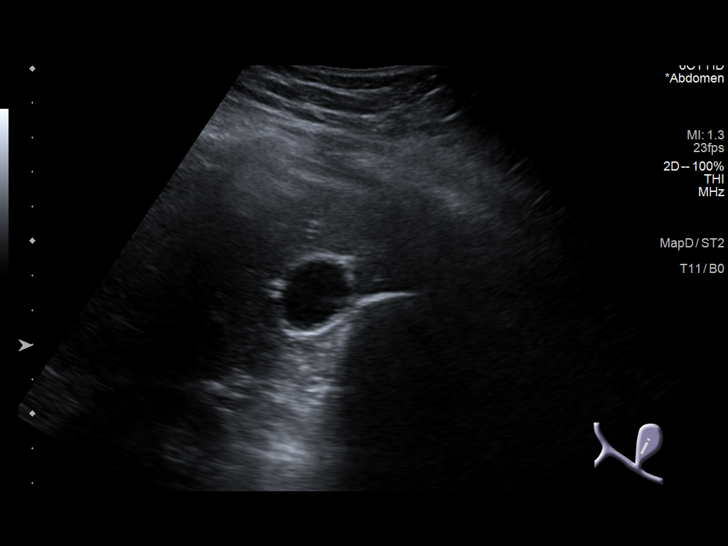
[im 17/68]
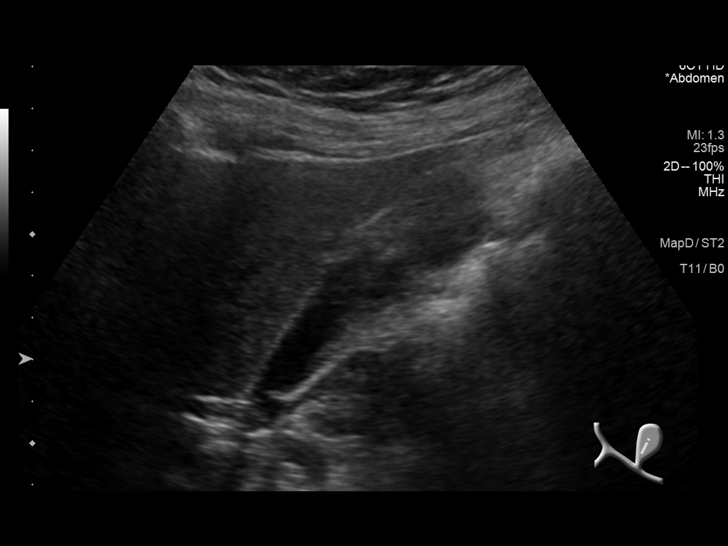
[im 23/68]
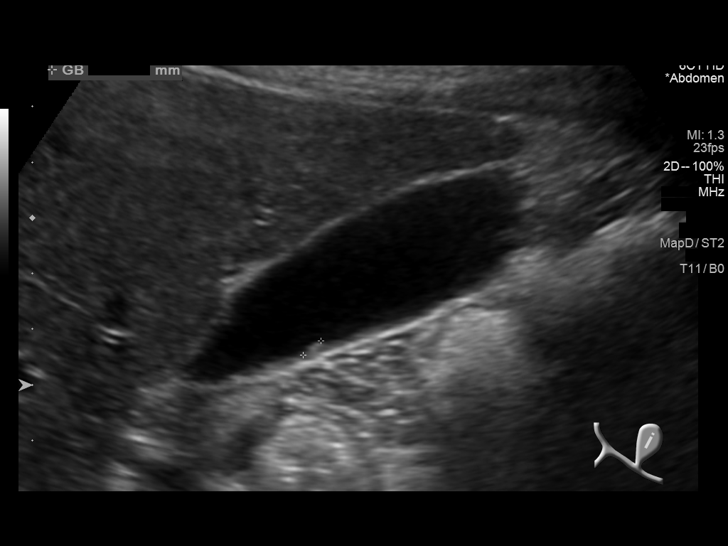
[im 28/68]
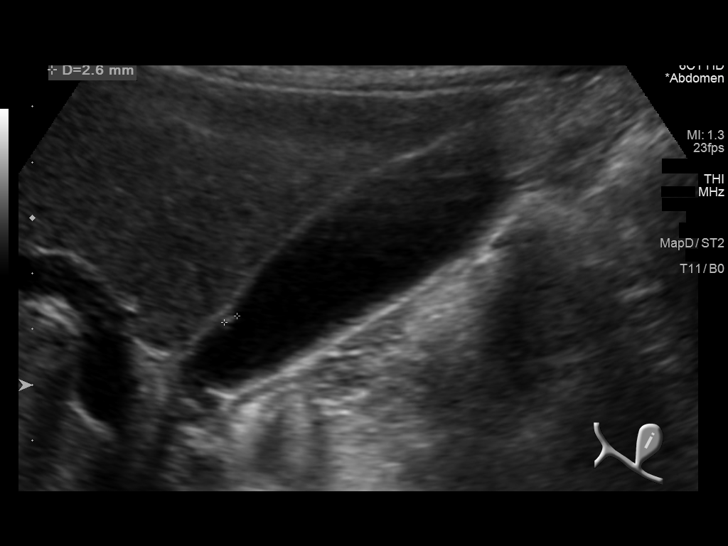
[im 34/68]
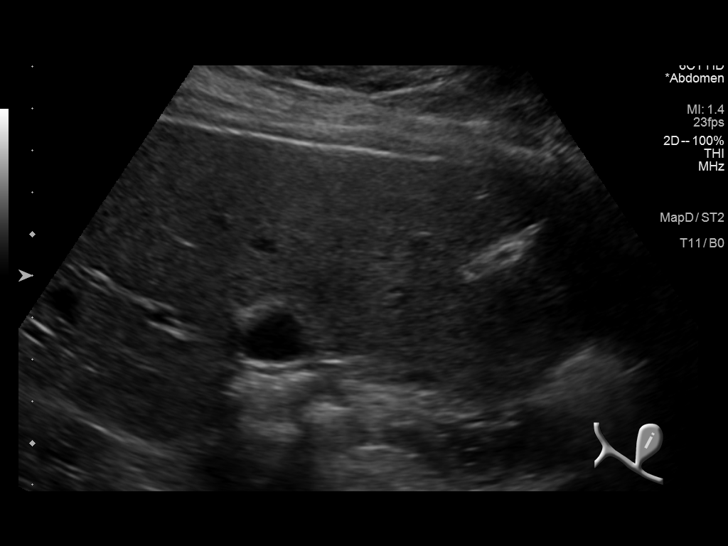
[im 40/68]
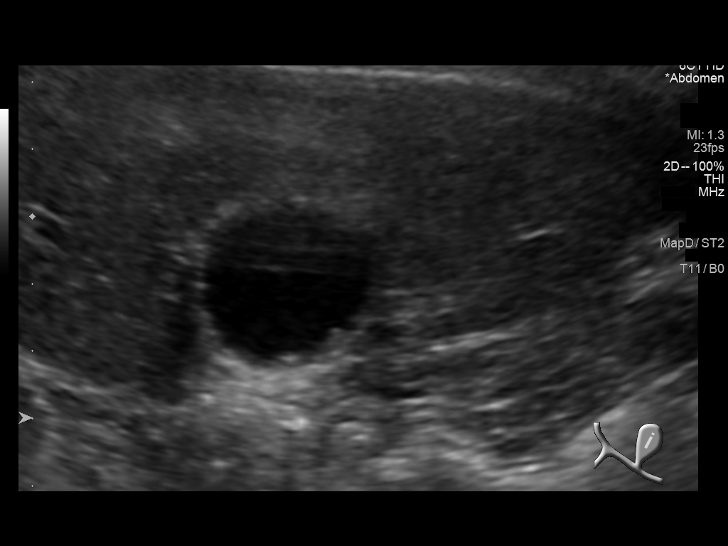
[im 45/68]
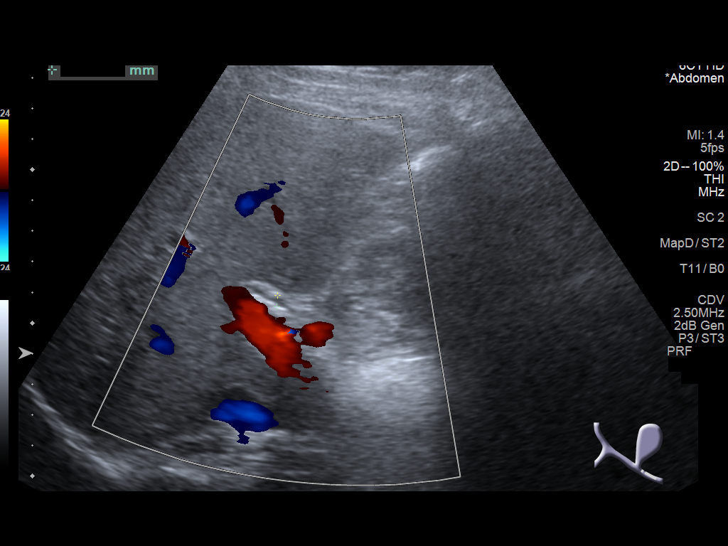
[im 51/68]
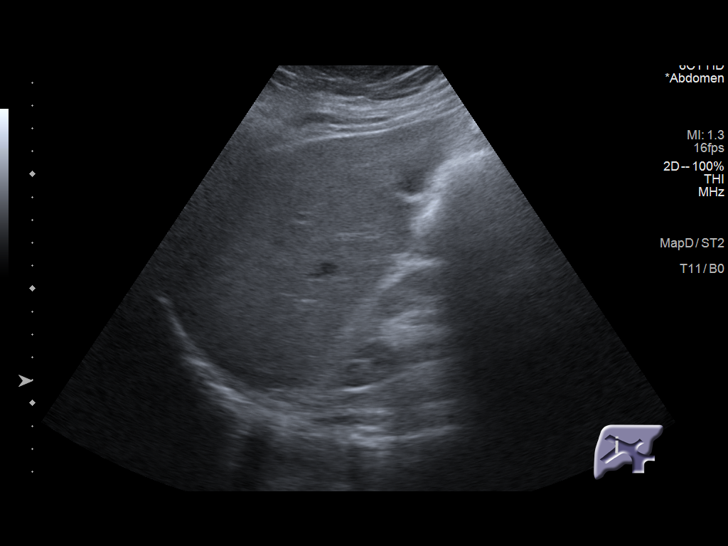
[im 56/68]
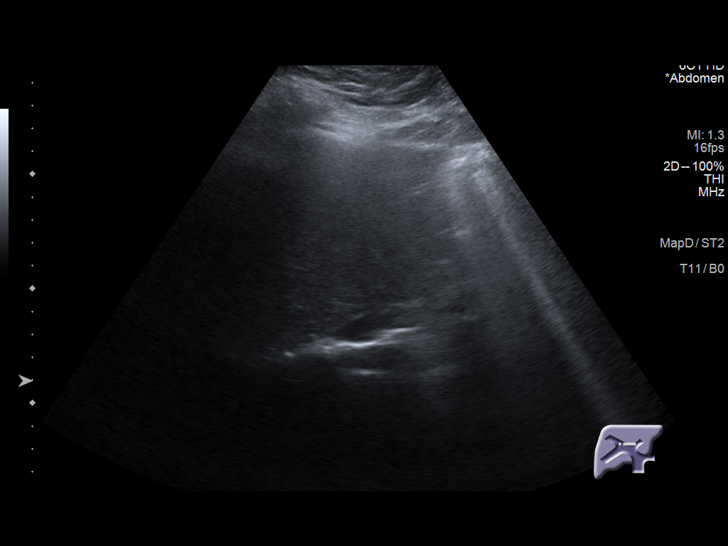
[im 62/68]
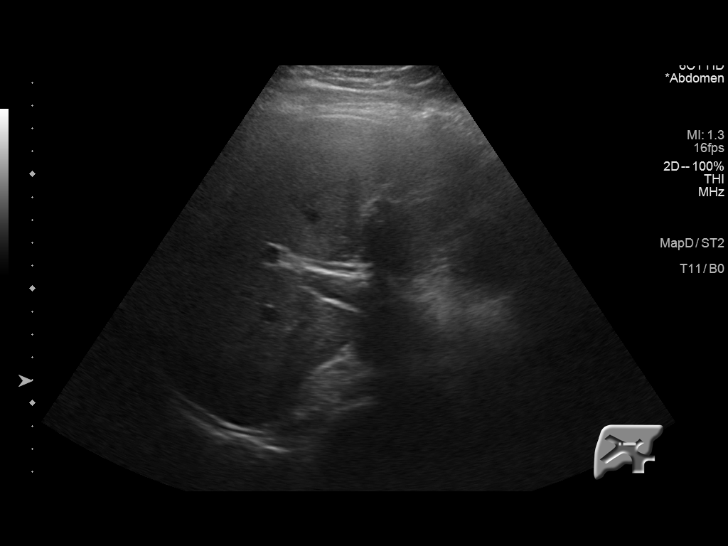
[im 68/68]
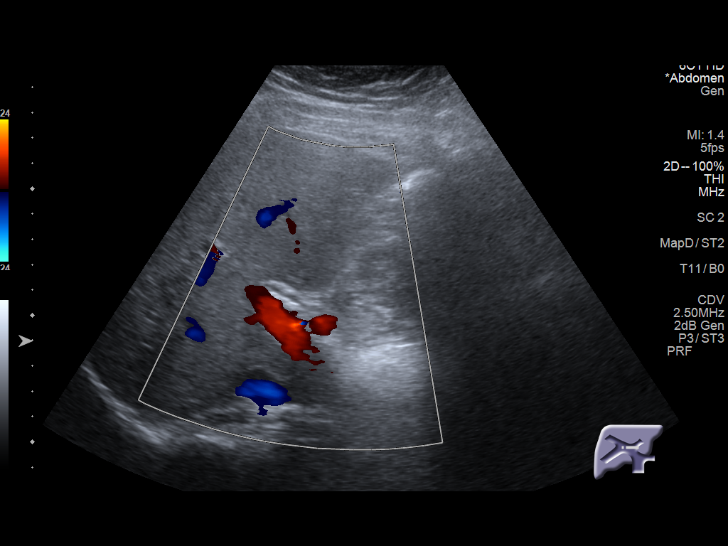

[13 of 25 positions shown; findings below may reference images not displayed]

FINDINGS: Gallbladder:

Within the gallbladder, there is an echogenic focus along the
posterior wall of the gallbladder measuring 4 mm. A 3 mm similar
focus is seen along the anterior aspect of the gallbladder wall.
These foci do not move or shadow. There are no echogenic foci which
move or shadow as is expected with gallstones. No gallbladder wall
thickening or pericholecystic fluid. No sonographic Murphy sign
noted by sonographer.

Common bile duct:

Diameter: 4 mm. No intrahepatic or extrahepatic biliary duct
dilatation.

Liver:

No focal lesion identified. Liver echogenicity overall is increased.
Portal vein is patent on color Doppler imaging with normal direction
of blood flow towards the liver.

Other: None.
IMPRESSION: 1. Apparent polyps along the wall of the gallbladder, largest
measuring 4 mm. Per consensus guidelines, polyps of this size do not
warrant additional imaging surveillance. No evident gallstones,
gallbladder wall thickening, or pericholecystic fluid.

2. Increased liver echogenicity, a finding indicative of hepatic
steatosis. No focal liver lesions are evident.

## 2020-05-26 ENCOUNTER — Emergency Department (HOSPITAL_COMMUNITY): Payer: Medicare Other

## 2020-05-26 ENCOUNTER — Other Ambulatory Visit: Payer: Self-pay

## 2020-05-26 ENCOUNTER — Emergency Department (HOSPITAL_COMMUNITY)
Admission: EM | Admit: 2020-05-26 | Discharge: 2020-05-26 | Disposition: A | Discharge status: Home / Self Care | Payer: Medicare Other | Attending: Emergency Medicine | Admitting: Emergency Medicine

## 2020-05-26 ENCOUNTER — Encounter (HOSPITAL_COMMUNITY): Payer: Self-pay | Admitting: Emergency Medicine

## 2020-05-26 DIAGNOSIS — M549 Dorsalgia, unspecified: Secondary | ICD-10-CM

## 2020-05-26 DIAGNOSIS — K859 Acute pancreatitis without necrosis or infection, unspecified: Secondary | ICD-10-CM | POA: Insufficient documentation

## 2020-05-26 DIAGNOSIS — I10 Essential (primary) hypertension: Secondary | ICD-10-CM | POA: Insufficient documentation

## 2020-05-26 DIAGNOSIS — M546 Pain in thoracic spine: Secondary | ICD-10-CM | POA: Insufficient documentation

## 2020-05-26 DIAGNOSIS — Z79899 Other long term (current) drug therapy: Secondary | ICD-10-CM | POA: Insufficient documentation

## 2020-05-26 DIAGNOSIS — F17210 Nicotine dependence, cigarettes, uncomplicated: Secondary | ICD-10-CM | POA: Diagnosis not present

## 2020-05-26 DIAGNOSIS — R0789 Other chest pain: Secondary | ICD-10-CM

## 2020-05-26 DIAGNOSIS — J45909 Unspecified asthma, uncomplicated: Secondary | ICD-10-CM | POA: Insufficient documentation

## 2020-05-26 DIAGNOSIS — Z7982 Long term (current) use of aspirin: Secondary | ICD-10-CM | POA: Insufficient documentation

## 2020-05-26 DIAGNOSIS — E039 Hypothyroidism, unspecified: Secondary | ICD-10-CM | POA: Diagnosis not present

## 2020-05-26 LAB — HEPATIC FUNCTION PANEL
ALT: 32 U/L (ref 0–44)
AST: 19 U/L (ref 15–41)
Albumin: 4.4 g/dL (ref 3.5–5.0)
Alkaline Phosphatase: 73 U/L (ref 38–126)
Bilirubin, Direct: <0.1 mg/dL (ref 0.0–0.2)
Total Bilirubin: 0.7 mg/dL (ref 0.3–1.2)
Total Protein: 8 g/dL (ref 6.5–8.1)

## 2020-05-26 LAB — CBC
HCT: 49.4 % — ABNORMAL HIGH (ref 36.0–46.0)
Hemoglobin: 15.8 g/dL — ABNORMAL HIGH (ref 12.0–15.0)
MCH: 29.9 pg (ref 26.0–34.0)
MCHC: 32 g/dL (ref 30.0–36.0)
MCV: 93.6 fL (ref 80.0–100.0)
Platelets: 354 10*3/uL (ref 150–400)
RBC: 5.28 MIL/uL — ABNORMAL HIGH (ref 3.87–5.11)
RDW: 13.1 % (ref 11.5–15.5)
WBC: 9.3 10*3/uL (ref 4.0–10.5)
nRBC: 0 % (ref 0.0–0.2)

## 2020-05-26 LAB — BASIC METABOLIC PANEL
Anion gap: 14 (ref 5–15)
BUN: 29 mg/dL — ABNORMAL HIGH (ref 8–23)
CO2: 23 mmol/L (ref 22–32)
Calcium: 10.7 mg/dL — ABNORMAL HIGH (ref 8.9–10.3)
Chloride: 98 mmol/L (ref 98–111)
Creatinine, Ser: 1.32 mg/dL — ABNORMAL HIGH (ref 0.44–1.00)
GFR, Estimated: 45 mL/min — ABNORMAL LOW (ref >60)
Glucose, Bld: 101 mg/dL — ABNORMAL HIGH (ref 70–99)
Potassium: 4.5 mmol/L (ref 3.5–5.1)
Sodium: 135 mmol/L (ref 135–145)

## 2020-05-26 LAB — TROPONIN I (HIGH SENSITIVITY)
Troponin I (High Sensitivity): 5 ng/L (ref <18)
Troponin I (High Sensitivity): 4 ng/L (ref <18)

## 2020-05-26 LAB — LIPASE, BLOOD: Lipase: 28 U/L (ref 11–51)

## 2020-05-26 IMAGING — DX DG CHEST 2V
2 series · 2 of 2 positions shown · non-contrast
Comparison: [DATE]

CLINICAL DATA: Chest pain

EXAM:
CHEST - 2 VIEW

[chest pa]
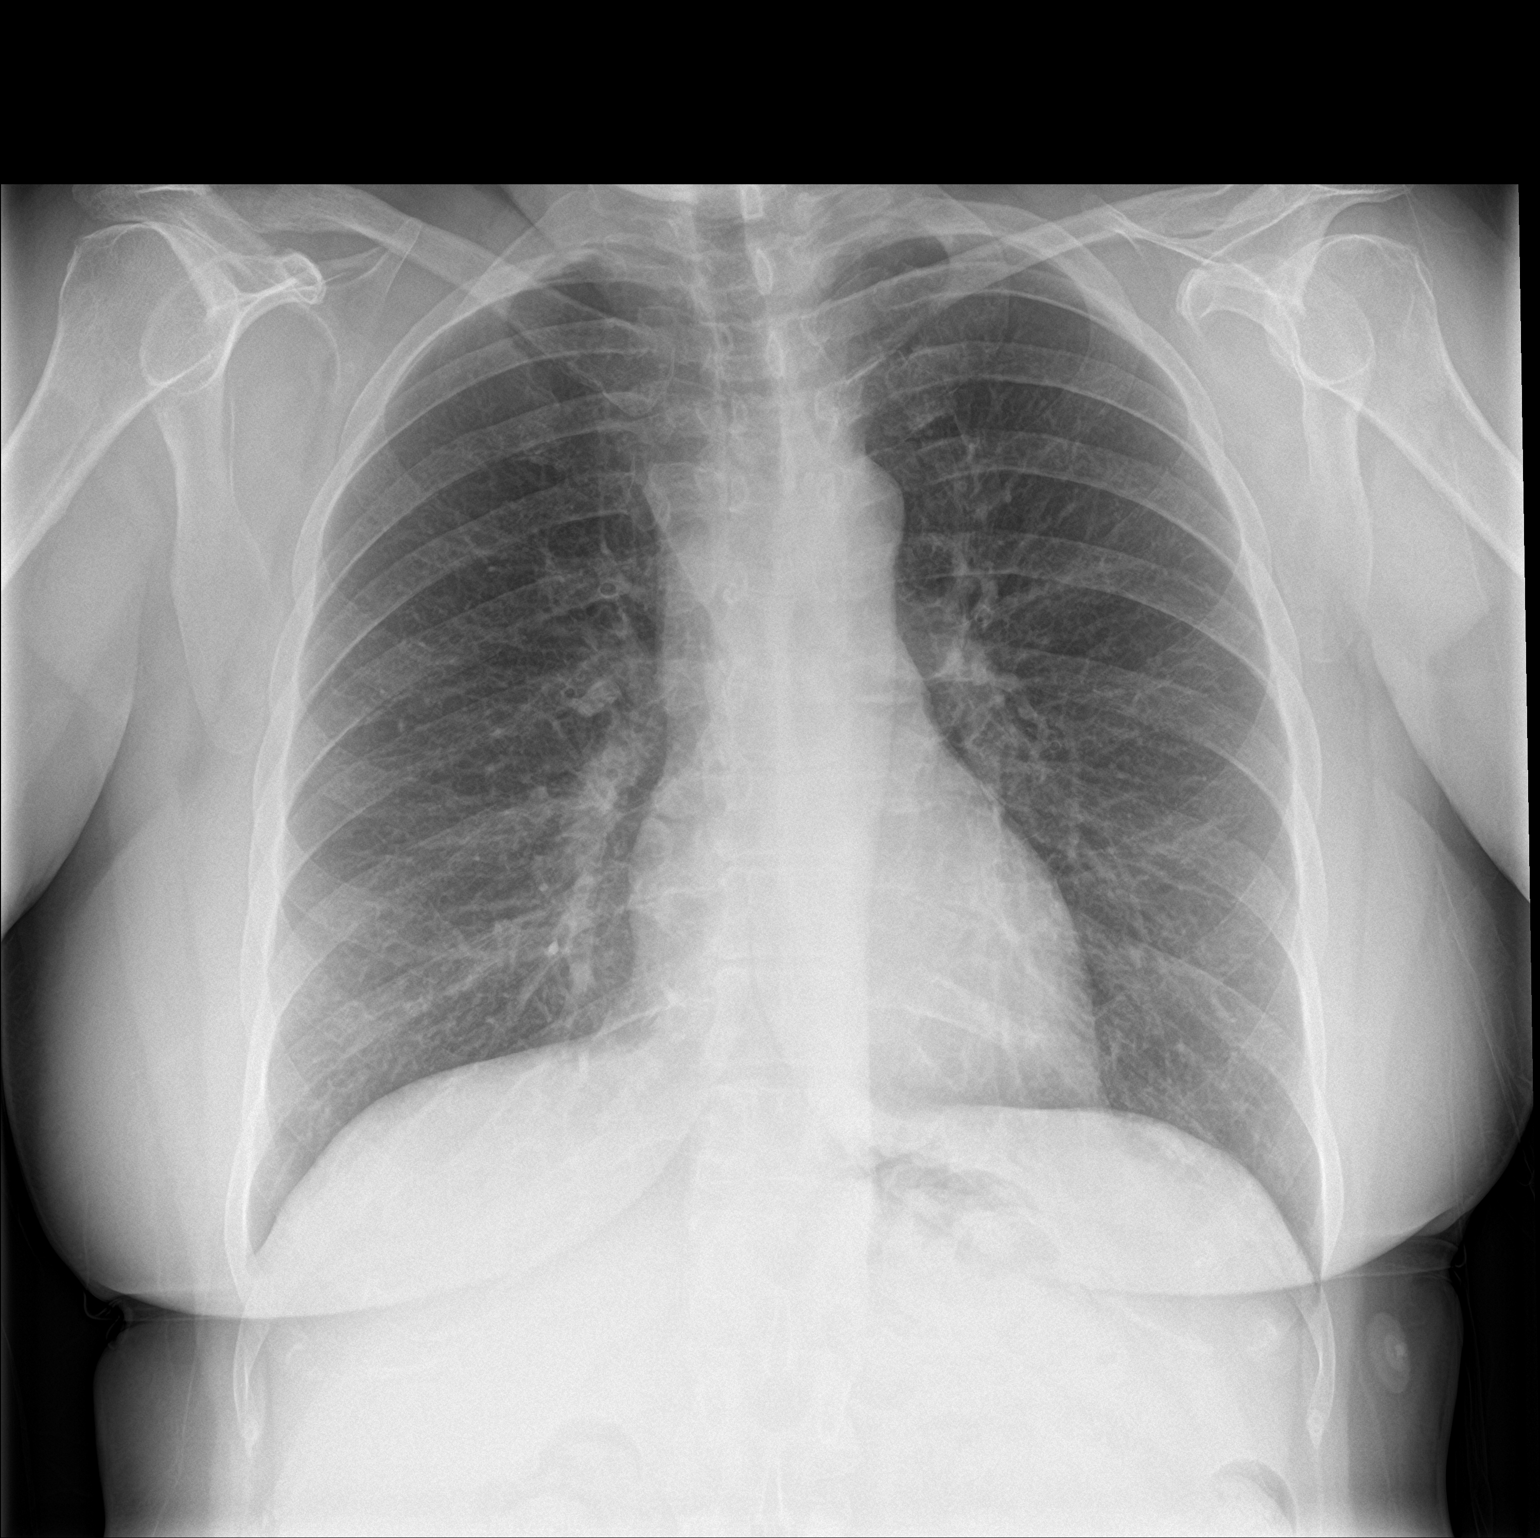

[chest lat]
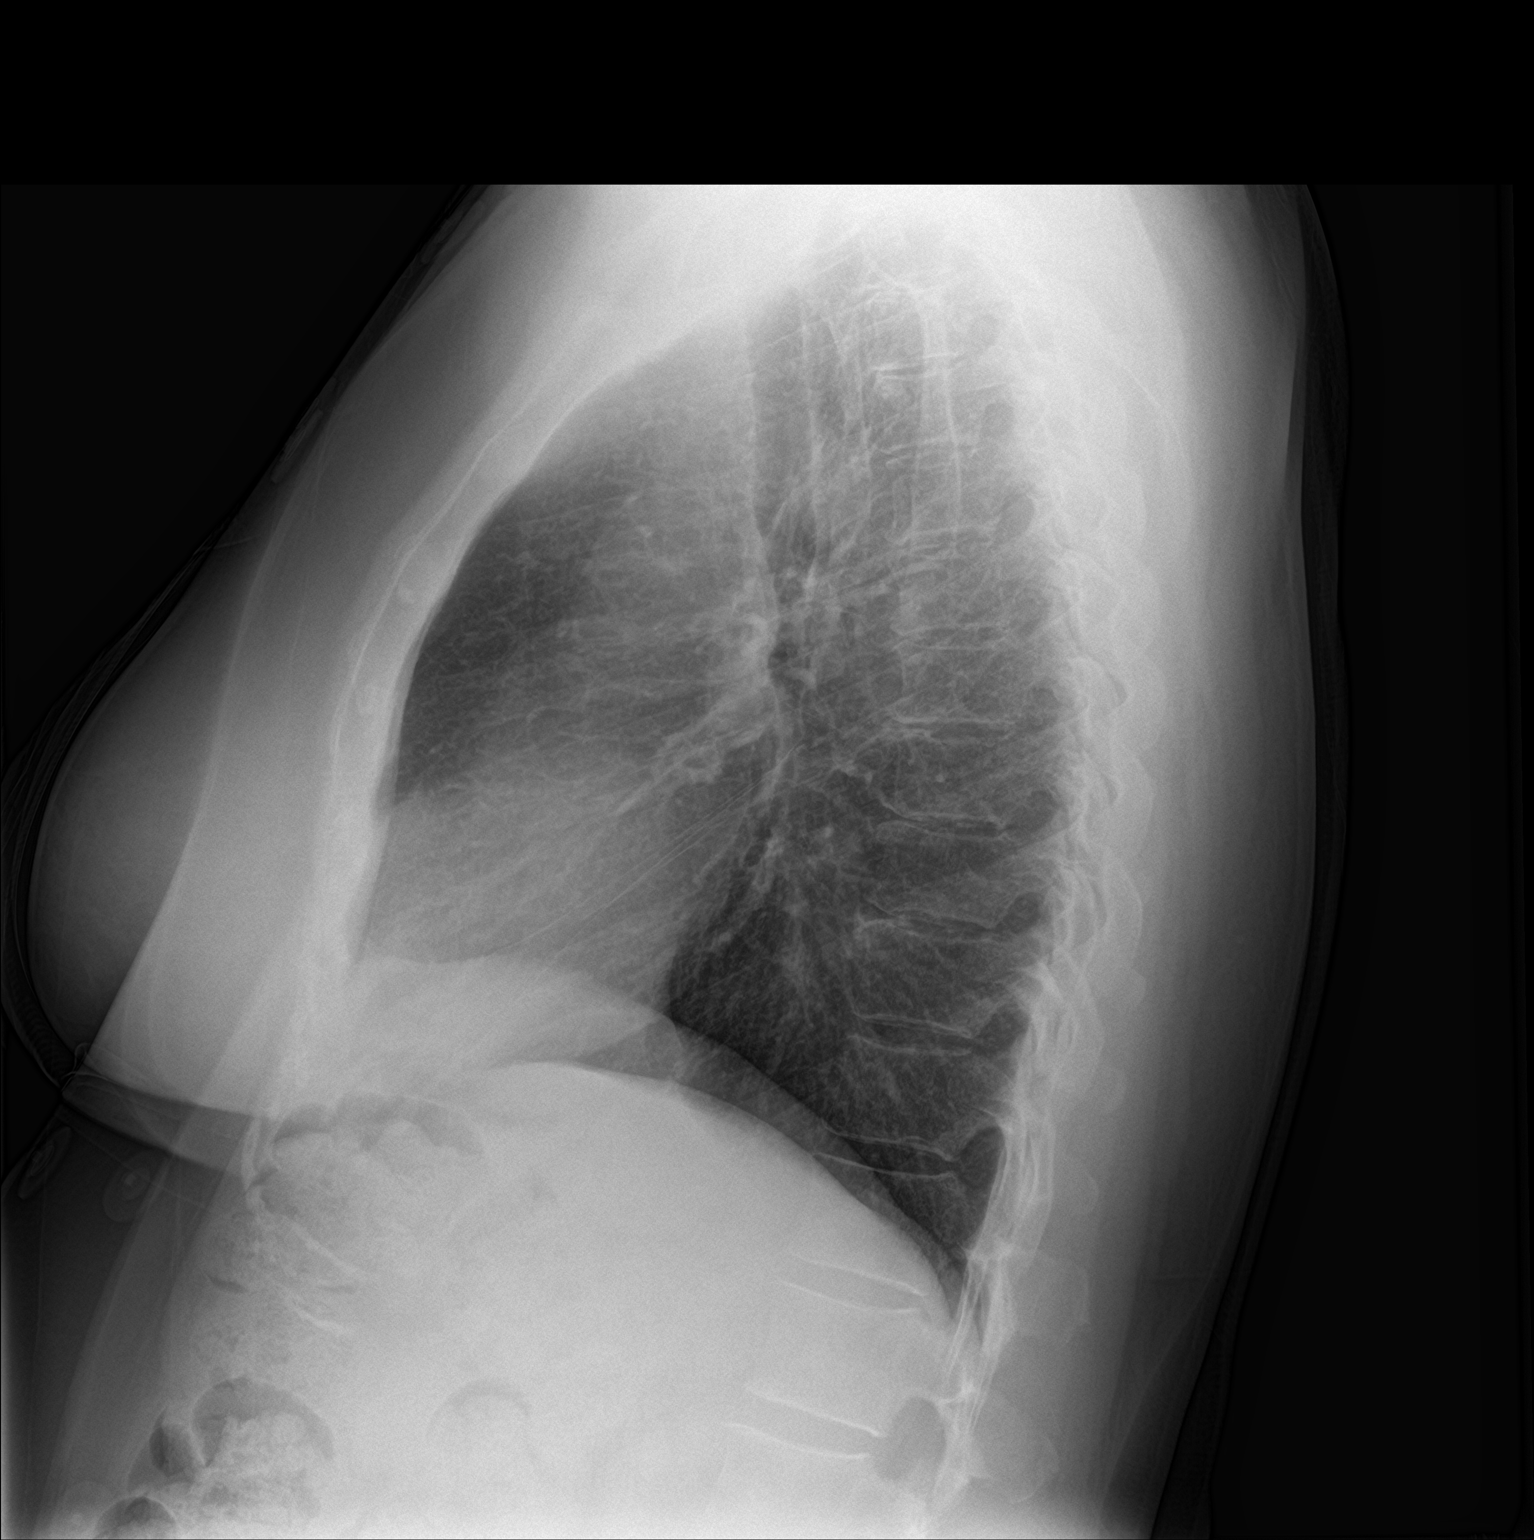

[2 of 2 positions shown; findings below may reference images not displayed]

FINDINGS: The heart size and mediastinal contours are within normal limits.
Both lungs are clear. The visualized skeletal structures are
unremarkable.
IMPRESSION: No active cardiopulmonary disease.

## 2020-05-26 IMAGING — CT CT ANGIO CHEST-ABD-PELV FOR DISSECTION W/ AND WO/W CM
2 of 7 series · 13 of 46 positions shown, 15 images · IV contrast (omnipaque)
Comparison: Radiograph earlier today. Chest CTA PE protocol 2 and
half weeks ago [DATE]. Abdominal CT [DATE]

CLINICAL DATA: Chest pain or back pain, aortic dissection suspected

Patient reports chest pain, shortness of breath and nausea for 5
days.
EXAM:
CT ANGIOGRAPHY CHEST, ABDOMEN AND PELVIS
TECHNIQUE: Non-contrast CT of the chest was initially obtained. Multidetector
CT imaging through the chest, abdomen and pelvis was performed using
the standard protocol during bolus administration of intravenous
contrast. Multiplanar reconstructed images and MIPs were obtained
and reviewed to evaluate the vascular anatomy.
CONTRAST:  100mL OMNIPAQUE IOHEXOL 350 MG/ML SOLN

[Series 6: dissection 3.0 i30f 3 · axial · 0.85mm/px · z∈[+1008,+1602]mm · 10 of 226 slices shown, 12 images]
[im 14/226  soft-tissue]
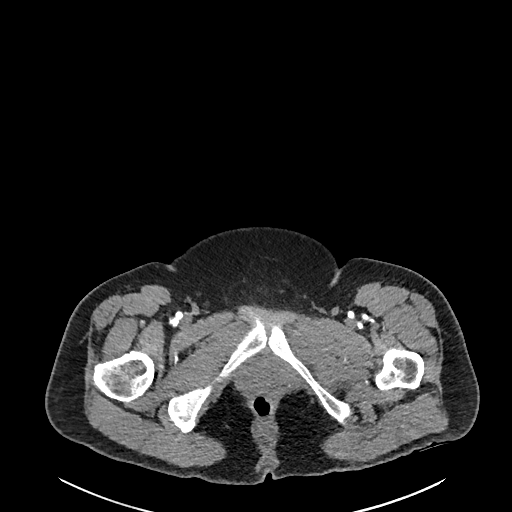
[im 14/226  bone]
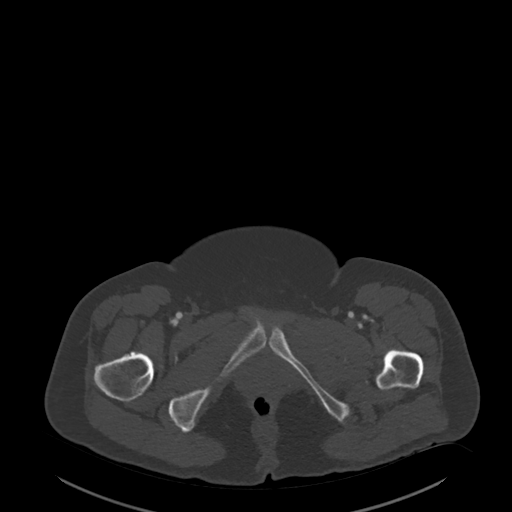
[im 40/226  soft-tissue]
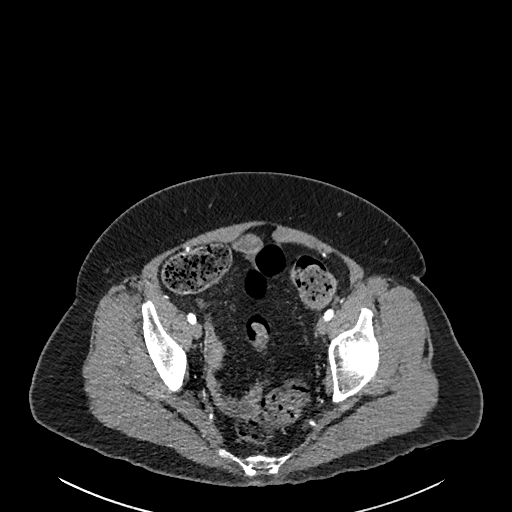
[im 67/226  soft-tissue]
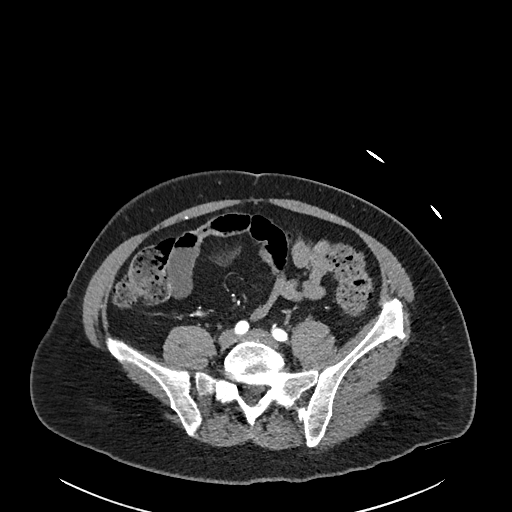
[im 80/226  soft-tissue]
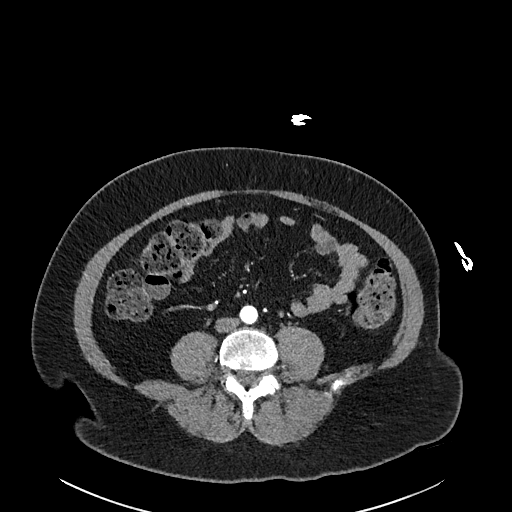
[im 106/226  soft-tissue]
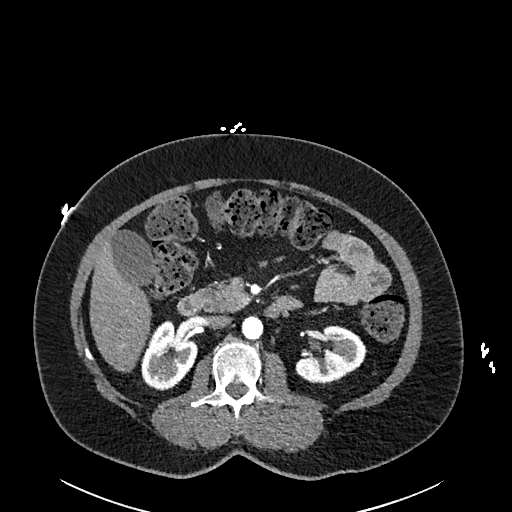
[im 120/226  soft-tissue]
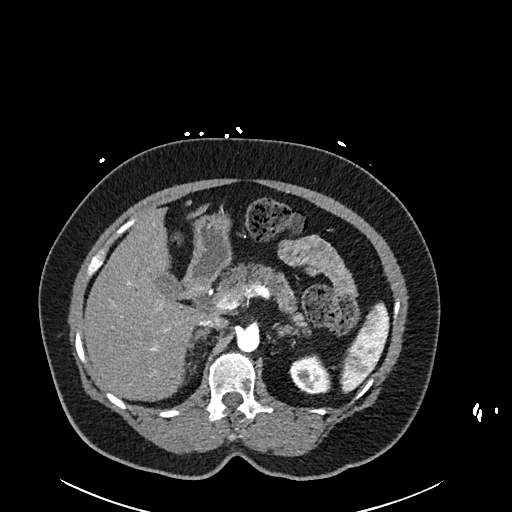
[im 146/226  soft-tissue]
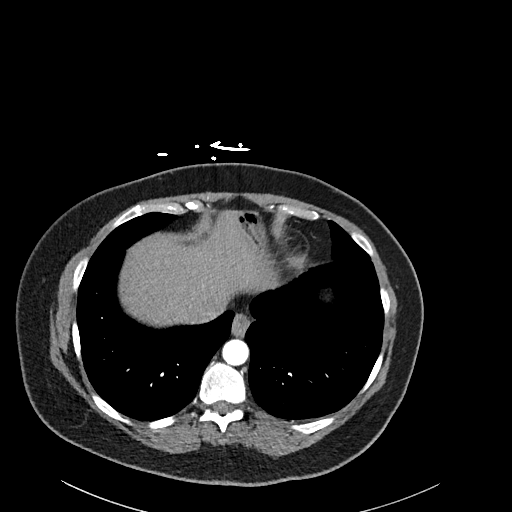
[im 173/226  soft-tissue]
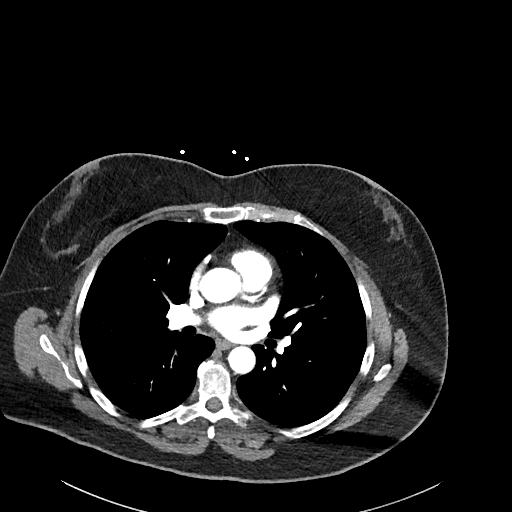
[im 186/226  soft-tissue]
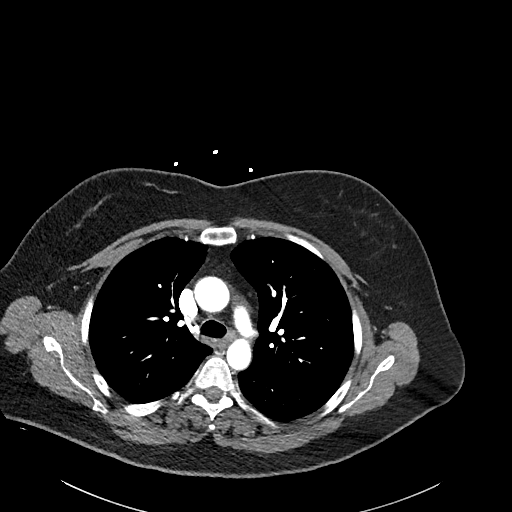
[im 186/226  bone]
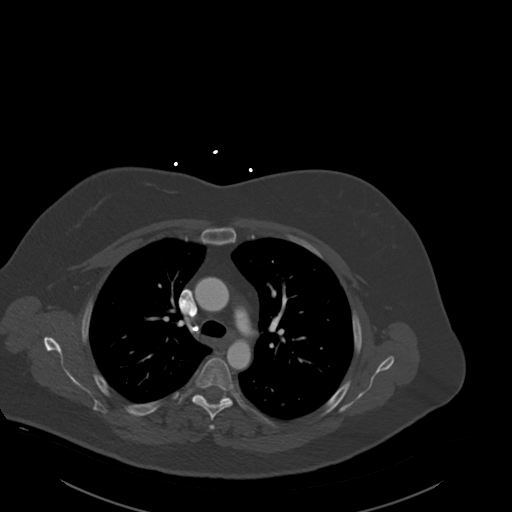
[im 212/226  soft-tissue]
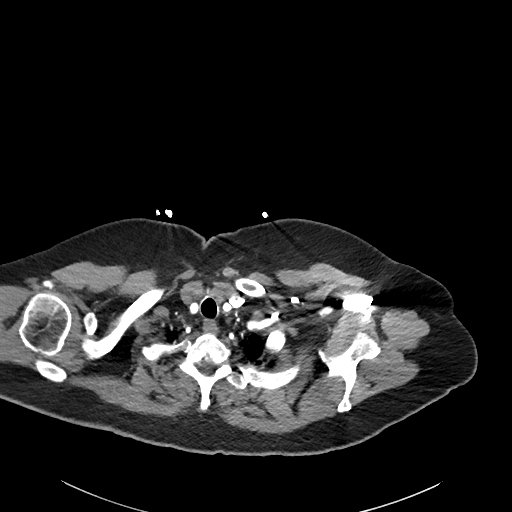

[Series 9: coronals · coronal · 0.79mm/px · 3 of 165 slices shown]
[im 42/165  soft-tissue]
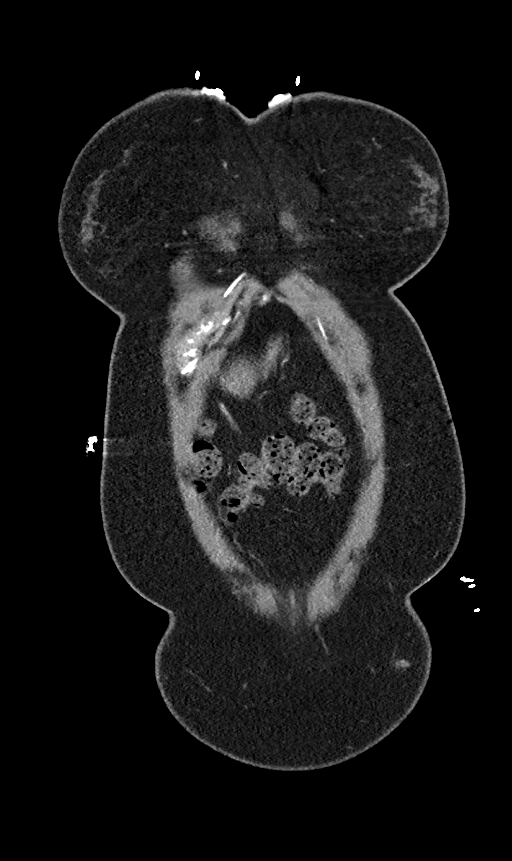
[im 83/165  soft-tissue]
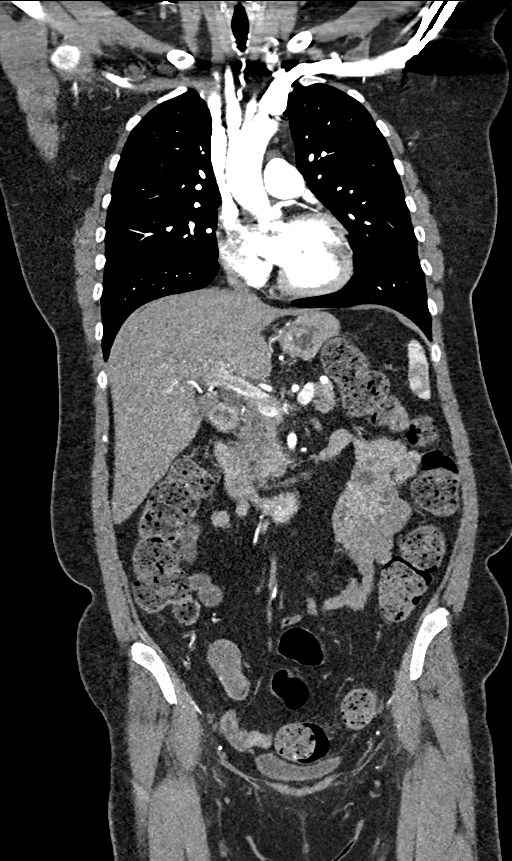
[im 124/165  soft-tissue]
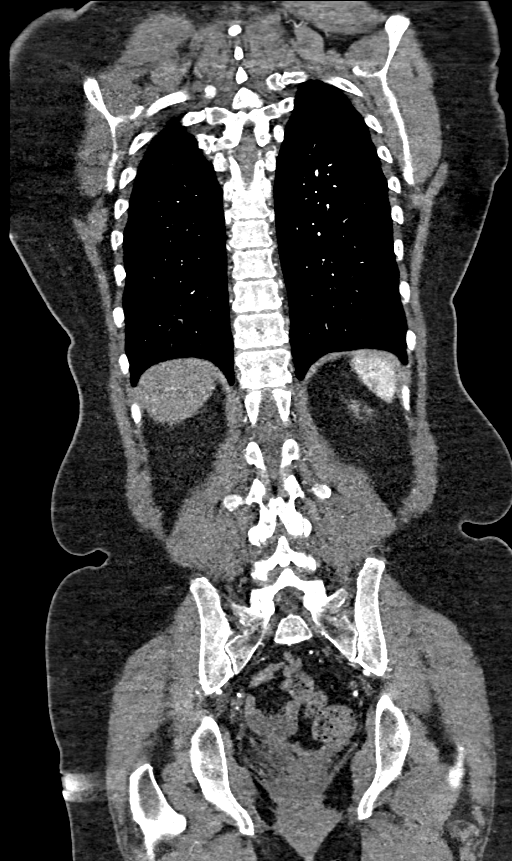

[13 of 46 positions shown; findings below may reference images not displayed]

FINDINGS: CTA CHEST FINDINGS

Cardiovascular: Thoracic aorta is normal in caliber. No aortic
hematoma noncontrast exam. There is no dissection, vasculitis or
evidence of acute aortic syndrome. Conventional branching pattern
from the aortic arch. Aortic branch vessels are patent. The central
pulmonary arteries well opacified to the segmental level. No
pulmonary embolus. Heart is normal in size. No pericardial effusion.

Mediastinum/Nodes: No enlarged mediastinal or hilar lymph nodes. No
visualized thyroid nodule. Minor distal esophageal wall thickening.

Lungs/Pleura: Minor hypoventilatory changes in the dependent lungs.
Central bronchial thickening. No focal consolidation. No pleural
fluid. No findings of pulmonary edema.

Musculoskeletal: Minor thoracic endplate spurring. There are no
acute or suspicious osseous abnormalities.

Review of the MIP images confirms the above findings.

CTA ABDOMEN AND PELVIS FINDINGS

VASCULAR

Aorta: Normal caliber aorta without aneurysm, dissection, vasculitis
or significant stenosis.

Celiac: Patent without evidence of aneurysm, dissection, vasculitis
or significant stenosis.

SMA: Patent without evidence of aneurysm, dissection, vasculitis or
significant stenosis.

Renals: Both renal arteries are patent without evidence of aneurysm,
dissection, vasculitis, fibromuscular dysplasia or significant
stenosis.

IMA: Patent without evidence of aneurysm, dissection, vasculitis or
significant stenosis.

Inflow: Patent without evidence of aneurysm, dissection, vasculitis
or significant stenosis.

Veins: No obvious venous abnormality within the limitations of this
arterial phase study. Contrast mixing in the portal splenic
confluence.

Review of the MIP images confirms the above findings.

NON-VASCULAR

Hepatobiliary: Borderline hepatic steatosis no focal hepatic
abnormality on arterial phase imaging. Gallbladder physiologically
distended, no calcified stone. No biliary dilatation.

Pancreas: Improved peripancreatic fat stranding from last month. No
pancreatic ductal dilatation.

Spleen: Normal in size with normal arterial enhancement.

Adrenals/Urinary Tract: No adrenal nodule. Homogeneous renal
enhancement without hydronephrosis. No focal renal abnormality.
Urinary bladder is unremarkable.

Stomach/Bowel: Tiny hiatal hernia. Stomach is decompressed. There is
no small bowel obstruction or inflammatory change. Normal appendix.
Moderate volume of stool throughout the colon. Sigmoid colon is
tortuous.

Lymphatic: No abdominopelvic adenopathy.

Reproductive: Status post hysterectomy. No adnexal masses.

Other: No free air or ascites. Tiny fat containing umbilical hernia.

Musculoskeletal: There are no acute or suspicious osseous
abnormalities.

Review of the MIP images confirms the above findings.
IMPRESSION: 1. No aortic dissection or acute aortic abnormality. No pulmonary
embolus.
2. Minor distal esophageal wall thickening, can be seen with reflux
or esophagitis.
3. Improved peripancreatic fat stranding from last month.
4. Moderate colonic stool burden with tortuous sigmoid colon,
suggesting constipation.

## 2020-05-26 MED ORDER — ONDANSETRON HCL 4 MG/2ML IJ SOLN
4.0000 mg | Freq: Once | INTRAMUSCULAR | Status: AC
  Administered 2020-05-26: 4 mg via INTRAVENOUS
  Filled 2020-05-26: qty 2

## 2020-05-26 MED ORDER — FENTANYL CITRATE (PF) 100 MCG/2ML IJ SOLN
75.0000 ug | Freq: Once | INTRAMUSCULAR | Status: AC
  Administered 2020-05-26: 75 ug via INTRAVENOUS
  Filled 2020-05-26: qty 2

## 2020-05-26 MED ORDER — ALUM & MAG HYDROXIDE-SIMETH 200-200-20 MG/5ML PO SUSP
30.0000 mL | Freq: Once | ORAL | Status: AC
  Administered 2020-05-26: 30 mL via ORAL
  Filled 2020-05-26: qty 30

## 2020-05-26 MED ORDER — IOHEXOL 350 MG/ML SOLN
100.0000 mL | Freq: Once | INTRAVENOUS | Status: AC | PRN
  Administered 2020-05-26: 100 mL via INTRAVENOUS

## 2020-05-26 NOTE — ED Provider Notes (Signed)
[facility] Provider Note   CSN: [id number] Arrival date & time: 05/26/20  1045     History Chief Complaint  Patient presents with  . Chest Pain  . Back Pain    Stephanie Ewing is a 65 y.o. female past medical history of pancreatitis, hypertension, presenting to the emergency department with complaint of mid back pain that began 2 weeks ago though significantly worsened on Friday.  Pain is to her upper mid back favoring the right side.  It is intermittent in nature, sharp pain.  She now has a central chest pain associated that began today.  She has some intermittent shortness of breath.  Symptoms are worse with exertion.  She has some intermittent muscle spasm in her right arm as well.  She is being followed by her PCP and was seen on Friday and prescribed oxycodone, however she states only took 3 doses because she did not want to "mask her symptoms."  No nausea or diaphoresis.  She was recently admitted for acute pancreatitis on May 06, 2020 though states those symptoms significantly improved and feel very different.  Per review of medical record, during her admission she had a CTA of her chest done because she had new onset of hemoptysis during her stay.  CTA was negative with normal caliber aorta.  No history of CAD.  The history is provided by the patient.       Past Medical History:  Diagnosis Date  . Afib ([place])   . Asthma   . Depression   . Hyperaldosteronism ([place])   . Hypertension   . Hypokalemia   . Insomnia   . Migraine   . Nicotine dependence, cigarettes, uncomplicated   . Thyroid disease    hypothyroid    Patient Active Problem List   Diagnosis Date Noted  . Hemoptysis   . Acute pancreatitis 05/06/2020  . Family history of pancreatic cancer 04/15/2020  . Hospice care patient 04/15/2020  . Palliative care patient 04/15/2020  . Pancreatitis 04/13/2020  . Hypertension     Past Surgical History:  Procedure  Laterality Date  . TOTAL ABDOMINAL HYSTERECTOMY  2015     OB History   No obstetric history on file.     Family History  Problem Relation Age of Onset  . Hypertension Mother   . Thyroid disease Mother        hypothyroidism  . Thyroid cancer Cousin   . Asthma Father   . Heart disease Father        from scarlet fever; died age 32 MVA  . Hypertension Sister   . Hypothyroidism Sister   . Hypertension Maternal Grandmother   . Heart failure Maternal Grandmother   . Diabetes Paternal Grandmother   . Heart failure Paternal Grandmother     Social History   Tobacco Use  . Smoking status: Current Every Day Smoker    Packs/day: 0.50    Types: Cigarettes    Start date: 03/12/1971  . Smokeless tobacco: Never Used  . Tobacco comment: 1 pack daily  Substance Use Topics  . Alcohol use: Not Currently  . Drug use: Never    Home Medications Prior to Admission medications   Medication Sig Start Date End Date Taking? Authorizing Provider  acetaminophen (TYLENOL) 325 MG tablet Take 2 tablets (650 mg total) by mouth every 6 (six) hours. Patient taking differently: Take 650 mg by mouth every 6 (six) hours as needed for mild pain. 04/15/20  Yes [name],  MD  aspirin EC 81 MG tablet Take 81 mg by mouth at bedtime.   Yes [provider]  Cholecalciferol (VITAMIN D) 50 MCG (2000 UT) CAPS Take 2,000 Units by mouth daily.   Yes [provider]  citalopram (CELEXA) 20 MG tablet Take 20 mg by mouth daily.   Yes [provider]  diltiazem (CARDIZEM) 30 MG tablet Take 1 tablet every 4 hours AS NEEDED for heart rate >100 as long as top blood pressure >100. Patient taking differently: Take 30 mg by mouth as directed. Take 1 tablet (30mg )  every 4 hours AS NEEDED for heart rate >100 as long as top blood pressure >100. 09/19/19  Yes Sherran Needs, NP  famotidine (PEPCID) 20 MG tablet Take 20 mg by mouth daily as needed for indigestion.  07/14/19  Yes [provider]  fexofenadine (ALLEGRA) 180 MG tablet Take 180 mg by mouth daily.   Yes [provider]  gabapentin (NEURONTIN) 300 MG capsule Take 300 mg by mouth 3 (three) times daily.    Yes [provider]  ondansetron (ZOFRAN) 4 MG tablet Take 1 tablet (4 mg total) by mouth every 8 (eight) hours as needed for nausea. 05/10/20  Yes Lattie Haw, MD  oxyCODONE (OXY IR/ROXICODONE) 5 MG immediate release tablet Take 0.5-1 tablets (2.5-5 mg total) by mouth every 4 (four) hours as needed for breakthrough pain. 05/10/20  Yes Lattie Haw, MD  RESTASIS 0.05 % ophthalmic emulsion Place 1 drop into both eyes 2 (two) times daily. 07/29/19  Yes [provider]  sodium chloride (OCEAN) 0.65 % SOLN nasal spray Place 1 spray into both nostrils as needed for congestion.   Yes [provider]  spironolactone (ALDACTONE) 100 MG tablet Take 100 mg by mouth daily.   Yes [provider]  SYNTHROID 125 MCG tablet Take 125 mcg by mouth daily. 05/14/20  Yes [provider]  traMADol (ULTRAM) 50 MG tablet Take 25-50 mg by mouth every 6 (six) hours as needed for moderate pain.   Yes [provider]  traZODone (DESYREL) 100 MG tablet Take 100 mg by mouth at bedtime. Taking with 150 mg = 250 mg at bedtime   Yes [provider]  traZODone (DESYREL) 150 MG tablet Take 150 mg by mouth at bedtime. Taking with 100 mg = 250 mg at bedtime 12/28/17  Yes [provider]    Allergies    Codeine  Review of Systems   Review of Systems  All other systems reviewed and are negative.   Physical Exam Updated Vital Signs BP 133/81   Pulse 62   Temp 97.8 F (36.6 C) (Oral)   Resp 16   Ht 5\' 7"  (1.702 m)   Wt 79.8 kg   SpO2 98%   BMI 27.57 kg/m   Physical Exam Vitals and nursing note reviewed.  Constitutional:      General: She is not in acute distress.    Appearance: She is well-developed and well-nourished. She is not ill-appearing.  HENT:     Head:  Normocephalic and atraumatic.  Eyes:     Conjunctiva/sclera: Conjunctivae normal.  Cardiovascular:     Rate and Rhythm: Normal rate and regular rhythm.     Pulses: Normal pulses.     Heart sounds: Normal heart sounds.  Pulmonary:     Effort: Pulmonary effort is normal. No respiratory distress.     Breath sounds: Normal breath sounds.  Chest:     Chest wall: No tenderness.  Abdominal:     General: Bowel sounds are normal.     Palpations: Abdomen is soft.     Tenderness: There is no abdominal tenderness. There is no guarding.     Hernia: No hernia is present.  Musculoskeletal:     Right lower leg: No edema.     Left lower leg: No edema.     Comments: No tenderness to the back or spinous processes.  No skin changes. no calf tenderness or swelling.  Skin:    General: Skin is warm.  Neurological:     Mental Status: She is alert.  Psychiatric:        Mood and Affect: Mood and affect normal.        Behavior: Behavior normal.     ED Results / Procedures / Treatments   Labs (all labs ordered are listed, but only abnormal results are displayed) Labs Reviewed  BASIC METABOLIC PANEL - Abnormal; Notable for the following components:      Result Value   Glucose, Bld 101 (*)    BUN 29 (*)    Creatinine, Ser 1.32 (*)    Calcium 10.7 (*)    GFR, Estimated 45 (*)    All other components within normal limits  CBC - Abnormal; Notable for the following components:   RBC 5.28 (*)    Hemoglobin 15.8 (*)    HCT 49.4 (*)    All other components within normal limits  HEPATIC FUNCTION PANEL  LIPASE, BLOOD  TROPONIN I (HIGH SENSITIVITY)  TROPONIN I (HIGH SENSITIVITY)    EKG EKG Interpretation  Date/Time:  Wednesday May 26 2020 11:12:57 EST Ventricular Rate:  83 PR Interval:  134 QRS Duration: 84 QT Interval:  370 QTC Calculation: 434 R Axis:   78 Text Interpretation: Normal sinus rhythm Normal ECG NSR, no STEMI Confirmed by Lavenia Atlas [phone]) on 05/26/2020 6:17:59  PM   Radiology DG Chest 2 View  Result Date: 05/26/2020 CLINICAL DATA:  Chest pain EXAM: CHEST - 2 VIEW COMPARISON:  05/09/2020 FINDINGS: The heart size and mediastinal contours are within normal limits. Both lungs are clear. The visualized skeletal structures are unremarkable. IMPRESSION: No active cardiopulmonary disease. Electronically Signed   By: Kathreen Devoid   On: 05/26/2020 11:44   CT Angio Chest/Abd/Pel for Dissection W and/or W/WO  Result Date: 05/26/2020 CLINICAL DATA:  Chest pain or back pain, aortic dissection suspected Patient reports chest pain, shortness of breath and nausea for 5 days. EXAM: CT ANGIOGRAPHY CHEST, ABDOMEN AND PELVIS TECHNIQUE: Non-contrast CT of the chest was initially obtained. Multidetector CT imaging through the chest, abdomen and pelvis was performed using the standard protocol during bolus administration of intravenous contrast. Multiplanar reconstructed images and MIPs were obtained and reviewed to evaluate the vascular anatomy. CONTRAST:  165mL OMNIPAQUE IOHEXOL 350 MG/ML SOLN COMPARISON:  Radiograph earlier today. Chest CTA PE protocol 2 and half weeks ago 05/09/2020. Abdominal CT 05/06/2020 FINDINGS: CTA CHEST FINDINGS Cardiovascular: Thoracic aorta is normal in caliber. No aortic hematoma noncontrast exam. There is no dissection, vasculitis or evidence of acute aortic syndrome. Conventional branching pattern from the aortic arch. Aortic branch vessels are patent. The central pulmonary arteries well opacified to the segmental level. No pulmonary embolus. Heart is normal in size. No pericardial effusion. Mediastinum/Nodes: No enlarged mediastinal or hilar lymph nodes. No visualized thyroid nodule. Minor distal esophageal wall thickening. Lungs/Pleura: Minor hypoventilatory changes in the dependent lungs. Central bronchial thickening. No focal consolidation. No pleural fluid. No findings of pulmonary edema.  Musculoskeletal: Minor thoracic endplate spurring. There are  no acute or suspicious osseous abnormalities. Review of the MIP images confirms the above findings. CTA ABDOMEN AND PELVIS FINDINGS VASCULAR Aorta: Normal caliber aorta without aneurysm, dissection, vasculitis or significant stenosis. Celiac: Patent without evidence of aneurysm, dissection, vasculitis or significant stenosis. SMA: Patent without evidence of aneurysm, dissection, vasculitis or significant stenosis. Renals: Both renal arteries are patent without evidence of aneurysm, dissection, vasculitis, fibromuscular dysplasia or significant stenosis. IMA: Patent without evidence of aneurysm, dissection, vasculitis or significant stenosis. Inflow: Patent without evidence of aneurysm, dissection, vasculitis or significant stenosis. Veins: No obvious venous abnormality within the limitations of this arterial phase study. Contrast mixing in the portal splenic confluence. Review of the MIP images confirms the above findings. NON-VASCULAR Hepatobiliary: Borderline hepatic steatosis no focal hepatic abnormality on arterial phase imaging. Gallbladder physiologically distended, no calcified stone. No biliary dilatation. Pancreas: Improved peripancreatic fat stranding from last month. No pancreatic ductal dilatation. Spleen: Normal in size with normal arterial enhancement. Adrenals/Urinary Tract: No adrenal nodule. Homogeneous renal enhancement without hydronephrosis. No focal renal abnormality. Urinary bladder is unremarkable. Stomach/Bowel: Tiny hiatal hernia. Stomach is decompressed. There is no small bowel obstruction or inflammatory change. Normal appendix. Moderate volume of stool throughout the colon. Sigmoid colon is tortuous. Lymphatic: No abdominopelvic adenopathy. Reproductive: Status post hysterectomy. No adnexal masses. Other: No free air or ascites. Tiny fat containing umbilical hernia. Musculoskeletal: There are no acute or suspicious osseous abnormalities. Review of the MIP images confirms the above  findings. IMPRESSION: 1. No aortic dissection or acute aortic abnormality. No pulmonary embolus. 2. Minor distal esophageal wall thickening, can be seen with reflux or esophagitis. 3. Improved peripancreatic fat stranding from last month. 4. Moderate colonic stool burden with tortuous sigmoid colon, suggesting constipation. Electronically Signed   By: Narda RutherfordMelanie  Sanford M.D.   On: 05/26/2020 17:05    Procedures Procedures (including critical care time)  Medications Ordered in ED Medications  fentaNYL (SUBLIMAZE) injection 75 mcg (75 mcg Intravenous Given 05/26/20 1510)  ondansetron (ZOFRAN) injection 4 mg (4 mg Intravenous Given 05/26/20 1513)  iohexol (OMNIPAQUE) 350 MG/ML injection 100 mL (100 mLs Intravenous Contrast Given 05/26/20 1654)  alum & mag hydroxide-simeth (MAALOX/MYLANTA) 200-200-20 MG/5ML suspension 30 mL (30 mLs Oral Given 05/26/20 1827)    ED Course  I have reviewed the triage vital signs and the nursing notes.  Pertinent labs & imaging results that were available during my care of the patient were reviewed by me and considered in my medical decision making (see chart for details).    MDM Rules/Calculators/A&P                          Patient with recent episode of pancreatitis and hospital admission, presenting for mid upper back pain and chest pain.  Back pain began 2 weeks ago worsening on Friday.  Chest pain began today.  Symptoms are intermittent and stabbing.  Sometimes feels short of breath, sometimes has nausea.  During her admission she had CTA PE study due to new onset of hemoptysis which was negative.  Right upper quadrant ultrasound done outpatient a few days ago was also for stones though did show some gallbladder polyps  On examination she is in no acute distress.  Pain is not reproducible with palpation.  Abdomen is benign.  Vital signs are stable.  Considering presentation with significant back pain and radiating chest pain, dissection study is obtained.  Pain  treated  Dissection study  is very reassuring, no evidence of PE or dissection.  There is some minor distal esophageal wall thickening that could represent an esophagitis.  Improved peripancreatic fat stranding.  Some constipation is also noted.  Laboratory work-up is unremarkable with normal LFTs and lipase, no leukocytosis.  Troponins x2 are negative.  Renal function is at baseline.  EKG is nonischemic.  No evidence of ACS.  No evidence of acute or life-threatening pathology today.  It is possible that esophagitis may be causing patient's symptoms.  Recommend she restart her Protonix, she can also treat with Maalox.  She has appointment with GI next week, encouraged to follow closely.  Also recommend she follow-up with PCP.  Return if symptoms worsen.  She is well-appearing and appropriate for discharge at this time  Discussed results, findings, treatment and follow up. Patient advised of return precautions. Patient verbalized understanding and agreed with plan.  Final Clinical Impression(s) / ED Diagnoses Final diagnoses:  Upper back pain  Atypical chest pain    Rx / DC Orders ED Discharge Orders    None       Arta Stump, [place] N, PA-C 05/26/20 1845    Daleen Bo, MD 05/27/20 1149

## 2020-05-26 NOTE — Discharge Instructions (Addendum)
Please restart your Protonix.  You can also take Maalox or Tums to help soothe any discomfort.  Take your prescribed medications as directed.  Follow close with your primary care provider and GI specialist.  Return if symptoms worsen.

## 2020-05-26 NOTE — ED Triage Notes (Addendum)
Onset 5 days ago chest pain, shortness of breath and nausea. Seen primary doctor and not feeling better. States pain radiating to right arm intermittent and back.

## 2020-05-26 NOTE — ED Notes (Signed)
E-signature pad unavailable at time of pt discharge. This RN discussed discharge materials with pt and answered all pt questions. Pt stated understanding of discharge material. ? ?

## 2020-05-26 NOTE — ED Notes (Signed)
Patient transported to CT 

## 2020-06-10 ENCOUNTER — Encounter: Payer: Self-pay | Admitting: Gastroenterology

## 2020-06-10 ENCOUNTER — Other Ambulatory Visit: Payer: Self-pay

## 2020-06-10 ENCOUNTER — Ambulatory Visit (INDEPENDENT_AMBULATORY_CARE_PROVIDER_SITE_OTHER): Payer: Medicare Other | Admitting: Gastroenterology

## 2020-06-10 VITALS — BP 130/70 | HR 73 | Ht 67.0 in | Wt 177.0 lb

## 2020-06-10 DIAGNOSIS — Z86010 Personal history of colonic polyps: Secondary | ICD-10-CM

## 2020-06-10 DIAGNOSIS — K859 Acute pancreatitis without necrosis or infection, unspecified: Secondary | ICD-10-CM

## 2020-06-10 DIAGNOSIS — K5909 Other constipation: Secondary | ICD-10-CM | POA: Diagnosis not present

## 2020-06-10 DIAGNOSIS — K219 Gastro-esophageal reflux disease without esophagitis: Secondary | ICD-10-CM | POA: Diagnosis not present

## 2020-06-10 MED ORDER — POLYETHYLENE GLYCOL 3350 17 G PO PACK: 17.0000 g | PACK | Freq: Every day | ORAL | 0 refills | Status: DC | PRN

## 2020-06-10 NOTE — Patient Instructions (Addendum)
If you are age 66 or older, your body mass index should be between 23-30. Your Body mass index is 27.72 kg/m. If this is out of the aforementioned range listed, please consider follow up with your Primary Care Provider.  If you are age 42 or younger, your body mass index should be between 19-25. Your Body mass index is 27.72 kg/m. If this is out of the aformentioned range listed, please consider follow up with your Primary Care Provider.   You have been scheduled for an MRCP at [facility] on Monday, 07-12-20. Your appointment time is 8:00am. Please arrive 30 minutes prior to your appointment time for registration purposes. Please make certain not to have anything to eat or drink 4 hours prior to your test. In addition, if you have any metal in your body, have a pacemaker or defibrillator, please be sure to let your ordering physician know. This test typically takes 45 minutes to 1 hour to complete. Should you need to reschedule, please call [phone] to do so.  Please continue Miralax, Zofran and Pepcid.  Hold your Methotrexate and Singulair for now.  You will be due for a recall colonoscopy in 09-2021. We will send you a reminder in the mail when it gets closer to that time.    Thank you for entrusting me with your care and for choosing [facility], Dr. Buena Cellar

## 2020-06-10 NOTE — H&P (View-Only) (Signed)
HPI :  65 year old female here for a follow-up visit with me after hospitalization for pancreatitis in December. I was consulted during her case at that time.  Recall that she has a history of psoriatic arthritis. She was placed on methotrexate in early September. She developed pancreatitis and was hospitalized from November 30 to December 2. Ultrasound at that time showed an unremarkable gallbladder and CBD. Her liver enzymes were normal. She had a CT scan with contrast at that time showing pancreatic head and proximal body edema with peripancreatic fat stranding consistent with acute pancreatitis. She was treated conservatively but discharged when she states she was tolerating p.o. but still had some pain. She states her symptoms never truly resolved and eventually worsened and she was readmitted from December 22 through December 27. During this visit we were consulted to assist in her management. Admission lipase was 74, LFTs normal, creatinine had increased to 1-1/2 with a normal BUN, with the WBC elevation to 11.7. She had a repeat CT scan which showed peripancreatic infiltration at the head of the pancreas consistent with focal pancreatitis. She was again treated conservatively and ultimately improved and discharged.  She has not been drinking any alcohol and never has. She has had lipid testing which looks okay. Her great grandfather had pancreatic cancer but no other family history of pancreatitis or pancreatic cancer. She had been taking Singulair which has been rarely reported to cause pancreatitis and we recommended that she stop it at the time of her last admission. She had an IgG4 sent which was normal.  Since her discharge she has been doing okay from her pancreatitis perspective. She has been able to eat okay and tolerated diet although has been avoiding fatty/greasy foods and is a bit fearful to do so with concern for recurrent pancreatitis. She does have some occasional nausea for which  she takes Zofran and that resolves it. She is not vomiting. Weight is stable. She was unfortunately seen in the ED on January 12 for severe mid back pain. The ER did a CTA to rule out aortic dissection and she had no evidence of that, incidentally her pancreatic inflammation looked better than on prior scans but not resolved. She also had some mild esophageal thickening on that scan. She does have a history of GERD. Uses Pepcid only as needed for symptoms and she states this does not really bother her at all right now. No dysphagia.  She does have constipation chronically. She takes MiraLAX daily which does help provide some regularity. On MiraLAX she has a bowel movement once every other day and is okay with that, she is able to function feels well. If she takes too much MiraLAX the stools get loose and she has problems with leakage. She denies any family history of colon cancer.  She had a colonoscopy in 2013 with 6 polyps removed. Her last colonoscopy was in 2018 and she do not have any polyps but her GI provider recommended a 5-year follow-up for her history of polyps previously. No records of her 2013 exam. This exam was done in [facility].  She otherwise does have a history of chronic tobacco use.   CTA 05/26/20 - IMPRESSION: 1. No aortic dissection or acute aortic abnormality. No pulmonary embolus. 2. Minor distal esophageal wall thickening, can be seen with reflux or esophagitis. 3. Improved peripancreatic fat stranding from last month. 4. Moderate colonic stool burden with tortuous sigmoid colon, suggesting constipation.    Past Medical History:  Diagnosis  Date  . Afib ([place])   . Asthma   . Depression   . Hyperaldosteronism ([place])   . Hypertension   . Hypokalemia   . Insomnia   . Migraine   . Nicotine dependence, cigarettes, uncomplicated   . Pancreatitis   . Thyroid disease    hypothyroid     Past Surgical History:  Procedure Laterality Date  . TOTAL  ABDOMINAL HYSTERECTOMY  2015   Family History  Problem Relation Age of Onset  . Hypertension Mother   . Thyroid disease Mother        hypothyroidism  . Thyroid cancer Cousin   . Asthma Father   . Heart disease Father        from scarlet fever; died age 56 MVA  . Hypertension Sister   . Hypothyroidism Sister   . Hypertension Maternal Grandmother   . Heart failure Maternal Grandmother   . Diabetes Paternal Grandmother   . Heart failure Paternal Grandmother    Social History   Tobacco Use  . Smoking status: Current Every Day Smoker    Packs/day: 0.50    Types: Cigarettes    Start date: 03/12/1971  . Smokeless tobacco: Never Used  . Tobacco comment: 1 pack daily  Substance Use Topics  . Alcohol use: Not Currently  . Drug use: Never   Current Outpatient Medications  Medication Sig Dispense Refill  . acetaminophen (TYLENOL) 325 MG tablet Take 2 tablets (650 mg total) by mouth every 6 (six) hours. (Patient taking differently: Take 650 mg by mouth every 6 (six) hours as needed for mild pain.)    . aspirin EC 81 MG tablet Take 81 mg by mouth at bedtime.    . Cholecalciferol (VITAMIN D) 50 MCG (2000 UT) CAPS Take 2,000 Units by mouth daily.    . citalopram (CELEXA) 20 MG tablet Take 20 mg by mouth daily.    Marland Kitchen diltiazem (CARDIZEM) 30 MG tablet Take 1 tablet every 4 hours AS NEEDED for heart rate >100 as long as top blood pressure >100. (Patient taking differently: Take 30 mg by mouth as directed. Take 1 tablet (30mg )  every 4 hours AS NEEDED for heart rate >100 as long as top blood pressure >100.) 45 tablet 1  . famotidine (PEPCID) 20 MG tablet Take 20 mg by mouth daily as needed for indigestion.     . fexofenadine (ALLEGRA) 180 MG tablet Take 180 mg by mouth daily.    Marland Kitchen gabapentin (NEURONTIN) 300 MG capsule Take 300 mg by mouth 3 (three) times daily.     . ondansetron (ZOFRAN) 4 MG tablet Take 1 tablet (4 mg total) by mouth every 8 (eight) hours as needed for nausea. 30 tablet 0  .  oxyCODONE (OXY IR/ROXICODONE) 5 MG immediate release tablet Take 0.5-1 tablets (2.5-5 mg total) by mouth every 4 (four) hours as needed for breakthrough pain. 10 tablet 0  . polyethylene glycol (MIRALAX) 17 g packet Take 17 g by mouth daily as needed. 14 each 0  . RESTASIS 0.05 % ophthalmic emulsion Place 1 drop into both eyes 2 (two) times daily.    . sodium chloride (OCEAN) 0.65 % SOLN nasal spray Place 1 spray into both nostrils as needed for congestion.    Marland Kitchen spironolactone (ALDACTONE) 100 MG tablet Take 100 mg by mouth daily.    Marland Kitchen SYNTHROID 125 MCG tablet Take 125 mcg by mouth daily.    . traMADol (ULTRAM) 50 MG tablet Take 25-50 mg by mouth every 6 (six) hours  as needed for moderate pain.    . traZODone (DESYREL) 100 MG tablet Take 100 mg by mouth at bedtime. Taking with 150 mg = 250 mg at bedtime    . traZODone (DESYREL) 150 MG tablet Take 150 mg by mouth at bedtime. Taking with 100 mg = 250 mg at bedtime     No current facility-administered medications for this visit.   Allergies  Allergen Reactions  . Codeine Itching     Review of Systems: All systems reviewed and negative except where noted in HPI.    DG Chest 2 View  Result Date: 05/26/2020 CLINICAL DATA:  Chest pain EXAM: CHEST - 2 VIEW COMPARISON:  05/09/2020 FINDINGS: The heart size and mediastinal contours are within normal limits. Both lungs are clear. The visualized skeletal structures are unremarkable. IMPRESSION: No active cardiopulmonary disease. Electronically Signed   By: Kathreen Devoid   On: 05/26/2020 11:44   CT Angio Chest/Abd/Pel for Dissection W and/or W/WO  Result Date: 05/26/2020 CLINICAL DATA:  Chest pain or back pain, aortic dissection suspected Patient reports chest pain, shortness of breath and nausea for 5 days. EXAM: CT ANGIOGRAPHY CHEST, ABDOMEN AND PELVIS TECHNIQUE: Non-contrast CT of the chest was initially obtained. Multidetector CT imaging through the chest, abdomen and pelvis was performed using the  standard protocol during bolus administration of intravenous contrast. Multiplanar reconstructed images and MIPs were obtained and reviewed to evaluate the vascular anatomy. CONTRAST:  149mL OMNIPAQUE IOHEXOL 350 MG/ML SOLN COMPARISON:  Radiograph earlier today. Chest CTA PE protocol 2 and half weeks ago 05/09/2020. Abdominal CT 05/06/2020 FINDINGS: CTA CHEST FINDINGS Cardiovascular: Thoracic aorta is normal in caliber. No aortic hematoma noncontrast exam. There is no dissection, vasculitis or evidence of acute aortic syndrome. Conventional branching pattern from the aortic arch. Aortic branch vessels are patent. The central pulmonary arteries well opacified to the segmental level. No pulmonary embolus. Heart is normal in size. No pericardial effusion. Mediastinum/Nodes: No enlarged mediastinal or hilar lymph nodes. No visualized thyroid nodule. Minor distal esophageal wall thickening. Lungs/Pleura: Minor hypoventilatory changes in the dependent lungs. Central bronchial thickening. No focal consolidation. No pleural fluid. No findings of pulmonary edema. Musculoskeletal: Minor thoracic endplate spurring. There are no acute or suspicious osseous abnormalities. Review of the MIP images confirms the above findings. CTA ABDOMEN AND PELVIS FINDINGS VASCULAR Aorta: Normal caliber aorta without aneurysm, dissection, vasculitis or significant stenosis. Celiac: Patent without evidence of aneurysm, dissection, vasculitis or significant stenosis. SMA: Patent without evidence of aneurysm, dissection, vasculitis or significant stenosis. Renals: Both renal arteries are patent without evidence of aneurysm, dissection, vasculitis, fibromuscular dysplasia or significant stenosis. IMA: Patent without evidence of aneurysm, dissection, vasculitis or significant stenosis. Inflow: Patent without evidence of aneurysm, dissection, vasculitis or significant stenosis. Veins: No obvious venous abnormality within the limitations of this  arterial phase study. Contrast mixing in the portal splenic confluence. Review of the MIP images confirms the above findings. NON-VASCULAR Hepatobiliary: Borderline hepatic steatosis no focal hepatic abnormality on arterial phase imaging. Gallbladder physiologically distended, no calcified stone. No biliary dilatation. Pancreas: Improved peripancreatic fat stranding from last month. No pancreatic ductal dilatation. Spleen: Normal in size with normal arterial enhancement. Adrenals/Urinary Tract: No adrenal nodule. Homogeneous renal enhancement without hydronephrosis. No focal renal abnormality. Urinary bladder is unremarkable. Stomach/Bowel: Tiny hiatal hernia. Stomach is decompressed. There is no small bowel obstruction or inflammatory change. Normal appendix. Moderate volume of stool throughout the colon. Sigmoid colon is tortuous. Lymphatic: No abdominopelvic adenopathy. Reproductive: Status post hysterectomy. No adnexal masses.  Other: No free air or ascites. Tiny fat containing umbilical hernia. Musculoskeletal: There are no acute or suspicious osseous abnormalities. Review of the MIP images confirms the above findings. IMPRESSION: 1. No aortic dissection or acute aortic abnormality. No pulmonary embolus. 2. Minor distal esophageal wall thickening, can be seen with reflux or esophagitis. 3. Improved peripancreatic fat stranding from last month. 4. Moderate colonic stool burden with tortuous sigmoid colon, suggesting constipation. Electronically Signed   By: Keith Rake M.D.   On: 05/26/2020 17:05   US Abdomen Limited RUQ (LIVER/GB)  Result Date: 05/24/2020 CLINICAL DATA:  Nausea and vomiting EXAM: ULTRASOUND ABDOMEN LIMITED RIGHT UPPER QUADRANT COMPARISON:  Ultrasound right upper quadrant April 13, 2020; CT abdomen and pelvis May 06, 2020 FINDINGS: Gallbladder: Within the gallbladder, there is an echogenic focus along the posterior wall of the gallbladder measuring 4 mm. A 3 mm similar focus is  seen along the anterior aspect of the gallbladder wall. These foci do not move or shadow. There are no echogenic foci which move or shadow as is expected with gallstones. No gallbladder wall thickening or pericholecystic fluid. No sonographic Murphy sign noted by sonographer. Common bile duct: Diameter: 4 mm. No intrahepatic or extrahepatic biliary duct dilatation. Liver: No focal lesion identified. Liver echogenicity overall is increased. Portal vein is patent on color Doppler imaging with normal direction of blood flow towards the liver. Other: None. IMPRESSION: 1. Apparent polyps along the wall of the gallbladder, largest measuring 4 mm. Per consensus guidelines, polyps of this size do not warrant additional imaging surveillance. No evident gallstones, gallbladder wall thickening, or pericholecystic fluid. 2. Increased liver echogenicity, a finding indicative of hepatic steatosis. No focal liver lesions are evident. Electronically Signed   By: Lowella Grip III M.D.   On: 05/24/2020 14:43   Lab Results  Component Value Date   WBC 9.3 05/26/2020   HGB 15.8 (H) 05/26/2020   HCT 49.4 (H) 05/26/2020   MCV 93.6 05/26/2020   PLT 354 05/26/2020    Lab Results  Component Value Date   CREATININE 1.32 (H) 05/26/2020   BUN 29 (H) 05/26/2020   NA 135 05/26/2020   K 4.5 05/26/2020   CL 98 05/26/2020   CO2 23 05/26/2020    Lab Results  Component Value Date   ALT 32 05/26/2020   AST 19 05/26/2020   ALKPHOS 73 05/26/2020   BILITOT 0.7 05/26/2020      Physical Exam: BP 130/70   Pulse 73   Ht 5\' 7"  (1.702 m)   Wt 177 lb (80.3 kg)   BMI 27.72 kg/m  Constitutional: Pleasant,well-developed, female in no acute distress. Abdominal: Soft, nondistended, nontender. There are no masses palpable.  Extremities: no edema Lymphadenopathy: No cervical adenopathy noted. Neurological: Alert and oriented to person place and time. Skin: Skin is warm and dry. No rashes noted. Psychiatric: Normal mood  and affect. Behavior is normal.   ASSESSMENT AND PLAN: 65 year old female here for reassessment the following:  Pancreatitis - patient admitted in November with acute pancreatitis of unclear etiology, methotrexate was stopped, again admitted for recurrent pancreatitis in December.  LFTs were normal during these admissions.  Ultrasound shows no evidence of gallstones but she does have a small gallbladder polyp.  Lipids normal.  She does not drink any alcohol at all.  We have reviewed her medication list.  She has stopped methotrexate which can rarely be associated with pancreatitis, unclear if that caused her first episode and she never fully healed from that leading  to another hospitalization or if that is unrelated at all.  She was also taking Singulair at the time of her second hospitalization which has been rarely associated with pancreatitis, she has stopped that just in case.  IgG4 levels normal.  She does have chronic tobacco use which can increase her risk for chronic pancreatitis and would recommend cessation moving forward.  Otherwise we discussed role for follow-up imaging once her pancreatic inflammation has resolved to make sure no subtle or small lesion noted there.  Specifically we discussed MRCP versus EUS.  She wants to pursue the most noninvasive evaluation possible right now.  We will plan on an MRCP of her pancreas in another month or so, she was agreeable to that.  If there is any abnormality on that we may need to pursue EUS.  Otherwise she will continue to hold methotrexate and Singulair, and can slowly advance diet as she tolerates.  Would continue to avoid high fatty foods for now.  She is agrees and will let me know if any recurrence in the interim.  GERD - mild symptoms for which she takes Pepcid as needed.  This does not really bother her much.  She had very mild esophageal thickening on a recent CT scan.  We discussed if she wanted to have an endoscopy or not to further evaluate  this.  She has no dysphagia or alarm symptoms and generally feels well.  She declined EGD right now, can follow-up for this as needed if she changes her mind or develops symptoms.  Chronic constipation - discussed her constipation and regimen so far.  She is fairly happy with MiraLAX once daily, no alarm symptoms.  Her colonoscopy is up-to-date. She can contact me if she wishes to try another regimen in the future.  History of colon polyps - due for surveillance in May 2023 - do not have records of prior exam in 2013, her prior GI physician had recommended a 5 year follow up in 2018.  Makawao Cellar, MD [facility]

## 2020-06-10 NOTE — Progress Notes (Signed)
HPI :  65 year old female here for a follow-up visit with me after hospitalization for pancreatitis in December. I was consulted during her case at that time.  Recall that she has a history of psoriatic arthritis. She was placed on methotrexate in early September. She developed pancreatitis and was hospitalized from November 30 to December 2. Ultrasound at that time showed an unremarkable gallbladder and CBD. Her liver enzymes were normal. She had a CT scan with contrast at that time showing pancreatic head and proximal body edema with peripancreatic fat stranding consistent with acute pancreatitis. She was treated conservatively but discharged when she states she was tolerating p.o. but still had some pain. She states her symptoms never truly resolved and eventually worsened and she was readmitted from December 22 through December 27. During this visit we were consulted to assist in her management. Admission lipase was 74, LFTs normal, creatinine had increased to 1-1/2 with a normal BUN, with the WBC elevation to 11.7. She had a repeat CT scan which showed peripancreatic infiltration at the head of the pancreas consistent with focal pancreatitis. She was again treated conservatively and ultimately improved and discharged.  She has not been drinking any alcohol and never has. She has had lipid testing which looks okay. Her great grandfather had pancreatic cancer but no other family history of pancreatitis or pancreatic cancer. She had been taking Singulair which has been rarely reported to cause pancreatitis and we recommended that she stop it at the time of her last admission. She had an IgG4 sent which was normal.  Since her discharge she has been doing okay from her pancreatitis perspective. She has been able to eat okay and tolerated diet although has been avoiding fatty/greasy foods and is a bit fearful to do so with concern for recurrent pancreatitis. She does have some occasional nausea for which  she takes Zofran and that resolves it. She is not vomiting. Weight is stable. She was unfortunately seen in the ED on January 12 for severe mid back pain. The ER did a CTA to rule out aortic dissection and she had no evidence of that, incidentally her pancreatic inflammation looked better than on prior scans but not resolved. She also had some mild esophageal thickening on that scan. She does have a history of GERD. Uses Pepcid only as needed for symptoms and she states this does not really bother her at all right now. No dysphagia.  She does have constipation chronically. She takes MiraLAX daily which does help provide some regularity. On MiraLAX she has a bowel movement once every other day and is okay with that, she is able to function feels well. If she takes too much MiraLAX the stools get loose and she has problems with leakage. She denies any family history of colon cancer.  She had a colonoscopy in 2013 with 6 polyps removed. Her last colonoscopy was in 2018 and she do not have any polyps but her GI provider recommended a 5-year follow-up for her history of polyps previously. No records of her 2013 exam. This exam was done in [facility].  She otherwise does have a history of chronic tobacco use.   CTA 05/26/20 - IMPRESSION: 1. No aortic dissection or acute aortic abnormality. No pulmonary embolus. 2. Minor distal esophageal wall thickening, can be seen with reflux or esophagitis. 3. Improved peripancreatic fat stranding from last month. 4. Moderate colonic stool burden with tortuous sigmoid colon, suggesting constipation.    Past Medical History:  Diagnosis  Date  . Afib ([place])   . Asthma   . Depression   . Hyperaldosteronism ([place])   . Hypertension   . Hypokalemia   . Insomnia   . Migraine   . Nicotine dependence, cigarettes, uncomplicated   . Pancreatitis   . Thyroid disease    hypothyroid     Past Surgical History:  Procedure Laterality Date  . TOTAL  ABDOMINAL HYSTERECTOMY  2015   Family History  Problem Relation Age of Onset  . Hypertension Mother   . Thyroid disease Mother        hypothyroidism  . Thyroid cancer Cousin   . Asthma Father   . Heart disease Father        from scarlet fever; died age 56 MVA  . Hypertension Sister   . Hypothyroidism Sister   . Hypertension Maternal Grandmother   . Heart failure Maternal Grandmother   . Diabetes Paternal Grandmother   . Heart failure Paternal Grandmother    Social History   Tobacco Use  . Smoking status: Current Every Day Smoker    Packs/day: 0.50    Types: Cigarettes    Start date: 03/12/1971  . Smokeless tobacco: Never Used  . Tobacco comment: 1 pack daily  Substance Use Topics  . Alcohol use: Not Currently  . Drug use: Never   Current Outpatient Medications  Medication Sig Dispense Refill  . acetaminophen (TYLENOL) 325 MG tablet Take 2 tablets (650 mg total) by mouth every 6 (six) hours. (Patient taking differently: Take 650 mg by mouth every 6 (six) hours as needed for mild pain.)    . aspirin EC 81 MG tablet Take 81 mg by mouth at bedtime.    . Cholecalciferol (VITAMIN D) 50 MCG (2000 UT) CAPS Take 2,000 Units by mouth daily.    . citalopram (CELEXA) 20 MG tablet Take 20 mg by mouth daily.    Marland Kitchen diltiazem (CARDIZEM) 30 MG tablet Take 1 tablet every 4 hours AS NEEDED for heart rate >100 as long as top blood pressure >100. (Patient taking differently: Take 30 mg by mouth as directed. Take 1 tablet (30mg )  every 4 hours AS NEEDED for heart rate >100 as long as top blood pressure >100.) 45 tablet 1  . famotidine (PEPCID) 20 MG tablet Take 20 mg by mouth daily as needed for indigestion.     . fexofenadine (ALLEGRA) 180 MG tablet Take 180 mg by mouth daily.    Marland Kitchen gabapentin (NEURONTIN) 300 MG capsule Take 300 mg by mouth 3 (three) times daily.     . ondansetron (ZOFRAN) 4 MG tablet Take 1 tablet (4 mg total) by mouth every 8 (eight) hours as needed for nausea. 30 tablet 0  .  oxyCODONE (OXY IR/ROXICODONE) 5 MG immediate release tablet Take 0.5-1 tablets (2.5-5 mg total) by mouth every 4 (four) hours as needed for breakthrough pain. 10 tablet 0  . polyethylene glycol (MIRALAX) 17 g packet Take 17 g by mouth daily as needed. 14 each 0  . RESTASIS 0.05 % ophthalmic emulsion Place 1 drop into both eyes 2 (two) times daily.    . sodium chloride (OCEAN) 0.65 % SOLN nasal spray Place 1 spray into both nostrils as needed for congestion.    Marland Kitchen spironolactone (ALDACTONE) 100 MG tablet Take 100 mg by mouth daily.    Marland Kitchen SYNTHROID 125 MCG tablet Take 125 mcg by mouth daily.    . traMADol (ULTRAM) 50 MG tablet Take 25-50 mg by mouth every 6 (six) hours  as needed for moderate pain.    . traZODone (DESYREL) 100 MG tablet Take 100 mg by mouth at bedtime. Taking with 150 mg = 250 mg at bedtime    . traZODone (DESYREL) 150 MG tablet Take 150 mg by mouth at bedtime. Taking with 100 mg = 250 mg at bedtime     No current facility-administered medications for this visit.   Allergies  Allergen Reactions  . Codeine Itching     Review of Systems: All systems reviewed and negative except where noted in HPI.    DG Chest 2 View  Result Date: 05/26/2020 CLINICAL DATA:  Chest pain EXAM: CHEST - 2 VIEW COMPARISON:  05/09/2020 FINDINGS: The heart size and mediastinal contours are within normal limits. Both lungs are clear. The visualized skeletal structures are unremarkable. IMPRESSION: No active cardiopulmonary disease. Electronically Signed   By: Kathreen Devoid   On: 05/26/2020 11:44   CT Angio Chest/Abd/Pel for Dissection W and/or W/WO  Result Date: 05/26/2020 CLINICAL DATA:  Chest pain or back pain, aortic dissection suspected Patient reports chest pain, shortness of breath and nausea for 5 days. EXAM: CT ANGIOGRAPHY CHEST, ABDOMEN AND PELVIS TECHNIQUE: Non-contrast CT of the chest was initially obtained. Multidetector CT imaging through the chest, abdomen and pelvis was performed using the  standard protocol during bolus administration of intravenous contrast. Multiplanar reconstructed images and MIPs were obtained and reviewed to evaluate the vascular anatomy. CONTRAST:  130mL OMNIPAQUE IOHEXOL 350 MG/ML SOLN COMPARISON:  Radiograph earlier today. Chest CTA PE protocol 2 and half weeks ago 05/09/2020. Abdominal CT 05/06/2020 FINDINGS: CTA CHEST FINDINGS Cardiovascular: Thoracic aorta is normal in caliber. No aortic hematoma noncontrast exam. There is no dissection, vasculitis or evidence of acute aortic syndrome. Conventional branching pattern from the aortic arch. Aortic branch vessels are patent. The central pulmonary arteries well opacified to the segmental level. No pulmonary embolus. Heart is normal in size. No pericardial effusion. Mediastinum/Nodes: No enlarged mediastinal or hilar lymph nodes. No visualized thyroid nodule. Minor distal esophageal wall thickening. Lungs/Pleura: Minor hypoventilatory changes in the dependent lungs. Central bronchial thickening. No focal consolidation. No pleural fluid. No findings of pulmonary edema. Musculoskeletal: Minor thoracic endplate spurring. There are no acute or suspicious osseous abnormalities. Review of the MIP images confirms the above findings. CTA ABDOMEN AND PELVIS FINDINGS VASCULAR Aorta: Normal caliber aorta without aneurysm, dissection, vasculitis or significant stenosis. Celiac: Patent without evidence of aneurysm, dissection, vasculitis or significant stenosis. SMA: Patent without evidence of aneurysm, dissection, vasculitis or significant stenosis. Renals: Both renal arteries are patent without evidence of aneurysm, dissection, vasculitis, fibromuscular dysplasia or significant stenosis. IMA: Patent without evidence of aneurysm, dissection, vasculitis or significant stenosis. Inflow: Patent without evidence of aneurysm, dissection, vasculitis or significant stenosis. Veins: No obvious venous abnormality within the limitations of this  arterial phase study. Contrast mixing in the portal splenic confluence. Review of the MIP images confirms the above findings. NON-VASCULAR Hepatobiliary: Borderline hepatic steatosis no focal hepatic abnormality on arterial phase imaging. Gallbladder physiologically distended, no calcified stone. No biliary dilatation. Pancreas: Improved peripancreatic fat stranding from last month. No pancreatic ductal dilatation. Spleen: Normal in size with normal arterial enhancement. Adrenals/Urinary Tract: No adrenal nodule. Homogeneous renal enhancement without hydronephrosis. No focal renal abnormality. Urinary bladder is unremarkable. Stomach/Bowel: Tiny hiatal hernia. Stomach is decompressed. There is no small bowel obstruction or inflammatory change. Normal appendix. Moderate volume of stool throughout the colon. Sigmoid colon is tortuous. Lymphatic: No abdominopelvic adenopathy. Reproductive: Status post hysterectomy. No adnexal masses.  Other: No free air or ascites. Tiny fat containing umbilical hernia. Musculoskeletal: There are no acute or suspicious osseous abnormalities. Review of the MIP images confirms the above findings. IMPRESSION: 1. No aortic dissection or acute aortic abnormality. No pulmonary embolus. 2. Minor distal esophageal wall thickening, can be seen with reflux or esophagitis. 3. Improved peripancreatic fat stranding from last month. 4. Moderate colonic stool burden with tortuous sigmoid colon, suggesting constipation. Electronically Signed   By: Keith Rake M.D.   On: 05/26/2020 17:05   US Abdomen Limited RUQ (LIVER/GB)  Result Date: 05/24/2020 CLINICAL DATA:  Nausea and vomiting EXAM: ULTRASOUND ABDOMEN LIMITED RIGHT UPPER QUADRANT COMPARISON:  Ultrasound right upper quadrant April 13, 2020; CT abdomen and pelvis May 06, 2020 FINDINGS: Gallbladder: Within the gallbladder, there is an echogenic focus along the posterior wall of the gallbladder measuring 4 mm. A 3 mm similar focus is  seen along the anterior aspect of the gallbladder wall. These foci do not move or shadow. There are no echogenic foci which move or shadow as is expected with gallstones. No gallbladder wall thickening or pericholecystic fluid. No sonographic Murphy sign noted by sonographer. Common bile duct: Diameter: 4 mm. No intrahepatic or extrahepatic biliary duct dilatation. Liver: No focal lesion identified. Liver echogenicity overall is increased. Portal vein is patent on color Doppler imaging with normal direction of blood flow towards the liver. Other: None. IMPRESSION: 1. Apparent polyps along the wall of the gallbladder, largest measuring 4 mm. Per consensus guidelines, polyps of this size do not warrant additional imaging surveillance. No evident gallstones, gallbladder wall thickening, or pericholecystic fluid. 2. Increased liver echogenicity, a finding indicative of hepatic steatosis. No focal liver lesions are evident. Electronically Signed   By: Lowella Grip III M.D.   On: 05/24/2020 14:43   Lab Results  Component Value Date   WBC 9.3 05/26/2020   HGB 15.8 (H) 05/26/2020   HCT 49.4 (H) 05/26/2020   MCV 93.6 05/26/2020   PLT 354 05/26/2020    Lab Results  Component Value Date   CREATININE 1.32 (H) 05/26/2020   BUN 29 (H) 05/26/2020   NA 135 05/26/2020   K 4.5 05/26/2020   CL 98 05/26/2020   CO2 23 05/26/2020    Lab Results  Component Value Date   ALT 32 05/26/2020   AST 19 05/26/2020   ALKPHOS 73 05/26/2020   BILITOT 0.7 05/26/2020      Physical Exam: BP 130/70   Pulse 73   Ht 5\' 7"  (1.702 m)   Wt 177 lb (80.3 kg)   BMI 27.72 kg/m  Constitutional: Pleasant,well-developed, female in no acute distress. Abdominal: Soft, nondistended, nontender. There are no masses palpable.  Extremities: no edema Lymphadenopathy: No cervical adenopathy noted. Neurological: Alert and oriented to person place and time. Skin: Skin is warm and dry. No rashes noted. Psychiatric: Normal mood  and affect. Behavior is normal.   ASSESSMENT AND PLAN: 65 year old female here for reassessment the following:  Pancreatitis - patient admitted in November with acute pancreatitis of unclear etiology, methotrexate was stopped, again admitted for recurrent pancreatitis in December.  LFTs were normal during these admissions.  Ultrasound shows no evidence of gallstones but she does have a small gallbladder polyp.  Lipids normal.  She does not drink any alcohol at all.  We have reviewed her medication list.  She has stopped methotrexate which can rarely be associated with pancreatitis, unclear if that caused her first episode and she never fully healed from that leading  to another hospitalization or if that is unrelated at all.  She was also taking Singulair at the time of her second hospitalization which has been rarely associated with pancreatitis, she has stopped that just in case.  IgG4 levels normal.  She does have chronic tobacco use which can increase her risk for chronic pancreatitis and would recommend cessation moving forward.  Otherwise we discussed role for follow-up imaging once her pancreatic inflammation has resolved to make sure no subtle or small lesion noted there.  Specifically we discussed MRCP versus EUS.  She wants to pursue the most noninvasive evaluation possible right now.  We will plan on an MRCP of her pancreas in another month or so, she was agreeable to that.  If there is any abnormality on that we may need to pursue EUS.  Otherwise she will continue to hold methotrexate and Singulair, and can slowly advance diet as she tolerates.  Would continue to avoid high fatty foods for now.  She is agrees and will let me know if any recurrence in the interim.  GERD - mild symptoms for which she takes Pepcid as needed.  This does not really bother her much.  She had very mild esophageal thickening on a recent CT scan.  We discussed if she wanted to have an endoscopy or not to further evaluate  this.  She has no dysphagia or alarm symptoms and generally feels well.  She declined EGD right now, can follow-up for this as needed if she changes her mind or develops symptoms.  Chronic constipation - discussed her constipation and regimen so far.  She is fairly happy with MiraLAX once daily, no alarm symptoms.  Her colonoscopy is up-to-date. She can contact me if she wishes to try another regimen in the future.  History of colon polyps - due for surveillance in May 2023 - do not have records of prior exam in 2013, her prior GI physician had recommended a 5 year follow up in 2018.  [place] Cellar, MD [facility]

## 2020-06-14 ENCOUNTER — Ambulatory Visit: Payer: BC Managed Care – PPO | Admitting: Cardiology

## 2020-06-15 ENCOUNTER — Encounter: Payer: Self-pay | Admitting: Cardiology

## 2020-06-15 ENCOUNTER — Ambulatory Visit (INDEPENDENT_AMBULATORY_CARE_PROVIDER_SITE_OTHER): Payer: Medicare Other | Admitting: Cardiology

## 2020-06-15 VITALS — BP 130/84 | HR 84 | Ht 67.0 in | Wt 177.6 lb

## 2020-06-15 DIAGNOSIS — I10 Essential (primary) hypertension: Secondary | ICD-10-CM | POA: Diagnosis not present

## 2020-06-15 DIAGNOSIS — I480 Paroxysmal atrial fibrillation: Secondary | ICD-10-CM

## 2020-06-15 DIAGNOSIS — I1A0 Resistant hypertension: Secondary | ICD-10-CM

## 2020-06-15 NOTE — Patient Instructions (Signed)
Your physician recommends that you schedule a follow-up appointment in: [place]  Your physician recommends that you continue on your current medications as directed. Please refer to the Current Medication list given to you today.  Your physician has recommended that you wear an event monitor FOR 30 DAYS. Event monitors are medical devices that record the heart's electrical activity. Doctors most often [place] these monitors to diagnose arrhythmias. Arrhythmias are problems with the speed or rhythm of the heartbeat. The monitor is a small, portable device. You can wear one while you do your normal daily activities. This is usually used to diagnose what is causing palpitations/syncope (passing out).  Thank you for choosing [facility]!!

## 2020-06-15 NOTE — Progress Notes (Signed)
Clinical Summary Stephanie Ewing is a 65 y.o.female  seen today for follow up of the following medical problems.  Previously followed by Dr Ruel Favors at [facility]   1. PAF - beta blocker stopped by endocrinology  CHADS2Vasc score (HTN, women)2, has been on eliquis.  - she reports 2 episodes of afib in setting of hypokalemia.From chart review appears K was low to mid 2 range both times.  - 02/2017 event monitor without afib/aflutter, did have some PACs/PVCs, short runs SVT. - remains on eliquis.   - has bought an apple watch, saw some afib x 2 episodes - can have some palpitations at times.  - has prn dilt just as needed.   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 No evidence of renal artery stenosis by [place]   - she is off telmisartan, bp's look good. Had some significant dizziness on it.   - some chronic pain, bp's running 130s/80s  3. Hyperaldosteronism - followed at [facility]. Recs for medical management after renal vein renin sampling did not localize - aldactone increased to 100mg  daily - still followed at [facility]   4. Leg tremors - being worked up at [facility] by neurology   5. Mild to moderate AI - noted by echo 2018   - 10/2019 echo LVEF 60-65%, mod AI   6. Chest pain - dull pressure mid to right chest, into right shoulder and scapula. Worst with position.Constant for weeks at a time.  - ongoing management for pancreatitis, also cervical radiculopathy management.     Prior caridologt notes 01/18/2017 Echo showed mild aortic valve sclerosis with mild to moderate aortic regurgitation otherwise no significant structural heart disease present. Lexiscan nuclear stress test was normal. 02/05/2017 nuclear stress test: Lexiscan nuclear stress tes tNo significant EKG changes. Perfusion scan is normal with a  normal ejection fraction and no ischemia. This is considered low risk result    Past Medical History:  Diagnosis Date  . Afib ([place])   . Asthma   . Depression   . Hyperaldosteronism ([place])   . Hypertension   . Hypokalemia   . Insomnia   . Migraine   . Nicotine dependence, cigarettes, uncomplicated   . Pancreatitis   . Thyroid disease    hypothyroid     Allergies  Allergen Reactions  . Codeine Itching     Current Outpatient Medications  Medication Sig Dispense Refill  . acetaminophen (TYLENOL) 325 MG tablet Take 2 tablets (650 mg total) by mouth every 6 (six) hours. (Patient taking differently: Take 650 mg by mouth every 6 (six) hours as needed for mild pain.)    . aspirin EC 81 MG tablet Take 81 mg by mouth at bedtime.    . Cholecalciferol (VITAMIN D) 50 MCG (2000 UT) CAPS Take 2,000 Units by mouth daily.    . citalopram (CELEXA) 20 MG tablet Take 20 mg by mouth daily.    Marland Kitchen diltiazem (CARDIZEM) 30 MG tablet Take 1 tablet every 4 hours AS NEEDED for heart rate >100 as long as top blood pressure >100. (Patient taking differently: Take 30 mg by mouth as directed. Take 1 tablet (30mg )  every 4 hours AS NEEDED for heart rate >100 as long as top blood pressure >100.) 45 tablet 1  . famotidine (PEPCID) 20 MG tablet Take 20 mg by mouth daily as needed for indigestion.     Marland Kitchen  fexofenadine (ALLEGRA) 180 MG tablet Take 180 mg by mouth daily.    Marland Kitchen gabapentin (NEURONTIN) 300 MG capsule Take 300 mg by mouth 3 (three) times daily.     . ondansetron (ZOFRAN) 4 MG tablet Take 1 tablet (4 mg total) by mouth every 8 (eight) hours as needed for nausea. 30 tablet 0  . oxyCODONE (OXY IR/ROXICODONE) 5 MG immediate release tablet Take 0.5-1 tablets (2.5-5 mg total) by mouth every 4 (four) hours as needed for breakthrough pain. 10 tablet 0  . polyethylene glycol (MIRALAX) 17 g packet Take 17 g by mouth daily as needed. 14 each 0  . RESTASIS 0.05 % ophthalmic emulsion Place 1 drop into both eyes 2 (two)  times daily.    . sodium chloride (OCEAN) 0.65 % SOLN nasal spray Place 1 spray into both nostrils as needed for congestion.    Marland Kitchen spironolactone (ALDACTONE) 100 MG tablet Take 100 mg by mouth daily.    Marland Kitchen SYNTHROID 125 MCG tablet Take 125 mcg by mouth daily.    . traMADol (ULTRAM) 50 MG tablet Take 25-50 mg by mouth every 6 (six) hours as needed for moderate pain.    . traZODone (DESYREL) 100 MG tablet Take 100 mg by mouth at bedtime. Taking with 150 mg = 250 mg at bedtime    . traZODone (DESYREL) 150 MG tablet Take 150 mg by mouth at bedtime. Taking with 100 mg = 250 mg at bedtime     No current facility-administered medications for this visit.     Past Surgical History:  Procedure Laterality Date  . TOTAL ABDOMINAL HYSTERECTOMY  2015     Allergies  Allergen Reactions  . Codeine Itching      Family History  Problem Relation Age of Onset  . Hypertension Mother   . Thyroid disease Mother        hypothyroidism  . Thyroid cancer Cousin   . Asthma Father   . Heart disease Father        from scarlet fever; died age 83 MVA  . Hypertension Sister   . Hypothyroidism Sister   . Hypertension Maternal Grandmother   . Heart failure Maternal Grandmother   . Diabetes Paternal Grandmother   . Heart failure Paternal Grandmother      Social History [name] reports that she has been smoking cigarettes. She started smoking about 49 years ago. She has been smoking about 0.50 packs per day. She has never used smokeless tobacco. [name] reports previous alcohol use.   Review of Systems CONSTITUTIONAL: No weight loss, fever, chills, weakness or fatigue.  HEENT: Eyes: No visual loss, blurred vision, double vision or yellow sclerae.No hearing loss, sneezing, congestion, runny nose or sore throat.  SKIN: No rash or itching.  CARDIOVASCULAR: per hpi RESPIRATORY: No shortness of breath, cough or sputum.  GASTROINTESTINAL: No anorexia, nausea, vomiting or diarrhea. No abdominal pain  or blood.  GENITOURINARY: No burning on urination, no polyuria NEUROLOGICAL: No headache, dizziness, syncope, paralysis, ataxia, numbness or tingling in the extremities. No change in bowel or bladder control.  MUSCULOSKELETAL: No muscle, back pain, joint pain or stiffness.  LYMPHATICS: No enlarged nodes. No history of splenectomy.  PSYCHIATRIC: No history of depression or anxiety.  ENDOCRINOLOGIC: No reports of sweating, cold or heat intolerance. No polyuria or polydipsia.  Marland Kitchen   Physical Examination Today's Vitals   06/15/20 1338  BP: 130/84  Pulse: 84  SpO2: 98%  Weight: 177 lb 9.6 oz (80.6 kg)  Height: 5\' 7"  (  1.702 m)   Body mass index is 27.82 kg/m.  Gen: resting comfortably, no acute distress HEENT: no scleral icterus, pupils equal round and reactive, no palptable cervical adenopathy,  CV: RRR, no m/rg, no jvd Resp: Clear to auscultation bilaterally GI: abdomen is soft, non-tender, non-distended, normal bowel sounds, no hepatosplenomegaly MSK: extremities are warm, no edema.  Skin: warm, no rash Neuro:  no focal deficits Psych: appropriate affect   Diagnostic Studies  9/2018Nuclear perfusion stress test: 1. Normal myocardial perfusion imaging without ischemia or infarction. 2. Normal post-stress LVEF, 55%. Normal regional wall motion. TID 0.93. 3. Patient achieved 10 METs by Bruce protocol without chest pain or EKG changes but could not reach heart rate target. With vasodilator administration, no EKG changes. Patient reported chest pain with vasodilator, a non-specific finding. 4. Hypertensive response to exercise. 5. No prior study available for comparison. Electronically signed Dr Hurshel Party, MD 02/06/17 9:35 AM  9/2018Echocardiogram: Mild sclerosis of the trileaflet aortic valve, with adequate leaflet motion. Mild-to-moderate aortic regurgitation is present. The left atrial size is normal. There is normal left ventricular size. Global left ventricular  systolic function is normal. The estimated left ventricular ejection fraction is 60% Normal left ventricular diastolic filling pattern. Normal right ventricle size and function. Normal RVSP ---------------------------------------------------------------- Electronically signed by Ruel Favors W(Interpreting physician) on 01/18/2017 06:58 PM ----------------------------------------------------------------  10/2018Thirty day event monitor: 1. Ventricular arrhythmias occasional PVCs with no couplets triplets or runs of ventricular tachycardia 2. Supraventricular arrhythmias occasional PACs with occasional short runs of nonsustained SVT 3. Atrial fibrillation/atrial flutter none 4. Bradycardia arrhythmias no significant Brady arrhythmias 5. Symptom correlation with arrhythmias patient's symptoms did occur with atrial and ventricular ectopy and short runs of nonsustained SVT  Impressions: Occasional PVCs with no high-grade ventricular ectopy Occasional PACs with occasional brief short runs of nonsustained SVT Symptoms occurred with PACs and PVCs and short runs of nonsustained SVT Overall no hemodynamically significant arrhythmias present  Electronically signed by: Holly Bodily, DO, [facility]    Jan 2022 CTA IMPRESSION: 1. No aortic dissection or acute aortic abnormality. No pulmonary embolus. 2. Minor distal esophageal wall thickening, can be seen with reflux or esophagitis. 3. Improved peripancreatic fat stranding from last month. 4. Moderate colonic stool burden with tortuous sigmoid colon, suggesting constipation.     Assessment and Plan  1. PAF - 2 isolated episodes of afib in setting of severe hypokalemia. No clear recurence, including by a 30 day event monitor by her previous cardiologist. - . With recent afib guidelines changing women with CHADS2Vasc score of 2 to a IIB recommendation,we previously stopped her eliquis.   - recent palpitations, apple watch  detected afib. Strips reviewed, difficult to clearly discern rhythm - plan for 30 day monitor, if afib noted now that she is 64 would be a stronger indication for anticoag.  - EKG today shows NSR  2. Resistant HTN - significnatly improved on aldactone, she was diagnosed with hyperaldo -continue current meds    F/u 2 months    Arnoldo Lenis, M.D.

## 2020-06-18 ENCOUNTER — Telehealth: Payer: Self-pay | Admitting: *Deleted

## 2020-06-18 NOTE — Telephone Encounter (Signed)
   Primary Cardiologist: Carlyle Dolly, MD  Chart reviewed as part of pre-operative protocol coverage. Given lack of CAD, PAD, or CVA history, would be reasonable to hold aspirin 5-7 days prior to her upcoming spinal injection.   I will route this recommendation to the requesting party via Epic fax function and remove from pre-op pool.  Please call with questions.  Abigail Butts, PA-C 06/18/2020, 5:45 PM

## 2020-06-18 NOTE — Telephone Encounter (Signed)
   [facility] Pre-operative Risk Assessment    HEARTCARE STAFF: - Please ensure there is not already an duplicate clearance open for this procedure. - Under Visit Info/Reason for Call, type in Other and utilize the format Clearance MM/DD/YY or Clearance TBD. Do not use dashes or single digits. - If request is for dental extraction, please clarify the # of teeth to be extracted.  Request for surgical clearance:  1. What type of surgery is being performed? Rt C7-T1 Epidural Steroid Injection  2. When is this surgery scheduled? TBD  3. What type of clearance is required (medical clearance vs. Pharmacy clearance to hold med vs. Both)? pharmacy  4. Are there any medications that need to be held prior to surgery and how long? Stop aspirin prior to procedure  5. Practice name and name of physician performing surgery? [facility]/ Dr. Laroy Apple  6. What is the office phone number? [phone] ext [phone]   7.   What is the office fax number? [phone]  8.   Anesthesia type (None, local, MAC, general) ?    Marlou Sa 06/18/2020, 4:02 PM  _________________________________________________________________   (provider comments below)

## 2020-06-21 ENCOUNTER — Encounter (INDEPENDENT_AMBULATORY_CARE_PROVIDER_SITE_OTHER): Payer: Medicare Other

## 2020-06-21 DIAGNOSIS — I480 Paroxysmal atrial fibrillation: Secondary | ICD-10-CM

## 2020-06-23 ENCOUNTER — Other Ambulatory Visit: Payer: Self-pay | Admitting: Gastroenterology

## 2020-06-23 ENCOUNTER — Other Ambulatory Visit: Payer: Self-pay

## 2020-06-23 ENCOUNTER — Ambulatory Visit (HOSPITAL_COMMUNITY)
Admission: RE | Admit: 2020-06-23 | Discharge: 2020-06-23 | Disposition: A | Discharge status: Home / Self Care | Payer: Medicare Other | Source: Ambulatory Visit | Attending: Gastroenterology | Admitting: Gastroenterology

## 2020-06-23 ENCOUNTER — Other Ambulatory Visit (HOSPITAL_COMMUNITY): Payer: Medicare Other

## 2020-06-23 DIAGNOSIS — K859 Acute pancreatitis without necrosis or infection, unspecified: Secondary | ICD-10-CM | POA: Insufficient documentation

## 2020-06-23 IMAGING — MR MR ABDOMEN WO/W CM MRCP
18 of 23 series · 38 of 48 positions shown · IV contrast (8.0 mL GADAVIST)
Comparison: No prior abdominal MRI. CT the chest, abdomen and
pelvis [DATE].

CLINICAL DATA: 64-year-old of persistent female with history
recurrent pancreatitis complaining of abdominal pain for the past
4-5 months with intermittent nausea and vomiting.

EXAM:
MRI ABDOMEN WITHOUT AND WITH CONTRAST (INCLUDING MRCP)
TECHNIQUE: Multiplanar multisequence MR imaging of the abdomen was performed
both before and after the administration of intravenous contrast.
Heavily T2-weighted images of the biliary and pancreatic ducts were
obtained, and three-dimensional MRCP images were rendered by post
processing.
CONTRAST:  8mL GADAVIST GADOBUTROL 1 MMOL/ML IV SOLN

[Series 3: T2 fat-sat · axial · 6.0mm · 1.02mm/px · 1 of 36 slices shown]
[im 1/36]
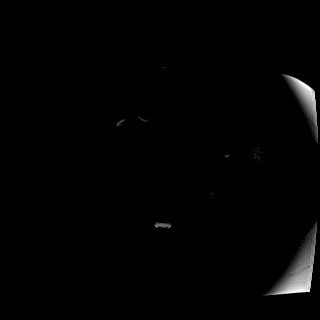

[Series 5: DWI · axial · 6.0mm · 1.36mm/px · 1 of 72 slices shown (1 of 2)]
[im 1/72]
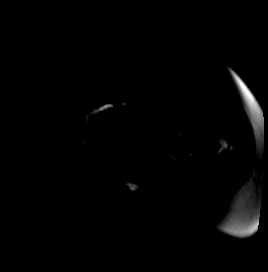

[Series 6: DWI · axial · 6.0mm · 1.36mm/px · 1 of 36 slices shown (2 of 2)]
[im 1/36]
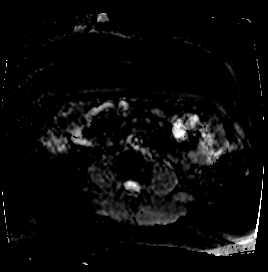

[Series 7: T1 · axial · 3.0mm · 1.02mm/px · z∈[-164,+73]mm · 2 of 80 slices shown (1 of 2)]
[im 1/80]
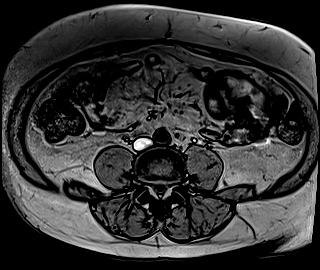
[im 80/80]
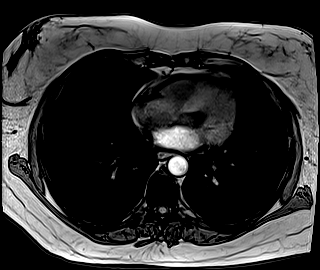

[Series 8: T1 · axial · 3.0mm · 1.02mm/px · z∈[-164,+73]mm · 3 of 80 slices shown (2 of 2)]
[im 1/80]
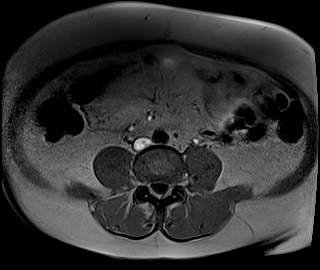
[im 40/80]
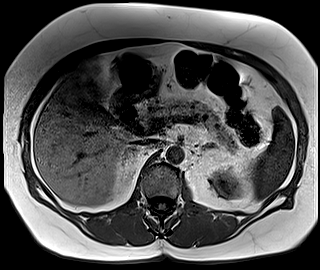
[im 80/80]
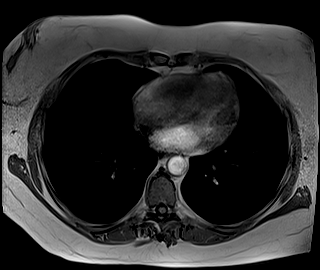

[Series 9: T2 · axial · 6.0mm · 1.27mm/px · 1 of 35 slices shown (1 of 2)]
[im 1/35]
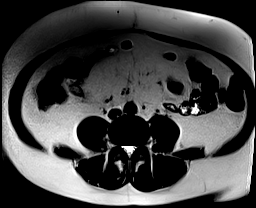

[Series 10: bSSFP · axial · 7.0mm · 1.02mm/px · 1 of 30 slices shown]
[im 1/30]
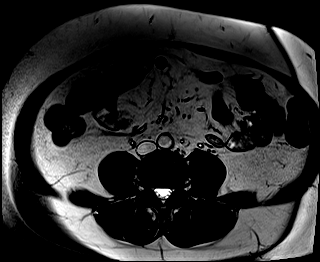

[Series 12: T2 · coronal · 6.0mm · 1.27mm/px · 1 of 30 slices shown (2 of 2)]
[im 1/30]
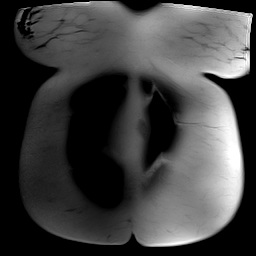

[Series 13: cor obl thk · sagittal · 50.0mm · 0.78mm/px · 1 of 9 slices shown]
[im 1/9]
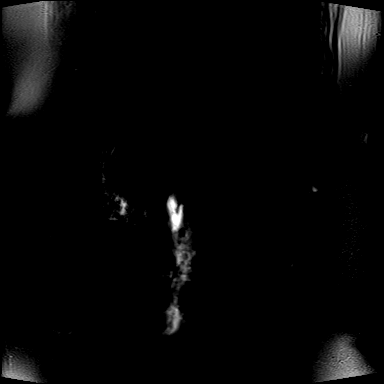

[Series 15: cor_3d_spc_trig · coronal · 1.0mm · 0.42mm/px · 2 of 71 slices shown]
[im 1/71]
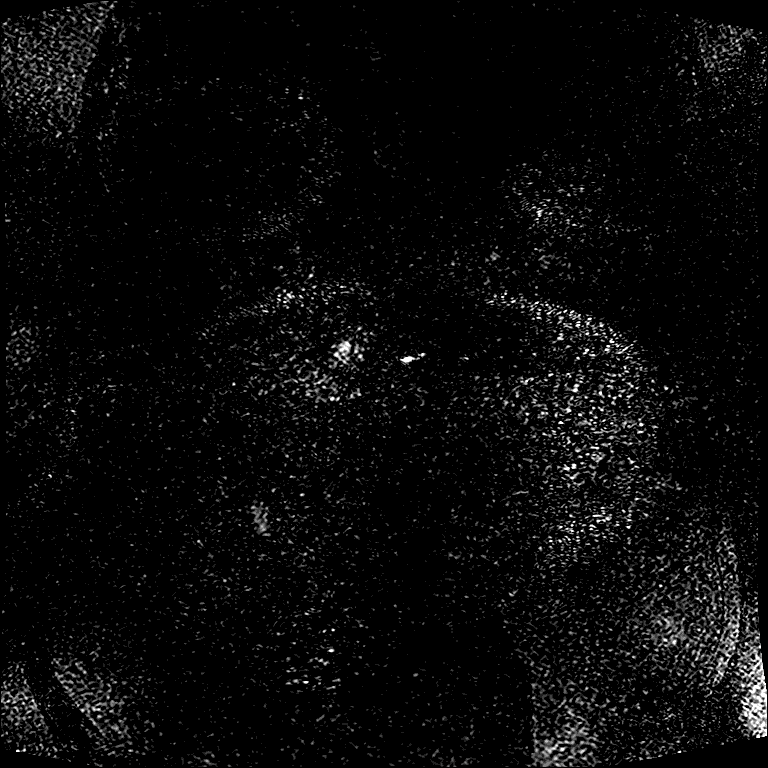
[im 71/71]
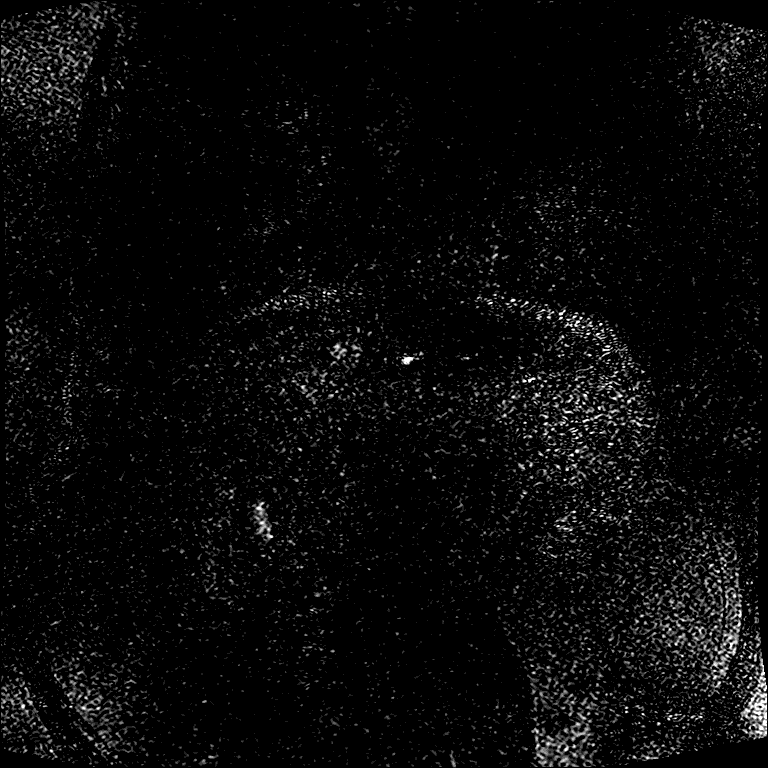

[Series 18: T1 dynamic · axial · 3.0mm · 1.02mm/px · z∈[-173,+88]mm · 3 of 88 slices shown (1 of 8)]
[im 1/88]
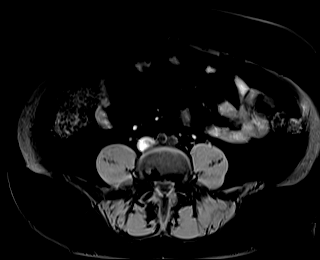
[im 44/88]
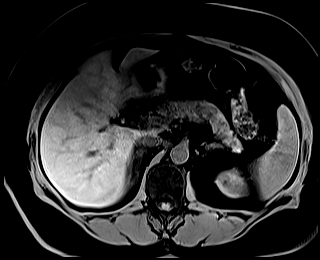
[im 88/88]
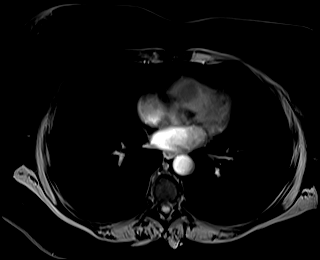

[Series 22: T1 dynamic · axial · 3.0mm · 1.02mm/px · z∈[-173,+88]mm · 3 of 88 slices shown (2 of 8)]
[im 1/88]
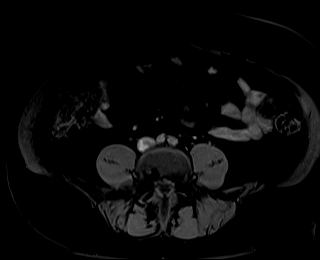
[im 44/88]
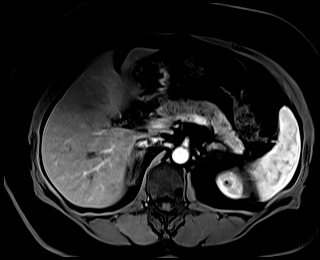
[im 88/88]
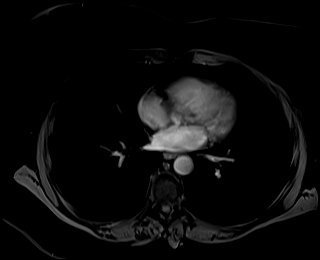

[Series 23: T1 dynamic · axial · 3.0mm · 1.02mm/px · z∈[-173,+88]mm · 3 of 88 slices shown (3 of 8)]
[im 1/88]
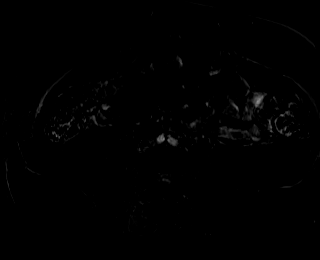
[im 44/88]
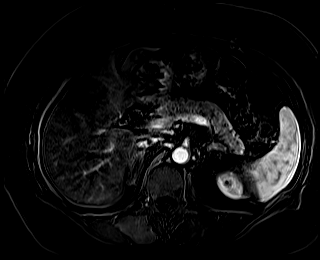
[im 88/88]
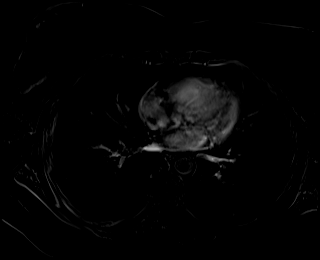

[Series 26: T1 dynamic · axial · 3.0mm · 1.02mm/px · z∈[-173,+88]mm · 3 of 88 slices shown (4 of 8)]
[im 1/88]
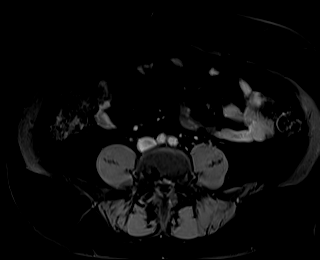
[im 44/88]
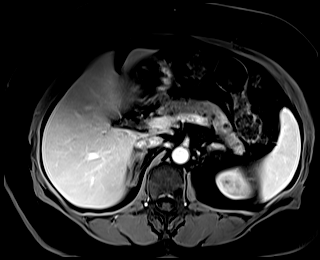
[im 88/88]
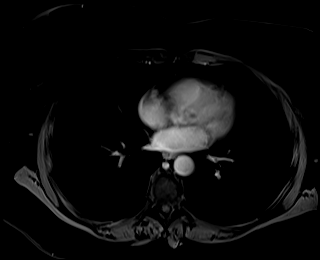

[Series 27: T1 dynamic · axial · 3.0mm · 1.02mm/px · z∈[-173,+88]mm · 3 of 88 slices shown (5 of 8)]
[im 1/88]
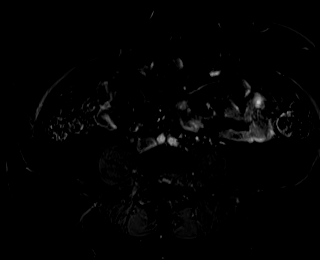
[im 44/88]
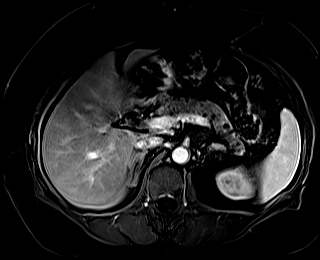
[im 88/88]
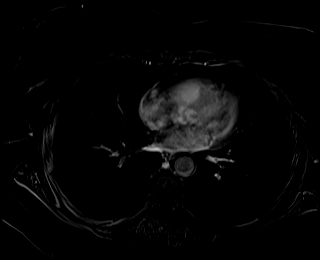

[Series 30: T1 dynamic · axial · 3.0mm · 1.02mm/px · z∈[-173,+88]mm · 3 of 88 slices shown (6 of 8)]
[im 1/88]
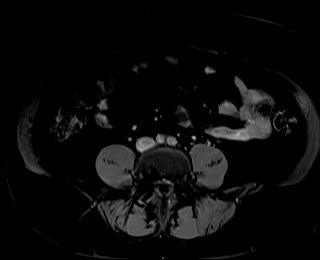
[im 44/88]
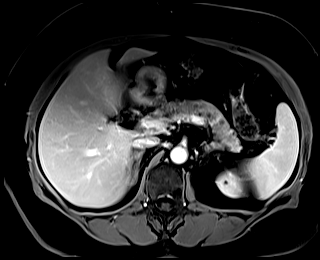
[im 88/88]
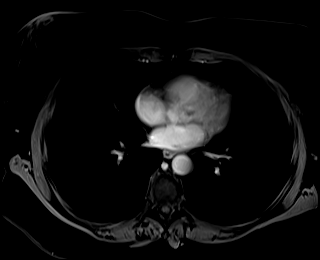

[Series 31: T1 dynamic · axial · 3.0mm · 1.02mm/px · z∈[-173,+88]mm · 3 of 88 slices shown (7 of 8)]
[im 1/88]
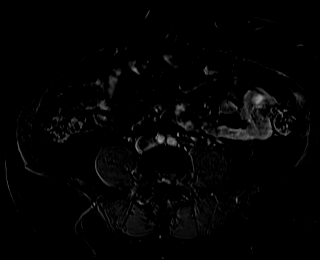
[im 44/88]
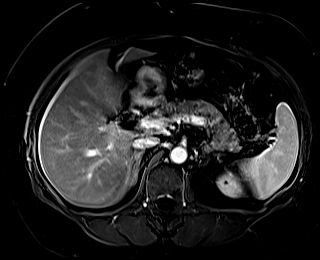
[im 88/88]
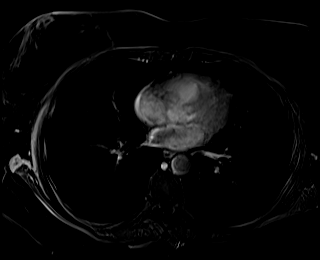

[Series 33: T1 dynamic · coronal · 3.0mm · 1.02mm/px · 3 of 80 slices shown (8 of 8)]
[im 1/80]
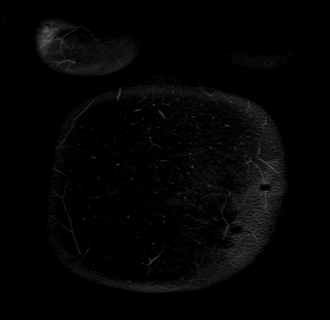
[im 40/80]
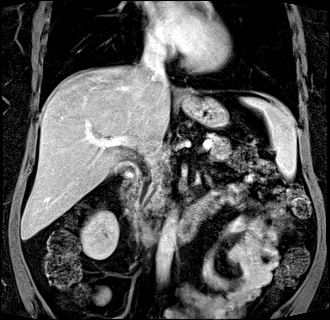
[im 80/80]
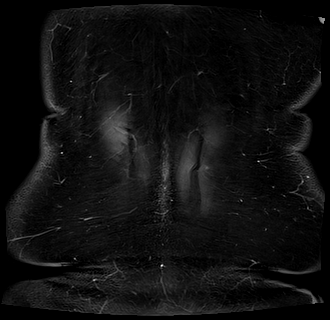

[38 of 48 positions shown; findings below may reference images not displayed]

FINDINGS: Lower chest: Unremarkable.

Hepatobiliary: No suspicious cystic or solid hepatic lesions. No
intra or extrahepatic biliary ductal dilatation noted on MRCP
images. Common bile duct measures 6 mm in the porta hepatis. No
filling defect within the common bile duct to suggest
choledocholithiasis. Gallbladder is normal in appearance.

Pancreas: In the inferior aspect of the pancreatic head (axial image
59 of series 26 and coronal image 35 of series 33) there is a poorly
defined mass measuring 3.4 x 2.2 x 2.4 cm which is slightly
hypointense to normal pancreatic parenchyma on T1 weighted images,
very slightly T2 hyperintense, demonstrates variable and
heterogeneous internal areas of enhancement, and demonstrates some
mild diffusion restriction, highly concerning for primary pancreatic
neoplasm. This causes mass effect upon the distal common bile duct
and distal pancreatic duct, both of which appear narrowed on MRCP
images (coronal MRCP image 29 of series 11). No associated proximal
pancreatic ductal dilatation, however. Multiple other subcentimeter
T1 hypointense, T2 hyperintense, nonenhancing lesions scattered
throughout the pancreatic parenchyma, favored to represent benign
lesions such as small side branch IPMNs (intraductal papillary
mucinous neoplasm).

Spleen:  Unremarkable.

Adrenals/Urinary Tract: Bilateral kidneys and bilateral adrenal
glands are normal in appearance. No hydroureteronephrosis in the
visualized portions of the abdomen.

Stomach/Bowel: Visualized portions are unremarkable.

Vascular/Lymphatic: No aneurysm identified in the visualized
abdominal vasculature. No lymphadenopathy noted in the abdomen.

Other: No significant volume of ascites noted in the visualized
portions of the peritoneal cavity.

Musculoskeletal: No aggressive appearing osseous lesions are noted
in the visualized portions of the skeleton.
IMPRESSION: 1. Findings are highly concerning for primary pancreatic neoplasm in
the inferior aspect of the pancreatic head measuring approximately
3.4 x 2.2 x 2.4 cm. This exerts mild mass effect upon the adjacent
distal pancreatic duct and distal common bile duct just proximal to
the ampulla, without frank ductal obstruction at this time. The
possibility of focal pancreatitis is not excluded, however, further
evaluation with endoscopic ultrasound is recommended in the near
future to better evaluate this region and assess need for potential
biopsy.
2. Multiple tiny subcentimeter cystic appearing lesions scattered
elsewhere throughout the pancreas, favored to represent small benign
lesions such as side-branch IPMN and/or small pancreatic
pseudocysts. Repeat abdominal MRI with and without IV gadolinium
with MRCP is recommended in 1 year to ensure stability of these
lesions. This recommendation follows ACR consensus guidelines:
Management of Incidental Pancreatic Cysts: A White Paper of the ACR
Incidental Findings Committee. [HOSPITAL] [3A];[DATE].

## 2020-06-23 MED ORDER — GADOBUTROL 1 MMOL/ML IV SOLN
8.0000 mL | Freq: Once | INTRAVENOUS | Status: AC | PRN
  Administered 2020-06-23: 8 mL via INTRAVENOUS

## 2020-06-25 ENCOUNTER — Encounter (HOSPITAL_COMMUNITY): Payer: Self-pay | Admitting: Gastroenterology

## 2020-06-25 ENCOUNTER — Other Ambulatory Visit: Payer: Self-pay

## 2020-06-25 ENCOUNTER — Other Ambulatory Visit (HOSPITAL_COMMUNITY)
Admission: RE | Admit: 2020-06-25 | Discharge: 2020-06-25 | Disposition: A | Discharge status: Home / Self Care | Payer: Medicare Other | Source: Ambulatory Visit | Attending: Gastroenterology | Admitting: Gastroenterology

## 2020-06-25 DIAGNOSIS — Z01812 Encounter for preprocedural laboratory examination: Secondary | ICD-10-CM | POA: Diagnosis present

## 2020-06-25 DIAGNOSIS — Z20822 Contact with and (suspected) exposure to covid-19: Secondary | ICD-10-CM | POA: Diagnosis not present

## 2020-06-25 DIAGNOSIS — K8689 Other specified diseases of pancreas: Secondary | ICD-10-CM

## 2020-06-25 LAB — SARS CORONAVIRUS 2 (TAT 6-24 HRS): SARS Coronavirus 2: NEGATIVE

## 2020-06-25 NOTE — Progress Notes (Signed)
Attempted to obtain medical history via telephone, unable to reach at this time. I left a voicemail to return pre surgical testing department's phone call.  

## 2020-06-28 ENCOUNTER — Ambulatory Visit (HOSPITAL_COMMUNITY): Payer: Medicare Other | Admitting: Anesthesiology

## 2020-06-28 ENCOUNTER — Ambulatory Visit (HOSPITAL_COMMUNITY)
Admission: RE | Admit: 2020-06-28 | Discharge: 2020-06-28 | Disposition: A | Discharge status: Home / Self Care | Payer: Medicare Other | Attending: Gastroenterology | Admitting: Gastroenterology

## 2020-06-28 ENCOUNTER — Encounter (HOSPITAL_COMMUNITY): Payer: Self-pay | Admitting: Gastroenterology

## 2020-06-28 ENCOUNTER — Other Ambulatory Visit: Payer: Self-pay

## 2020-06-28 ENCOUNTER — Encounter (HOSPITAL_COMMUNITY)
Admission: RE | Disposition: A | Discharge status: Home / Self Care | Payer: Self-pay | Source: Home / Self Care | Attending: Gastroenterology

## 2020-06-28 DIAGNOSIS — K269 Duodenal ulcer, unspecified as acute or chronic, without hemorrhage or perforation: Secondary | ICD-10-CM

## 2020-06-28 DIAGNOSIS — K3189 Other diseases of stomach and duodenum: Secondary | ICD-10-CM | POA: Diagnosis not present

## 2020-06-28 DIAGNOSIS — K2950 Unspecified chronic gastritis without bleeding: Secondary | ICD-10-CM | POA: Insufficient documentation

## 2020-06-28 DIAGNOSIS — K209 Esophagitis, unspecified without bleeding: Secondary | ICD-10-CM

## 2020-06-28 DIAGNOSIS — F17210 Nicotine dependence, cigarettes, uncomplicated: Secondary | ICD-10-CM | POA: Diagnosis not present

## 2020-06-28 DIAGNOSIS — K222 Esophageal obstruction: Secondary | ICD-10-CM | POA: Diagnosis not present

## 2020-06-28 DIAGNOSIS — L4050 Arthropathic psoriasis, unspecified: Secondary | ICD-10-CM | POA: Insufficient documentation

## 2020-06-28 DIAGNOSIS — K8689 Other specified diseases of pancreas: Secondary | ICD-10-CM

## 2020-06-28 DIAGNOSIS — Z79890 Hormone replacement therapy (postmenopausal): Secondary | ICD-10-CM | POA: Insufficient documentation

## 2020-06-28 DIAGNOSIS — K219 Gastro-esophageal reflux disease without esophagitis: Secondary | ICD-10-CM | POA: Diagnosis not present

## 2020-06-28 DIAGNOSIS — Z800 Family history of malignant neoplasm of digestive organs: Secondary | ICD-10-CM | POA: Insufficient documentation

## 2020-06-28 DIAGNOSIS — Z8719 Personal history of other diseases of the digestive system: Secondary | ICD-10-CM | POA: Diagnosis not present

## 2020-06-28 DIAGNOSIS — K449 Diaphragmatic hernia without obstruction or gangrene: Secondary | ICD-10-CM | POA: Insufficient documentation

## 2020-06-28 DIAGNOSIS — Z7982 Long term (current) use of aspirin: Secondary | ICD-10-CM | POA: Insufficient documentation

## 2020-06-28 DIAGNOSIS — K862 Cyst of pancreas: Secondary | ICD-10-CM

## 2020-06-28 DIAGNOSIS — K5909 Other constipation: Secondary | ICD-10-CM | POA: Insufficient documentation

## 2020-06-28 DIAGNOSIS — D132 Benign neoplasm of duodenum: Secondary | ICD-10-CM | POA: Insufficient documentation

## 2020-06-28 DIAGNOSIS — Z79899 Other long term (current) drug therapy: Secondary | ICD-10-CM | POA: Diagnosis not present

## 2020-06-28 HISTORY — DX: Nausea with vomiting, unspecified: R11.2

## 2020-06-28 HISTORY — DX: Prediabetes: R73.03

## 2020-06-28 HISTORY — PX: FINE NEEDLE ASPIRATION: SHX5430

## 2020-06-28 HISTORY — PX: EUS: SHX5427

## 2020-06-28 HISTORY — PX: ESOPHAGOGASTRODUODENOSCOPY (EGD) WITH PROPOFOL: SHX5813

## 2020-06-28 HISTORY — PX: BIOPSY: SHX5522

## 2020-06-28 HISTORY — DX: Other specified postprocedural states: Z98.890

## 2020-06-28 HISTORY — PX: POLYPECTOMY: SHX5525

## 2020-06-28 HISTORY — DX: Other specified postprocedural states: R11.2

## 2020-06-28 LAB — COMPREHENSIVE METABOLIC PANEL
ALT: 33 U/L (ref 0–44)
AST: 30 U/L (ref 15–41)
Albumin: 4.3 g/dL (ref 3.5–5.0)
Alkaline Phosphatase: 55 U/L (ref 38–126)
Anion gap: 11 (ref 5–15)
BUN: 30 mg/dL — ABNORMAL HIGH (ref 8–23)
CO2: 28 mmol/L (ref 22–32)
Calcium: 9.9 mg/dL (ref 8.9–10.3)
Chloride: 99 mmol/L (ref 98–111)
Creatinine, Ser: 1.32 mg/dL — ABNORMAL HIGH (ref 0.44–1.00)
GFR, Estimated: 45 mL/min — ABNORMAL LOW (ref >60)
Glucose, Bld: 109 mg/dL — ABNORMAL HIGH (ref 70–99)
Potassium: 5.5 mmol/L — ABNORMAL HIGH (ref 3.5–5.1)
Sodium: 138 mmol/L (ref 135–145)
Total Bilirubin: 0.7 mg/dL (ref 0.3–1.2)
Total Protein: 7.6 g/dL (ref 6.5–8.1)

## 2020-06-28 SURGERY — ESOPHAGOGASTRODUODENOSCOPY (EGD) WITH PROPOFOL
Anesthesia: Monitor Anesthesia Care

## 2020-06-28 MED ORDER — LIDOCAINE 2% (20 MG/ML) 5 ML SYRINGE
INTRAMUSCULAR | Status: DC | PRN
  Administered 2020-06-28: 40 mg via INTRAVENOUS
  Administered 2020-06-28: 60 mg via INTRAVENOUS

## 2020-06-28 MED ORDER — SODIUM CHLORIDE 0.9 % IV SOLN: INTRAVENOUS | Status: DC

## 2020-06-28 MED ORDER — CIPROFLOXACIN IN D5W 400 MG/200ML IV SOLN
INTRAVENOUS | Status: AC
  Filled 2020-06-28: qty 200

## 2020-06-28 MED ORDER — PROPOFOL 10 MG/ML IV BOLUS
INTRAVENOUS | Status: DC | PRN
  Administered 2020-06-28: 30 mg via INTRAVENOUS
  Administered 2020-06-28: 20 mg via INTRAVENOUS

## 2020-06-28 MED ORDER — CIPROFLOXACIN HCL 500 MG PO TABS: 500.0000 mg | ORAL_TABLET | Freq: Two times a day (BID) | ORAL | 0 refills | Status: AC

## 2020-06-28 MED ORDER — PROPOFOL 500 MG/50ML IV EMUL
INTRAVENOUS | Status: AC
  Filled 2020-06-28: qty 50

## 2020-06-28 MED ORDER — ONDANSETRON HCL 4 MG/2ML IJ SOLN
INTRAMUSCULAR | Status: DC | PRN
  Administered 2020-06-28: 4 mg via INTRAVENOUS

## 2020-06-28 MED ORDER — PROPOFOL 10 MG/ML IV BOLUS
INTRAVENOUS | Status: AC
  Filled 2020-06-28: qty 20

## 2020-06-28 MED ORDER — PROPOFOL 500 MG/50ML IV EMUL
INTRAVENOUS | Status: DC | PRN
  Administered 2020-06-28: 150 ug/kg/min via INTRAVENOUS

## 2020-06-28 MED ORDER — CIPROFLOXACIN IN D5W 400 MG/200ML IV SOLN
400.0000 mg | Freq: Once | INTRAVENOUS | Status: AC
  Administered 2020-06-28: 400 mg via INTRAVENOUS

## 2020-06-28 MED ORDER — OMEPRAZOLE 40 MG PO CPDR: 40.0000 mg | DELAYED_RELEASE_CAPSULE | Freq: Two times a day (BID) | ORAL | 5 refills | Status: DC

## 2020-06-28 MED ORDER — ALBUTEROL SULFATE (2.5 MG/3ML) 0.083% IN NEBU
INHALATION_SOLUTION | RESPIRATORY_TRACT | Status: AC
  Filled 2020-06-28: qty 3

## 2020-06-28 MED ORDER — ALBUTEROL SULFATE (2.5 MG/3ML) 0.083% IN NEBU
2.5000 mg | INHALATION_SOLUTION | RESPIRATORY_TRACT | Status: AC
  Administered 2020-06-28: 2.5 mg via RESPIRATORY_TRACT

## 2020-06-28 MED ORDER — LACTATED RINGERS IV SOLN: INTRAVENOUS | Status: DC

## 2020-06-28 SURGICAL SUPPLY — 14 items

## 2020-06-28 NOTE — Op Note (Signed)
[facility] Patient Name: Stephanie Ewing Procedure Date: 06/28/2020 MRN: [id number] Attending MD: Justice Britain , MD Date of Birth: November 24, 1955 CSN: [id number] Age: 65 Admit Type: Inpatient Procedure:                Upper EUS Indications:              Pancreatic cyst on MRCP, Suspected mass in pancreas                            on MRCP Providers:                Justice Britain, MD, Cleda Daub, RN, Ladona Ridgel, Technician, [address]. [name] CRNA, CRNA Referring MD:             [name]. Havery Moros, MD, Ashish C. Manuella Ghazi MD, MD Medicines:                Monitored Anesthesia Care, Cipro 841 mg IV Complications:            No immediate complications. Estimated Blood Loss:     Estimated blood loss was minimal. Procedure:                Pre-Anesthesia Assessment:                           - Prior to the procedure, a History and Physical                            was performed, and patient medications and                            allergies were reviewed. The patient's tolerance of                            previous anesthesia was also reviewed. The risks                            and benefits of the procedure and the sedation                            options and risks were discussed with the patient.                            All questions were answered, and informed consent                            was obtained. Prior Anticoagulants: The patient has                            taken no previous anticoagulant or antiplatelet                            agents except for aspirin. ASA Grade Assessment: II                            -  A patient with mild systemic disease. After                            reviewing the risks and benefits, the patient was                            deemed in satisfactory condition to undergo the                            procedure.                           After obtaining informed consent, the  endoscope was                            passed under direct vision. Throughout the                            procedure, the patient's blood pressure, pulse, and                            oxygen saturations were monitored continuously. The                            GIF-H190 ([id number]) Olympus gastroscope was                            introduced through the mouth, and advanced to the                            second part of duodenum. The Olympus [id number]-[id number]                            [id number]) was introduced through the mouth, and                            advanced to the second part of duodenum. The                            [id number] ([id number]) Olympus Linear EUS was                            introduced through the mouth, and advanced to the                            duodenum for ultrasound examination from the                            stomach and duodenum. After obtaining informed                            consent, the endoscope was passed under direct  vision. Throughout the procedure, the patient's                            blood pressure, pulse, and oxygen saturations were                            monitored continuously.The upper EUS was                            accomplished without difficulty. The patient                            tolerated the procedure. Scope In: Scope Out: Findings:      ENDOSCOPIC FINDING: :      No gross lesions were noted in the proximal esophagus and in the mid       esophagus.      LA Grade B (one or more mucosal breaks greater than 5 mm, not extending       between the tops of two mucosal folds) esophagitis with no bleeding was       found in the distal esophagus.      Schatzki ring noted.      Hiatal hernia of 2 cm noted.      Striped mildly erythematous mucosa without bleeding was found in the       gastric body, at the incisura and in the gastric antrum.      No other gross lesions were noted in the entire  examined stomach.       Biopsies were taken with a cold forceps for histology and Helicobacter       pylori testing.      Duodenal polyp in D2 noted. This was removed with cold snare polypectomy.      No gross lesions were noted in the duodenal bulb, in the first portion       of the duodenum and in the second portion of the duodenum. Biopsies were       taken with a cold forceps for histology.      The major papilla was normal.      ENDOSONOGRAPHIC FINDING: :      An irregular heterogenous region/lesion was identified in the pancreatic       head in the region of the distal CBD and the PD of the head. The area       measured up to 30 mm by 23 mm in maximal cross-sectional diameter. The       margins were irregular. An intact interface was seen between the area       and the superior mesenteric artery, celiac trunk and portal vein       suggesting a lack of invasion. The remainder of the pancreas was       examined. The pancreas was slightly atrophic, however, the       endosonographic appearance of parenchyma and the upstream pancreatic       duct indicated no duct dilation. Due to the findings on MRI and       undetermined cause of pancreatities, fine needle biopsy was performed.       Color Doppler imaging was utilized prior to needle puncture to confirm a       lack of significant vascular structures within the needle path. Seven  passes were made with the Acquire 22 gauge ultrasound core biopsy needle       using a transduodenal approach. Visible cores of tissue were obtained.       Final cytology results are pending.      Anechoic lesions suggestive of multiple cysts were identified in the       pancreatic head, pancreatic body and pancreatic tail. The first cyst       measured 4 mm by 4 mm. The second cyst measured 6 mm by 7 mm. The third       cyst measured 4 mm by 4 mm. The fourth cyst measured 5 mm by 7 mm. There       was no associated mass.      There was no sign of  significant endosonographic abnormality in the       common bile duct (4.9 mm) and in the common hepatic duct (7.6 mm). An       unremarkable gallbladder was identified without cholelithiasis was noted.      Endosonographic imaging in the visualized portion of the liver showed no       mass-lesion.      No malignant-appearing lymph nodes were visualized in the celiac region       (level 20), peripancreatic region and porta hepatis region.      The celiac region was visualized. Impression:               EGD Impression:                           - No gross lesions in esophagus proximally. LA                            Grade B esophagitis with no bleeding distally.                           - Shatzki ring present.                           - Hiatal hernia noted.                           - Erythematous mucosa in the gastric body, incisura                            and antrum. No other gross lesions in the stomach.                            Biopsied.                           - Duodenal polyp in D2 noted. This was removed with                            cold snare polypectomy.                           - No gross lesions in the duodenal bulb, in the  first portion of the duodenum and in the second                            portion of the duodenum. Biopsied.                           - Normal major papilla.                           EUS Impression:                           - A heterogenous region/lesion was identified in                            the pancreatic head. However, the endosonographic                            appearance is not typical for adenocarcinoma. This                            could be inflammatory in nature. But with MRI                            findings and unexplained pancreatitis, FNB was                            performed. If malignancy is identified, this will                            be staged T2 N0 Mx by endosonographic  criteria.                           - Multiple cystic lesions were seen in the                            pancreatic head, pancreatic body and pancreatic                            tail. Tissue has not been obtained. 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                           - There was no sign of significant pathology in the                            common bile duct and in the common hepatic duct.                           -  No malignant-appearing lymph nodes were                            visualized in the celiac region (level 20),                            peripancreatic region and porta hepatis region. Moderate Sedation:      Not Applicable - Patient had care per Anesthesia. Recommendation:           - The patient will be observed post-procedure,                            until all discharge criteria are met.                           - Discharge patient to home.                           - Patient has a contact number available for                            emergencies. The signs and symptoms of potential                            delayed complications were discussed with the                            patient. Return to normal activities tomorrow.                            Written discharge instructions were provided to the                            patient.                           - Low fat diet for 1 week.                           - Observe patient's clinical course.                           - Await cytology results and await path results.                           - Start Omeprazole 40 mg twice daily.                           - Ciprofloxacin 500 mg twice daily for 3-days to                            decrease risk of post-infectious complications.                           - If malignancy is not detected, I think repeat  imaging and possible repeat EUS will be recommended                            as will CA19-9 blood tests. We will see what HFP                            returns of from today.                           - Repeat EGD in 31-month to check on healing of                            esophagitis.                           - The findings and recommendations were discussed                            with the patient.                           - The findings and recommendations were discussed                            with the patient's family. Procedure Code(s):        --- Professional ---                           [id number] Esophagogastroduodenoscopy, flexible,                            transoral; with transendoscopic ultrasound-guided                            intramural or transmural fine needle                            aspiration/biopsy(s), (includes endoscopic                            ultrasound examination limited to the esophagus,                            stomach or duodenum, and adjacent structures) Diagnosis Code(s):        --- Professional ---                           K20.90, Esophagitis, unspecified without bleeding                           K31.89, Other diseases of stomach and duodenum                           K86.89, Other specified diseases of pancreas  K86.2, Cyst of pancreas                           I89.9, Noninfective disorder of lymphatic vessels                            and lymph nodes, unspecified                           R93.3, Abnormal findings on diagnostic imaging of                            other parts of digestive tract CPT copyright 2019 [employer]. All rights reserved. The codes documented in this report are preliminary and upon coder review may  be revised to meet current compliance requirements. Justice Britain, MD 06/28/2020 4:54:53 PM Number of Addenda: 0

## 2020-06-28 NOTE — Anesthesia Preprocedure Evaluation (Addendum)
Anesthesia Evaluation  Patient identified by MRN, date of birth, ID band Patient awake    Reviewed: Allergy & Precautions, NPO status , Patient's Chart, lab work & pertinent test results  History of Anesthesia Complications (+) PONV and history of anesthetic complications  Airway Mallampati: II  TM Distance: >3 FB Neck ROM: Full    Dental no notable dental hx. (+) Dental Advisory Given   Pulmonary Current SmokerPatient did not abstain from smoking.,     + wheezing      Cardiovascular hypertension, Pt. on medications Normal cardiovascular exam+ dysrhythmias Atrial Fibrillation + Valvular Problems/Murmurs AI   IMPRESSIONS    1. Left ventricular ejection fraction, by estimation, is 60 to 65%. The  left ventricle has normal function. The left ventricle has no regional  wall motion abnormalities. Left ventricular diastolic parameters were  normal.  2. Right ventricular systolic function is normal. The right ventricular  size is normal. There is normal pulmonary artery systolic pressure.  3. The mitral valve is normal in structure. Trivial mitral valve  regurgitation. No evidence of mitral stenosis.  4. The aortic valve is tricuspid. Aortic valve regurgitation is moderate.  5. The inferior vena cava is normal in size with greater than 50%  respiratory variability, suggesting right atrial pressure of 3 mmHg.    Neuro/Psych PSYCHIATRIC DISORDERS Depression negative neurological ROS     GI/Hepatic Neg liver ROS, Pancreatic mass    Endo/Other  Hypothyroidism Hyperaldosterone  Renal/GU negative Renal ROS     Musculoskeletal negative musculoskeletal ROS (+)   Abdominal   Peds  Hematology negative hematology ROS (+)   Anesthesia Other Findings   Reproductive/Obstetrics                            Anesthesia Physical Anesthesia Plan  ASA: III  Anesthesia Plan: MAC   Post-op Pain  Management:    Induction: Intravenous  PONV Risk Score and Plan: 2 and Ondansetron and Propofol infusion  Airway Management Planned: Natural Airway  Additional Equipment:   Intra-op Plan:   Post-operative Plan:   Informed Consent: I have reviewed the patients History and Physical, chart, labs and discussed the procedure including the risks, benefits and alternatives for the proposed anesthesia with the patient or authorized representative who has indicated his/her understanding and acceptance.     Dental advisory given  Plan Discussed with: Anesthesiologist and CRNA  Anesthesia Plan Comments:        Anesthesia Quick Evaluation

## 2020-06-28 NOTE — Interval H&P Note (Signed)
History and Physical Interval Note:  06/28/2020 2:59 PM  Stephanie Ewing  has presented today for surgery, with the diagnosis of panc mass.  The various methods of treatment have been discussed with the patient and family. After consideration of risks, benefits and other options for treatment, the patient has consented to  Procedure(s): ESOPHAGOGASTRODUODENOSCOPY (EGD) WITH PROPOFOL (N/A) UPPER ENDOSCOPIC ULTRASOUND (EUS) LINEAR (N/A) as a surgical intervention.  The patient's history has been reviewed, patient examined, no change in status, stable for surgery.  I have reviewed the patient's chart and labs.  Questions were answered to the patient's satisfaction.    The risks of an EUS including intestinal perforation, bleeding, infection, aspiration, and medication effects were discussed as was the possibility it may not give a definitive diagnosis if a biopsy is performed.  When a biopsy of the pancreas is done as part of the EUS, there is an additional risk of pancreatitis at the rate of about 1-2%.  It was explained that procedure related pancreatitis is typically mild, although it can be severe and even life threatening, which is why we do not perform random pancreatic biopsies and only biopsy a lesion/area we feel is concerning enough to warrant the risk.    [employer]

## 2020-06-28 NOTE — Discharge Instructions (Signed)
YOU HAD AN ENDOSCOPIC PROCEDURE TODAY: Refer to the procedure report and other information in the discharge instructions given to you for any specific questions about what was found during the examination. If this information does not answer your questions, please call [place] office at [phone] to clarify.  ° °YOU SHOULD EXPECT: Some feelings of bloating in the abdomen. Passage of more gas than usual. Walking can help get rid of the air that was put into your GI tract during the procedure and reduce the bloating. If you had a lower endoscopy (such as a colonoscopy or flexible sigmoidoscopy) you may notice spotting of blood in your stool or on the toilet paper. Some abdominal soreness may be present for a day or two, also. ° °DIET: Your first meal following the procedure should be a light meal and then it is ok to progress to your normal diet. A half-sandwich or bowl of soup is an example of a good first meal. Heavy or fried foods are harder to digest and may make you feel nauseous or bloated. Drink plenty of fluids but you should avoid alcoholic beverages for 24 hours. If you had a esophageal dilation, please see attached instructions for diet.   ° °ACTIVITY: Your care partner should take you home directly after the procedure. You should plan to take it easy, moving slowly for the rest of the day. You can resume normal activity the day after the procedure however YOU SHOULD NOT DRIVE, use power tools, machinery or perform tasks that involve climbing or major physical exertion for 24 hours (because of the sedation medicines used during the test).  ° °SYMPTOMS TO REPORT IMMEDIATELY: °A gastroenterologist can be reached at any hour. Please call [phone]  for any of the following symptoms:  °Following lower endoscopy (colonoscopy, flexible sigmoidoscopy) °Excessive amounts of blood in the stool  °Significant tenderness, worsening of abdominal pains  °Swelling of the abdomen that is new, acute  °Fever of 100° or  higher  °Following upper endoscopy (EGD, EUS, ERCP, esophageal dilation) °Vomiting of blood or coffee ground material  °New, significant abdominal pain  °New, significant chest pain or pain under the shoulder blades  °Painful or persistently difficult swallowing  °New shortness of breath  °Black, tarry-looking or red, bloody stools ° °FOLLOW UP:  °If any biopsies were taken you will be contacted by phone or by letter within the next 1-3 weeks. Call [phone]  if you have not heard about the biopsies in 3 weeks.  °Please also call with any specific questions about appointments or follow up tests. ° °

## 2020-06-28 NOTE — Anesthesia Procedure Notes (Signed)
Procedure Name: MAC Date/Time: 06/28/2020 3:14 PM Performed by: Lollie Sails, CRNA Pre-anesthesia Checklist: Patient identified, Emergency Drugs available, Suction available, Patient being monitored and Timeout performed Oxygen Delivery Method: Simple face mask Preoxygenation: POM used.

## 2020-06-28 NOTE — Transfer of Care (Signed)
Immediate Anesthesia Transfer of Care Note  Patient: Stephanie Ewing  Procedure(s) Performed: ESOPHAGOGASTRODUODENOSCOPY (EGD) WITH PROPOFOL (N/A ) UPPER ENDOSCOPIC ULTRASOUND (EUS) LINEAR (N/A ) BIOPSY POLYPECTOMY FINE NEEDLE ASPIRATION (FNA) LINEAR  Patient Location: PACU and Endoscopy Unit  Anesthesia Type:MAC  Level of Consciousness: awake, alert  and oriented  Airway & Oxygen Therapy: Patient Spontanous Breathing and Patient connected to face mask oxygen  Post-op Assessment: Report given to RN and Post -op Vital signs reviewed and stable  Post vital signs: Reviewed and stable  Last Vitals:  Vitals Value Taken Time  BP    Temp    Pulse    Resp    SpO2      Last Pain:  Vitals:   06/28/20 1238  TempSrc: Oral  PainSc: 7          Complications: No complications documented.

## 2020-06-29 ENCOUNTER — Encounter (HOSPITAL_COMMUNITY): Payer: Self-pay | Admitting: Gastroenterology

## 2020-06-29 LAB — SURGICAL PATHOLOGY

## 2020-06-30 ENCOUNTER — Encounter: Payer: Self-pay | Admitting: Gastroenterology

## 2020-06-30 LAB — CYTOLOGY - NON PAP

## 2020-06-30 NOTE — Anesthesia Postprocedure Evaluation (Signed)
Anesthesia Post Note  Patient: Stephanie Ewing  Procedure(s) Performed: ESOPHAGOGASTRODUODENOSCOPY (EGD) WITH PROPOFOL (N/A ) UPPER ENDOSCOPIC ULTRASOUND (EUS) LINEAR (N/A ) BIOPSY POLYPECTOMY FINE NEEDLE ASPIRATION (FNA) LINEAR     Anesthesia Post Evaluation No complications documented.  Last Vitals:  Vitals:   06/28/20 1640 06/28/20 1650  BP: (!) 160/74 (!) 168/68  Pulse: 66 60  Resp: 13 13  Temp:    SpO2: 100% 100%    Last Pain:  Vitals:   06/28/20 1650  TempSrc:   PainSc: 7                  Laporchia Nakajima DANIEL

## 2020-07-02 ENCOUNTER — Telehealth: Payer: Self-pay | Admitting: Gastroenterology

## 2020-07-02 ENCOUNTER — Other Ambulatory Visit: Payer: Self-pay

## 2020-07-02 DIAGNOSIS — K8689 Other specified diseases of pancreas: Secondary | ICD-10-CM

## 2020-07-02 NOTE — Telephone Encounter (Signed)
Pt has questions about the pancreas biopsies. Pls call her.

## 2020-07-02 NOTE — Telephone Encounter (Signed)
The pt has been advised that she will be contacted with results as soon as available. The pt has been advised of the information and verbalized understanding.

## 2020-07-02 NOTE — Telephone Encounter (Signed)
The pt has been advised and the letter sent to her in a My Chart message   [facility] [place], [place]  [address] Phone:  [phone]   Fax:  [phone]  06/30/2020 MRN: [id number]   Stephanie Ewing [address] [place] [address]  Dear Ms. Riepe,  I am writing to inform you that the biopsies taken during your recent upper ultrasound endoscopic examination showed:   FINAL MICROSCOPIC DIAGNOSIS:  A. STOMACH, BIOPSY:  - Mild chronic gastritis.  - Warthin-Starry stain is negative for Helicobacter pylori.  B. DUODENUM, BIOPSY:  - No significant histopathologic findings.  - Negative for increased intraepithelial lymphocytes and villous  architectural changes.  C. DUODENUM, POLYPECTOMY:  - Adenoma.  - Negative for high grade dysplasia.   What does this all mean? The stomach biopsies we obtained showed evidence of chronic inflammation known as gastritis.  No evidence of precancerous changes or H. pylori infection was noted. The small intestine biopsies we obtained showed no evidence of celiac disease or precancerous changes. 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  This is a precancerous polyp meaning that in time had it not been removed it had the chance to develop into cancer.  It has been removed.  This will need a follow-up at 1 year via endoscopy evaluation.  The cytology taken during the exam showed   Please call us at Dept: [phone] if you have persistent problems or have questions about your condition that have not been fully answered at this time.  Sincerely,  Irving Copas., MD

## 2020-07-02 NOTE — Telephone Encounter (Signed)
See phone note 2/18 response .

## 2020-07-02 NOTE — Telephone Encounter (Signed)
Inbound call from patient requesting a call back in regards to her results please.

## 2020-07-07 ENCOUNTER — Other Ambulatory Visit: Payer: Self-pay | Admitting: Neurosurgery

## 2020-07-07 ENCOUNTER — Ambulatory Visit: Payer: Medicare Other | Admitting: Gastroenterology

## 2020-07-08 ENCOUNTER — Encounter: Payer: Self-pay | Admitting: Cardiology

## 2020-07-08 ENCOUNTER — Telehealth: Payer: Self-pay | Admitting: Cardiology

## 2020-07-08 NOTE — Telephone Encounter (Signed)
   [facility] Pre-operative Risk Assessment    HEARTCARE STAFF: - Please ensure there is not already an duplicate clearance open for this procedure. - Under Visit Info/Reason for Call, type in Other and utilize the format Clearance MM/DD/YY or Clearance TBD. Do not use dashes or single digits. - If request is for dental extraction, please clarify the # of teeth to be extracted.  Request for surgical clearance:  What type of surgery is being performed? Cervical Fusion  1. When is this surgery scheduled? 07/16/20  2. What type of clearance is required (medical clearance vs. Pharmacy clearance to hold med vs. Both)?  both  3. Are there any medications that need to be held prior to surgery and how long? Please advise   4. Practice name and name of physician performing surgery? Dr Consuella Lose  5. What is the office phone number? [phone] ex [phone]   7.   What is the office fax number? [phone]  8.   Anesthesia type (None, local, MAC, general) ? general   Stephanie Ewing 07/08/2020, 10:13 AM  _________________________________________________________________   (provider comments below)

## 2020-07-08 NOTE — Telephone Encounter (Signed)
   Primary Cardiologist: Carlyle Dolly, MD  Chart reviewed as part of pre-operative protocol coverage. Patient was contacted 07/08/2020 in reference to pre-operative risk assessment for pending surgery as outlined below.  Stephanie Ewing was last seen on 06/15/2020 by Dr. Harl Bowie.  Since that day, Stephanie Ewing has done well without exertional chest pain or shortness of breath.  Strenuous activity is mainly limited by back issue.  She can achieve more than 4 METS of activity if needs to.  She will need to hold aspirin for 7 days prior to the procedure and restart as soon as possible afterward at the surgeon's discretion.  Therefore, based on ACC/AHA guidelines, the patient would be at acceptable risk for the planned procedure without further cardiovascular testing.   The patient was advised that if she develops new symptoms prior to surgery to contact our office to arrange for a follow-up visit, and she verbalized understanding.  I will route this recommendation to the requesting party via Epic fax function and remove from pre-op pool. Please call with questions.  [place], [place] 07/08/2020, 3:41 PM

## 2020-07-12 ENCOUNTER — Ambulatory Visit (HOSPITAL_COMMUNITY): Admission: RE | Admit: 2020-07-12 | Payer: Medicare Other | Source: Ambulatory Visit

## 2020-07-12 NOTE — H&P (Signed)
Chief Complaint   No chief complaint on file.   HPI   HPI: Stephanie Ewing is a 65 y.o. female with several month history of relatively severe right-sided neck and arm pain. Pain starts on the right side of her neck into her right axillar, down the right arm to the elbow. She has associated N/T in the same distribution with further involvement of her right hand and 4th and 5th digits.  She has also noted subjective weakness in the right arm especially with grip strength.  She underwent MRI for cervical spine which revealed varying degrees of mod-severe central and foraminal stenosis from C4-5 - C7-T1. She has failed reasonable conservative treatment including po medications, physical therapy, and ESI.  He presents today for cervical decompression fusion.  She is without any concerns.  Of note, the patient does have a history of psoriatic arthritis for which she gets infusions by her rheumatologist.  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 She was initially treated with Eliquis however after treatment with spironolactone her AFib has resolved and she is currently only on a baby aspirin. She has been off ASA in preparation for surgery.   Patient Active Problem List   Diagnosis Date Noted  . Hemoptysis   . Acute pancreatitis 05/06/2020  . Family history of pancreatic cancer 04/15/2020  . Hospice care patient 04/15/2020  . Palliative care patient 04/15/2020  . Pancreatitis 04/13/2020  . Hypertension     PMH: Past Medical History:  Diagnosis Date  . Afib ([place])   .  Asthma   . Depression   . Hyperaldosteronism ([place])   . Hypertension   . Hypokalemia   . Insomnia   . Migraine   . Nicotine dependence, cigarettes, uncomplicated   . Pancreatitis   . PONV (postoperative nausea and vomiting)   . Pre-diabetes   . Thyroid disease    hypothyroid    PSH: Past Surgical History:  Procedure Laterality Date  . BIOPSY  06/28/2020   Procedure: BIOPSY;  Surgeon: Rush Landmark Telford Nab., MD;  Location: [facility];  Service: Gastroenterology;;  . ESOPHAGOGASTRODUODENOSCOPY (EGD) WITH PROPOFOL N/A 06/28/2020   Procedure: ESOPHAGOGASTRODUODENOSCOPY (EGD) WITH PROPOFOL;  Surgeon: Irving Copas., MD;  Location: [facility];  Service: Gastroenterology;  Laterality: N/A;  . EUS N/A 06/28/2020   Procedure: UPPER ENDOSCOPIC ULTRASOUND (EUS) LINEAR;  Surgeon: Irving Copas., MD;  Location: [facility];  Service: Gastroenterology;  Laterality: N/A;  . FINE NEEDLE ASPIRATION  06/28/2020   Procedure: FINE NEEDLE ASPIRATION (FNA) LINEAR;  Surgeon: Irving Copas., MD;  Location: [facility];  Service: Gastroenterology;;  . POLYPECTOMY  06/28/2020   Procedure: POLYPECTOMY;  Surgeon: Irving Copas., MD;  Location: [facility];  Service: Gastroenterology;;  . TOTAL ABDOMINAL HYSTERECTOMY  2015    No medications prior to admission.    SH: Social History   Tobacco Use  . Smoking status: Current Every Day Smoker    Packs/day: 0.50    Types: Cigarettes    Start date: 03/12/1971  . Smokeless tobacco: Never Used  . Tobacco comment: 1  pack daily  Substance Use Topics  . Alcohol use: Not Currently  . Drug use: Never    MEDS: Prior to Admission medications   Medication Sig Start Date End Date Taking? Authorizing Provider  acetaminophen (TYLENOL) 325 MG tablet Take 2 tablets (650 mg total) by mouth every 6 (six) hours. Patient taking differently: Take 650 mg by mouth every 6 (six) hours as needed for mild pain. 04/15/20  Yes [name], MD  aspirin EC 81 MG tablet Take 81 mg by mouth at bedtime.   Yes [provider]  Cannabidiol POWD Apply 1 application topically as needed (pain). CBD Oil/CBD Cream   Yes [provider]  Cholecalciferol (VITAMIN D) 50 MCG (2000 UT) CAPS Take 2,000 Units by mouth daily.   Yes [provider]  citalopram (CELEXA) 20 MG tablet Take 20 mg by mouth at bedtime.   Yes [provider]  diltiazem (CARDIZEM) 30 MG tablet Take 1 tablet every 4 hours AS NEEDED for heart rate >100 as long as top blood pressure >100. Patient taking differently: Take 30 mg by mouth as directed. Take 1 tablet (30mg )  every 4 hours AS NEEDED for heart rate >100 as long as top blood pressure >100. 09/19/19  Yes Sherran Needs, NP  famotidine (PEPCID) 20 MG tablet Take 20 mg by mouth daily as needed for indigestion.  07/14/19  Yes [provider]  fexofenadine (ALLEGRA) 180 MG tablet Take 180 mg by mouth daily.   Yes [provider]  gabapentin (NEURONTIN) 300 MG capsule Take 300 mg by mouth 4 (four) times daily.   Yes [provider]  omeprazole (PRILOSEC) 40 MG capsule Take 1 capsule (40 mg total) by mouth 2 (two) times daily before a meal. 06/28/20 06/28/21 Yes Mansouraty, Telford Nab., MD  ondansetron (ZOFRAN) 4 MG tablet Take 1 tablet (4 mg total) by mouth every 8 (eight) hours as needed for nausea. 05/10/20  Yes Lattie Haw, MD  oxyCODONE (OXY IR/ROXICODONE) 5 MG immediate release tablet Take 0.5-1 tablets (2.5-5 mg total) by mouth every 4 (four) hours as needed for breakthrough pain. 05/10/20  Yes Lattie Haw, MD  polyethylene glycol (MIRALAX) 17 g packet Take 17 g by mouth daily as needed. Patient taking differently: Take 17 g by mouth every other day. 06/10/20  Yes Armbruster, [name], MD  promethazine (PHENERGAN) 12.5 MG tablet Take 12.5 mg by mouth 3 (three) times daily as needed for nausea/vomiting. 05/25/20  Yes [provider]  RESTASIS  0.05 % ophthalmic emulsion Place 1 drop into both eyes 2 (two) times daily. 07/29/19  Yes [provider]  sodium chloride (OCEAN) 0.65 % SOLN nasal spray Place 1 spray into both nostrils as needed for congestion.   Yes [provider]  spironolactone (ALDACTONE) 100 MG tablet Take 100 mg by mouth daily.   Yes [provider]  SYNTHROID 125 MCG tablet Take 125 mcg by mouth daily. 05/14/20  Yes [provider]  tiZANidine (ZANAFLEX) 4 MG tablet Take 4 mg by mouth every 6 (six) hours as needed for spasms. 05/28/20  Yes [provider]  traMADol (ULTRAM) 50 MG tablet Take 25-50 mg by mouth every 6 (six) hours as needed for moderate pain.   Yes [provider]  traZODone (DESYREL) 100 MG tablet Take 100 mg by mouth at bedtime. Taking with 150 mg = 250 mg at bedtime   Yes [provider]  traZODone (DESYREL) 150 MG tablet Take 150 mg by mouth  at bedtime. Taking with 100 mg = 250 mg at bedtime 12/28/17  Yes [provider]    ALLERGY: No Known Allergies  Social History   Tobacco Use  . Smoking status: Current Every Day Smoker    Packs/day: 0.50    Types: Cigarettes    Start date: 03/12/1971  . Smokeless tobacco: Never Used  . Tobacco comment: 1 pack daily  Substance Use Topics  . Alcohol use: Not Currently     Family History  Problem Relation Age of Onset  . Hypertension Mother   . Thyroid disease Mother        hypothyroidism  . Thyroid cancer Cousin   . Asthma Father   . Heart disease Father        from scarlet fever; died age 61 MVA  . Hypertension Sister   . Hypothyroidism Sister   . Hypertension Maternal Grandmother   . Heart failure Maternal Grandmother   . Diabetes Paternal Grandmother   . Heart failure Paternal Grandmother      ROS   ROS  Exam   There were no vitals filed for this visit. General appearance: WDWN, NAD Eyes: No scleral injection Cardiovascular: Regular rate and rhythm without  murmurs, rubs, gallops. No edema or variciosities. Distal pulses normal. Pulmonary: Effort normal, non-labored breathing Musculoskeletal:     Muscle tone upper extremities: Normal    Muscle tone lower extremities: Normal    Motor exam:    Right Left Deltoid: 5/5 5/5 Biceps: 4+/5 5/5 Triceps: 4+/5 5/5 Brachioradialis: 5/5 5/5 Wrist Extensor: 5/5 5/5 Grip: 4/5 5/5 Finger Extensor: 4-/5 5/5   Neurological Mental Status:    - Patient is awake, alert, oriented to person, place, month, year, and situation    - Patient is able to give a clear and coherent history.    - No signs of aphasia or neglect Cranial Nerves    - II: Visual Fields are full. PERRL    - III/IV/VI: EOMI without ptosis or diploplia.     - V: Facial sensation is grossly normal    - VII: Facial movement is symmetric.     - VIII: hearing is intact to voice    - X: Uvula elevates symmetrically    - XI: Shoulder shrug is symmetric.    - XII: tongue is midline without atrophy or fasciculations.  Sensory: Sensation grossly intact to LT  Results - Imaging/Labs   No results found for this or any previous visit (from the past 48 hour(s)).  No results found.  IMAGING: MRI of the cervical spine dated 06/05/2020 was personally reviewed.  This demonstrates maintenance there is multilevel broad-based disc bulge worst at C4-5 C5-6 and C6-7 where there is broad-based disc osteophyte complex and resultant severe central stenosis at C4-5 and C5-6 and moderate stenosis at C6-7.  At C7-T1 there is a right eccentric disc osteophyte with right-sided foraminal disease.  Impression/Plan   65 y.o. female with severe right-sided neck and arm pain consistent with multilevel cervical radiculopathy including the C5, C6, C7, and C8 nerves.  She also has some subjective symptoms of myelopathy although no radiographic spinal cord signal change.  Given her lack of improvement with conservative treatment and her central stenosis on MRI I do  think that at this point surgical decompression is indicated.  We will proceed with anterior cervical diskectomy and fusion at C4-5, C5-6, C6-7 and C7-T1 if possible  In this particular case, I did tell her that there is a possibility that due  to her anatomy we may not be able to safely access the C7-T1 disc space anteriorly.  In this situation we will plan on assessing her condition postoperatively, allow her to recover for a few weeks, and if needed decompress C7-T1 posteriorly.  We have reviewed the indications for surgery, the associated risks, benefits and alternatives at length in the office.  All questions today were answered and consent was obtained.  Consuella Lose, MD [facility]

## 2020-07-13 ENCOUNTER — Other Ambulatory Visit: Payer: Self-pay

## 2020-07-13 ENCOUNTER — Other Ambulatory Visit (HOSPITAL_COMMUNITY)
Admission: RE | Admit: 2020-07-13 | Discharge: 2020-07-13 | Disposition: A | Discharge status: Home / Self Care | Payer: Medicare Other | Source: Ambulatory Visit | Attending: Neurosurgery | Admitting: Neurosurgery

## 2020-07-13 ENCOUNTER — Encounter (HOSPITAL_COMMUNITY): Payer: Self-pay

## 2020-07-13 ENCOUNTER — Encounter (HOSPITAL_COMMUNITY)
Admission: RE | Admit: 2020-07-13 | Discharge: 2020-07-13 | Disposition: A | Discharge status: Home / Self Care | Payer: Medicare Other | Source: Ambulatory Visit | Attending: Neurosurgery | Admitting: Neurosurgery

## 2020-07-13 DIAGNOSIS — Z20822 Contact with and (suspected) exposure to covid-19: Secondary | ICD-10-CM | POA: Insufficient documentation

## 2020-07-13 DIAGNOSIS — Z01812 Encounter for preprocedural laboratory examination: Secondary | ICD-10-CM | POA: Diagnosis not present

## 2020-07-13 HISTORY — DX: Unspecified osteoarthritis, unspecified site: M19.90

## 2020-07-13 HISTORY — DX: Gastro-esophageal reflux disease without esophagitis: K21.9

## 2020-07-13 HISTORY — DX: Constipation, unspecified: K59.00

## 2020-07-13 HISTORY — DX: Personal history of other diseases of the digestive system: Z87.19

## 2020-07-13 HISTORY — DX: Hypothyroidism, unspecified: E03.9

## 2020-07-13 HISTORY — DX: Cardiac arrhythmia, unspecified: I49.9

## 2020-07-13 LAB — CBC
HCT: 49.1 % — ABNORMAL HIGH (ref 36.0–46.0)
Hemoglobin: 15.9 g/dL — ABNORMAL HIGH (ref 12.0–15.0)
MCH: 30.1 pg (ref 26.0–34.0)
MCHC: 32.4 g/dL (ref 30.0–36.0)
MCV: 93 fL (ref 80.0–100.0)
Platelets: 254 10*3/uL (ref 150–400)
RBC: 5.28 MIL/uL — ABNORMAL HIGH (ref 3.87–5.11)
RDW: 13.2 % (ref 11.5–15.5)
WBC: 7.9 10*3/uL (ref 4.0–10.5)
nRBC: 0 % (ref 0.0–0.2)

## 2020-07-13 LAB — SURGICAL PCR SCREEN
MRSA, PCR: NEGATIVE
Staphylococcus aureus: NEGATIVE

## 2020-07-13 LAB — BASIC METABOLIC PANEL
Anion gap: 11 (ref 5–15)
BUN: 23 mg/dL (ref 8–23)
CO2: 26 mmol/L (ref 22–32)
Calcium: 9.8 mg/dL (ref 8.9–10.3)
Chloride: 100 mmol/L (ref 98–111)
Creatinine, Ser: 1.19 mg/dL — ABNORMAL HIGH (ref 0.44–1.00)
GFR, Estimated: 51 mL/min — ABNORMAL LOW (ref >60)
Glucose, Bld: 93 mg/dL (ref 70–99)
Potassium: 4.5 mmol/L (ref 3.5–5.1)
Sodium: 137 mmol/L (ref 135–145)

## 2020-07-13 LAB — HEMOGLOBIN A1C
Hgb A1c MFr Bld: 6 % — ABNORMAL HIGH (ref 4.8–5.6)
Mean Plasma Glucose: 125.5 mg/dL

## 2020-07-13 LAB — SARS CORONAVIRUS 2 (TAT 6-24 HRS): SARS Coronavirus 2: NEGATIVE

## 2020-07-13 NOTE — Pre-Procedure Instructions (Signed)
    Stephanie Ewing  07/13/2020    PCP -   Cardiologist -   Chest x-ray -   EKG -   Stress Test -   ECHO -   Cardiac Cath -   AICD- PM- LOOP-  Sleep Study -  CPAP -   LABS-  ASA-  ERAS-  HA1C- Fasting Blood Sugar -  Checks Blood Sugar _____ times a day  Anesthesia-  Pt denies having chest pain, sob, or fever at this time. All instructions explained to the pt, with a verbal understanding of the material. Pt agrees to go over the instructions while at home for a better understanding. Pt also instructed to self quarantine after being tested for COVID-19. The opportunity to ask questions was provided.

## 2020-07-13 NOTE — Progress Notes (Signed)
PCP - Dr A. [facility]  Cardiologist - Dr Harl Bowie  Chest x-ray - 05/26/20  EKG - 05/26/20  Stress Test - no  ECHO - 10/29/19  Cardiac Cath - no  AICD-no PM-no LOOP-no  Sleep Study - na CPAP - na LABS-  ASA- stopped 06/21/20 vor Endoscopy   ERAS-no  HA1C- Fasting Blood Sugar -pre- diabetes- does not check`  Checks Blood Sugar ___0_ times a day  Anesthesia-  Pt denies having chest pain, sob, or fever at this time. All instructions explained to the pt, with a verbal understanding of the material. Pt agrees to go over the instructions while at home for a better understanding. Pt also instructed to self quarantine after being tested for COVID-19. The opportunity to ask questions was provided.

## 2020-07-13 NOTE — Pre-Procedure Instructions (Signed)
Stephanie Ewing  07/13/2020     Your procedure is scheduled on Friday, March 4.  Report to [facility], Main Entrance or Entrance "A" at 9:10 AM .                Your surgery or procedure is scheduled to begin at 11:10   Call this number if you have problems the morning of surgery: [phone]  This is the number for the Pre- Surgical Desk.                For any other questions, please call [phone], Monday - Friday 8 AM - 4 PM.   Remember:  Do not eat or drink after midnight Thursday 3.   Take these medicines the morning of surgery with A SIP OF WATER:     STOP taking Aspirin, Aspirin Products (Goody Powder, Excedrin Migraine), Ibuprofen (Advil), Naproxen (Aleve), Vitamins and Herbal Products (ie Fish Oil).    Special instructions:    [facility]- Preparing For Surgery  Before surgery, you can play an important role. Because skin is not sterile, your skin needs to be as free of germs as possible. You can reduce the number of germs on your skin by washing with CHG (chlorahexidine gluconate) Soap before surgery.  CHG is an antiseptic cleaner which kills germs and bonds with the skin to continue killing germs even after washing.    Oral Hygiene is also important to reduce your risk of infection.  Remember - BRUSH YOUR TEETH THE MORNING OF SURGERY WITH YOUR REGULAR TOOTHPASTE  Please do not use if you have an allergy to CHG or antibacterial soaps. If your skin becomes reddened/irritated stop using the CHG.  Do not shave (including legs and underarms) for at least 48 hours prior to first CHG shower. It is OK to shave your face.  Please follow these instructions carefully.   1. Shower the NIGHT BEFORE SURGERY and the MORNING OF SURGERY with CHG.   2. If you chose to wash your hair, wash your hair first as usual with your normal shampoo.  3. After you shampoo, wash your face and private area with the soap you use at home, then rinse your hair and body thoroughly to  remove the shampoo and soap.  4. Use CHG as you would any other liquid soap. You can apply CHG directly to the skin and wash gently with a scrungie or a clean washcloth.   5. Apply the CHG Soap to your body ONLY FROM THE NECK DOWN.  Do not use on open wounds or open sores. Avoid contact with your eyes, ears, mouth and genitals (private parts).   6. Wash thoroughly, paying special attention to the area where your surgery will be performed.  7. Thoroughly rinse your body with warm water from the neck down.  8. DO NOT shower/wash with your normal soap after using and rinsing off the CHG Soap.  9. Pat yourself dry with a CLEAN TOWEL.  10. Wear CLEAN PAJAMAS to bed the night before surgery, wear comfortable clothes the morning of surgery  11. Place CLEAN SHEETS on your bed the night of your first shower and DO NOT SLEEP WITH PETS.  Day of Surgery: Shower as instructed above. Do not apply any deodorants/lotions, powders or colognes.  Please wear clean clothes to the hospital/surgery center.   Remember to brush your teeth WITH YOUR REGULAR TOOTHPASTE.  Do not wear jewelry, make-up or nail polish.  Do not  shave 48 hours prior to surgery.  Men may shave face and neck.  Do not bring valuables to the hospital.  [facility] is not responsible for any belongings or valuables.  Contacts, dentures or bridgework may not be worn into surgery.  Leave your suitcase in the car.  After surgery it may be brought to your room.  For patients admitted to the hospital, discharge time will be determined by your treatment team.  Patients discharged the day of surgery will not be allowed to drive home.   Please read over the fact sheets that you were given.

## 2020-07-13 NOTE — Pre-Procedure Instructions (Addendum)
Stephanie Ewing  07/13/2020      Your procedure is scheduled on Friday, March 4.  Report to [facility], Main Entrance or Entrance "A" at 9:10 AM .                Your surgery or procedure is scheduled to begin at  11:10 AM  Call this number if you have problems the morning of surgery: [phone]  This is the number for the Pre- Surgical Desk.                For any other questions, please call [phone], Monday - Friday 8 AM - 4 PM.   Remember:  Do not eat or drink after midnight Thursday, March 3   Take these medicines the morning of surgery with A SIP OF WATER : fexofenadine (ALLEGRA) gabapentin (NEURONTIN) omeprazole (PRILOSEC) RESTASIS 0.05 % ophthalmic emulsion SYNTHROID   Take if needed: acetaminophen (TYLENOL) diltiazem (CARDIZEM) ondansetron (ZOFRAN) or promethazine (PHENERGAN) sodium chloride (OCEAN) nasal spray oxyCODONE (OXY IR/ROXICODONE) tiZANidine (ZANAFLEX)  traMADol (ULTRAM)  Stop  Aspirin, Aspirin Products (Goody Powder, Excedrin Migraine), Ibuprofen (Advil), Naproxen (Aleve), Vitamins and Herbal Products (ie Fish Oil).    Special instructions:   DO NOT Smoke within 24 hours prior to surgery.  [facility]- Preparing For Surgery  Before surgery, you can play an important role. Because skin is not sterile, your skin needs to be as free of germs as possible. You can reduce the number of germs on your skin by washing with CHG (chlorahexidine gluconate) Soap before surgery.  CHG is an antiseptic cleaner which kills germs and bonds with the skin to continue killing germs even after washing.    Oral Hygiene is also important to reduce your risk of infection.  Remember - BRUSH YOUR TEETH THE MORNING OF SURGERY WITH YOUR REGULAR TOOTHPASTE  Please do not use if you have an allergy to CHG or antibacterial soaps. If your skin becomes reddened/irritated stop using the CHG.  Do not shave (including legs and underarms) for at least 48 hours prior to  first CHG shower. It is OK to shave your face.  Please follow these instructions carefully.   1. Shower the NIGHT BEFORE SURGERY and the MORNING OF SURGERY with CHG.   2. If you chose to wash your hair, wash your hair first as usual with your normal shampoo.  3. After you shampoo, wash your face and private area with the soap you use at home, then rinse your hair and body thoroughly to remove the shampoo and soap.  4. Use CHG as you would any other liquid soap. You can apply CHG directly to the skin and wash gently with a scrungie or a clean washcloth.   5. Apply the CHG Soap to your body ONLY FROM THE NECK DOWN.  Do not use on open wounds or open sores. Avoid contact with your eyes, ears, mouth and genitals (private parts).   6. Wash thoroughly, paying special attention to the area where your surgery will be performed.  7. Thoroughly rinse your body with warm water from the neck down.  8. DO NOT shower/wash with your normal soap after using and rinsing off the CHG Soap.  9. Pat yourself dry with a CLEAN TOWEL.  10. Wear CLEAN PAJAMAS to bed the night before surgery, wear comfortable clothes the morning of surgery  11. Place CLEAN SHEETS on your bed the night of your first shower and DO NOT SLEEP WITH  PETS.  Day of Surgery: Shower as instructed above. Do not apply any deodorants/lotions, powders or colognes.  Please wear clean clothes to the hospital/surgery center.   Remember to brush your teeth WITH YOUR REGULAR TOOTHPASTE.  Do not wear jewelry, make-up or nail polish.  Do not shave 48 hours prior to surgery.  Men may shave face and neck.  Do not bring valuables to the hospital.  [facility] is not responsible for any belongings or valuables.  Contacts, dentures or bridgework may not be worn into surgery.  Leave your suitcase in the car.  After surgery it may be brought to your room.  For patients admitted to the hospital, discharge time will be determined by your treatment  team.  Patients discharged the day of surgery will not be allowed to drive home.   Please read over the fact sheets that you were given.

## 2020-07-14 ENCOUNTER — Other Ambulatory Visit: Payer: Self-pay

## 2020-07-14 NOTE — Progress Notes (Signed)
The proposed treatment discussed in conference is for discussion purposes only and is not a binding recommendation.  The patients have not been physically examined, or presented with their treatment options.  Therefore, final treatment plans cannot be decided.   

## 2020-07-17 ENCOUNTER — Encounter: Payer: Self-pay | Admitting: Gastroenterology

## 2020-07-17 NOTE — Progress Notes (Signed)
Case discussed at [facility] this week. 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 Plan to repeat EUS is final consensus agreement before any additional imaging. Patient is already scheduled for follow-up EUS attempt. We will update the patient with the recommendations as noted above.  Justice Britain, MD [facility] Office # [phone]

## 2020-07-18 NOTE — Progress Notes (Signed)
Gabe thanks for your help with this case and the follow up

## 2020-07-19 ENCOUNTER — Encounter (HOSPITAL_COMMUNITY): Payer: Self-pay | Admitting: Neurosurgery

## 2020-07-19 ENCOUNTER — Other Ambulatory Visit (HOSPITAL_COMMUNITY)
Admission: RE | Admit: 2020-07-19 | Discharge: 2020-07-19 | Disposition: A | Discharge status: Home / Self Care | Payer: Medicare Other | Source: Ambulatory Visit | Attending: Neurosurgery | Admitting: Neurosurgery

## 2020-07-19 DIAGNOSIS — Z20822 Contact with and (suspected) exposure to covid-19: Secondary | ICD-10-CM | POA: Insufficient documentation

## 2020-07-19 DIAGNOSIS — Z01812 Encounter for preprocedural laboratory examination: Secondary | ICD-10-CM | POA: Insufficient documentation

## 2020-07-19 LAB — SARS CORONAVIRUS 2 (TAT 6-24 HRS): SARS Coronavirus 2: NEGATIVE

## 2020-07-19 NOTE — Progress Notes (Signed)
The pt has been advised and will proceed with case for tomorrow.

## 2020-07-19 NOTE — Progress Notes (Signed)
Spoke to patient and updated her arrival time. Asked her to follow instructions given to her by Stephanie Ewing on 07/13/20. I reviewed instructions with patient. States surgery was moved from Friday due to a physician issue. Going for COVID test today. States she will quarantine after COVID test today. Answered patient's questions about visitation policy.

## 2020-07-20 ENCOUNTER — Inpatient Hospital Stay (HOSPITAL_COMMUNITY)
Admission: RE | Admit: 2020-07-20 | Discharge: 2020-07-21 | DRG: 473 | Disposition: A | Discharge status: Home / Self Care | Payer: Medicare Other | Attending: Neurosurgery | Admitting: Neurosurgery

## 2020-07-20 ENCOUNTER — Inpatient Hospital Stay (HOSPITAL_COMMUNITY): Payer: Medicare Other | Admitting: Certified Registered"

## 2020-07-20 ENCOUNTER — Inpatient Hospital Stay (HOSPITAL_COMMUNITY)
Admission: RE | Disposition: A | Discharge status: Home / Self Care | Payer: Self-pay | Source: Home / Self Care | Attending: Neurosurgery

## 2020-07-20 ENCOUNTER — Encounter (HOSPITAL_COMMUNITY): Payer: Self-pay | Admitting: Neurosurgery

## 2020-07-20 ENCOUNTER — Inpatient Hospital Stay (HOSPITAL_COMMUNITY): Payer: Medicare Other

## 2020-07-20 DIAGNOSIS — Z419 Encounter for procedure for purposes other than remedying health state, unspecified: Secondary | ICD-10-CM

## 2020-07-20 DIAGNOSIS — G4700 Insomnia, unspecified: Secondary | ICD-10-CM | POA: Diagnosis present

## 2020-07-20 DIAGNOSIS — Z808 Family history of malignant neoplasm of other organs or systems: Secondary | ICD-10-CM

## 2020-07-20 DIAGNOSIS — Z79899 Other long term (current) drug therapy: Secondary | ICD-10-CM

## 2020-07-20 DIAGNOSIS — F32A Depression, unspecified: Secondary | ICD-10-CM | POA: Diagnosis present

## 2020-07-20 DIAGNOSIS — Z7982 Long term (current) use of aspirin: Secondary | ICD-10-CM

## 2020-07-20 DIAGNOSIS — Z90710 Acquired absence of both cervix and uterus: Secondary | ICD-10-CM

## 2020-07-20 DIAGNOSIS — E039 Hypothyroidism, unspecified: Secondary | ICD-10-CM | POA: Diagnosis present

## 2020-07-20 DIAGNOSIS — M4802 Spinal stenosis, cervical region: Principal | ICD-10-CM | POA: Diagnosis present

## 2020-07-20 DIAGNOSIS — L4050 Arthropathic psoriasis, unspecified: Secondary | ICD-10-CM | POA: Diagnosis present

## 2020-07-20 DIAGNOSIS — Z825 Family history of asthma and other chronic lower respiratory diseases: Secondary | ICD-10-CM | POA: Diagnosis not present

## 2020-07-20 DIAGNOSIS — M4722 Other spondylosis with radiculopathy, cervical region: Secondary | ICD-10-CM | POA: Diagnosis present

## 2020-07-20 DIAGNOSIS — Z8719 Personal history of other diseases of the digestive system: Secondary | ICD-10-CM

## 2020-07-20 DIAGNOSIS — I10 Essential (primary) hypertension: Secondary | ICD-10-CM | POA: Diagnosis present

## 2020-07-20 DIAGNOSIS — J45909 Unspecified asthma, uncomplicated: Secondary | ICD-10-CM | POA: Diagnosis present

## 2020-07-20 DIAGNOSIS — F17210 Nicotine dependence, cigarettes, uncomplicated: Secondary | ICD-10-CM | POA: Diagnosis present

## 2020-07-20 DIAGNOSIS — Z20822 Contact with and (suspected) exposure to covid-19: Secondary | ICD-10-CM | POA: Diagnosis present

## 2020-07-20 DIAGNOSIS — M5412 Radiculopathy, cervical region: Secondary | ICD-10-CM | POA: Diagnosis present

## 2020-07-20 DIAGNOSIS — Z8249 Family history of ischemic heart disease and other diseases of the circulatory system: Secondary | ICD-10-CM | POA: Diagnosis not present

## 2020-07-20 DIAGNOSIS — Z8349 Family history of other endocrine, nutritional and metabolic diseases: Secondary | ICD-10-CM | POA: Diagnosis not present

## 2020-07-20 HISTORY — PX: ANTERIOR CERVICAL DECOMPRESSION/DISCECTOMY FUSION 4 LEVELS: SHX5556

## 2020-07-20 LAB — TYPE AND SCREEN
ABO/RH(D): A POS
ABO/RH(D): A POS
Antibody Screen: NEGATIVE
Antibody Screen: NEGATIVE

## 2020-07-20 IMAGING — RF DG CERVICAL SPINE 2 OR 3 VIEWS
1 series · 2 of 2 positions shown · non-contrast
Comparison: Cervical radiographs [DATE].

CLINICAL DATA: ACDF C4-C5-C5-C6 C6-C7

EXAM:
CERVICAL SPINE - 2-3 VIEW; DG C-ARM 1-60 MIN

[Series 1: run · 2 of 2 slices shown]
[im 1/2]
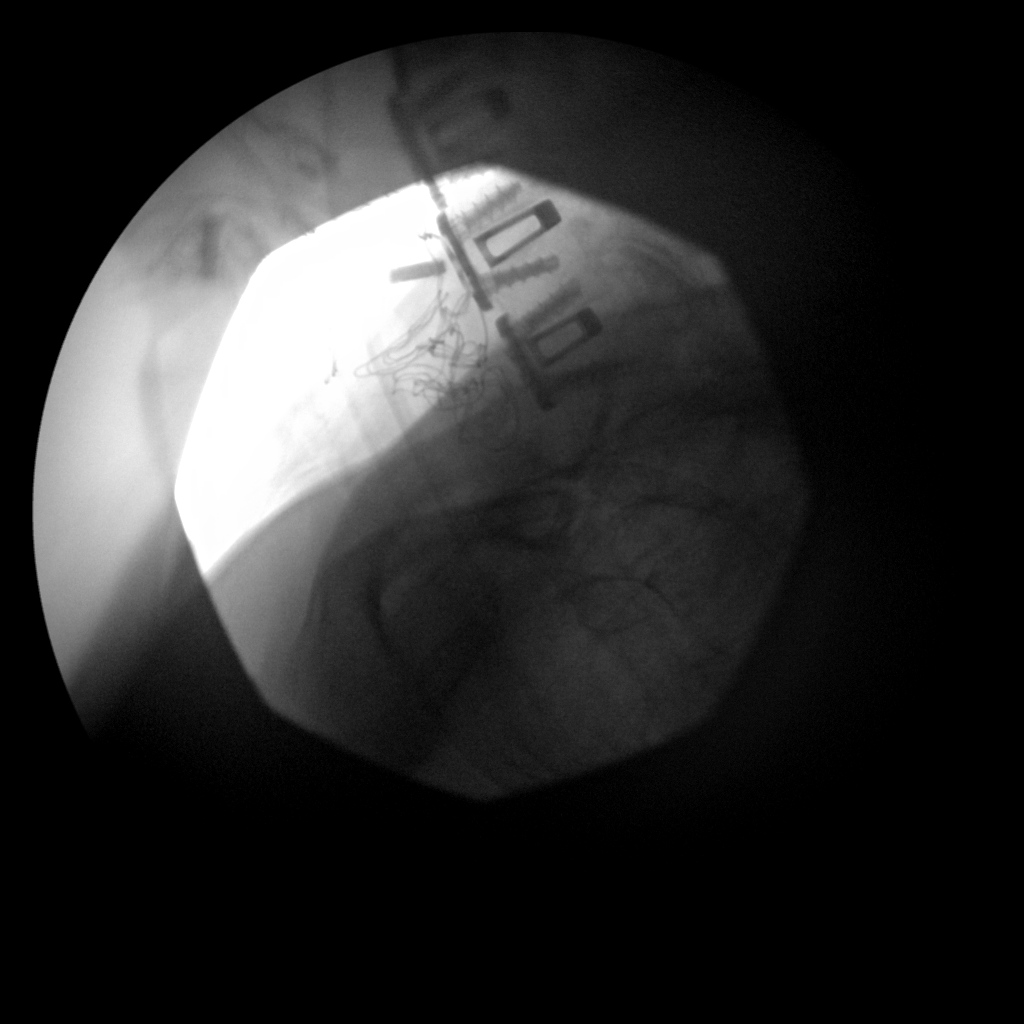
[im 2/2]
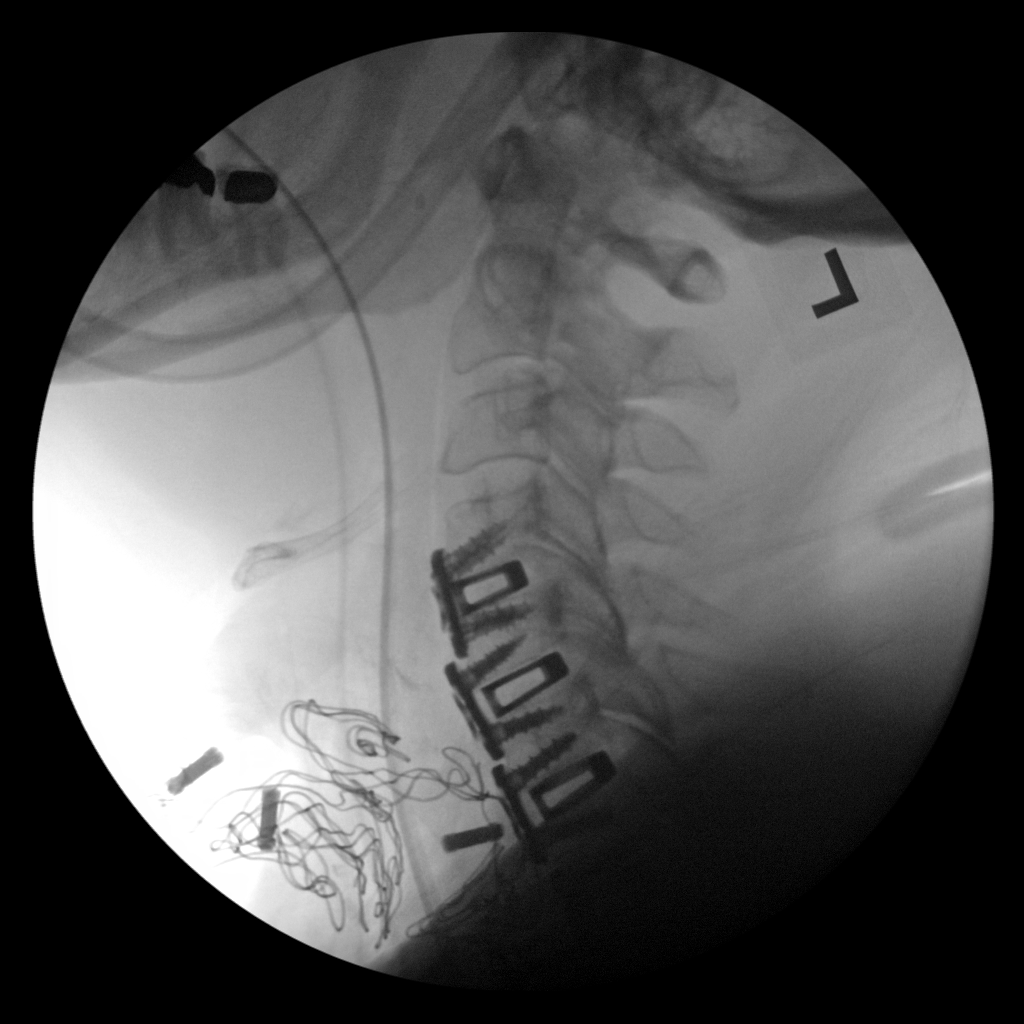

[2 of 2 positions shown; findings below may reference images not displayed]

FINDINGS: Fluoro time: 8 seconds.

Reported radiation: 4.69 mGy.

Two C-arm fluoroscopic images were obtained intraoperatively and
submitted for post operative interpretation. These images
demonstrate plate and screw fixation at C4-C5, C5-C6, and C6-C7 with
intervening spacers at these levels. Gauze projects in the ventral
neck. Please see the performing provider's procedural report for
further detail.
IMPRESSION: Intraoperative fluoroscopy, as detailed above.

## 2020-07-20 IMAGING — RF DG C-ARM 1-60 MIN
1 series · 2 of 2 positions shown · non-contrast
Comparison: Cervical radiographs [DATE].

CLINICAL DATA: ACDF C4-C5-C5-C6 C6-C7

EXAM:
CERVICAL SPINE - 2-3 VIEW; DG C-ARM 1-60 MIN

[Series 1: run · 2 of 2 slices shown]
[im 1/2]
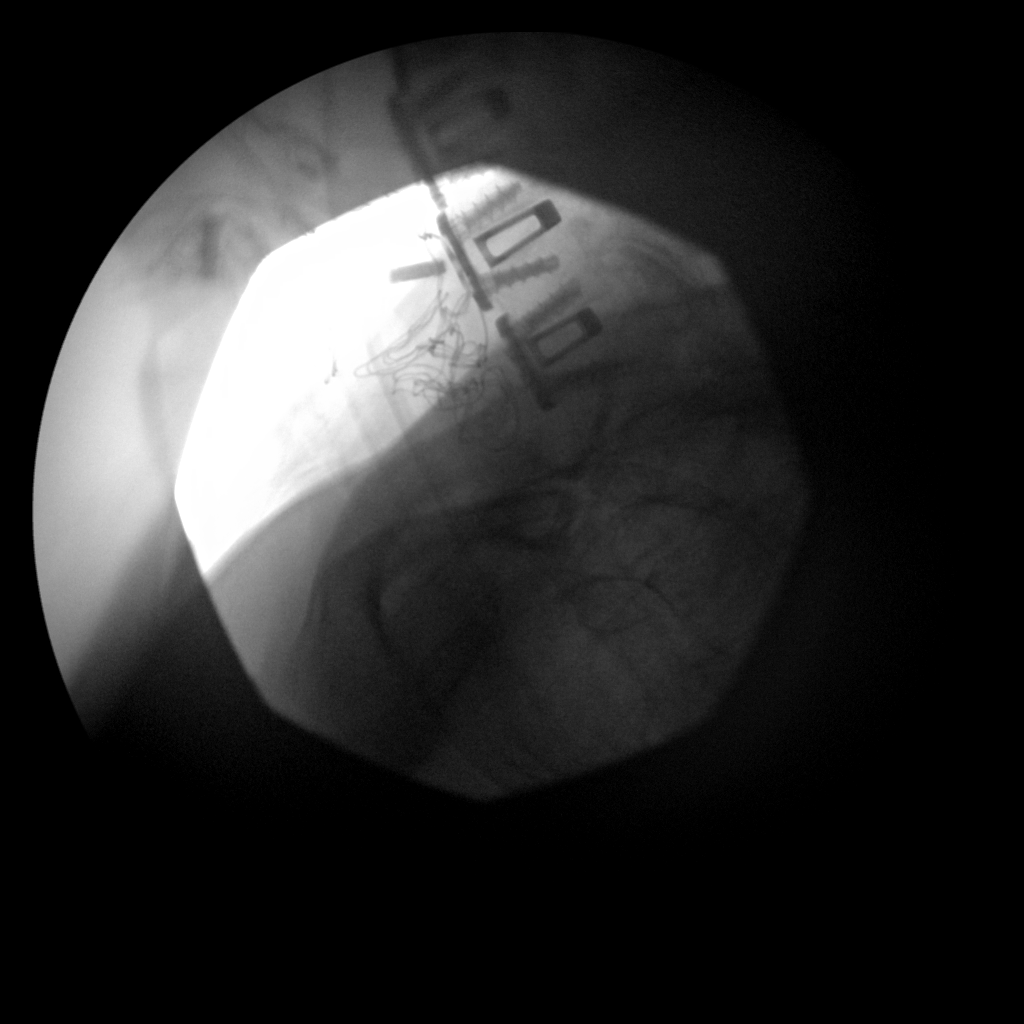
[im 2/2]
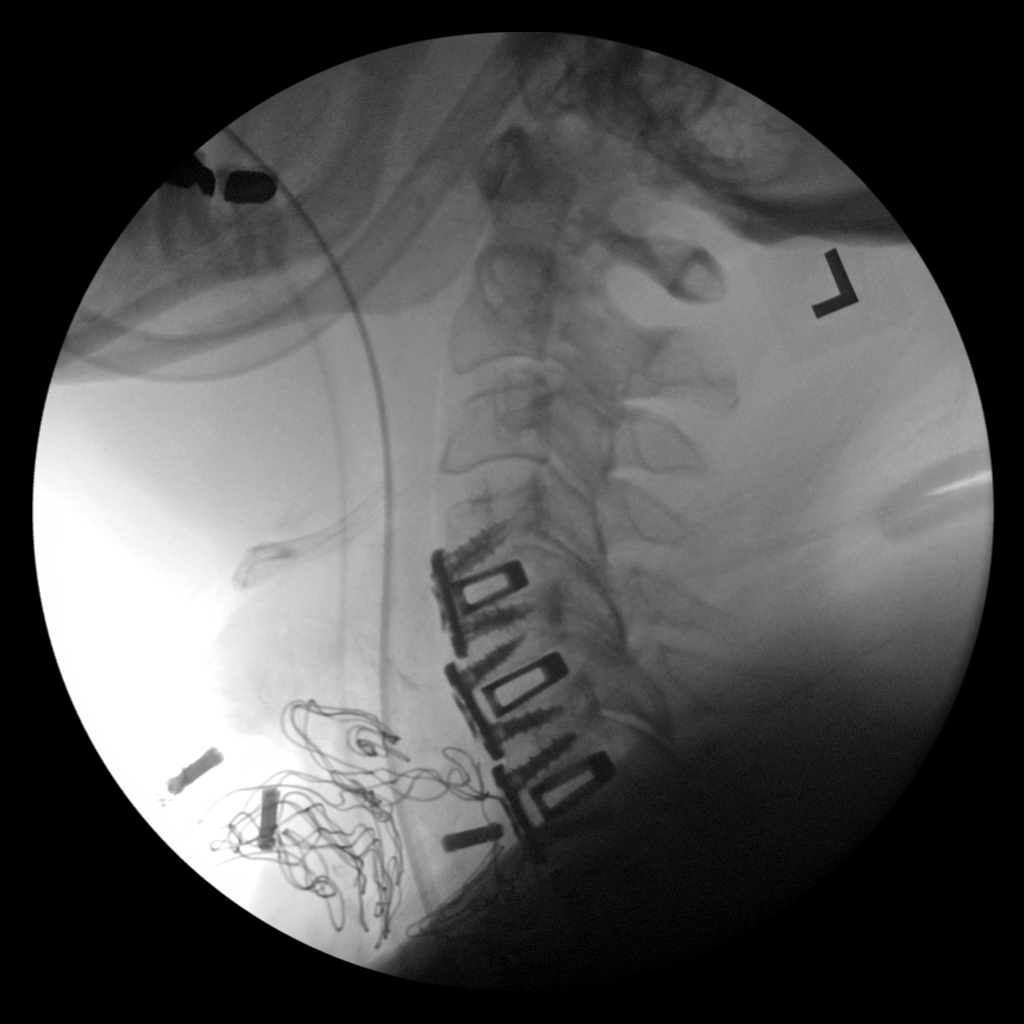

[2 of 2 positions shown; findings below may reference images not displayed]

FINDINGS: Fluoro time: 8 seconds.

Reported radiation: 4.69 mGy.

Two C-arm fluoroscopic images were obtained intraoperatively and
submitted for post operative interpretation. These images
demonstrate plate and screw fixation at C4-C5, C5-C6, and C6-C7 with
intervening spacers at these levels. Gauze projects in the ventral
neck. Please see the performing provider's procedural report for
further detail.
IMPRESSION: Intraoperative fluoroscopy, as detailed above.

## 2020-07-20 SURGERY — ANTERIOR CERVICAL DECOMPRESSION/DISCECTOMY FUSION 4 LEVELS
Anesthesia: General | Site: Spine Cervical

## 2020-07-20 MED ORDER — SODIUM CHLORIDE 0.9 % IV SOLN: INTRAVENOUS | Status: DC

## 2020-07-20 MED ORDER — DOCUSATE SODIUM 100 MG PO CAPS
100.0000 mg | ORAL_CAPSULE | Freq: Two times a day (BID) | ORAL | Status: DC
  Administered 2020-07-20: 100 mg via ORAL
  Filled 2020-07-20: qty 1

## 2020-07-20 MED ORDER — EPHEDRINE SULFATE 50 MG/ML IJ SOLN
INTRAMUSCULAR | Status: DC | PRN
  Administered 2020-07-20: 5 mg via INTRAVENOUS

## 2020-07-20 MED ORDER — MIDAZOLAM HCL 5 MG/5ML IJ SOLN
INTRAMUSCULAR | Status: DC | PRN
  Administered 2020-07-20: 2 mg via INTRAVENOUS

## 2020-07-20 MED ORDER — SPIRONOLACTONE 25 MG PO TABS: 100.0000 mg | ORAL_TABLET | Freq: Every day | ORAL | Status: DC

## 2020-07-20 MED ORDER — ORAL CARE MOUTH RINSE: 15.0000 mL | Freq: Once | OROMUCOSAL | Status: AC

## 2020-07-20 MED ORDER — PROPOFOL 10 MG/ML IV BOLUS
INTRAVENOUS | Status: AC
  Filled 2020-07-20: qty 20

## 2020-07-20 MED ORDER — OXYCODONE HCL 5 MG PO TABS: 5.0000 mg | ORAL_TABLET | Freq: Once | ORAL | Status: DC | PRN

## 2020-07-20 MED ORDER — DILTIAZEM HCL 30 MG PO TABS: 30.0000 mg | ORAL_TABLET | ORAL | Status: DC

## 2020-07-20 MED ORDER — LACTATED RINGERS IV SOLN: INTRAVENOUS | Status: DC

## 2020-07-20 MED ORDER — DEXAMETHASONE SODIUM PHOSPHATE 10 MG/ML IJ SOLN
INTRAMUSCULAR | Status: DC | PRN
  Administered 2020-07-20: 10 mg via INTRAVENOUS

## 2020-07-20 MED ORDER — DEXAMETHASONE SODIUM PHOSPHATE 10 MG/ML IJ SOLN
INTRAMUSCULAR | Status: AC
  Filled 2020-07-20: qty 2

## 2020-07-20 MED ORDER — OXYCODONE HCL 5 MG/5ML PO SOLN: 5.0000 mg | Freq: Once | ORAL | Status: DC | PRN

## 2020-07-20 MED ORDER — ROCURONIUM BROMIDE 10 MG/ML (PF) SYRINGE
PREFILLED_SYRINGE | INTRAVENOUS | Status: DC | PRN
  Administered 2020-07-20: 60 mg via INTRAVENOUS
  Administered 2020-07-20 (×4): 20 mg via INTRAVENOUS

## 2020-07-20 MED ORDER — ONDANSETRON HCL 4 MG/2ML IJ SOLN
INTRAMUSCULAR | Status: AC
  Filled 2020-07-20: qty 4

## 2020-07-20 MED ORDER — PROMETHAZINE HCL 25 MG/ML IJ SOLN: 6.2500 mg | INTRAMUSCULAR | Status: DC | PRN

## 2020-07-20 MED ORDER — BISACODYL 10 MG RE SUPP: 10.0000 mg | Freq: Every day | RECTAL | Status: DC | PRN

## 2020-07-20 MED ORDER — LIDOCAINE 2% (20 MG/ML) 5 ML SYRINGE
INTRAMUSCULAR | Status: DC | PRN
  Administered 2020-07-20: 80 mg via INTRAVENOUS

## 2020-07-20 MED ORDER — METHOCARBAMOL 500 MG PO TABS
500.0000 mg | ORAL_TABLET | Freq: Four times a day (QID) | ORAL | Status: DC | PRN
  Administered 2020-07-20 (×2): 500 mg via ORAL
  Filled 2020-07-20 (×2): qty 1

## 2020-07-20 MED ORDER — SCOPOLAMINE 1 MG/3DAYS TD PT72
MEDICATED_PATCH | TRANSDERMAL | Status: AC
  Administered 2020-07-20: 1.5 mg via TRANSDERMAL
  Filled 2020-07-20: qty 1

## 2020-07-20 MED ORDER — ROCURONIUM BROMIDE 10 MG/ML (PF) SYRINGE
PREFILLED_SYRINGE | INTRAVENOUS | Status: AC
  Filled 2020-07-20: qty 20

## 2020-07-20 MED ORDER — FENTANYL CITRATE (PF) 250 MCG/5ML IJ SOLN
INTRAMUSCULAR | Status: AC
  Filled 2020-07-20: qty 5

## 2020-07-20 MED ORDER — CHLORHEXIDINE GLUCONATE CLOTH 2 % EX PADS: 6.0000 | MEDICATED_PAD | Freq: Once | CUTANEOUS | Status: DC

## 2020-07-20 MED ORDER — ONDANSETRON HCL 4 MG PO TABS: 4.0000 mg | ORAL_TABLET | Freq: Three times a day (TID) | ORAL | Status: DC | PRN

## 2020-07-20 MED ORDER — OXYCODONE HCL 5 MG PO TABS: 5.0000 mg | ORAL_TABLET | Freq: Once | ORAL | Status: AC

## 2020-07-20 MED ORDER — METHOCARBAMOL 1000 MG/10ML IJ SOLN
500.0000 mg | Freq: Four times a day (QID) | INTRAVENOUS | Status: DC | PRN
  Filled 2020-07-20: qty 5

## 2020-07-20 MED ORDER — TRAZODONE HCL 100 MG PO TABS
100.0000 mg | ORAL_TABLET | Freq: Every day | ORAL | Status: DC
  Administered 2020-07-20: 100 mg via ORAL
  Filled 2020-07-20 (×2): qty 1

## 2020-07-20 MED ORDER — SODIUM CHLORIDE 0.9 % IV SOLN: 250.0000 mL | INTRAVENOUS | Status: DC

## 2020-07-20 MED ORDER — BUPIVACAINE HCL 0.5 % IJ SOLN
INTRAMUSCULAR | Status: DC | PRN
  Administered 2020-07-20: 4 mL

## 2020-07-20 MED ORDER — CHLORHEXIDINE GLUCONATE 0.12 % MT SOLN
OROMUCOSAL | Status: AC
  Administered 2020-07-20: 15 mL via OROMUCOSAL
  Filled 2020-07-20: qty 15

## 2020-07-20 MED ORDER — ACETAMINOPHEN 650 MG RE SUPP: 650.0000 mg | RECTAL | Status: DC | PRN

## 2020-07-20 MED ORDER — FLEET ENEMA 7-19 GM/118ML RE ENEM: 1.0000 | ENEMA | Freq: Once | RECTAL | Status: DC | PRN

## 2020-07-20 MED ORDER — TRAZODONE HCL 150 MG PO TABS
150.0000 mg | ORAL_TABLET | Freq: Every day | ORAL | Status: DC
  Administered 2020-07-20: 150 mg via ORAL
  Filled 2020-07-20: qty 1

## 2020-07-20 MED ORDER — SENNOSIDES-DOCUSATE SODIUM 8.6-50 MG PO TABS: 1.0000 | ORAL_TABLET | Freq: Every evening | ORAL | Status: DC | PRN

## 2020-07-20 MED ORDER — LEVOTHYROXINE SODIUM 25 MCG PO TABS
125.0000 ug | ORAL_TABLET | Freq: Every day | ORAL | Status: DC
  Administered 2020-07-21: 125 ug via ORAL
  Filled 2020-07-20: qty 1

## 2020-07-20 MED ORDER — 0.9 % SODIUM CHLORIDE (POUR BTL) OPTIME
TOPICAL | Status: DC | PRN
  Administered 2020-07-20 (×2): 1000 mL

## 2020-07-20 MED ORDER — CEFAZOLIN SODIUM-DEXTROSE 2-4 GM/100ML-% IV SOLN
2.0000 g | Freq: Three times a day (TID) | INTRAVENOUS | Status: AC
  Administered 2020-07-20 – 2020-07-21 (×2): 2 g via INTRAVENOUS
  Filled 2020-07-20 (×2): qty 100

## 2020-07-20 MED ORDER — HYDROMORPHONE HCL 1 MG/ML IJ SOLN
0.5000 mg | INTRAMUSCULAR | Status: DC | PRN
  Administered 2020-07-20: 1 mg via INTRAVENOUS
  Filled 2020-07-20: qty 1

## 2020-07-20 MED ORDER — ACETAMINOPHEN 325 MG PO TABS: 650.0000 mg | ORAL_TABLET | ORAL | Status: DC | PRN

## 2020-07-20 MED ORDER — CHLORHEXIDINE GLUCONATE 0.12 % MT SOLN
15.0000 mL | Freq: Once | OROMUCOSAL | Status: AC
  Filled 2020-07-20: qty 15

## 2020-07-20 MED ORDER — HYDROCODONE-ACETAMINOPHEN 5-325 MG PO TABS
1.0000 | ORAL_TABLET | ORAL | Status: DC | PRN
Start: 2020-07-20 — End: 2020-07-21

## 2020-07-20 MED ORDER — FENTANYL CITRATE (PF) 250 MCG/5ML IJ SOLN
INTRAMUSCULAR | Status: DC | PRN
  Administered 2020-07-20 (×5): 50 ug via INTRAVENOUS

## 2020-07-20 MED ORDER — THROMBIN 5000 UNITS EX SOLR
OROMUCOSAL | Status: DC | PRN
  Administered 2020-07-20 (×2): 5 mL via TOPICAL

## 2020-07-20 MED ORDER — MIDAZOLAM HCL 2 MG/2ML IJ SOLN
INTRAMUSCULAR | Status: AC
  Filled 2020-07-20: qty 2

## 2020-07-20 MED ORDER — OXYCODONE HCL 5 MG PO TABS
5.0000 mg | ORAL_TABLET | ORAL | Status: DC | PRN
  Administered 2020-07-20 (×3): 10 mg via ORAL
  Administered 2020-07-21: 5 mg via ORAL
  Administered 2020-07-21: 10 mg via ORAL
  Filled 2020-07-20: qty 2
  Filled 2020-07-20: qty 1
  Filled 2020-07-20 (×2): qty 2

## 2020-07-20 MED ORDER — LIDOCAINE 2% (20 MG/ML) 5 ML SYRINGE
INTRAMUSCULAR | Status: AC
  Filled 2020-07-20: qty 5

## 2020-07-20 MED ORDER — OXYCODONE HCL 5 MG PO TABS
ORAL_TABLET | ORAL | Status: AC
  Administered 2020-07-20: 5 mg via ORAL
  Filled 2020-07-20: qty 1

## 2020-07-20 MED ORDER — BUPIVACAINE HCL (PF) 0.5 % IJ SOLN
INTRAMUSCULAR | Status: AC
  Filled 2020-07-20: qty 30

## 2020-07-20 MED ORDER — SCOPOLAMINE 1 MG/3DAYS TD PT72
1.0000 | MEDICATED_PATCH | TRANSDERMAL | Status: DC
  Filled 2020-07-20: qty 1

## 2020-07-20 MED ORDER — LACTATED RINGERS IV SOLN: INTRAVENOUS | Status: DC | PRN

## 2020-07-20 MED ORDER — CEFAZOLIN SODIUM-DEXTROSE 2-4 GM/100ML-% IV SOLN
2.0000 g | INTRAVENOUS | Status: AC
  Administered 2020-07-20: 2 g via INTRAVENOUS
  Filled 2020-07-20: qty 100

## 2020-07-20 MED ORDER — SUGAMMADEX SODIUM 200 MG/2ML IV SOLN
INTRAVENOUS | Status: DC | PRN
  Administered 2020-07-20: 200 mg via INTRAVENOUS
  Administered 2020-07-20: 100 mg via INTRAVENOUS

## 2020-07-20 MED ORDER — DIPHENHYDRAMINE HCL 25 MG PO CAPS
25.0000 mg | ORAL_CAPSULE | Freq: Four times a day (QID) | ORAL | Status: DC | PRN
  Administered 2020-07-20: 25 mg via ORAL
  Filled 2020-07-20: qty 1

## 2020-07-20 MED ORDER — ONDANSETRON HCL 4 MG/2ML IJ SOLN: 4.0000 mg | Freq: Four times a day (QID) | INTRAMUSCULAR | Status: DC | PRN

## 2020-07-20 MED ORDER — PROPOFOL 10 MG/ML IV BOLUS
INTRAVENOUS | Status: DC | PRN
  Administered 2020-07-20: 130 mg via INTRAVENOUS

## 2020-07-20 MED ORDER — CEFAZOLIN SODIUM-DEXTROSE 2-4 GM/100ML-% IV SOLN
INTRAVENOUS | Status: AC
  Filled 2020-07-20: qty 100

## 2020-07-20 MED ORDER — PANTOPRAZOLE SODIUM 40 MG PO TBEC: 80.0000 mg | DELAYED_RELEASE_TABLET | Freq: Every day | ORAL | Status: DC

## 2020-07-20 MED ORDER — ONDANSETRON HCL 4 MG PO TABS: 4.0000 mg | ORAL_TABLET | Freq: Four times a day (QID) | ORAL | Status: DC | PRN

## 2020-07-20 MED ORDER — CITALOPRAM HYDROBROMIDE 20 MG PO TABS
20.0000 mg | ORAL_TABLET | Freq: Every day | ORAL | Status: DC
  Administered 2020-07-20: 20 mg via ORAL
  Filled 2020-07-20: qty 1

## 2020-07-20 MED ORDER — ONDANSETRON HCL 4 MG/2ML IJ SOLN
INTRAMUSCULAR | Status: DC | PRN
  Administered 2020-07-20: 4 mg via INTRAVENOUS

## 2020-07-20 MED ORDER — ACETAMINOPHEN 10 MG/ML IV SOLN
1000.0000 mg | Freq: Once | INTRAVENOUS | Status: DC | PRN
  Administered 2020-07-20: 1000 mg via INTRAVENOUS

## 2020-07-20 MED ORDER — ACETAMINOPHEN 10 MG/ML IV SOLN
INTRAVENOUS | Status: AC
  Filled 2020-07-20: qty 100

## 2020-07-20 MED ORDER — SODIUM CHLORIDE 0.9% FLUSH: 3.0000 mL | INTRAVENOUS | Status: DC | PRN

## 2020-07-20 MED ORDER — MENTHOL 3 MG MT LOZG: 1.0000 | LOZENGE | OROMUCOSAL | Status: DC | PRN

## 2020-07-20 MED ORDER — PHENYLEPHRINE 40 MCG/ML (10ML) SYRINGE FOR IV PUSH (FOR BLOOD PRESSURE SUPPORT)
PREFILLED_SYRINGE | INTRAVENOUS | Status: AC
  Filled 2020-07-20: qty 20

## 2020-07-20 MED ORDER — PHENYLEPHRINE 40 MCG/ML (10ML) SYRINGE FOR IV PUSH (FOR BLOOD PRESSURE SUPPORT)
PREFILLED_SYRINGE | INTRAVENOUS | Status: DC | PRN
  Administered 2020-07-20 (×5): 80 ug via INTRAVENOUS

## 2020-07-20 MED ORDER — FAMOTIDINE 20 MG PO TABS: 20.0000 mg | ORAL_TABLET | Freq: Every day | ORAL | Status: DC | PRN

## 2020-07-20 MED ORDER — PHENOL 1.4 % MT LIQD: 1.0000 | OROMUCOSAL | Status: DC | PRN

## 2020-07-20 MED ORDER — SODIUM CHLORIDE 0.9% FLUSH: 3.0000 mL | Freq: Two times a day (BID) | INTRAVENOUS | Status: DC

## 2020-07-20 MED ORDER — LIDOCAINE-EPINEPHRINE 1 %-1:100000 IJ SOLN
INTRAMUSCULAR | Status: AC
  Filled 2020-07-20: qty 1

## 2020-07-20 MED ORDER — THROMBIN (RECOMBINANT) 5000 UNITS EX SOLR
CUTANEOUS | Status: AC
  Filled 2020-07-20: qty 5000

## 2020-07-20 MED ORDER — SENNA 8.6 MG PO TABS
1.0000 | ORAL_TABLET | Freq: Two times a day (BID) | ORAL | Status: DC
  Administered 2020-07-20: 8.6 mg via ORAL
  Filled 2020-07-20: qty 1

## 2020-07-20 MED ORDER — CHLORHEXIDINE GLUCONATE 0.12 % MT SOLN
15.0000 mL | Freq: Once | OROMUCOSAL | Status: AC
  Administered 2020-07-20: 15 mL via OROMUCOSAL

## 2020-07-20 MED ORDER — HYDROMORPHONE HCL 1 MG/ML IJ SOLN
INTRAMUSCULAR | Status: AC
  Filled 2020-07-20: qty 0.5

## 2020-07-20 MED ORDER — HYDROMORPHONE HCL 1 MG/ML IJ SOLN
0.2500 mg | INTRAMUSCULAR | Status: DC | PRN
  Administered 2020-07-20 (×2): 0.5 mg via INTRAVENOUS

## 2020-07-20 MED ORDER — HYDROMORPHONE HCL 1 MG/ML IJ SOLN
INTRAMUSCULAR | Status: AC
  Filled 2020-07-20: qty 1

## 2020-07-20 MED ORDER — ALBUMIN HUMAN 5 % IV SOLN: INTRAVENOUS | Status: DC | PRN

## 2020-07-20 MED ORDER — METHOCARBAMOL 500 MG PO TABS
ORAL_TABLET | ORAL | Status: AC
  Filled 2020-07-20: qty 1

## 2020-07-20 MED ORDER — TIZANIDINE HCL 4 MG PO TABS
4.0000 mg | ORAL_TABLET | Freq: Four times a day (QID) | ORAL | Status: DC | PRN
  Administered 2020-07-20: 4 mg via ORAL
  Filled 2020-07-20: qty 1

## 2020-07-20 MED ORDER — GABAPENTIN 300 MG PO CAPS
300.0000 mg | ORAL_CAPSULE | Freq: Four times a day (QID) | ORAL | Status: DC
  Administered 2020-07-20: 300 mg via ORAL
  Filled 2020-07-20: qty 1

## 2020-07-20 MED ORDER — PROMETHAZINE HCL 25 MG PO TABS: 12.5000 mg | ORAL_TABLET | ORAL | Status: DC | PRN

## 2020-07-20 MED ORDER — HYDROMORPHONE HCL 1 MG/ML IJ SOLN
INTRAMUSCULAR | Status: DC | PRN
  Administered 2020-07-20: .5 mg via INTRAVENOUS

## 2020-07-20 MED ORDER — ACETAMINOPHEN 500 MG PO TABS
1000.0000 mg | ORAL_TABLET | Freq: Four times a day (QID) | ORAL | Status: DC
  Administered 2020-07-20 – 2020-07-21 (×3): 1000 mg via ORAL
  Filled 2020-07-20 (×3): qty 2

## 2020-07-20 MED ORDER — AMISULPRIDE (ANTIEMETIC) 5 MG/2ML IV SOLN: 10.0000 mg | Freq: Once | INTRAVENOUS | Status: DC | PRN

## 2020-07-20 MED ORDER — EPHEDRINE 5 MG/ML INJ
INTRAVENOUS | Status: AC
  Filled 2020-07-20: qty 10

## 2020-07-20 MED ORDER — OXYCODONE HCL 5 MG PO TABS
ORAL_TABLET | ORAL | Status: AC
  Filled 2020-07-20: qty 2

## 2020-07-20 MED ORDER — LIDOCAINE-EPINEPHRINE 1 %-1:100000 IJ SOLN
INTRAMUSCULAR | Status: DC | PRN
  Administered 2020-07-20: 4 mL

## 2020-07-20 SURGICAL SUPPLY — 66 items
BAND RUBBER #18 3X1/16 STRL (MISCELLANEOUS) ×4 IMPLANT
BENZOIN TINCTURE PRP APPL 2/3 (GAUZE/BANDAGES/DRESSINGS) IMPLANT
BLADE CLIPPER SURG (BLADE) IMPLANT
BLADE SURG 11 STRL SS (BLADE) ×2 IMPLANT
BLADE ULTRA TIP 2M (BLADE) IMPLANT
BUR MATCHSTICK NEURO 3.0 LAGG (BURR) ×2 IMPLANT
CAGE LORDOTIC 6 SM (Cage) ×4 IMPLANT
CANISTER SUCT 3000ML PPV (MISCELLANEOUS) ×2 IMPLANT
CARTRIDGE OIL MAESTRO DRILL (MISCELLANEOUS) ×1 IMPLANT
COVER WAND RF STERILE (DRAPES) ×2 IMPLANT
DECANTER SPIKE VIAL GLASS SM (MISCELLANEOUS) ×2 IMPLANT
DERMABOND ADVANCED (GAUZE/BANDAGES/DRESSINGS) ×1
DERMABOND ADVANCED .7 DNX12 (GAUZE/BANDAGES/DRESSINGS) ×1 IMPLANT
DEVICE ENDSKLTN TC NANOLCK 6MM (Cage) ×2 IMPLANT
DIFFUSER DRILL AIR PNEUMATIC (MISCELLANEOUS) ×2 IMPLANT
DRAIN CHANNEL 10M FLAT 3/4 FLT (DRAIN) IMPLANT
DRAPE C-ARM 42X72 X-RAY (DRAPES) ×4 IMPLANT
DRAPE HALF SHEET 40X57 (DRAPES) IMPLANT
DRAPE LAPAROTOMY 100X72 PEDS (DRAPES) ×2 IMPLANT
DRAPE MICROSCOPE LEICA (MISCELLANEOUS) ×2 IMPLANT
DRSG OPSITE 4X5.5 SM (GAUZE/BANDAGES/DRESSINGS) ×4 IMPLANT
DRSG OPSITE POSTOP 3X4 (GAUZE/BANDAGES/DRESSINGS) ×2 IMPLANT
DURAPREP 6ML APPLICATOR 50/CS (WOUND CARE) ×2 IMPLANT
ELECT COATED BLADE 2.86 ST (ELECTRODE) ×2 IMPLANT
ELECT REM PT RETURN 9FT ADLT (ELECTROSURGICAL) ×2
ELECTRODE REM PT RTRN 9FT ADLT (ELECTROSURGICAL) ×1 IMPLANT
ENDOSKELETON TC NANOLOCK 6MM (Cage) ×4 IMPLANT
EVACUATOR SILICONE 100CC (DRAIN) IMPLANT
GAUZE 4X4 16PLY RFD (DISPOSABLE) IMPLANT
GLOVE BIO SURGEON STRL SZ7.5 (GLOVE) ×2 IMPLANT
GLOVE BIOGEL PI IND STRL 7.5 (GLOVE) ×2 IMPLANT
GLOVE BIOGEL PI INDICATOR 7.5 (GLOVE) ×2
GLOVE ECLIPSE 7.0 STRL STRAW (GLOVE) ×2 IMPLANT
GLOVE EXAM NITRILE XL STR (GLOVE) IMPLANT
GLOVE SURG UNDER POLY LF SZ6.5 (GLOVE) ×6 IMPLANT
GLOVE SURG UNDER POLY LF SZ7 (GLOVE) ×4 IMPLANT
GOWN STRL REUS W/ TWL LRG LVL3 (GOWN DISPOSABLE) ×4 IMPLANT
GOWN STRL REUS W/ TWL XL LVL3 (GOWN DISPOSABLE) IMPLANT
GOWN STRL REUS W/TWL 2XL LVL3 (GOWN DISPOSABLE) IMPLANT
GOWN STRL REUS W/TWL LRG LVL3 (GOWN DISPOSABLE) ×4
GOWN STRL REUS W/TWL XL LVL3 (GOWN DISPOSABLE)
HEMOSTAT POWDER KIT SURGIFOAM (HEMOSTASIS) ×4 IMPLANT
KIT BASIN OR (CUSTOM PROCEDURE TRAY) ×2 IMPLANT
KIT TURNOVER KIT B (KITS) ×2 IMPLANT
NEEDLE HYPO 22GX1.5 SAFETY (NEEDLE) ×2 IMPLANT
NEEDLE SPNL 22GX3.5 QUINCKE BK (NEEDLE) ×2 IMPLANT
NS IRRIG 1000ML POUR BTL (IV SOLUTION) ×4 IMPLANT
OIL CARTRIDGE MAESTRO DRILL (MISCELLANEOUS) ×2
PACK LAMINECTOMY NEURO (CUSTOM PROCEDURE TRAY) ×2 IMPLANT
PAD ARMBOARD 7.5X6 YLW CONV (MISCELLANEOUS) ×6 IMPLANT
PLATE ZEVO 1LVL 17MM (Plate) ×8 IMPLANT
PUTTY DBF 1CC CORTICAL FIBERS (Putty) ×2 IMPLANT
PUTTY DBF 3CC CORTICAL FIBERS (Putty) ×2 IMPLANT
SCREW 13MM (Screw) ×30 IMPLANT
SCREW 4.0 SELFDRILL 13MM VARI (Screw) ×2 IMPLANT
SPONGE INTESTINAL PEANUT (DISPOSABLE) ×2 IMPLANT
SPONGE SURGIFOAM ABS GEL 100 (HEMOSTASIS) IMPLANT
STRIP CLOSURE SKIN 1/2X4 (GAUZE/BANDAGES/DRESSINGS) IMPLANT
SUT ETHILON 3 0 FSL (SUTURE) IMPLANT
SUT SILK 3 0 TIES 10X30 (SUTURE) ×2 IMPLANT
SUT VIC AB 3-0 SH 8-18 (SUTURE) ×2 IMPLANT
SUT VICRYL 3-0 RB1 18 ABS (SUTURE) ×2 IMPLANT
TAPE CLOTH 3X10 TAN LF (GAUZE/BANDAGES/DRESSINGS) ×2 IMPLANT
TOWEL GREEN STERILE (TOWEL DISPOSABLE) ×2 IMPLANT
TOWEL GREEN STERILE FF (TOWEL DISPOSABLE) ×2 IMPLANT
WATER STERILE IRR 1000ML POUR (IV SOLUTION) ×2 IMPLANT

## 2020-07-20 NOTE — Transfer of Care (Signed)
Immediate Anesthesia Transfer of Care Note  Patient: Stephanie Ewing  Procedure(s) Performed: ANTERIOR CERVICAL DECOMPRESSION FUSION CERVICAL FOUR-CERVICAL FIVE, CERVICAL FIVE-CERVICAL SIX, CERVICAL SIX-CERVICAL SEVEN (N/A Spine Cervical)  Patient Location: PACU  Anesthesia Type:General  Level of Consciousness: drowsy and patient cooperative  Airway & Oxygen Therapy: Patient Spontanous Breathing and Patient connected to face mask oxygen  Post-op Assessment: Report given to RN, Post -op Vital signs reviewed and stable and Patient moving all extremities X 4  Post vital signs: Reviewed  Last Vitals:  Vitals Value Taken Time  BP 126/70 07/20/20 1609  Temp    Pulse 84 07/20/20 1613  Resp 15 07/20/20 1613  SpO2 98 % 07/20/20 1613  Vitals shown include unvalidated device data.  Last Pain:  Vitals:   07/20/20 1010  TempSrc:   PainSc: 6       Patients Stated Pain Goal: 4 (25/74/93 5521)  Complications: No complications documented.

## 2020-07-20 NOTE — Anesthesia Preprocedure Evaluation (Addendum)
Anesthesia Evaluation  Patient identified by MRN, date of birth, ID band Patient awake    Reviewed: Allergy & Precautions, NPO status , Patient's Chart, lab work & pertinent test results  History of Anesthesia Complications (+) PONV  Airway Mallampati: II  TM Distance: >3 FB Neck ROM: Limited    Dental  (+) Teeth Intact   Pulmonary asthma , Current Smoker,    Pulmonary exam normal        Cardiovascular hypertension, Pt. on medications + dysrhythmias (paroyxysmal, off Elliquis 2 years) Atrial Fibrillation  Rhythm:Regular Rate:Normal     Neuro/Psych  Headaches, Depression    GI/Hepatic Neg liver ROS, hiatal hernia, GERD  Medicated,  Endo/Other  Hypothyroidism   Renal/GU negative Renal ROS  negative genitourinary   Musculoskeletal  (+) Arthritis , Osteoarthritis,  Cervical stenosis   Abdominal (+)  Abdomen: soft. Bowel sounds: normal.  Peds  Hematology negative hematology ROS (+)   Anesthesia Other Findings   Reproductive/Obstetrics                            Anesthesia Physical Anesthesia Plan  ASA: II  Anesthesia Plan: General   Post-op Pain Management:    Induction: Intravenous  PONV Risk Score and Plan: 3 and Ondansetron, Dexamethasone, Scopolamine patch - Pre-op, Midazolam, Treatment may vary due to age or medical condition and Amisulpride  Airway Management Planned: Mask and Oral ETT  Additional Equipment: None  Intra-op Plan:   Post-operative Plan: Extubation in OR  Informed Consent: I have reviewed the patients History and Physical, chart, labs and discussed the procedure including the risks, benefits and alternatives for the proposed anesthesia with the patient or authorized representative who has indicated his/her understanding and acceptance.     Dental advisory given  Plan Discussed with: CRNA  Anesthesia Plan Comments: (ECHO 06/21: IMPRESSIONS    1.  Left ventricular ejection fraction, by estimation, is 60 to 65%. The  left ventricle has normal function. The left ventricle has no regional  wall motion abnormalities. Left ventricular diastolic parameters were  normal.  2. Right ventricular systolic function is normal. The right ventricular  size is normal. There is normal pulmonary artery systolic pressure.  3. The mitral valve is normal in structure. Trivial mitral valve  regurgitation. No evidence of mitral stenosis.  4. The aortic valve is tricuspid. Aortic valve regurgitation is moderate.  5. The inferior vena cava is normal in size with greater than 50%  respiratory variability, suggesting right atrial pressure of 3 mmHg.  Lab Results      Component                Value               Date                      WBC                      7.9                 07/13/2020                HGB                      15.9 (H)            07/13/2020  HCT                      49.1 (H)            07/13/2020                MCV                      93.0                07/13/2020                PLT                      254                 07/13/2020           Lab Results      Component                Value               Date                      NA                       137                 07/13/2020                K                        4.5                 07/13/2020                CO2                      26                  07/13/2020                GLUCOSE                  93                  07/13/2020                BUN                      23                  07/13/2020                CREATININE               1.19 (H)            07/13/2020                CALCIUM                  9.8                 07/13/2020                GFRNONAA  51 (L)              07/13/2020          )       Anesthesia Quick Evaluation

## 2020-07-20 NOTE — Anesthesia Postprocedure Evaluation (Signed)
Anesthesia Post Note  Patient: Stephanie Ewing  Procedure(s) Performed: ANTERIOR CERVICAL DECOMPRESSION FUSION CERVICAL FOUR-CERVICAL FIVE, CERVICAL FIVE-CERVICAL SIX, CERVICAL SIX-CERVICAL SEVEN (N/A Spine Cervical)     Patient location during evaluation: PACU Anesthesia Type: General Level of consciousness: awake and alert Pain management: pain level controlled Vital Signs Assessment: post-procedure vital signs reviewed and stable Respiratory status: spontaneous breathing, nonlabored ventilation, respiratory function stable and patient connected to nasal cannula oxygen Cardiovascular status: blood pressure returned to baseline and stable Postop Assessment: no apparent nausea or vomiting Anesthetic complications: no   No complications documented.  Last Vitals:  Vitals:   07/20/20 1641 07/20/20 1722  BP:  137/75  Pulse: 78 78  Resp: (!) 2 18  Temp:  36.7 C  SpO2: 100% 99%    Last Pain:  Vitals:   07/20/20 1641  TempSrc:   PainSc: 4                  Stephanie Ewing

## 2020-07-20 NOTE — Anesthesia Procedure Notes (Signed)
Procedure Name: Intubation Date/Time: 07/20/2020 12:02 PM Performed by: Amadeo Garnet, CRNA Pre-anesthesia Checklist: Patient identified, Emergency Drugs available, Suction available and Patient being monitored Patient Re-evaluated:Patient Re-evaluated prior to induction Oxygen Delivery Method: Circle system utilized Preoxygenation: Pre-oxygenation with 100% oxygen Induction Type: IV induction Ventilation: Mask ventilation without difficulty Laryngoscope Size: 4 and Glidescope Grade View: Grade I Tube type: Oral Number of attempts: 1 Airway Equipment and Method: Stylet,  Oral airway and Video-laryngoscopy Placement Confirmation: ETT inserted through vocal cords under direct vision,  positive ETCO2 and breath sounds checked- equal and bilateral Secured at: 22 cm Tube secured with: Tape Dental Injury: Teeth and Oropharynx as per pre-operative assessment

## 2020-07-20 NOTE — Brief Op Note (Signed)
07/20/2020  3:58 PM  PATIENT:  Stephanie Ewing  65 y.o. female  PRE-OPERATIVE DIAGNOSIS:  CERVICAL STENOSIS OF SPINE  POST-OPERATIVE DIAGNOSIS:  CERVICAL STENOSIS OF SPINE  PROCEDURE:  Procedure(s): ANTERIOR CERVICAL DECOMPRESSION FUSION CERVICAL FOUR-CERVICAL FIVE, CERVICAL FIVE-CERVICAL SIX, CERVICAL SIX-CERVICAL SEVEN (N/A), Cervical seven-Thoracic one  SURGEON:  Surgeon(s) and Role:    * Consuella Lose, MD - Primary  PHYSICIAN ASSISTANT: Ferne Reus, PA-C  ASSISTANTS: none   ANESTHESIA:   general  EBL:  200 mL   BLOOD ADMINISTERED:none  DRAINS: none   LOCAL MEDICATIONS USED:  BUPIVICAINE  and XYLOCAINE   SPECIMEN:  No Specimen  DISPOSITION OF SPECIMEN:  N/A  COUNTS:  YES  TOURNIQUET:  * No tourniquets in log *  DICTATION: .Note written in EPIC  PLAN OF CARE: Admit to inpatient   PATIENT DISPOSITION:  PACU - hemodynamically stable.   Delay start of Pharmacological VTE agent (>24hrs) due to surgical blood loss or risk of bleeding: no

## 2020-07-21 LAB — CBC
HCT: 42.1 % (ref 36.0–46.0)
Hemoglobin: 14 g/dL (ref 12.0–15.0)
MCH: 29.9 pg (ref 26.0–34.0)
MCHC: 33.3 g/dL (ref 30.0–36.0)
MCV: 90 fL (ref 80.0–100.0)
Platelets: 239 10*3/uL (ref 150–400)
RBC: 4.68 MIL/uL (ref 3.87–5.11)
RDW: 13.1 % (ref 11.5–15.5)
WBC: 10.4 10*3/uL (ref 4.0–10.5)
nRBC: 0 % (ref 0.0–0.2)

## 2020-07-21 LAB — BASIC METABOLIC PANEL
Anion gap: 12 (ref 5–15)
BUN: 16 mg/dL (ref 8–23)
CO2: 22 mmol/L (ref 22–32)
Calcium: 9.6 mg/dL (ref 8.9–10.3)
Chloride: 102 mmol/L (ref 98–111)
Creatinine, Ser: 1.14 mg/dL — ABNORMAL HIGH (ref 0.44–1.00)
GFR, Estimated: 53 mL/min — ABNORMAL LOW (ref >60)
Glucose, Bld: 141 mg/dL — ABNORMAL HIGH (ref 70–99)
Potassium: 4.7 mmol/L (ref 3.5–5.1)
Sodium: 136 mmol/L (ref 135–145)

## 2020-07-21 LAB — PROTIME-INR
INR: 1.3 — ABNORMAL HIGH (ref 0.8–1.2)
Prothrombin Time: 15.7 seconds — ABNORMAL HIGH (ref 11.4–15.2)

## 2020-07-21 LAB — APTT: aPTT: 27 seconds (ref 24–36)

## 2020-07-21 MED ORDER — METHOCARBAMOL 750 MG PO TABS: 750.0000 mg | ORAL_TABLET | Freq: Three times a day (TID) | ORAL | 1 refills | Status: DC | PRN

## 2020-07-21 MED ORDER — OXYCODONE-ACETAMINOPHEN 7.5-325 MG PO TABS: 1.0000 | ORAL_TABLET | ORAL | 0 refills | Status: DC | PRN

## 2020-07-21 MED ORDER — ASPIRIN EC 81 MG PO TBEC: 81.0000 mg | DELAYED_RELEASE_TABLET | Freq: Every day | ORAL | 11 refills | Status: AC

## 2020-07-21 MED FILL — Thrombin (Recombinant) For Soln 5000 Unit: CUTANEOUS | Qty: 5000 | Status: AC

## 2020-07-21 NOTE — Discharge Summary (Signed)
Physician Discharge Summary  Patient ID: Stephanie Ewing MRN: [id number] DOB/AGE: 1956/01/17 65 y.o.  Admit date: 07/20/2020 Discharge date: 07/21/2020  Admission Diagnoses:  Cervical radiculopathy  Discharge Diagnoses:  Same Active Problems:   Cervical radiculopathy   Discharged Condition: Stable  Hospital Course:  Stephanie Ewing is a 65 y.o. female who was admitted for the below procedure. There were no post operative complications. At time of discharge, pain was well controlled, ambulating with Pt/OT, tolerating po, voiding normal. Ready for discharge.  Treatments: Surgery - C4-T1 decompression and fusion  Discharge Exam: Blood pressure (!) 94/56, pulse (!) 58, temperature 97.9 F (36.6 C), temperature source Oral, resp. rate 18, height 5\' 7"  (1.702 m), weight 81.6 kg, SpO2 95 %. Awake, alert, oriented Speech fluent, appropriate CN grossly intact 5/5 LUE, BLE Stable RUE motor weakness Wound c/d/i  Disposition: Discharge disposition: 01-Home or Self Care       Discharge Instructions    Call MD for:  difficulty breathing, headache or visual disturbances   Complete by: As directed    Call MD for:  persistant dizziness or light-headedness   Complete by: As directed    Call MD for:  redness, tenderness, or signs of infection (pain, swelling, redness, odor or green/yellow discharge around incision site)   Complete by: As directed    Call MD for:  severe uncontrolled pain   Complete by: As directed    Call MD for:  temperature >100.4   Complete by: As directed    Diet - low sodium heart healthy   Complete by: As directed    Driving Restrictions   Complete by: As directed    Do not drive until given clearance.   Increase activity slowly   Complete by: As directed    Lifting restrictions   Complete by: As directed    Do not lift anything >10lbs. Avoid bending and twisting in awkward positions. Avoid bending at the back.   May shower / Bathe   Complete by: As  directed    In 24 hours. Okay to wash wound with warm soapy water. Avoid scrubbing the wound. Pat dry.   Remove dressing in 24 hours   Complete by: As directed      Allergies as of 07/21/2020   No Known Allergies     Medication List    STOP taking these medications   oxyCODONE 5 MG immediate release tablet Commonly known as: Oxy IR/ROXICODONE   traMADol 50 MG tablet Commonly known as: ULTRAM     TAKE these medications   acetaminophen 325 MG tablet Commonly known as: TYLENOL Take 2 tablets (650 mg total) by mouth every 6 (six) hours. What changed:   when to take this  reasons to take this   aspirin EC 81 MG tablet Take 1 tablet (81 mg total) by mouth at bedtime. Start taking on: July 27, 2020 What changed: These instructions start on July 27, 2020. If you are unsure what to do until then, ask your doctor or other care provider.   Cannabidiol Powd Apply 1 application topically as needed (pain). CBD Oil/CBD Cream   citalopram 20 MG tablet Commonly known as: CELEXA Take 20 mg by mouth at bedtime.   diltiazem 30 MG tablet Commonly known as: Cardizem Take 1 tablet every 4 hours AS NEEDED for heart rate >100 as long as top blood pressure >100. What changed:   how much to take  how to take this  when to take this  additional  instructions   famotidine 20 MG tablet Commonly known as: PEPCID Take 20 mg by mouth daily as needed for indigestion.   fexofenadine 180 MG tablet Commonly known as: ALLEGRA Take 180 mg by mouth daily.   gabapentin 300 MG capsule Commonly known as: NEURONTIN Take 300 mg by mouth 4 (four) times daily.   methocarbamol 750 MG tablet Commonly known as: Robaxin-750 Take 1 tablet (750 mg total) by mouth 3 (three) times daily as needed for muscle spasms.   omeprazole 40 MG capsule Commonly known as: PRILOSEC Take 1 capsule (40 mg total) by mouth 2 (two) times daily before a meal.   ondansetron 4 MG tablet Commonly known as:  ZOFRAN Take 1 tablet (4 mg total) by mouth every 8 (eight) hours as needed for nausea.   oxyCODONE-acetaminophen 7.5-325 MG tablet Commonly known as: Percocet Take 1 tablet by mouth every 4 (four) hours as needed for severe pain.   polyethylene glycol 17 g packet Commonly known as: MiraLax Take 17 g by mouth daily as needed. What changed: when to take this   promethazine 12.5 MG tablet Commonly known as: PHENERGAN Take 12.5 mg by mouth 3 (three) times daily as needed for nausea/vomiting.   Restasis 0.05 % ophthalmic emulsion Generic drug: cycloSPORINE Place 1 drop into both eyes 2 (two) times daily.   sodium chloride 0.65 % Soln nasal spray Commonly known as: OCEAN Place 1 spray into both nostrils as needed for congestion.   spironolactone 100 MG tablet Commonly known as: ALDACTONE Take 100 mg by mouth daily.   Synthroid 125 MCG tablet Generic drug: levothyroxine Take 125 mcg by mouth daily.   tiZANidine 4 MG tablet Commonly known as: ZANAFLEX Take 4 mg by mouth every 6 (six) hours as needed for spasms.   traZODone 100 MG tablet Commonly known as: DESYREL Take 100 mg by mouth at bedtime. Taking with 150 mg = 250 mg at bedtime   traZODone 150 MG tablet Commonly known as: DESYREL Take 150 mg by mouth at bedtime. Taking with 100 mg = 250 mg at bedtime   Vitamin D 50 MCG (2000 UT) Caps Take 2,000 Units by mouth daily.       Follow-up Information    Consuella Lose, MD Follow up.   Specialty: Neurosurgery Contact information: [address] [place] [address] [phone]               Signed: Traci Sermon 07/21/2020, 8:05 AM

## 2020-07-21 NOTE — Plan of Care (Signed)
Patient alert and oriented, voiding adequately, MAE well with no difficulty. Incision area cdi with no s/s of infection. Patient discharged home per order. Patient and husband stated understanding of discharge instructions given. Patient has an appointment with Dr. Kathyrn Sheriff in 2 weeks

## 2020-07-21 NOTE — Progress Notes (Signed)
  NEUROSURGERY PROGRESS NOTE   No issues overnight.  Pre op BUE pain greatly improved Complains of appropriate neck soreness Mild dysphagia, but tolerating diet Voiding normal. Ambulating well Ready for discharge  EXAM:  BP (!) 108/59 (BP Location: Left Arm)   Pulse 62   Temp 97.7 F (36.5 C) (Oral)   Resp 18   Ht 5\' 7"  (1.702 m)   Wt 81.6 kg   SpO2 97%   BMI 28.19 kg/m   Awake, alert, oriented  Speech fluent, appropriate  CN grossly intact  5/5 LUE/BLE, stable RUE weakness Incision: c/d/i  IMPRESSION/PLAN 65 y.o. female POD1 C4-T1 decompression and fusion. Doing well - D/C home

## 2020-07-21 NOTE — Evaluation (Signed)
Physical Therapy Evaluation Patient Details Name: Stephanie Ewing MRN: [id number] DOB: 08-30-1955 Today's Date: 07/21/2020   History of Present Illness  65 y.o. female with several month history of relatively severe right-sided neck and arm pain presenting to [facility] for C4-T1 ACDF on 07/20/2020. PMH includes asthma, Afib, HTN, pancreatitis, hypothyroid.  Clinical Impression  Pt presents to PT s/p ACDF. Pt is mobilizing well, at a modI level currently and is able to ambulate for household distances without assistance. Pt does have some continued numbness and tingling in fingers as well as impairments in fine motor coordination of hands at this time, but is otherwise near her baseline. Pt requires no further acute PT services and is encouraged to ambulate out of the room for at least 3 times a day for the remainder of this admission. PT recommends discharge home with no PT or DME needs. Acute PT signing off.    Follow Up Recommendations No PT follow up    Equipment Recommendations  None recommended by PT    Recommendations for Other Services       Precautions / Restrictions Precautions Precautions: Cervical Precaution Booklet Issued: Yes (comment) Required Braces or Orthoses: Cervical Brace Cervical Brace: Hard collar (orders in chart state "no brace needed" pt in [place] collar upon PT arrival) Restrictions Weight Bearing Restrictions: No      Mobility  Bed Mobility Overal bed mobility: Modified Independent             General bed mobility comments: rolling and sidelying to/from sitting modI    Transfers Overall transfer level: Independent Equipment used: None                Ambulation/Gait Ambulation/Gait assistance: Independent Gait Distance (Feet): 300 Feet Assistive device: None Gait Pattern/deviations: Step-through pattern Gait velocity: functional Gait velocity interpretation: 1.31 - 2.62 ft/sec, indicative of limited community ambulator General Gait  Details: pt with slowed step-through gait, no significant balance or gait deviations noted  Stairs Stairs:  (pt declines the need for stair assessment)          Wheelchair Mobility    Modified Rankin (Stroke Patients Only)       Balance Overall balance assessment: Mild deficits observed, not formally tested                                           Pertinent Vitals/Pain Pain Assessment: Faces Faces Pain Scale: Hurts little more Pain Location: neck Pain Descriptors / Indicators: Sore Pain Intervention(s): Monitored during session    Home Living Family/patient expects to be discharged to:: Private residence Living Arrangements: Spouse/significant other;Other relatives Available Help at Discharge: Family;Available 24 hours/day Type of Home: House Home Access: Stairs to enter Entrance Stairs-Rails: Psychiatric nurse of Steps: 2 Home Layout: Multi-level;Able to live on main level with bedroom/bathroom Home Equipment: None      Prior Function Level of Independence: Independent         Comments: former RT     Hand Dominance        Extremity/Trunk Assessment   Upper Extremity Assessment Upper Extremity Assessment: RUE deficits/detail;LUE deficits/detail RUE Deficits / Details: numbness/tingling in fingers RUE Coordination: decreased fine motor LUE Deficits / Details: numbness/tingling in fingers LUE Coordination: decreased fine motor    Lower Extremity Assessment Lower Extremity Assessment: Overall WFL for tasks assessed    Cervical / Trunk Assessment Cervical / Trunk  Assessment: Other exceptions Cervical / Trunk Exceptions: s/p ACDF  Communication   Communication: No difficulties  Cognition Arousal/Alertness: Awake/alert Behavior During Therapy: WFL for tasks assessed/performed Overall Cognitive Status: Within Functional Limits for tasks assessed                                        General  Comments General comments (skin integrity, edema, etc.): VSS on RA    Exercises     Assessment/Plan    PT Assessment Patent does not need any further PT services  PT Problem List         PT Treatment Interventions      PT Goals (Current goals can be found in the Care Plan section)       Frequency     Barriers to discharge        Co-evaluation               AM-PAC PT "6 Clicks" Mobility  Outcome Measure Help needed turning from your back to your side while in a flat bed without using bedrails?: None Help needed moving from lying on your back to sitting on the side of a flat bed without using bedrails?: None Help needed moving to and from a bed to a chair (including a wheelchair)?: None Help needed standing up from a chair using your arms (e.g., wheelchair or bedside chair)?: None Help needed to walk in hospital room?: None Help needed climbing 3-5 steps with a railing? : None 6 Click Score: 24    End of Session Equipment Utilized During Treatment: Cervical collar Activity Tolerance: Patient tolerated treatment well Patient left: in bed;with call bell/phone within reach Nurse Communication: Mobility status      Time: 0821-0840 PT Time Calculation (min) (ACUTE ONLY): 19 min   Charges:   PT Evaluation $PT Eval Low Complexity: [place], PT, DPT Acute Rehabilitation Pager: [phone]   Zenaida Niece 07/21/2020, 9:09 AM

## 2020-07-21 NOTE — Op Note (Signed)
NEUROSURGERY OPERATIVE NOTE   PREOP DIAGNOSIS: Cervical Spondylosis with radiculopathy, C4-5, C5-6, C6-7, C7-T1  POSTOP DIAGNOSIS: Same  PROCEDURE: 1. Discectomy at C4-5, C5-6, C6-7, C7-T1 for decompression of spinal cord and exiting nerve roots  2. Placement of intervertebral biomechanical device, Medtronic Titan lordotic cages 3. Placement of anterior instrumentation consisting of interbody plate and screws spanning Medtronic Zevo plates, 62mm screws 4. Use of morselized bone allograft  5. Arthrodesis C4-5, C5-6, C6-7, C7-T1, anterior interbody technique  6. Use of intraoperative microscope  SURGEON: Dr. Consuella Lose, MD  ASSISTANT: Ferne Reus, PA-C  ANESTHESIA: General Endotracheal  EBL: 200cc  SPECIMENS: None  DRAINS: None  COMPLICATIONS: None immediate  CONDITION: Hemodynamically stable to PACU  HISTORY: Stephanie Ewing is a 65 y.o. woman initially seen in the outpatient neurosurgery clinic with relatively severe neck and right greater than left arm pain as well as some arm weakness.  Work-up included MRI of the cervical spine revealing multilevel cervical disc disease with varying degrees of foraminal stenosis and nerve root compression.  Given the severity of her pain as well as the presence of motor weakness, surgical decompression was indicated.  The risks, benefits, and alternatives to surgery were all reviewed in detail with the patient.  After all her questions were answered informed consent was obtained and witnessed.  PROCEDURE IN DETAIL: The patient was brought to the operating room and transferred to the operative table. After induction of general anesthesia, the patient was positioned on the operative table in the supine position with all pressure points meticulously padded. The skin of the neck was then prepped and draped in the usual sterile fashion.  After timeout was conducted, the skin was infiltrated with local anesthetic. Skin incision was  then made sharply and Bovie electrocautery was used to dissect the subcutaneous tissue until the platysma was identified. The platysma was then divided and undermined. The sternocleidomastoid muscle was then identified and, utilizing natural fascial planes in the neck, the prevertebral fascia was identified and the carotid sheath was retracted laterally and the trachea and esophagus retracted medially.  The anterior cervical spine was identified and the prevertebral fascia was incised.  Initial spinal needle was introduced into the C5-6 disc space and confirmed with lateral fluoroscopy.  Dissection was then carried inferiorly.  The superior thyroid artery was identified and noted to be coursing over the presumed C7-T1 disc space.  I therefore elected to sacrifice distal vessel using suture ligature.  Once the C7-T1 disc space was identified, it did appear that we had adequate exposure and angulation in order to safely and adequately perform discectomy at this level.  The C7-T1 disc space was incised sharply and rongeurs were use to initially complete a discectomy. The high-speed drill was then used to complete discectomy until the posterior annulus was identified and removed and the posterior longitudinal ligament was identified. Using a nerve hook, the PLL was elevated, and Kerrison rongeurs were used to remove the posterior longitudinal ligament and the ventral thecal sac was identified. Using a combination of curettes and rongeurs, complete decompression of the thecal sac and exiting nerve roots at this level was completed, and verified using micro-nerve hook.  There did appear to be significantly thickened ligament with some free disc fragments within the right foramen which were removed with ball dissectors.  Once this was completed I was able to easily pass a ball dissector within the ventral epidural space and out the foramina bilaterally.  At this point, a Medtronic Titan lordotic interbody  cage was  sized and packed with morcellized bone allograft. This was then inserted and tapped into place, slightly countersunk to the anterior vertebral bodies.  Medtronic's Zevo plate was then selected and placed across the interspace.  This was secured into C7 and T1 with 15 mm screws.  Attention was then turned to the C6-7 level. In a similar fashion, discectomy was completed initially with curettes and rongeurs, and completed with the drill. The PLL was again identified, elevated and incised. Using Kerrison rongeurs, decompression of the spinal cord and exiting roots was completed including removal of thickened posterior longitudinal ligament and posterior osteophytes, and confirmed with a dissector.  A Medtronic Titan interbody cage was then sized and filled with bone allograft, and tapped into place.  Plate was selected and placed across the interspace and secured into C6 and C7 with 15 mm screws.    Attention was then turned to the C5-6 level.  Again, in a similar fashion discectomy was completed initially with curettes and rongeurs, and completed with the drill. The PLL was again identified, elevated and incised. Using Kerrison rongeurs, decompression of the spinal cord and exiting roots was completed including removal of thickened posterior longitudinal ligament and posterior osteophytes, and confirmed with a dissector.  Cage was then selected, packed with morselized bone allograft and tapped into the interspace, slightly countersunk.  Plate was then selected and placed across the C5-6 interspace and secured with 15 mm screws.  Attention was finally turned then to the C4-5 level. Discectomy was completed initially with curettes and rongeurs, and completed with the drill. The PLL was again identified, elevated and incised. Using Kerrison rongeurs, decompression of the spinal cord and exiting roots was completed including removal of thickened posterior longitudinal ligament and posterior osteophytes, and  confirmed with a dissector.  The final graft was selected using trials, packed with morselized bone allograft, and tapped into place again countersunk to the vertebral bodies.  Cervical plate was selected and placed across the C4-5 level and secured with 15 mm screws.  Final lateral fluoroscopic images demonstrated good position of the implanted hardware and good cervical alignment.  At this point, after all counts were verified to be correct, meticulous hemostasis was secured using a combination of bipolar electrocautery and passive hemostatics. The platysma muscle was then closed using interrupted 3-0 Vicryl sutures, and the skin was closed with a interrupted subcuticular stitch. Sterile dressings were then applied and the drapes removed.  The patient tolerated the procedure well and was extubated in the room and taken to the postanesthesia care unit in stable condition.   Consuella Lose, MD [facility]

## 2020-07-21 NOTE — Evaluation (Signed)
Occupational Therapy Evaluation Patient Details Name: Stephanie Ewing MRN: [id number] DOB: 12-20-1955 Today's Date: 07/21/2020    History of Present Illness 65 y.o. female with several month history of relatively severe right-sided neck and arm pain presenting to [facility] for C4-T1 ACDF on 07/20/2020. PMH includes asthma, Afib, HTN, pancreatitis, hypothyroid.   Clinical Impression   PTA patient independent. Admitted for above and limited by problem list below, including impaired functional use, strength, sensation and coordination of B UEs (hands R worse than L), pain and precaution adherence.  Patient currently requires up to min assist for ADLs and supervision for transfers during session.  Educated on precautions, ADL compensatory techniques and recommendations. Pt requires min cueing throughout session to adhere to precautions. She will have good support at home.  Based on performance today, will follow acutely to optimize independence and safety with ADLs, ensure understanding of exercises listed below. Discussed possible need for Outpatient OT after follow up appointment with surgeon to maximize return of R UE function.     Follow Up Recommendations  No OT follow up;Supervision - Intermittent (discussed followup Outpatient OT as needed per surgeon's recommendations)    Equipment Recommendations  None recommended by OT    Recommendations for Other Services       Precautions / Restrictions Precautions Precautions: Cervical Precaution Booklet Issued: Yes (comment) Precaution Comments: reviewed with patient Required Braces or Orthoses: Cervical Brace Cervical Brace: Hard collar (orders in chart state "no brace needd" but pt in [place] collar upon OT arrival) Restrictions Weight Bearing Restrictions: No      Mobility Bed Mobility Overal bed mobility: Modified Independent             General bed mobility comments: rolling and sidelying to/from sitting modI     Transfers Overall transfer level: Needs assistance Equipment used: None Transfers: Sit to/from Stand Sit to Stand: Supervision         General transfer comment: for precaution adherence and posture    Balance Overall balance assessment: Mild deficits observed, not formally tested                                         ADL either performed or assessed with clinical judgement   ADL Overall ADL's : Needs assistance/impaired Eating/Feeding: Set up;Supervision/ safety;Sitting;With adaptive utensils Eating/Feeding Details (indicate cue type and reason): requires assist to open packages, issued red built up foam handles Grooming: Minimal assistance;Standing Grooming Details (indicate cue type and reason): increased effort to manage toothpaste, standing with supervision but min cueing to adhere to precautions Upper Body Bathing: Set up;Sitting   Lower Body Bathing: Minimal assistance;Sit to/from stand Lower Body Bathing Details (indicate cue type and reason): assist with lower legs Upper Body Dressing : Set up;Sitting   Lower Body Dressing: Sit to/from stand;Set up;Supervision/safety;Cueing for compensatory techniques;Cueing for back precautions Lower Body Dressing Details (indicate cue type and reason): cueing for precautions Toilet Transfer: Ambulation;Regular Toilet;Supervision/safety   Toileting- Clothing Manipulation and Hygiene: Supervision/safety;Sit to/from stand   Tub/ Shower Transfer: Supervision/safety;Walk-in shower;Ambulation Tub/Shower Transfer Details (indicate cue type and reason): simulated in room Functional mobility during ADLs: Supervision/safety;Cueing for safety General ADL Comments: patient educated on precautions, brace use, ADL compensatory techniques, recommendations     Vision         Perception     Praxis      Pertinent Vitals/Pain Pain Assessment: Faces Faces Pain Scale:  Hurts little more Pain Location: neck, R UE Pain  Descriptors / Indicators: Sore;Pins and needles;Tightness Pain Intervention(s): Limited activity within patient's tolerance;Monitored during session;Repositioned     Hand Dominance Right   Extremity/Trunk Assessment Upper Extremity Assessment Upper Extremity Assessment: RUE deficits/detail;LUE deficits/detail (R UE with decreased use compared to L UE) RUE Deficits / Details: numbness/tingling in fingers, decreased FMC and strength RUE Sensation: decreased light touch RUE Coordination: decreased fine motor LUE Deficits / Details: numbness/tingling in fingers LUE Sensation: decreased light touch LUE Coordination: decreased fine motor   Lower Extremity Assessment Lower Extremity Assessment: Defer to PT evaluation   Cervical / Trunk Assessment Cervical / Trunk Assessment: Other exceptions Cervical / Trunk Exceptions: s/p ACDF   Communication Communication Communication: No difficulties   Cognition Arousal/Alertness: Awake/alert Behavior During Therapy: WFL for tasks assessed/performed Overall Cognitive Status: Within Functional Limits for tasks assessed                                     General Comments  VSS    Exercises Exercises: Other exercises Other Exercises Other Exercises: Educated on [facility] exercises (x 10 reps tip to tip, ab/addcution), issued tan theraputty and verbalized understanding of exercises (grasp, pinch, pull, rolling, flattening)   Shoulder Instructions      Home Living Family/patient expects to be discharged to:: Private residence Living Arrangements: Spouse/significant other;Other relatives Available Help at Discharge: Family;Available 24 hours/day Type of Home: House Home Access: Stairs to enter [employer] of Steps: 2 Entrance Stairs-Rails: Right;Left Home Layout: Multi-level;Able to live on main level with bedroom/bathroom     Bathroom Shower/Tub: Occupational psychologist: Handicapped height     Home  Equipment: None          Prior Functioning/Environment Level of Independence: Independent        Comments: former RT        OT Problem List: Decreased strength;Decreased activity tolerance;Impaired balance (sitting and/or standing);Pain;Impaired UE functional use;Impaired sensation;Decreased knowledge of precautions;Decreased range of motion;Decreased coordination      OT Treatment/Interventions: Self-care/ADL training;Therapeutic exercise;Neuromuscular education;DME and/or AE instruction;Therapeutic activities;Patient/family education    OT Goals(Current goals can be found in the care plan section) Acute Rehab OT Goals Patient Stated Goal: to use my R hand normally OT Goal Formulation: With patient Time For Goal Achievement: 08/04/20 Potential to Achieve Goals: Good  OT Frequency: Min 2X/week   Barriers to D/C:            Co-evaluation              AM-PAC OT "6 Clicks" Daily Activity     Outcome Measure Help from another person eating meals?: A Little Help from another person taking care of personal grooming?: A Little Help from another person toileting, which includes using toliet, bedpan, or urinal?: A Little Help from another person bathing (including washing, rinsing, drying)?: A Little Help from another person to put on and taking off regular upper body clothing?: A Little Help from another person to put on and taking off regular lower body clothing?: A Little 6 Click Score: 18   End of Session Equipment Utilized During Treatment: Cervical collar Nurse Communication: Mobility status  Activity Tolerance: Patient tolerated treatment well Patient left: with call bell/phone within reach;with chair alarm set;with family/visitor present;Other (comment) (seated EOB)  OT Visit Diagnosis: Other abnormalities of gait and mobility (R26.89);Muscle weakness (generalized) (M62.81);Pain;Other symptoms and signs involving the  nervous system (R29.898) Pain - Right/Left:  Right Pain - part of body: Hand;Arm (neck)                Time: 7510-2585 OT Time Calculation (min): 29 min Charges:  OT General Charges $OT Visit: 1 Visit OT Evaluation $OT Eval Moderate Complexity: 1 Mod OT Treatments $Neuromuscular Re-education: 8-22 mins  Jolaine Artist, OT Acute Rehabilitation Services Pager [phone] Office [phone]   Delight Stare 07/21/2020, 10:29 AM

## 2020-07-22 ENCOUNTER — Encounter (HOSPITAL_COMMUNITY): Payer: Self-pay | Admitting: Neurosurgery

## 2020-08-02 ENCOUNTER — Other Ambulatory Visit (HOSPITAL_COMMUNITY)
Admission: RE | Admit: 2020-08-02 | Discharge: 2020-08-02 | Disposition: A | Discharge status: Home / Self Care | Payer: Medicare Other | Source: Ambulatory Visit | Attending: Gastroenterology | Admitting: Gastroenterology

## 2020-08-02 DIAGNOSIS — Z20822 Contact with and (suspected) exposure to covid-19: Secondary | ICD-10-CM | POA: Insufficient documentation

## 2020-08-02 DIAGNOSIS — Z01812 Encounter for preprocedural laboratory examination: Secondary | ICD-10-CM | POA: Diagnosis present

## 2020-08-02 LAB — SARS CORONAVIRUS 2 (TAT 6-24 HRS): SARS Coronavirus 2: NEGATIVE

## 2020-08-03 ENCOUNTER — Telehealth: Payer: Self-pay | Admitting: Gastroenterology

## 2020-08-03 DIAGNOSIS — R195 Other fecal abnormalities: Secondary | ICD-10-CM

## 2020-08-03 NOTE — Telephone Encounter (Signed)
Please see note below. 

## 2020-08-03 NOTE — Telephone Encounter (Signed)
Why don't we check her LFTs to make sure okay based on description of stool color. I would keep an eye on things, she can try some immodium OTC otherwise. If it persists she should let us know, otherwise will await results of EUS. Thanks

## 2020-08-03 NOTE — Telephone Encounter (Signed)
Inbound call from patient requesting a call back from a nurse please.  She has a procedure scheduled for 08/05/20 but is concerned about recent change in bowel habits.  Please advise.

## 2020-08-03 NOTE — Telephone Encounter (Signed)
Spoke with patient, she reports that for the past 6 days she has been having "kind of diarrhea but not oily." She reports that the stools started off bright yellow and this morning it was "grayish/whiteish", no abdominal pain, reports urgency maybe an hour or so after she eats. Frequency of loose stools on how much she eats, reports about 3-4 episodes formed but loose stools. Patient states that she is usually constipated and wanted to know if she needed to come in for any lab work. She is scheduled for a repeat EUS on 08/05/20 with Dr. Rush Landmark. Please advise, thank you.

## 2020-08-03 NOTE — Telephone Encounter (Addendum)
Spoke with patient in regards to Dr. Doyne Keel recommendations. Patient will come in tomorrow for lab work, will take Imodium as needed for the loose stools. Patient is aware that we will also await the EUS results. Patient verbalized understanding and had no concerns at the end of the call.  Lab order in epic.

## 2020-08-03 NOTE — Telephone Encounter (Signed)
Please send to Dr Ozella Rocks nurse.  Dr Rush Landmark is only doing the EUS for Dr Havery Moros.  Thanks

## 2020-08-04 ENCOUNTER — Other Ambulatory Visit: Payer: Medicare Other

## 2020-08-04 ENCOUNTER — Encounter (HOSPITAL_COMMUNITY): Payer: Self-pay | Admitting: Gastroenterology

## 2020-08-04 DIAGNOSIS — R195 Other fecal abnormalities: Secondary | ICD-10-CM

## 2020-08-04 LAB — HEPATIC FUNCTION PANEL
ALT: 26 U/L (ref 0–35)
AST: 12 U/L (ref 0–37)
Albumin: 4.7 g/dL (ref 3.5–5.2)
Alkaline Phosphatase: 84 U/L (ref 39–117)
Bilirubin, Direct: 0.1 mg/dL (ref 0.0–0.3)
Total Bilirubin: 0.4 mg/dL (ref 0.2–1.2)
Total Protein: 7.4 g/dL (ref 6.0–8.3)

## 2020-08-04 NOTE — Anesthesia Preprocedure Evaluation (Addendum)
Anesthesia Evaluation  Patient identified by MRN, date of birth, ID band Patient awake    Reviewed: Allergy & Precautions, NPO status , Patient's Chart, lab work & pertinent test results  History of Anesthesia Complications Negative for: history of anesthetic complications  Airway Mallampati: II  TM Distance: >3 FB Neck ROM: Full    Dental  (+) Teeth Intact   Pulmonary asthma , Current Smoker,    Pulmonary exam normal        Cardiovascular hypertension, Normal cardiovascular exam+ dysrhythmias Atrial Fibrillation + Valvular Problems/Murmurs AI      Neuro/Psych    GI/Hepatic Neg liver ROS, hiatal hernia, GERD  ,Pancreatic mass   Endo/Other  Hypothyroidism   Renal/GU negative Renal ROS  negative genitourinary   Musculoskeletal  (+) Arthritis ,   Abdominal   Peds  Hematology negative hematology ROS (+)   Anesthesia Other Findings   Reproductive/Obstetrics                            Anesthesia Physical Anesthesia Plan  ASA: III  Anesthesia Plan: MAC   Post-op Pain Management:    Induction: Intravenous  PONV Risk Score and Plan: 2 and Propofol infusion, TIVA and Treatment may vary due to age or medical condition  Airway Management Planned: Natural Airway, Nasal Cannula and Simple Face Mask  Additional Equipment: None  Intra-op Plan:   Post-operative Plan:   Informed Consent: I have reviewed the patients History and Physical, chart, labs and discussed the procedure including the risks, benefits and alternatives for the proposed anesthesia with the patient or authorized representative who has indicated his/her understanding and acceptance.       Plan Discussed with:   Anesthesia Plan Comments: (PAT note written 08/04/2020 by Myra Gianotti, PA-C. )       Anesthesia Quick Evaluation

## 2020-08-04 NOTE — Progress Notes (Signed)
Unable to reach patient via phone.  Left detailed message on machine with instructions for procedure.  PCP - Dr Dorris Singh Cardiologist - Dr Carlyle Dolly Endocrinology - Dr Nicola Girt  Chest x-ray - 05/26/20 (2V) EKG - 05/26/20 Stress Test - 02/05/17 ECHO - 10/29/19 Cardiac Cath - n/a  Aspirin Instructions: Follow your surgeon's instructions on when to stop aspirin prior to surgery,  If no instructions were given by your surgeon then you will need to call the office for those instructions.  Anesthesia review: Yes  STOP now taking any Aspirin (unless otherwise instructed by your surgeon), Aleve, Naproxen, Ibuprofen, Motrin, Advil, Goody's, BC's, all herbal medications, fish oil, and all vitamins.   Coronavirus Screening Covid test on 08/02/20 was negative.

## 2020-08-04 NOTE — Progress Notes (Signed)
Anesthesia Chart Review: SAME DAY WORK-UP    Case: [id number] Date/Time: 08/05/20 1100   Procedures:      ESOPHAGOGASTRODUODENOSCOPY (EGD) WITH PROPOFOL (N/A )     UPPER ENDOSCOPIC ULTRASOUND (EUS) RADIAL (N/A )   Anesthesia type: Monitor Anesthesia Care   Pre-op diagnosis: pancreatic mass   Location: [facility] ROOM 1 / [place] [facility]   Surgeons: Mansouraty, Telford Nab., MD      DISCUSSION: Patient is a 65 year old female scheduled for the above procedure.  History includes smoking, post-operative N/V, afib, HTN, hypothyroidism, pancreatitis, prediabetes, GERD, hiatal hernia, psoriatic arthritis, hyperaldosteronism. S/p C4-5, C5-6, C6-7, C7-T1 ACDF 07/20/20. She had cardiac clearance for her recent ACDF (see 07/08/20 notation by Almyra Deforest, PA).  08/02/2020 preprocedure COVID-19 test negative.  Anesthesia team to evaluate on the day of procedure.   PROVIDERS: Monico Blitz, MD is listed as PCP. Providers per PAT RN interview: PCP - Dr Dorris Singh Cardiologist - Dr Carlyle Dolly Endocrinology - Dr Nicola Girt  LABS:  Lab Results  Component Value Date   WBC 10.4 07/21/2020   HGB 14.0 07/21/2020   HCT 42.1 07/21/2020   PLT 239 07/21/2020   GLUCOSE 141 (H) 07/21/2020   CHOL 156 04/13/2020   TRIG 99 04/13/2020   HDL 57 04/13/2020   LDLCALC 79 04/13/2020   ALT 26 08/04/2020   AST 12 08/04/2020   NA 136 07/21/2020   K 4.7 07/21/2020   CL 102 07/21/2020   CREATININE 1.14 (H) 07/21/2020   BUN 16 07/21/2020   CO2 22 07/21/2020   INR 1.3 (H) 07/21/2020   HGBA1C 6.0 (H) 07/13/2020      IMAGES: CTA Chest/abd/pelvis 05/26/20: IMPRESSION: 1. No aortic dissection or acute aortic abnormality. No pulmonary embolus. 2. Minor distal esophageal wall thickening, can be seen with reflux or esophagitis. 3. Improved peripancreatic fat stranding from last month. 4. Moderate colonic stool burden with tortuous sigmoid colon, suggesting constipation.   EKG: 05/26/20: NSR   CV: Cardiac  event monitor 06/21/20-07/13/20: Preliminary Results: Summary: The patient's monitoring period was 06/21/2020 - 07/13/2020. Baseline sample showed Sinus Rhythm with a heart rate of 94.4 bpm. There were 0 critical, 0 serious, and 5 stable events that occurred. The report analysis of the critical, serious, stable and manually triggered events are listed below. Manually Detected Events: 1 Stable: Sinus Rhythm . Baseline 1 Stable: Sinus Rhythm w/Artifact . None Reported 1 Stable: Sinus Rhythm w/Artifact/Lead Loss . None or Accidental Push 1 Stable: Sinus Rhythm w/PVCs (2 in 1 min) . Flutter or Skipped Beats 1 Stable: Sinus Rhythm w/PVCs (1 in 1 min)/PACs . Flutter or Skipped Beats  Echo 10/29/19 IMPRESSIONS  1. Left ventricular ejection fraction, by estimation, is 60 to 65%. The  left ventricle has normal function. The left ventricle has no regional  wall motion abnormalities. Left ventricular diastolic parameters were  normal.  2. Right ventricular systolic function is normal. The right ventricular  size is normal. There is normal pulmonary artery systolic pressure.  3. The mitral valve is normal in structure. Trivial mitral valve  regurgitation. No evidence of mitral stenosis.  4. The aortic valve is tricuspid. Aortic valve regurgitation is moderate.  5. The inferior vena cava is normal in size with greater than 50%  respiratory variability, suggesting right atrial pressure of 3 mmHg.   Comparison(s): Echocardiogram done 01/18/17 at [facility] showed an EF of 60% with mild to moderate AI.   Past Medical History:  Diagnosis Date  .  Afib ([place])   . Arthritis    psoratic  . Asthma    "weak"   . Constipation   . Depression   . Dysrhythmia    AFIb  . GERD (gastroesophageal reflux disease)   . History of hiatal hernia   . Hyperaldosteronism ([place])   . Hypertension   . Hypokalemia   . Hypothyroidism   . Insomnia   . Migraine    ocassional  . Nicotine  dependence, cigarettes, uncomplicated   . Pancreatitis   . PONV (postoperative nausea and vomiting)   . Pre-diabetes   . Thyroid disease    hypothyroid    Past Surgical History:  Procedure Laterality Date  . ANTERIOR CERVICAL DECOMPRESSION/DISCECTOMY FUSION 4 LEVELS N/A 07/20/2020   Procedure: ANTERIOR CERVICAL DECOMPRESSION FUSION CERVICAL FOUR-CERVICAL FIVE, CERVICAL FIVE-CERVICAL SIX, CERVICAL SIX-CERVICAL SEVEN;  Surgeon: Consuella Lose, MD;  Location: [place];  Service: Neurosurgery;  Laterality: N/A;  . BIOPSY  06/28/2020   Procedure: BIOPSY;  Surgeon: Rush Landmark Telford Nab., MD;  Location: [facility];  Service: Gastroenterology;;  . ESOPHAGOGASTRODUODENOSCOPY (EGD) WITH PROPOFOL N/A 06/28/2020   Procedure: ESOPHAGOGASTRODUODENOSCOPY (EGD) WITH PROPOFOL;  Surgeon: Irving Copas., MD;  Location: [facility];  Service: Gastroenterology;  Laterality: N/A;  . EUS N/A 06/28/2020   Procedure: UPPER ENDOSCOPIC ULTRASOUND (EUS) LINEAR;  Surgeon: Irving Copas., MD;  Location: [facility];  Service: Gastroenterology;  Laterality: N/A;  . FINE NEEDLE ASPIRATION  06/28/2020   Procedure: FINE NEEDLE ASPIRATION (FNA) LINEAR;  Surgeon: Irving Copas., MD;  Location: [facility];  Service: Gastroenterology;;  . POLYPECTOMY  06/28/2020   Procedure: POLYPECTOMY;  Surgeon: Irving Copas., MD;  Location: [facility];  Service: Gastroenterology;;  . SHOULDER SURGERY Bilateral    inpingement release  . TOTAL ABDOMINAL HYSTERECTOMY  2015    MEDICATIONS: No current facility-administered medications for this encounter.   Marland Kitchen acetaminophen (TYLENOL) 325 MG tablet  . aspirin EC 81 MG tablet  . Cannabidiol POWD  . Cholecalciferol (VITAMIN D) 50 MCG (2000 UT) CAPS  . citalopram (CELEXA) 20 MG tablet  . diltiazem (CARDIZEM) 30 MG tablet  . famotidine (PEPCID) 20 MG tablet  . fexofenadine (ALLEGRA) 180 MG tablet  . gabapentin (NEURONTIN) 300 MG capsule  .  methocarbamol (ROBAXIN-750) 750 MG tablet  . omeprazole (PRILOSEC) 40 MG capsule  . ondansetron (ZOFRAN) 4 MG tablet  . oxyCODONE-acetaminophen (PERCOCET) 7.5-325 MG tablet  . polyethylene glycol (MIRALAX) 17 g packet  . promethazine (PHENERGAN) 12.5 MG tablet  . RESTASIS 0.05 % ophthalmic emulsion  . sodium chloride (OCEAN) 0.65 % SOLN nasal spray  . spironolactone (ALDACTONE) 100 MG tablet  . SYNTHROID 125 MCG tablet  . tiZANidine (ZANAFLEX) 4 MG tablet  . traZODone (DESYREL) 100 MG tablet  . traZODone (DESYREL) 150 MG tablet    Myra Gianotti, PA-C Surgical Short Stay/Anesthesiology [facility] Phone [phone] [facility] Phone [phone] 08/04/2020 4:39 PM

## 2020-08-05 ENCOUNTER — Ambulatory Visit (HOSPITAL_COMMUNITY): Payer: Medicare Other | Admitting: Vascular Surgery

## 2020-08-05 ENCOUNTER — Encounter (HOSPITAL_COMMUNITY)
Admission: RE | Disposition: A | Discharge status: Home / Self Care | Payer: Self-pay | Source: Home / Self Care | Attending: Gastroenterology

## 2020-08-05 ENCOUNTER — Ambulatory Visit (HOSPITAL_COMMUNITY)
Admission: RE | Admit: 2020-08-05 | Discharge: 2020-08-05 | Disposition: A | Discharge status: Home / Self Care | Payer: Medicare Other | Attending: Gastroenterology | Admitting: Gastroenterology

## 2020-08-05 ENCOUNTER — Encounter (HOSPITAL_COMMUNITY): Payer: Self-pay | Admitting: Gastroenterology

## 2020-08-05 ENCOUNTER — Other Ambulatory Visit (HOSPITAL_COMMUNITY): Payer: Medicare Other

## 2020-08-05 ENCOUNTER — Other Ambulatory Visit: Payer: Self-pay

## 2020-08-05 DIAGNOSIS — Z7982 Long term (current) use of aspirin: Secondary | ICD-10-CM | POA: Diagnosis not present

## 2020-08-05 DIAGNOSIS — E039 Hypothyroidism, unspecified: Secondary | ICD-10-CM | POA: Diagnosis not present

## 2020-08-05 DIAGNOSIS — K449 Diaphragmatic hernia without obstruction or gangrene: Secondary | ICD-10-CM | POA: Insufficient documentation

## 2020-08-05 DIAGNOSIS — K8020 Calculus of gallbladder without cholecystitis without obstruction: Secondary | ICD-10-CM

## 2020-08-05 DIAGNOSIS — K869 Disease of pancreas, unspecified: Secondary | ICD-10-CM | POA: Diagnosis present

## 2020-08-05 DIAGNOSIS — K859 Acute pancreatitis without necrosis or infection, unspecified: Secondary | ICD-10-CM | POA: Insufficient documentation

## 2020-08-05 DIAGNOSIS — F17210 Nicotine dependence, cigarettes, uncomplicated: Secondary | ICD-10-CM | POA: Diagnosis not present

## 2020-08-05 DIAGNOSIS — Z79899 Other long term (current) drug therapy: Secondary | ICD-10-CM | POA: Diagnosis not present

## 2020-08-05 DIAGNOSIS — K222 Esophageal obstruction: Secondary | ICD-10-CM

## 2020-08-05 DIAGNOSIS — K8010 Calculus of gallbladder with chronic cholecystitis without obstruction: Secondary | ICD-10-CM | POA: Diagnosis not present

## 2020-08-05 DIAGNOSIS — I10 Essential (primary) hypertension: Secondary | ICD-10-CM | POA: Insufficient documentation

## 2020-08-05 DIAGNOSIS — R7303 Prediabetes: Secondary | ICD-10-CM | POA: Diagnosis not present

## 2020-08-05 DIAGNOSIS — K8689 Other specified diseases of pancreas: Secondary | ICD-10-CM

## 2020-08-05 HISTORY — PX: ESOPHAGOGASTRODUODENOSCOPY (EGD) WITH PROPOFOL: SHX5813

## 2020-08-05 HISTORY — PX: FINE NEEDLE ASPIRATION: SHX6590

## 2020-08-05 HISTORY — PX: EUS: SHX5427

## 2020-08-05 SURGERY — ESOPHAGOGASTRODUODENOSCOPY (EGD) WITH PROPOFOL
Anesthesia: Monitor Anesthesia Care

## 2020-08-05 MED ORDER — LIDOCAINE 2% (20 MG/ML) 5 ML SYRINGE
INTRAMUSCULAR | Status: DC | PRN
  Administered 2020-08-05: 40 mg via INTRAVENOUS

## 2020-08-05 MED ORDER — SODIUM CHLORIDE 0.9 % IV SOLN: INTRAVENOUS | Status: DC

## 2020-08-05 MED ORDER — PROPOFOL 10 MG/ML IV BOLUS
INTRAVENOUS | Status: DC | PRN
  Administered 2020-08-05: 30 mg via INTRAVENOUS
  Administered 2020-08-05: 20 mg via INTRAVENOUS

## 2020-08-05 MED ORDER — CIPROFLOXACIN IN D5W 400 MG/200ML IV SOLN
INTRAVENOUS | Status: AC
  Filled 2020-08-05: qty 200

## 2020-08-05 MED ORDER — CIPROFLOXACIN HCL 500 MG PO TABS: 500.0000 mg | ORAL_TABLET | Freq: Two times a day (BID) | ORAL | 0 refills | Status: AC

## 2020-08-05 MED ORDER — CIPROFLOXACIN IN D5W 400 MG/200ML IV SOLN
INTRAVENOUS | Status: DC | PRN
  Administered 2020-08-05: 400 mg via INTRAVENOUS

## 2020-08-05 MED ORDER — PROPOFOL 500 MG/50ML IV EMUL
INTRAVENOUS | Status: DC | PRN
  Administered 2020-08-05: 150 ug/kg/min via INTRAVENOUS

## 2020-08-05 MED ORDER — LACTATED RINGERS IV SOLN: INTRAVENOUS | Status: DC | PRN

## 2020-08-05 SURGICAL SUPPLY — 14 items

## 2020-08-05 NOTE — H&P (Signed)
GASTROENTEROLOGY PROCEDURE H&P NOTE   Primary Care Physician: Monico Blitz, MD  HPI: Stephanie Ewing is a 65 y.o. female who presents for EGD/EUS for evaluation of abnormal pancreas and idiopathic pancreatitis and concern for possible underlying malignancy.  Past Medical History:  Diagnosis Date  . Afib ([place])   . Arthritis    psoratic  . Asthma    "weak"   . Constipation   . Depression   . Dysrhythmia    AFIb  . GERD (gastroesophageal reflux disease)   . History of hiatal hernia   . Hyperaldosteronism ([place])   . Hypertension   . Hypokalemia   . Hypothyroidism   . Insomnia   . Migraine    ocassional  . Nicotine dependence, cigarettes, uncomplicated   . Pancreatitis   . PONV (postoperative nausea and vomiting)   . Pre-diabetes   . Thyroid disease    hypothyroid   Past Surgical History:  Procedure Laterality Date  . ANTERIOR CERVICAL DECOMPRESSION/DISCECTOMY FUSION 4 LEVELS N/A 07/20/2020   Procedure: ANTERIOR CERVICAL DECOMPRESSION FUSION CERVICAL FOUR-CERVICAL FIVE, CERVICAL FIVE-CERVICAL SIX, CERVICAL SIX-CERVICAL SEVEN;  Surgeon: Consuella Lose, MD;  Location: [place];  Service: Neurosurgery;  Laterality: N/A;  . BIOPSY  06/28/2020   Procedure: BIOPSY;  Surgeon: Rush Landmark Telford Nab., MD;  Location: [facility];  Service: Gastroenterology;;  . ESOPHAGOGASTRODUODENOSCOPY (EGD) WITH PROPOFOL N/A 06/28/2020   Procedure: ESOPHAGOGASTRODUODENOSCOPY (EGD) WITH PROPOFOL;  Surgeon: Irving Copas., MD;  Location: [facility];  Service: Gastroenterology;  Laterality: N/A;  . EUS N/A 06/28/2020   Procedure: UPPER ENDOSCOPIC ULTRASOUND (EUS) LINEAR;  Surgeon: Irving Copas., MD;  Location: [facility];  Service: Gastroenterology;  Laterality: N/A;  . FINE NEEDLE ASPIRATION  06/28/2020   Procedure: FINE NEEDLE ASPIRATION (FNA) LINEAR;  Surgeon: Irving Copas., MD;  Location: [facility];  Service: Gastroenterology;;  . POLYPECTOMY  06/28/2020    Procedure: POLYPECTOMY;  Surgeon: Irving Copas., MD;  Location: [facility];  Service: Gastroenterology;;  . SHOULDER SURGERY Bilateral    inpingement release  . TOTAL ABDOMINAL HYSTERECTOMY  2015   Current Facility-Administered Medications  Medication Dose Route Frequency Provider Last Rate Last Admin  . 0.9 %  sodium chloride infusion   Intravenous Continuous Mansouraty, Telford Nab., MD       No Known Allergies Family History  Problem Relation Age of Onset  . Hypertension Mother   . Thyroid disease Mother        hypothyroidism  . Thyroid cancer Cousin   . Asthma Father   . Heart disease Father        from scarlet fever; died age 56 MVA  . Hypertension Sister   . Hypothyroidism Sister   . Hypertension Maternal Grandmother   . Heart failure Maternal Grandmother   . Diabetes Paternal Grandmother   . Heart failure Paternal Grandmother    Social History   Socioeconomic History  . Marital status: Married    Spouse name: Not on file  . Number of children: Not on file  . Years of education: Not on file  . Highest education level: Not on file  Occupational History  . Not on file  Tobacco Use  . Smoking status: Current Every Day Smoker    Packs/day: 0.75    Years: 25.00    Pack years: 18.75    Types: Cigarettes    Start date: 03/12/1971  . Smokeless tobacco: Never Used  Vaping Use  . Vaping Use: Never used  Substance and Sexual Activity  .  Alcohol use: Not Currently  . Drug use: Never  . Sexual activity: Not on file  Other Topics Concern  . Not on file  Social History Narrative  . Not on file   Social Determinants of Health   Financial Resource Strain: Not on file  Food Insecurity: Not on file  Transportation Needs: Not on file  Physical Activity: Not on file  Stress: Not on file  Social Connections: Not on file  Intimate Partner Violence: Not on file    Physical Exam: Vital signs in last 24 hours: Temp:  [97.7 F (36.5 C)] 97.7 F (36.5 C)  (03/24 1018) Pulse Rate:  [72] 72 (03/24 1018) Resp:  [16] 16 (03/24 1018) BP: (155)/(76) 155/76 (03/24 1018) SpO2:  [97 %] 97 % (03/24 1018) Weight:  [78.9 kg] 78.9 kg (03/24 1018)   GEN: NAD EYE: Sclerae anicteric ENT: MMM CV: Non-tachycardic GI: Soft, NT/ND NEURO:  Alert & Oriented x 3  Lab Results: No results for input(s): WBC, HGB, HCT, PLT in the last 72 hours. BMET No results for input(s): NA, K, CL, CO2, GLUCOSE, BUN, CREATININE, CALCIUM in the last 72 hours. LFT Recent Labs    08/04/20 1242  PROT 7.4  ALBUMIN 4.7  AST 12  ALT 26  ALKPHOS 84  BILITOT 0.4  BILIDIR 0.1   PT/INR No results for input(s): LABPROT, INR in the last 72 hours.   Impression / Plan: This is a 65 y.o.female who presents for EGD/EUS for evaluation of abnormal pancreas and idiopathic pancreatitis and concern for possible underlying malignancy.  The risks of an EUS including intestinal perforation, bleeding, infection, aspiration, and medication effects were discussed as was the possibility it may not give a definitive diagnosis if a biopsy is performed.  When a biopsy of the pancreas is done as part of the EUS, there is an additional risk of pancreatitis at the rate of about 1-2%.  It was explained that procedure related pancreatitis is typically mild, although it can be severe and even life threatening, which is why we do not perform random pancreatic biopsies and only biopsy a lesion/area we feel is concerning enough to warrant the risk.  The risks and benefits of endoscopic evaluation were discussed with the patient; these include but are not limited to the risk of perforation, infection, bleeding, missed lesions, lack of diagnosis, severe illness requiring hospitalization, as well as anesthesia and sedation related illnesses.  The patient is agreeable to proceed.    Justice Britain, MD [facility] Office # [phone]

## 2020-08-05 NOTE — Op Note (Signed)
[facility] Patient Name: Stephanie Ewing Procedure Date : 08/05/2020 MRN: [id number] Attending MD: Justice Britain , MD Date of Birth: 1955/06/30 CSN: [id number] Age: 65 Admit Type: Outpatient Procedure:                Upper EUS Indications:              Suspected mass in pancreas on MRCP, Acute recurrent                            pancreatitis Providers:                Justice Britain, MD, Jeanella Cara, RN,                            Cletis Athens, Technician, Trixie Deis, CRNA Referring MD:             [name]. Havery Moros, MD, Ashish C. Manuella Ghazi MD, MD Medicines:                Monitored Anesthesia Care, Cipro mg IV Complications:            No immediate complications. Estimated Blood Loss:     Estimated blood loss was minimal. Procedure:                Pre-Anesthesia Assessment:                           - Prior to the procedure, a History and Physical                            was performed, and patient medications and                            allergies were reviewed. The patient's tolerance of                            previous anesthesia was also reviewed. The risks                            and benefits of the procedure and the sedation                            options and risks were discussed with the patient.                            All questions were answered, and informed consent                            was obtained. Prior Anticoagulants: The patient has                            taken no previous anticoagulant or antiplatelet                            agents except for aspirin. ASA Grade Assessment:  III - A patient with severe systemic disease. After                            reviewing the risks and benefits, the patient was                            deemed in satisfactory condition to undergo the                            procedure.                           After obtaining informed consent, the  endoscope was                            passed under direct vision. Throughout the                            procedure, the patient's blood pressure, pulse, and                            oxygen saturations were monitored continuously. The                            [id number]-[id number] ([id number]) Olympus gastroscope was                            introduced through the mouth, and advanced to the                            second part of duodenum. The [id number] ([id number])                            Olympus Linear EUS scope was introduced through the                            mouth, and advanced to the duodenum for ultrasound                            examination from the stomach and duodenum. The                            upper EUS was accomplished without difficulty. The                            patient tolerated the procedure. Scope In: Scope Out: Findings:      ENDOSCOPIC FINDING: :      No gross lesions were noted in the entire esophagus.      A non-obstructing Schatzki ring was found at the gastroesophageal       junction.      A 2 cm hiatal hernia was present.      No gross lesions were noted in the entire examined stomach.      No gross lesions were noted in the duodenal bulb, in  the first portion       of the duodenum and in the second portion of the duodenum.      ENDOSONOGRAPHIC FINDING: :      An irregular area in the head of the pancreas was noted a small 5 mm by       5 mm cyst was adjacent to this area. This looks different from prior EUS       and mostly has improved appearance overall. The area was slightly       hypoechoic. It measured 12 mm by 13 mm in maximal cross-sectional       diameter. The outer margins were irregular. An intact interface was seen       between the region and the superior mesenteric artery and celiac trunk       suggesting a lack of invasion. The remainder of the pancreas was       examined. The endosonographic appearance of parenchyma and the upstream        pancreatic duct indicated no parenchymal atrophy. Fine needle biopsy was       performed of the area. Color Doppler imaging was utilized prior to       needle puncture to confirm a lack of significant vascular structures       within the needle path. Eight passes were made with the Acquire 22 gauge       ultrasound core biopsy needle using a transduodenal approach. Visible       cores of tissue were obtained. Preliminary cytologic examination and       touch preps were performed. Final cytology results are pending.      Multiple stones were visualized endosonographically in the gallbladder.       The stones were oval. They were hyperechoic and characterized by       shadowing.      There was no sign of significant endosonographic abnormality in the       common bile duct and in the common hepatic duct. The maximum diameter of       the ducts were 6 mm. No stones and no biliary sludge were identified       within the ducts. The ducts are smaller in caliber comparatively to       prior EUS.      Endosonographic imaging in the visualized portion of the liver showed no       mass-lesion.      No malignant-appearing lymph nodes were visualized in the celiac region       (level 20), peripancreatic region and porta hepatis region.      The celiac region was visualized. Impression:               EGD Impression:                           - No gross lesions in esophagus. Non-obstructing                            Schatzki ring.                           - 2 cm hiatal hernia.                           - No gross lesions in the  stomach.                           - No gross lesions in the duodenal bulb, in the                            first portion of the duodenum and in the second                            portion of the duodenum.                           EUS Impression:                           - A region of hypointensity was identified in the                            pancreatic  head. This region looks improved and                            smaller than prior procedure. Because of previous                            atypical cells noted on last EUS FNB, tissue was                            obtained from this exam, and results are pending to                            try to rule out a malignant process. However, the                            endosonographic appearance today, with improving                            features, seems to make the possibility of                            inflammatory changes more possible though                            malignancy is not ruled out as of yet. If this                            turns out to be a malignant process, then this will                            be staged T1 N0 Mx by endosonographic criteria.                            Fine needle biopsy performed.                           -  Multiple stones were visualized                            endosonographically in the gallbladder.                           - There was no sign of significant pathology in the                            common bile duct and in the common hepatic duct                            (smaller than previous EUS).                           - No malignant-appearing lymph nodes were                            visualized in the celiac region (level 20),                            peripancreatic region and porta hepatis region. Recommendation:           - The patient will be observed post-procedure,                            until all discharge criteria are met.                           - Discharge patient to home.                           - Patient has a contact number available for                            emergencies. The signs and symptoms of potential                            delayed complications were discussed with the                            patient. Return to normal activities tomorrow.                            Written  discharge instructions were provided to the                            patient.                           - Low fat diet.                           - Observe patient's clinical course.                           -  Await cytology results.                           - Obtain CA19-9 today.                           - Ciprofloxacin 500 mg twice daily x 3-days to                            decrease risk of post-infectious EUS complications.                           - Monitor for signs/symptoms of bleeding,                            perforation, and infection. If issues please call                            our number to get further assistance as needed.                           - Consideration if final pathology is unremarkable                            that we repeat imaging with a Pancreas Protocol CT.                            She has evidence of gallstones. If the pathology                            and CT are unremarkable for continued notation of a                            mass or lesion then query going ahead with                            cholecystectomy as this could be etiology for her                            recurrent pancreatitis episodes.                           - The findings and recommendations were discussed                            with the patient.                           - The findings and recommendations were discussed                            with the patient's family. Procedure Code(s):        --- Professional ---  43238, Esophagogastroduodenoscopy, flexible,                            transoral; with transendoscopic ultrasound-guided                            intramural or transmural fine needle                            aspiration/biopsy(s), (includes endoscopic                            ultrasound examination limited to the esophagus,                            stomach or duodenum, and adjacent structures) Diagnosis  Code(s):        --- Professional ---                           K22.2, Esophageal obstruction                           K44.9, Diaphragmatic hernia without obstruction or                            gangrene                           K86.89, Other specified diseases of pancreas                           K80.20, Calculus of gallbladder without                            cholecystitis without obstruction                           I89.9, Noninfective disorder of lymphatic vessels                            and lymph nodes, unspecified                           K85.90, Acute pancreatitis without necrosis or                            infection, unspecified                           R93.3, Abnormal findings on diagnostic imaging of                            other parts of digestive tract CPT copyright 2019 [employer]. All rights reserved. The codes documented in this report are preliminary and upon coder review may  be revised to meet current compliance requirements. Justice Britain, MD 08/05/2020 12:47:54 PM Number of Addenda: 0

## 2020-08-05 NOTE — Anesthesia Procedure Notes (Signed)
Procedure Name: MAC Date/Time: 08/05/2020 10:55 AM Performed by: Leonor Liv, CRNA Pre-anesthesia Checklist: Patient identified, Emergency Drugs available, Suction available, Patient being monitored and Timeout performed Patient Re-evaluated:Patient Re-evaluated prior to induction Oxygen Delivery Method: Nasal cannula Airway Equipment and Method: Bite block Placement Confirmation: positive ETCO2 Dental Injury: Teeth and Oropharynx as per pre-operative assessment

## 2020-08-05 NOTE — Anesthesia Postprocedure Evaluation (Signed)
Anesthesia Post Note  Patient: Stephanie Ewing  Procedure(s) Performed: ESOPHAGOGASTRODUODENOSCOPY (EGD) WITH PROPOFOL (N/A ) UPPER ENDOSCOPIC ULTRASOUND (EUS) RADIAL (N/A ) FINE NEEDLE ASPIRATION     Patient location during evaluation: Endoscopy Anesthesia Type: MAC Level of consciousness: awake and alert Pain management: pain level controlled Vital Signs Assessment: post-procedure vital signs reviewed and stable Respiratory status: spontaneous breathing, nonlabored ventilation and respiratory function stable Cardiovascular status: blood pressure returned to baseline and stable Postop Assessment: no apparent nausea or vomiting Anesthetic complications: no   No complications documented.  Last Vitals:  Vitals:   08/05/20 1245 08/05/20 1300  BP: (!) 149/77 (!) 175/74  Pulse: 63 61  Resp: 15 17  Temp:    SpO2: 100% 100%    Last Pain:  Vitals:   08/05/20 1300  TempSrc:   PainSc: 0-No pain                 Lidia Collum

## 2020-08-05 NOTE — Transfer of Care (Signed)
Immediate Anesthesia Transfer of Care Note  Patient: Stephanie Ewing  Procedure(s) Performed: ESOPHAGOGASTRODUODENOSCOPY (EGD) WITH PROPOFOL (N/A ) UPPER ENDOSCOPIC ULTRASOUND (EUS) RADIAL (N/A ) FINE NEEDLE ASPIRATION  Patient Location: PACU and Endoscopy Unit  Anesthesia Type:MAC  Level of Consciousness: drowsy, patient cooperative and responds to stimulation  Airway & Oxygen Therapy: Patient Spontanous Breathing  Post-op Assessment: Report given to RN and Post -op Vital signs reviewed and stable  Post vital signs: Reviewed and stable  Last Vitals:  Vitals Value Taken Time  BP    Temp    Pulse    Resp    SpO2      Last Pain:  Vitals:   08/05/20 1018  TempSrc: Oral  PainSc: 4          Complications: No complications documented.

## 2020-08-05 NOTE — Discharge Instructions (Signed)
YOU HAD AN ENDOSCOPIC PROCEDURE TODAY: Refer to the procedure report and other information in the discharge instructions given to you for any specific questions about what was found during the examination. If this information does not answer your questions, please call [place] office at [phone] to clarify.   YOU SHOULD EXPECT: Some feelings of bloating in the abdomen. Passage of more gas than usual. Walking can help get rid of the air that was put into your GI tract during the procedure and reduce the bloating. If you had a lower endoscopy (such as a colonoscopy or flexible sigmoidoscopy) you may notice spotting of blood in your stool or on the toilet paper. Some abdominal soreness may be present for a day or two, also.  DIET: Your first meal following the procedure should be a light meal and then it is ok to progress to your normal diet. A half-sandwich or bowl of soup is an example of a good first meal. Heavy or fried foods are harder to digest and may make you feel nauseous or bloated. Drink plenty of fluids but you should avoid alcoholic beverages for 24 hours. If you had a esophageal dilation, please see attached instructions for diet.    ACTIVITY: Your care partner should take you home directly after the procedure. You should plan to take it easy, moving slowly for the rest of the day. You can resume normal activity the day after the procedure however YOU SHOULD NOT DRIVE, use power tools, machinery or perform tasks that involve climbing or major physical exertion for 24 hours (because of the sedation medicines used during the test).   SYMPTOMS TO REPORT IMMEDIATELY: A gastroenterologist can be reached at any hour. Please call [phone]  for any of the following symptoms:   Following upper endoscopy (EGD, EUS, ERCP, esophageal dilation) Vomiting of blood or coffee ground material  New, significant abdominal pain  New, significant chest pain or pain under the shoulder blades  Painful or  persistently difficult swallowing  New shortness of breath  Black, tarry-looking or red, bloody stools  FOLLOW UP:  If any biopsies were taken you will be contacted by phone or by letter within the next 1-3 weeks. Call [phone]  if you have not heard about the biopsies in 3 weeks.  Please also call with any specific questions about appointments or follow up tests.  

## 2020-08-06 LAB — CYTOLOGY - NON PAP

## 2020-08-06 LAB — CANCER ANTIGEN 19-9: CA 19-9: 5 U/mL (ref 0–35)

## 2020-08-07 ENCOUNTER — Encounter (HOSPITAL_COMMUNITY): Payer: Self-pay | Admitting: Gastroenterology

## 2020-08-09 ENCOUNTER — Telehealth: Payer: Self-pay | Admitting: Gastroenterology

## 2020-08-09 DIAGNOSIS — K8689 Other specified diseases of pancreas: Secondary | ICD-10-CM

## 2020-08-09 DIAGNOSIS — K859 Acute pancreatitis without necrosis or infection, unspecified: Secondary | ICD-10-CM

## 2020-08-09 NOTE — Telephone Encounter (Signed)
Inbound call from patient requesting a call back please in regards to getting a referral from [place] to [facility].  Please advise.

## 2020-08-09 NOTE — Telephone Encounter (Signed)
Pt states she would like a referral to [facility] to have her pancreas evaluated. States she has had 2 EGD's done but would like to see someone at [facility]. Please advise.

## 2020-08-10 NOTE — Telephone Encounter (Signed)
Stephanie Ewing I am going to call this patient and try to understand her request, but wanted to get your thoughts on her FNA results and what you recommend moving forward in regards to follow up for her pancreas, prior to me calling her. Thanks

## 2020-08-11 ENCOUNTER — Encounter: Payer: Self-pay | Admitting: Gastroenterology

## 2020-08-11 NOTE — Telephone Encounter (Signed)
Gabe thanks very much, appreciate the follow up. I will call her later today and get back to you. I can relay the plan as you outlined. Thanks again for your help, will let you know what she wants to do

## 2020-08-11 NOTE — Telephone Encounter (Signed)
Called and spoke with patient. Discussed results. Discussed thoughts of further imaging and [place] discussion and if imaging concerning then potential need for repeat EUS at [facility] center. Patient appreciates thoroughness but would like a second opinion as well. I have more thoughts now that we have cholelithiasis that this is etiology of recurrent issues, but having atypical cells on 2 EUS FNBs does still need to be monitored closely.  I spoke with [facility] RN Triage and they can help with getting set up a second opinion this afternoon. Please place referral to [place] [facility] Recurrent Pancreatitis and Abnormal imaging. I want to obtain a Pancreas Protocol CT-Abdomen next week (by Thursday or Friday) so that her clinic visit at [facility] within the next few weeks we can have the image PowerShared so they can evaluate and view it to help them determine potential need for repeat EUS. I will hold on [place] discussion since patient will seek 2nd opinion at this time.  [place] or [place], Please place referral to [facility]. Please schedule Pancreas Protocol CT-Abdomen - dx pancreatic mass, recurrent pancreatitis and have this completed by end of next week.  Thanks to all.  FYI Dr. Havery Moros

## 2020-08-11 NOTE — Telephone Encounter (Signed)
Tried calling but left voicemail.

## 2020-08-11 NOTE — Telephone Encounter (Signed)
Gabe thanks very much for your time spent with her and coordinating this, I appreciate it

## 2020-08-11 NOTE — Telephone Encounter (Signed)
SA, I reviewed her results on Tuesday evening. Overall, the area in question based on my EUS looks improved but there was still an abnormality present which led me to go ahead and biopsy the region again. The atypical nature of the cells that are present still carries some concern but now that things look improved via EUS I have a bit less concern for underlying malignancy.  Also, the finding of overt gallstones on my EUS suggest this is a potential other cause for her recurrent bouts of pancreatitis. However as I discussed with her and her husband, if there was not a overt diagnosis of malignancy additional imaging and discussion would likely be necessary. I had suggested the potential of moving forward with a CT pancreas protocol abdomen to evaluate the pancreas more closely and then likely a discussion in multidisciplinary conference. I am happy to try and touch base with the patient later this afternoon after I am done with outpatient hospital procedures and outpatient procedures as well. If she is interested in being evaluated at one of the quaternary centers since we have two EUS attempts that show atypical cells without a clear diagnosis, I do not think that that is unreasonable either as repeat EUS imaging after I have done 2 separate EUS procedures should be performed, if necessary, by a different endoscopist to ensure best opportunity at try not to miss a lesion.   I now have more suspicion that we may have another reason for her pancreatitis and with the area looking somewhat improved I am hopeful that this is just inflammation driven but certainly if she feels she needs another opinion/view of the area she certainly should be able to have that. Let me know if you are going to reach out to her before I do later this afternoon and happy in either case for her to move forward with whatever and however she wants to have her care. Thanks. GM

## 2020-08-11 NOTE — Telephone Encounter (Signed)
I just called the patient as well, no answer, left message, will call back

## 2020-08-13 NOTE — Telephone Encounter (Signed)
Referral, records, demographic and insurance information faxed to [facility] at [phone]. Patient is scheduled for an appt on April 12th with Roseanne Reno at [place].  Patient has been scheduled for CT abdomen and pelvis W and WO contrast (pancreatic protocol) at [facility] on Monday, at 1:30 PM, arrive at 1:15 PM. NPO 4 hours. Patient will drink water based contrast at [facility] right before scan.  Patient is aware of all appt information and instructions, she verbalized understanding and had no concerns at the end of the call.

## 2020-08-13 NOTE — Addendum Note (Signed)
Addended by: Yevette Edwards on: 08/13/2020 11:13 AM   Modules accepted: Orders

## 2020-08-16 ENCOUNTER — Ambulatory Visit (HOSPITAL_COMMUNITY)
Admission: RE | Admit: 2020-08-16 | Discharge: 2020-08-16 | Disposition: A | Discharge status: Home / Self Care | Payer: Medicare Other | Source: Ambulatory Visit | Attending: Gastroenterology | Admitting: Gastroenterology

## 2020-08-16 ENCOUNTER — Encounter (HOSPITAL_COMMUNITY): Payer: Self-pay | Admitting: Radiology

## 2020-08-16 DIAGNOSIS — K8689 Other specified diseases of pancreas: Secondary | ICD-10-CM | POA: Insufficient documentation

## 2020-08-16 DIAGNOSIS — K859 Acute pancreatitis without necrosis or infection, unspecified: Secondary | ICD-10-CM | POA: Insufficient documentation

## 2020-08-16 IMAGING — CT CT ABD-PEL WO/W CM
2 of 12 series · 11 of 46 positions shown, 17 images · IV contrast (omnipaque)
Comparison: MRI on [DATE], and previous CTs on [DATE] and
[DATE]

CLINICAL DATA: Recurrent pancreatitis. Follow-up pancreatic lesion.

EXAM:
CT ABDOMEN AND PELVIS WITHOUT AND WITH CONTRAST
TECHNIQUE: Multidetector CT imaging of the abdomen and pelvis was performed
following the standard protocol before and following the bolus
administration of intravenous contrast.
CONTRAST:  100mL OMNIPAQUE IOHEXOL 300 MG/ML  SOLN

[Series 7: coronal arterial · coronal · arterial · 0.56mm/px · 3 of 105 slices shown, 4 images]
[im 27/105  soft-tissue]
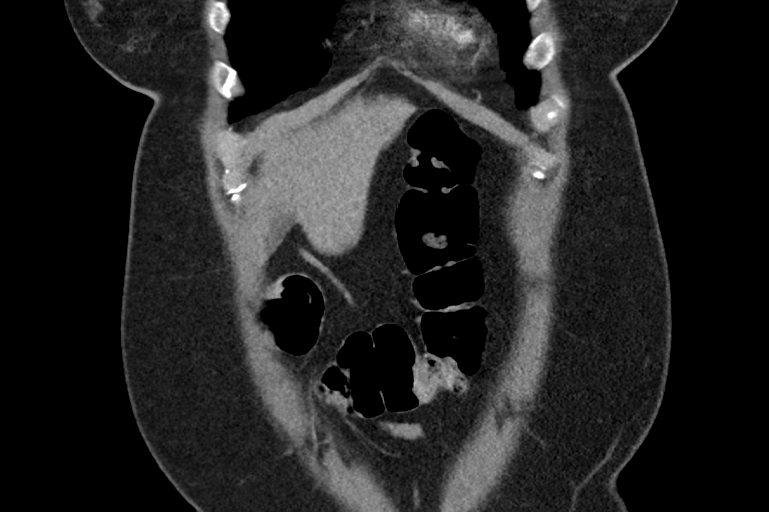
[im 53/105  soft-tissue]
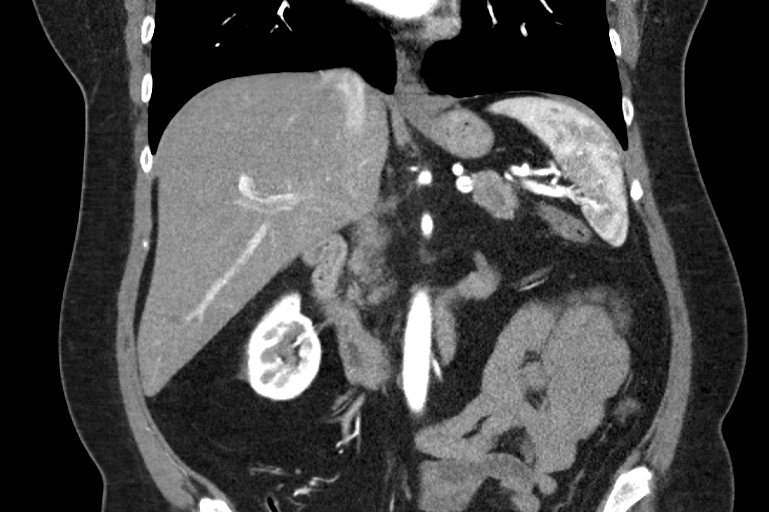
[im 53/105  bone]
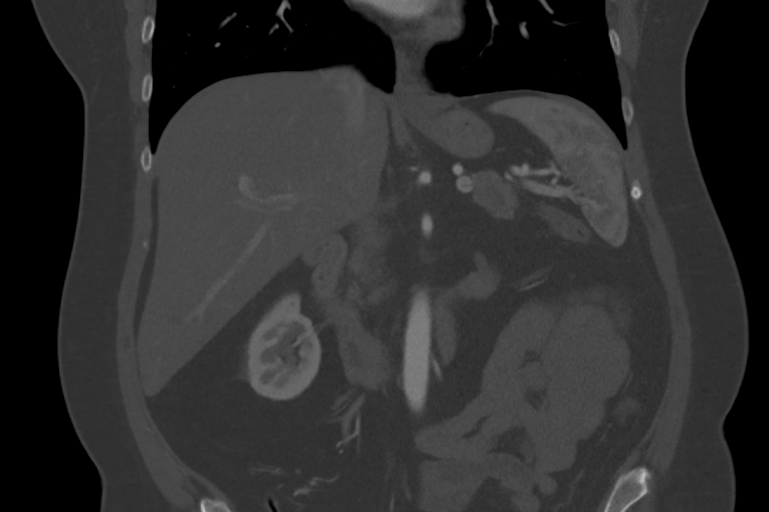
[im 79/105  soft-tissue]
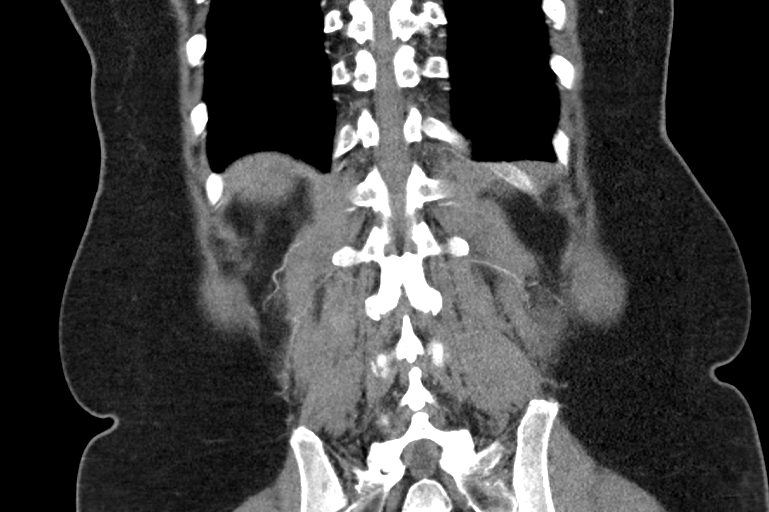

[Series 11: pancreas pv 2.0 · axial · portal-venous · 0.83mm/px · z∈[+832,+1202]mm · 8 of 239 slices shown, 13 images]
[im 27/239  soft-tissue]
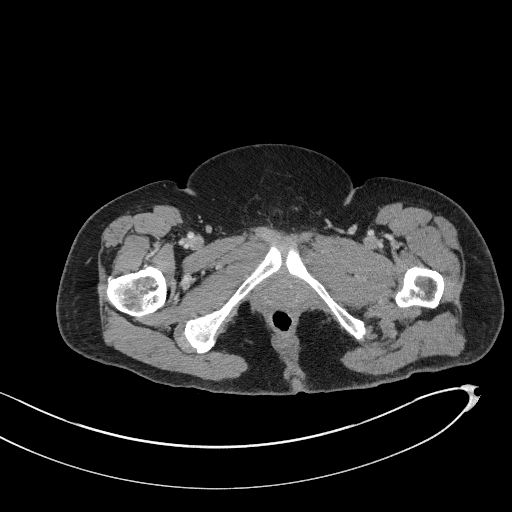
[im 27/239  bone]
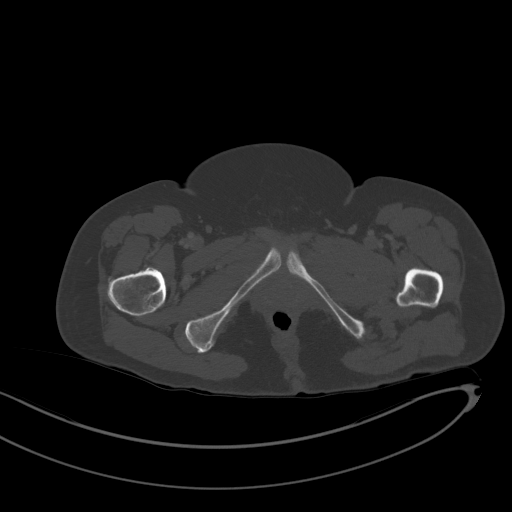
[im 53/239  soft-tissue]
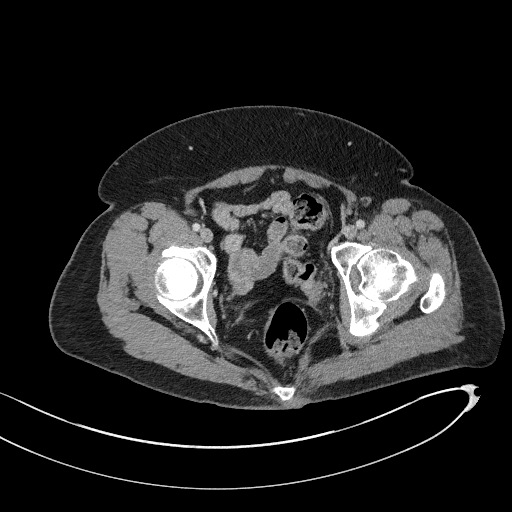
[im 80/239  soft-tissue]
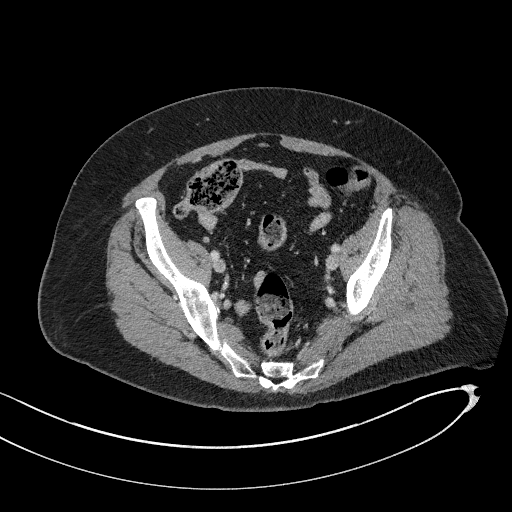
[im 106/239  soft-tissue]
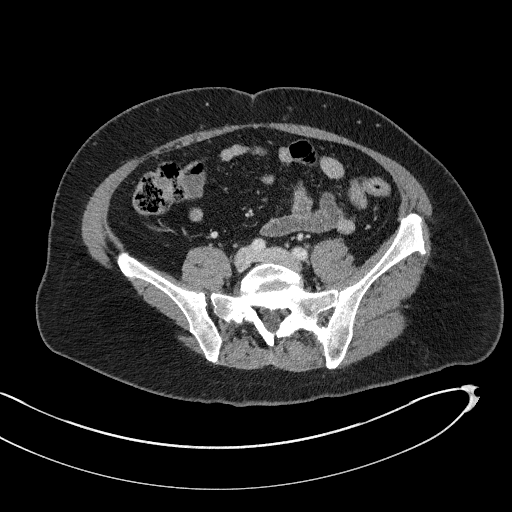
[im 133/239  soft-tissue]
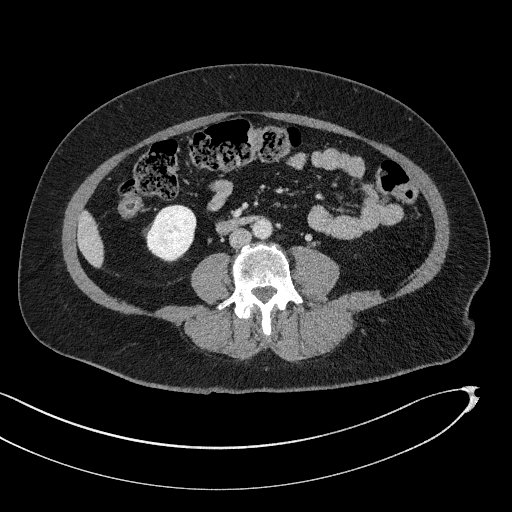
[im 133/239  lung]
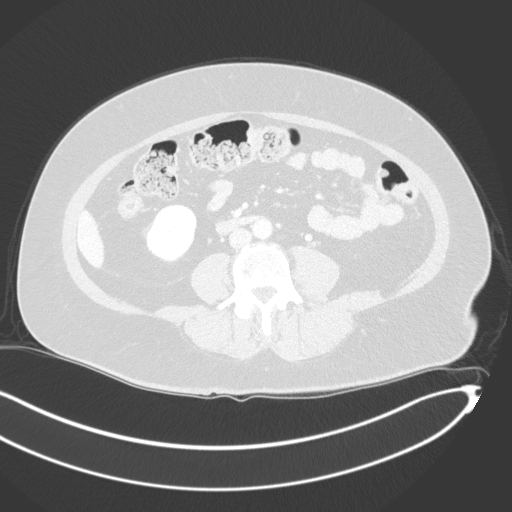
[im 159/239  soft-tissue]
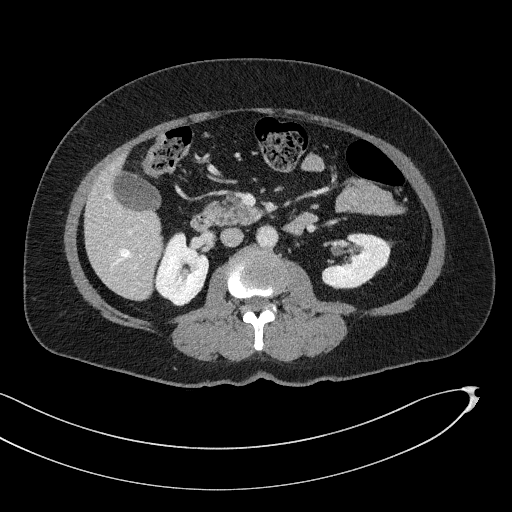
[im 159/239  lung]
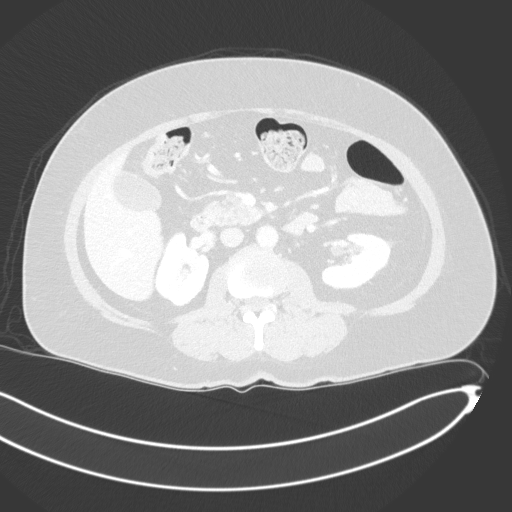
[im 186/239  soft-tissue]
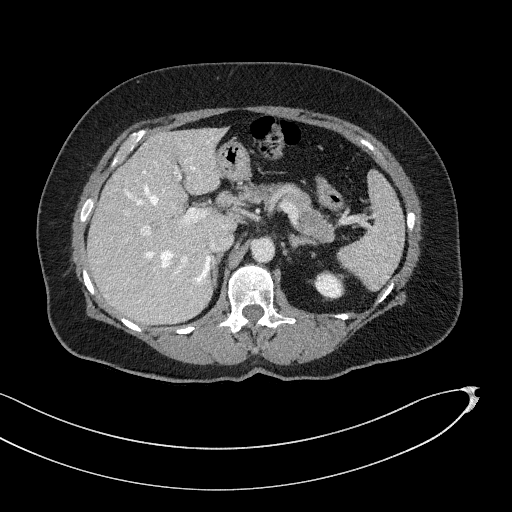
[im 186/239  lung]
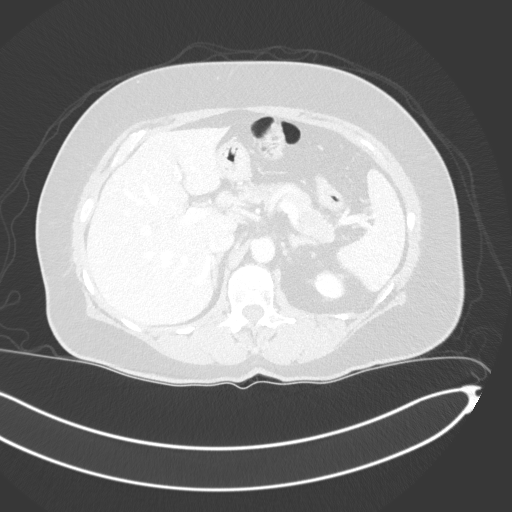
[im 212/239  soft-tissue]
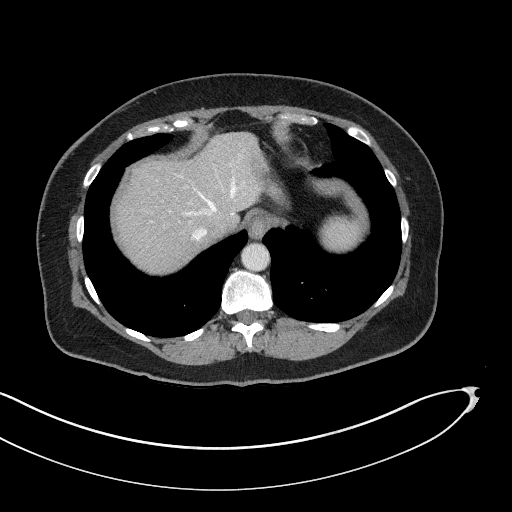
[im 212/239  lung]
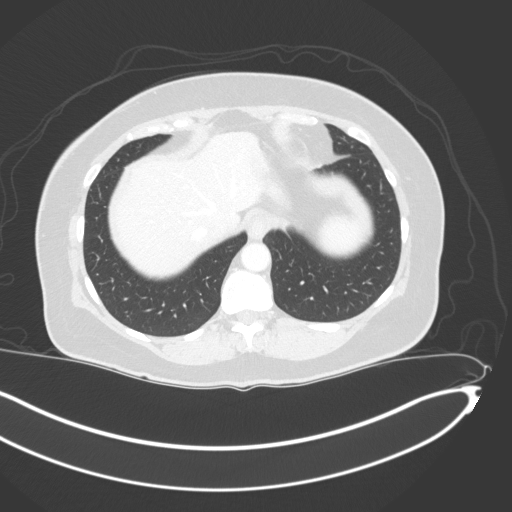

[11 of 46 positions shown; findings below may reference images not displayed]

FINDINGS: Lower Chest: No acute findings.

Hepatobiliary: No hepatic masses identified. Gallbladder is
unremarkable. No evidence of biliary ductal dilatation.

Pancreas: Decreased soft tissue prominence is seen in the pancreatic
head. Mild peripancreatic stranding seen on prior CTs has resolved.
No discrete pancreatic mass is visualized. No evidence of pancreatic
ductal dilatation or peripancreatic fluid collections.

Spleen: Within normal limits in size and appearance.

Adrenals/Urinary Tract: No masses identified. No evidence of
ureteral calculi or hydronephrosis.

Stomach/Bowel: No evidence of obstruction, inflammatory process or
abnormal fluid collections. Normal appendix visualized.

Vascular/Lymphatic: No pathologically enlarged lymph nodes. No
abdominal aortic aneurysm.

Reproductive: Prior hysterectomy noted. Adnexal regions are
unremarkable in appearance.

Other:  None.

Musculoskeletal:  No suspicious bone lesions identified.
IMPRESSION: Mildly decreased soft tissue prominence in pancreatic head, most
consistent with resolving focal pancreatitis. No focal pancreatic
mass visualized. Recommend continued follow-up by abdomen CT in 3
months.

## 2020-08-16 MED ORDER — IOHEXOL 300 MG/ML  SOLN
100.0000 mL | Freq: Once | INTRAMUSCULAR | Status: AC | PRN
  Administered 2020-08-16: 100 mL via INTRAVENOUS

## 2020-08-23 ENCOUNTER — Encounter: Payer: Self-pay | Admitting: Cardiology

## 2020-08-23 ENCOUNTER — Ambulatory Visit (INDEPENDENT_AMBULATORY_CARE_PROVIDER_SITE_OTHER): Payer: Medicare Other | Admitting: Cardiology

## 2020-08-23 VITALS — BP 140/90 | HR 84 | Ht 67.0 in | Wt 178.4 lb

## 2020-08-23 DIAGNOSIS — I480 Paroxysmal atrial fibrillation: Secondary | ICD-10-CM | POA: Diagnosis not present

## 2020-08-23 DIAGNOSIS — I10 Essential (primary) hypertension: Secondary | ICD-10-CM

## 2020-08-23 DIAGNOSIS — I351 Nonrheumatic aortic (valve) insufficiency: Secondary | ICD-10-CM | POA: Diagnosis not present

## 2020-08-23 NOTE — Patient Instructions (Signed)
Your physician recommends that you schedule a follow-up appointment in: 6 MONTHS WITH DR BRANCH  Your physician recommends that you continue on your current medications as directed. Please refer to the Current Medication list given to you today.  Thank you for choosing [facility]!!    

## 2020-08-23 NOTE — Progress Notes (Signed)
Clinical Summary Stephanie Ewing is a 65 y.o.female seen today for follow up of the following medical problems.  Previously followed by Dr Ruel Favors at [facility]   1. PAF - beta blocker stopped by endocrinology  CHADS2Vasc score (HTN, woman)2, has been on eliquis.  - she reports 2 episodes of afib in setting of hypokalemia.From chart review appears K was low to mid 2 range both times.  - 02/2017 event monitor without afib/aflutter, did have some PACs/PVCs, short runs SVT. - anticoag was stopped due to isolated events with no clear recurrence    06/2020 event monitor SR and rare PVCs, no afib or aflutter - no recent palpitations - has dilt just prn, has not had to use   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 No evidence of renal artery stenosis by [place]   - some chronic pain. Home bps 120s/80s   3. Hyperaldosteronism - followed at [facility]. Recs for medical management after renal vein renin sampling did not localize - aldactone increased to 100mg  daily - still followed at [facility]   4. Leg tremors - being worked up at [facility] by neurology   5. Mild to moderate AI - noted by echo 2018   -10/2019 echo LVEF 60-65%, mod AI - no symptoms    6. 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 Lexiscan nuclear stress test was normal. 02/05/2017 nuclear stress test: Lexiscan nuclear stress tes tNo significant EKG changes. Perfusion scan is normal with a normal ejection fraction and no ischemia. This is considered low risk result   Retired respiratory therapist    Past Medical History:  Diagnosis  Date  . Afib ([place])   . Arthritis    psoratic  . Asthma    "weak"   . Constipation   . Depression   . Dysrhythmia    AFIb  . GERD (gastroesophageal reflux disease)   . History of hiatal hernia   . Hyperaldosteronism ([place])   . Hypertension   . Hypokalemia   . Hypothyroidism   . Insomnia   . Migraine    ocassional  . Nicotine dependence, cigarettes, uncomplicated   . Pancreatitis   . PONV (postoperative nausea and vomiting)   . Pre-diabetes   . Thyroid disease    hypothyroid     No Known Allergies   Current Outpatient Medications  Medication Sig Dispense Refill  . acetaminophen (TYLENOL) 325 MG tablet Take 2 tablets (650 mg total) by mouth every 6 (six) hours. (Patient taking differently: Take 650 mg by mouth every 6 (six) hours as needed for mild pain.)    . aspirin EC 81 MG tablet Take 1 tablet (81 mg total) by mouth at bedtime. 30 tablet 11  . Cannabidiol POWD Apply 1 application topically as needed (pain). CBD Oil/CBD Cream    . Cholecalciferol (VITAMIN D) 50 MCG (2000 UT) CAPS Take 2,000 Units by mouth daily.    . citalopram (CELEXA) 20 MG tablet Take 20 mg by mouth at bedtime.    Marland Kitchen diltiazem (CARDIZEM) 30 MG tablet Take 1 tablet every 4 hours AS NEEDED for heart rate >100 as long as top blood pressure >100. (Patient taking differently: Take  30 mg by mouth as directed. Take 1 tablet (30mg )  every 4 hours AS NEEDED for heart rate >100 as long as top blood pressure >100.) 45 tablet 1  . famotidine (PEPCID) 20 MG tablet Take 20 mg by mouth daily as needed for indigestion.     . fexofenadine (ALLEGRA) 180 MG tablet Take 180 mg by mouth daily.    Marland Kitchen gabapentin (NEURONTIN) 300 MG capsule Take 300 mg by mouth 4 (four) times daily.    . methocarbamol (ROBAXIN-750) 750 MG tablet Take 1 tablet (750 mg total) by mouth 3 (three) times daily as needed for muscle spasms. 90 tablet 1  . omeprazole (PRILOSEC) 40 MG capsule Take 1 capsule (40 mg total) by mouth 2 (two) times daily before a  meal. 60 capsule 5  . ondansetron (ZOFRAN) 4 MG tablet Take 1 tablet (4 mg total) by mouth every 8 (eight) hours as needed for nausea. 30 tablet 0  . oxyCODONE-acetaminophen (PERCOCET) 7.5-325 MG tablet Take 1 tablet by mouth every 4 (four) hours as needed for severe pain. 60 tablet 0  . polyethylene glycol (MIRALAX) 17 g packet Take 17 g by mouth daily as needed. (Patient taking differently: Take 17 g by mouth every other day.) 14 each 0  . promethazine (PHENERGAN) 12.5 MG tablet Take 12.5 mg by mouth 3 (three) times daily as needed for nausea/vomiting.    . RESTASIS 0.05 % ophthalmic emulsion Place 1 drop into both eyes 2 (two) times daily.    . sodium chloride (OCEAN) 0.65 % SOLN nasal spray Place 1 spray into both nostrils as needed for congestion.    Marland Kitchen spironolactone (ALDACTONE) 100 MG tablet Take 100 mg by mouth daily.    Marland Kitchen SYNTHROID 125 MCG tablet Take 125 mcg by mouth daily.    Marland Kitchen tiZANidine (ZANAFLEX) 4 MG tablet Take 4 mg by mouth every 6 (six) hours as needed for spasms.    . traZODone (DESYREL) 100 MG tablet Take 100 mg by mouth at bedtime. Taking with 150 mg = 250 mg at bedtime    . traZODone (DESYREL) 150 MG tablet Take 150 mg by mouth at bedtime. Taking with 100 mg = 250 mg at bedtime     No current facility-administered medications for this visit.     Past Surgical History:  Procedure Laterality Date  . ANTERIOR CERVICAL DECOMPRESSION/DISCECTOMY FUSION 4 LEVELS N/A 07/20/2020   Procedure: ANTERIOR CERVICAL DECOMPRESSION FUSION CERVICAL FOUR-CERVICAL FIVE, CERVICAL FIVE-CERVICAL SIX, CERVICAL SIX-CERVICAL SEVEN;  Surgeon: Consuella Lose, MD;  Location: [place];  Service: Neurosurgery;  Laterality: N/A;  . BIOPSY  06/28/2020   Procedure: BIOPSY;  Surgeon: Rush Landmark Telford Nab., MD;  Location: [facility];  Service: Gastroenterology;;  . ESOPHAGOGASTRODUODENOSCOPY (EGD) WITH PROPOFOL N/A 06/28/2020   Procedure: ESOPHAGOGASTRODUODENOSCOPY (EGD) WITH PROPOFOL;  Surgeon: Irving Copas., MD;  Location: [facility];  Service: Gastroenterology;  Laterality: N/A;  . ESOPHAGOGASTRODUODENOSCOPY (EGD) WITH PROPOFOL N/A 08/05/2020   Procedure: ESOPHAGOGASTRODUODENOSCOPY (EGD) WITH PROPOFOL;  Surgeon: Rush Landmark Telford Nab., MD;  Location: [place];  Service: Gastroenterology;  Laterality: N/A;  . EUS N/A 06/28/2020   Procedure: UPPER ENDOSCOPIC ULTRASOUND (EUS) LINEAR;  Surgeon: Irving Copas., MD;  Location: [facility];  Service: Gastroenterology;  Laterality: N/A;  . EUS N/A 08/05/2020   Procedure: UPPER ENDOSCOPIC ULTRASOUND (EUS) RADIAL;  Surgeon: Rush Landmark Telford Nab., MD;  Location: [place];  Service: Gastroenterology;  Laterality: N/A;  . FINE NEEDLE ASPIRATION  06/28/2020   Procedure: FINE NEEDLE ASPIRATION (FNA) LINEAR;  Surgeon: Irving Copas., MD;  Location: [facility];  Service: Gastroenterology;;  . FINE NEEDLE ASPIRATION  08/05/2020   Procedure: FINE NEEDLE ASPIRATION;  Surgeon: Rush Landmark Telford Nab., MD;  Location: [place];  Service: Gastroenterology;;  . POLYPECTOMY  06/28/2020   Procedure: POLYPECTOMY;  Surgeon: Irving Copas., MD;  Location: [facility];  Service: Gastroenterology;;  . SHOULDER SURGERY Bilateral    inpingement release  . TOTAL ABDOMINAL HYSTERECTOMY  2015     No Known Allergies    Family History  Problem Relation Age of Onset  . Hypertension Mother   . Thyroid disease Mother        hypothyroidism  . Thyroid cancer Cousin   . Asthma Father   . Heart disease Father        from scarlet fever; died age 25 MVA  . Hypertension Sister   . Hypothyroidism Sister   . Hypertension Maternal Grandmother   . Heart failure Maternal Grandmother   . Diabetes Paternal Grandmother   . Heart failure Paternal Grandmother      Social History Ms. Dowe reports that she has been smoking cigarettes. She started smoking about 49 years ago. She has a 18.75 pack-year smoking history. She has never  used smokeless tobacco. Ms. Sadiq reports previous alcohol use.   Review of Systems CONSTITUTIONAL: No weight loss, fever, chills, weakness or fatigue.  HEENT: Eyes: No visual loss, blurred vision, double vision or yellow sclerae.No hearing loss, sneezing, congestion, runny nose or sore throat.  SKIN: No rash or itching.  CARDIOVASCULAR: per hpi RESPIRATORY: No shortness of breath, cough or sputum.  GASTROINTESTINAL: No anorexia, nausea, vomiting or diarrhea. No abdominal pain or blood.  GENITOURINARY: No burning on urination, no polyuria NEUROLOGICAL: No headache, dizziness, syncope, paralysis, ataxia, numbness or tingling in the extremities. No change in bowel or bladder control.  MUSCULOSKELETAL: No muscle, back pain, joint pain or stiffness.  LYMPHATICS: No enlarged nodes. No history of splenectomy.  PSYCHIATRIC: No history of depression or anxiety.  ENDOCRINOLOGIC: No reports of sweating, cold or heat intolerance. No polyuria or polydipsia.  Marland Kitchen   Physical Examination Today's Vitals   08/23/20 1415  BP: 140/90  Pulse: 84  SpO2: 98%  Weight: 178 lb 6.4 oz (80.9 kg)  Height: 5\' 7"  (1.702 m)   Body mass index is 27.94 kg/m.  Gen: resting comfortably, no acute distress HEENT: no scleral icterus, pupils equal round and reactive, no palptable cervical adenopathy,  CV: RRR, no m/r/g, no jvd Resp: Clear to auscultation bilaterally GI: abdomen is soft, non-tender, non-distended, normal bowel sounds, no hepatosplenomegaly MSK: extremities are warm, no edema.  Skin: warm, no rash Neuro:  no focal deficits Psych: appropriate affect   Diagnostic Studies 9/2018Nuclear perfusion stress test: 1. Normal myocardial perfusion imaging without ischemia or infarction. 2. Normal post-stress LVEF, 55%. Normal regional wall motion. TID 0.93. 3. Patient achieved 10 METs by Bruce protocol without chest pain or EKG changes but could not reach heart rate target. With vasodilator  administration, no EKG changes. Patient reported chest pain with vasodilator, a non-specific finding. 4. Hypertensive response to exercise. 5. No prior study available for comparison. Electronically signed Dr Hurshel Party, MD 02/06/17 9:35 AM  9/2018Echocardiogram: Mild sclerosis of the trileaflet aortic valve, with adequate leaflet motion. Mild-to-moderate aortic regurgitation is present. The left atrial size is normal. There is normal left ventricular size. Global left ventricular systolic function is normal. The estimated left ventricular ejection fraction is 60% Normal left ventricular diastolic filling pattern. Normal  right ventricle size and function. Normal RVSP ---------------------------------------------------------------- Electronically signed by Ruel Favors W(Interpreting physician) on 01/18/2017 06:58 PM ----------------------------------------------------------------  10/2018Thirty day event monitor: 1. Ventricular arrhythmias occasional PVCs with no couplets triplets or runs of ventricular tachycardia 2. Supraventricular arrhythmias occasional PACs with occasional short runs of nonsustained SVT 3. Atrial fibrillation/atrial flutter none 4. Bradycardia arrhythmias no significant Brady arrhythmias 5. Symptom correlation with arrhythmias patient's symptoms did occur with atrial and ventricular ectopy and short runs of nonsustained SVT  Impressions: Occasional PVCs with no high-grade ventricular ectopy Occasional PACs with occasional brief short runs of nonsustained SVT Symptoms occurred with PACs and PVCs and short runs of nonsustained SVT Overall no hemodynamically significant arrhythmias present  Electronically signed by: Holly Bodily, DO, [facility]    Jan 2022 CTA IMPRESSION: 1. No aortic dissection or acute aortic abnormality. No pulmonary embolus. 2. Minor distal esophageal wall thickening, can be seen with reflux or esophagitis. 3. Improved  peripancreatic fat stranding from last month. 4. Moderate colonic stool burden with tortuous sigmoid colon, suggesting constipation.      Assessment and Plan  1. PAF - 2 isolated episodes of afib in setting of severe hypokalemia. No clear recurence, including by a 30 day event monitor by her previous cardiologist and a repeat monitor here in 06/2020 - she is off anticoag, if clear recurrence would need to restart  2. Resistant HTN - significnatly improved on aldactone, she was diagnosed with hyperaldo -home bp's at goal, continue current management  3. Aortic regurgitation - moderate by last echo, repeat later this year         Arnoldo Lenis, M.D.

## 2020-09-07 ENCOUNTER — Telehealth: Payer: Self-pay | Admitting: Gastroenterology

## 2020-09-07 NOTE — Telephone Encounter (Signed)
Lm on vm for patient to return call 

## 2020-09-07 NOTE — Telephone Encounter (Signed)
Okay; thanks.

## 2020-09-07 NOTE — Telephone Encounter (Signed)
Records arrived from [facility].  She had a second opinion there for abnormal EUS.  She had another EUS done by Dr. Francella Solian on 09/02/2020.  She had a few cystic lesions in the pancreatic body and pancreatic neck and some hyperechoic strands.  The main pancreatic duct was normal as well as the CBD.  She had stones and sludge in the gallbladder.  They do not see any mass lesion of the pancreas at this time which is good and reassuring.  She can follow-up with [place] in time for surveillance of her pancreatic cysts, however given her pancreatitis with gallstones noted in her gallbladder, I think referral to general surgery for consideration of cholecystectomy is reasonable.  [place] can you please contact the patient and let her know I reviewed her records from [place], her doctors here and at [facility] are recommending referral to general surgery for consideration of cholecystectomy, can you refer her to [facility] general surgery if she is willing to consider this.  Thanks otherwise she can follow-up with me after she has her gallbladder removed.

## 2020-09-07 NOTE — Telephone Encounter (Signed)
Spoke with patient, she states that [facility] has already referred her to a Psychologist, sport and exercise. She is scheduled to see Dr. Selinda Eon on 10/21/20. Patient will follow up with [place] after that, she wanted me to let Dr. Havery Moros know that she will be back.

## 2020-10-05 NOTE — Telephone Encounter (Signed)
Inbound call from patient requesting a call from a nurse please.  Wants to know if referral can be sent to another practice.  Please advise.

## 2020-10-05 NOTE — Telephone Encounter (Signed)
Spoke with patient, she states that she went to [facility] for a second opinion but they keep switching her doctors. Patient is requesting that referral for cholecystectomy be sent to [facility]. Advised that I will get everything faxed over today and [facility] will contact her directly to schedule her appt. Advised patient to call and let us know if she has not heard from them within 2 weeks. Patient verbalized understanding and had no concerns at the end of the call.  Referral, records, demographic and insurance faxed to [facility].

## 2020-12-10 NOTE — Patient Instructions (Signed)
DUE TO COVID-19 ONLY ONE VISITOR IS ALLOWED TO COME WITH YOU AND STAY IN THE WAITING ROOM ONLY DURING PRE OP AND PROCEDURE DAY OF SURGERY. THE 1 VISITOR  MAY VISIT WITH YOU AFTER SURGERY IN YOUR PRIVATE ROOM DURING VISITING HOURS ONLY!               Stephanie Ewing   Your procedure is scheduled on: 12/30/20   Report to [facility] Main  Entrance   Report to admitting at : 8:00 AM     Call this number if you have problems the morning of surgery [phone]    Remember: Do not eat solid food :After Midnight. Clear liquids until: 7:00 AM.  CLEAR LIQUID DIET  Foods Allowed                                                                     Foods Excluded  Coffee and tea, regular and decaf                             liquids that you cannot  Plain Jell-O any favor except red or purple                                           see through such as: Fruit ices (not with fruit pulp)                                     milk, soups, orange juice  Iced Popsicles                                    All solid food Carbonated beverages, regular and diet                                    Cranberry, grape and apple juices Sports drinks like Gatorade Lightly seasoned clear broth or consume(fat free) Sugar, honey syrup  Sample Menu Breakfast                                Lunch                                     Supper Cranberry juice                    Beef broth                            Chicken broth Jell-O  Grape juice                           Apple juice Coffee or tea                        Jell-O                                      Popsicle                                                Coffee or tea                        Coffee or tea  _____________________________________________________________________  BRUSH YOUR TEETH MORNING OF SURGERY AND RINSE YOUR MOUTH OUT, NO CHEWING GUM CANDY OR MINTS.    Take these medicines the morning of  surgery with A SIP OF WATER: gabapentin,benadryl,allegra,synthroid,omeprazole.Use inhalers as usual.Diltiazem as needed.                               You may not have any metal on your body including hair pins and              piercings  Do not wear jewelry, make-up, lotions, powders or perfumes, deodorant             Do not wear nail polish on your fingernails.  Do not shave  48 hours prior to surgery.    Do not bring valuables to the hospital. [place].  Contacts, dentures or bridgework may not be worn into surgery.  Leave suitcase in the car. After surgery it may be brought to your room.     Patients discharged the day of surgery will not be allowed to drive home. IF YOU ARE HAVING SURGERY AND GOING HOME THE SAME DAY, YOU MUST HAVE AN ADULT TO DRIVE YOU HOME AND BE WITH YOU FOR 24 HOURS. YOU MAY GO HOME BY TAXI OR UBER OR ORTHERWISE, BUT AN ADULT MUST ACCOMPANY YOU HOME AND STAY WITH YOU FOR 24 HOURS.  Name and phone number of your driver:  Special Instructions: N/A              Please read over the following fact sheets you were given: _____________________________________________________________________           [facility] - Preparing for Surgery Before surgery, you can play an important role.  Because skin is not sterile, your skin needs to be as free of germs as possible.  You can reduce the number of germs on your skin by washing with CHG (chlorahexidine gluconate) soap before surgery.  CHG is an antiseptic cleaner which kills germs and bonds with the skin to continue killing germs even after washing. Please DO NOT use if you have an allergy to CHG or antibacterial soaps.  If your skin becomes reddened/irritated stop using the CHG and inform your nurse when you arrive at Short Stay. Do not shave (including legs and underarms) for at least 48 hours  prior to the first CHG shower.  You may shave your face/neck. Please follow these  instructions carefully:  1.  Shower with CHG Soap the night before surgery and the  morning of Surgery.  2.  If you choose to wash your hair, wash your hair first as usual with your  normal  shampoo.  3.  After you shampoo, rinse your hair and body thoroughly to remove the  shampoo.                           4.  Use CHG as you would any other liquid soap.  You can apply chg directly  to the skin and wash                       Gently with a scrungie or clean washcloth.  5.  Apply the CHG Soap to your body ONLY FROM THE NECK DOWN.   Do not use on face/ open                           Wound or open sores. Avoid contact with eyes, ears mouth and genitals (private parts).                       Wash face,  Genitals (private parts) with your normal soap.             6.  Wash thoroughly, paying special attention to the area where your surgery  will be performed.  7.  Thoroughly rinse your body with warm water from the neck down.  8.  DO NOT shower/wash with your normal soap after using and rinsing off  the CHG Soap.                9.  Pat yourself dry with a clean towel.            10.  Wear clean pajamas.            11.  Place clean sheets on your bed the night of your first shower and do not  sleep with pets. Day of Surgery : Do not apply any lotions/deodorants the morning of surgery.  Please wear clean clothes to the hospital/surgery center.  FAILURE TO FOLLOW THESE INSTRUCTIONS MAY RESULT IN THE CANCELLATION OF YOUR SURGERY PATIENT SIGNATURE_________________________________  NURSE SIGNATURE__________________________________  ________________________________________________________________________

## 2020-12-10 NOTE — Progress Notes (Signed)
Pt. Needs orders for upcoming surgery.PAT and labs on 12/14/20.

## 2020-12-11 ENCOUNTER — Ambulatory Visit: Payer: Self-pay | Admitting: Surgery

## 2020-12-11 NOTE — H&P (Signed)
CC: Referred for probable biliary pancreatitis  HPI: [name] is a very pleasant 65yoF with hx of hyperaldosteronism, hypothyroidism who has been diagnosed with recurrent pancreatitis-twice in the last 7 months.  With these attacks she reports severe burning midepigastric pain and last on the order of days and will bring her to the hospital.  She has been seen and evaluated by gastroenterology.  RUQ [place] 04/13/20 - unremarkable in appearance.  CBD 7 mm.  No gallstones or wall thickening was seen. CT abdomen/pelvis with contrast 05/06/20-mild hazy peripancreatic inflammation of the pancreatic head consistent with focal pancreatitis. CTA chest PE protocol 05/09/20-no pulmonary artery embolus.  Findings concerning for acute pancreatitis. RUQ [place] 05/24/20 - apparent polyps along the gallbladder wall largest measuring 4 mm.  No wall thickening or pericholecystic fluid.  Increased liver echogenicity consistent with hepatic steatosis.  No liver lesions. CTA Chest/Abd/pelvis 05/26/20 -  no aortic dissection.  No PE.  Improved peripancreatic fat stranding.  Moderate stool burden. MRCP 06/23/20-findings are highly concerning for a possible primary pancreatic neoplasm in the inferior aspect of the pancreatic head measuring 3.4 x 2.2 x 2.4 cm.  This exerts mild mass effect upon the adjacent distal pancreatic duct and distal common bile duct.  Possibility of focal pancreatitis is not excluded however.  Multiple tiny subcentimeter cystic appearing lesions scattered elsewhere throughout the pancreas.  Repeat MRI/MRCP recommended at 1 year. CT abdomen/pelvis with and without contrast 08/17/20-mildly decreased soft tissue prominence and pancreatic head most consistent with resolving focal pancreatitis.  No focal pancreatic mass visualized.  Recommend follow-up CT in 3 months.  She has been following with GI and is also seeing physicians at [facility].  She had an EUS completed with Dr. Francella Solian 09/02/20 per chart review.  She diffuse  cystic lesions in the pancreatic body and neck.  Main pancreatic duct was normal as well as the CBD.  She had stones and sludge noted in her gallbladder.  They did not see any mass lesion of the pancreas at that time.  She is following with gastroenterology who referred her to see [place] for consideration of cholecystectomy given the findings of sludge and stones.  She is currently doing well without any symptoms.  She denies any abdominal pain, nausea, vomiting, diarrhea, or weight loss.  PMH: Hyperaldosteronism, hypothyroidism  PSH: BTL; robotic hysterectomy and [place] for menorrhagia  FHx: Denies FHx of colorectal, breast, endometrial, ovarian or cervical cancer  Social: Denies use of EtOH/drugs. Smokes 1 pack per day. She is a retried respiratory therapist. She is here with her husband.  ROS: A comprehensive 10 system review of systems was completed with the patient and pertinent findings as noted above.  The patient is a 65 year old female.   Past Surgical History Stephanie Ewing, CMA; 10/25/2020 8:55 AM) Colon Polyp Removal - Colonoscopy   Hysterectomy (due to cancer) - Complete   Hysterectomy (not due to cancer) - Complete   Oral Surgery   Shoulder Surgery   Bilateral. Spinal Surgery - Neck    Diagnostic Studies History Stephanie Ewing, CMA; 10/25/2020 8:55 AM) Colonoscopy   1-5 years ago Mammogram   within last year Pap Smear   >5 years ago  Allergies Stephanie Ewing, CMA; 10/25/2020 8:55 AM) No Known Drug Allergies   [10/25/2020]: Allergies Reconciled    Medication History Stephanie Ewing, CMA; 10/25/2020 8:59 AM) oxyCODONE HCl  ('5MG'$  Tablet, Oral) Active. traZODone HCl  ('150MG'$  Tablet, Oral) Active. Aspirin Low Dose  ('81MG'$  Tablet DR, Oral) Active. Gabapentin  ('300MG'$   Capsule, Oral) Active. golimumab  Active. Golimumab  ('50MG'$ /4ML Solution, Intravenous) Active. Methocarbamol  ('100MG'$ /ML Solution, Injection) Active. Omeprazole  ('20MG'$  Capsule DR, Oral) Active. Cannabidiol  ('100MG'$ /ML Solution,  Oral) Active. Polyethyl Glyc-Propyl Glyc PF  (0.4-0.3% Solution, Ophthalmic) Active. Spironolactone  ('25MG'$ /5ML Suspension, Oral) Active. Synthroid  (125MCG Tablet, Oral) Active. Ondansetron  ('8MG'$  Tablet Disint, Oral) Active. Ondansetron HCl  ('4MG'$  Tablet, Oral) Active. Cholecal DF  (1-'3800MG'$ -UNIT Tablet, Oral) Active. Medications Reconciled   Social History Stephanie Ewing, CMA; 10/25/2020 8:55 AM) Caffeine use   Tea. No alcohol use   No drug use   Tobacco use   Current every day smoker.  Family History Stephanie Ewing, [place]; 10/25/2020 8:55 AM) Alcohol Abuse   Son. Arthritis   Brother, Mother, Sister. Heart Disease   Father, Mother, Sister, Son. Heart disease in female family member before age 29   Hypertension   Mother, Sister, Son. Ischemic Bowel Disease   Mother. Respiratory Condition   Mother. Thyroid problems   Mother, Tora Duck, Pandora Leiter.  Pregnancy / Birth History Stephanie Ewing, CMA; 10/25/2020 8:55 AM) Age at menarche   22 years. Age of menopause   44-50 Gravida   66 Maternal age   3-25 Para   2  Other Problems Stephanie Ewing, CMA; 10/25/2020 8:55 AM) Arthritis   Atrial Fibrillation   Cholelithiasis   Depression   Gastroesophageal Reflux Disease   Heart murmur   Hemorrhoids   High blood pressure   Oophorectomy   Bilateral. Other disease, cancer, significant illness   Pancreatitis   Thyroid Disease      Review of Systems Stephanie Ewing CMA; 10/25/2020 8:55 AM) General Present- Weight Gain. Not Present- Appetite Loss, Chills, Fatigue, Fever, Night Sweats and Weight Loss. Skin Not Present- Change in Wart/Mole, Dryness, Hives, Jaundice, New Lesions, Non-Healing Wounds, Rash and Ulcer. HEENT Present- Ringing in the Ears and Seasonal Allergies. Not Present- Earache, Hearing Loss, Hoarseness, Nose Bleed, Oral Ulcers, Sinus Pain, Sore Throat, Visual Disturbances, Wears glasses/contact lenses and Yellow Eyes. Respiratory Present- Chronic Cough. Not Present- Bloody sputum, Difficulty Breathing,  Snoring and Wheezing. Breast Not Present- Breast Mass, Breast Pain, Nipple Discharge and Skin Changes. Cardiovascular Not Present- Chest Pain, Difficulty Breathing Lying Down, Leg Cramps, Palpitations, Rapid Heart Rate, Shortness of Breath and Swelling of Extremities. Gastrointestinal Present- Bloating and Hemorrhoids. Not Present- Abdominal Pain, Bloody Stool, Change in Bowel Habits, Chronic diarrhea, Constipation, Difficulty Swallowing, Excessive gas, Gets full quickly at meals, Indigestion, Nausea, Rectal Pain and Vomiting. Female Genitourinary Not Present- Frequency, Nocturia, Painful Urination, Pelvic Pain and Urgency. Musculoskeletal Present- Back Pain, Joint Pain and Muscle Weakness. Not Present- Joint Stiffness, Muscle Pain and Swelling of Extremities. Neurological Present- Tingling. Not Present- Decreased Memory, Fainting, Headaches, Numbness, Seizures, Tremor, Trouble walking and Weakness. Psychiatric Present- Depression. Not Present- Anxiety, Bipolar, Change in Sleep Pattern, Fearful and Frequent crying. Endocrine Not Present- Cold Intolerance, Excessive Hunger, Hair Changes, Heat Intolerance, Hot flashes and New Diabetes. Hematology Not Present- Blood Thinners, Easy Bruising, Excessive bleeding, Gland problems, HIV and Persistent Infections.  Vitals Stephanie Ewing CMA; 10/25/2020 8:59 AM) 10/25/2020 8:59 AM Weight: 183.13 lb   Height: 67 in  Body Surface Area: 1.95 m   Body Mass Index: 28.68 kg/m   Temp.: 97.6 F    Pulse: 87 (Regular)    P.OX: 97% (Room air) BP: 130/88(Sitting, Left Arm, Standard)       Physical Exam Stephanie Gave M. Baldo Hufnagle MD; 10/25/2020 9:41 AM) The physical exam findings are as follows: Note:  Constitutional: No acute distress; conversant; no  deformities; wearing mask Eyes: Moist conjunctiva; no lid lag; anicteric sclerae; pupils equal and round Neck: Trachea midline; no palpable thyromegaly Lungs: Normal respiratory effort; no tactile fremitus CV: rrr; no  palpable thrill; no pitting edema GI: Abdomen soft, nontender, nondistended; no palpable hepatosplenomegaly MSK: Normal gait; no clubbing/cyanosis Psychiatric: Appropriate affect; alert and oriented 3 Lymphatic: No palpable cervical or axillary lymphadenopathy    Assessment & Plan Stephanie Gave M. Dezra Mandella MD; 10/25/2020 9:44 AM) BILIARY ACUTE PANCREATITIS (K85.10) Story: Ms. Mantovani is a very pleasant 16yoF with hx hyperaldosteronism, hypothyroidism with presumed biliary pancreatitis; small pancreatic lesions undergoing surveillance   -The anatomy and physiology of the hepatobiliary system was discussed at length with the patient with associated pictures. The pathophysiology of gallbladder disease and biliary pancreatitis was discussed with associated pictures as well -We discussed obtaining a short interval CT scan as recommended at the 3 month mark. This will be in early July. We discussed that assuming all is improving/resolved on this CAT scan, proceeding with cholecystectomy. -The options for treatment were discussed including ongoing observation which may result in subsequent gallbladder complications (infection, recurrent pancreatitis, choledocholithiasis, etc) and surgery - laparoscopic cholecystectomy. -The planned procedure, material risks (including, but not limited to, pain, bleeding, infection, scarring, need for blood transfusion, damage to surrounding structures- blood vessels/nerves/viscus/organs, damage to bile duct, bile leak, need for additional procedures, hernia, pancreatitis, pneumonia, heart attack, stroke, death) benefits and alternatives to surgery were discussed at length. I noted a good probability that the procedure would help improve their symptoms. The patient's questions were answered to her satisfaction, she voiced understanding and they elected to proceed with surgery. Additionally, we discussed typical postoperative expectations and the recovery process.  This patient  encounter took 48 minutes today to perform the following: take history, perform exam, review outside records, interpret imaging, counsel the patient on their diagnosis and document encounter, findings & plan in the EHR  Signed by Ileana Roup, MD (10/25/2020 9:44 AM)

## 2020-12-14 ENCOUNTER — Encounter (HOSPITAL_COMMUNITY)
Admission: RE | Admit: 2020-12-14 | Discharge: 2020-12-14 | Disposition: A | Discharge status: Home / Self Care | Payer: Medicare Other | Source: Ambulatory Visit | Attending: Surgery | Admitting: Surgery

## 2020-12-14 ENCOUNTER — Encounter (HOSPITAL_COMMUNITY): Payer: Self-pay

## 2020-12-14 ENCOUNTER — Other Ambulatory Visit: Payer: Self-pay

## 2020-12-14 DIAGNOSIS — F17200 Nicotine dependence, unspecified, uncomplicated: Secondary | ICD-10-CM | POA: Insufficient documentation

## 2020-12-14 DIAGNOSIS — Z79899 Other long term (current) drug therapy: Secondary | ICD-10-CM | POA: Diagnosis not present

## 2020-12-14 DIAGNOSIS — Z01812 Encounter for preprocedural laboratory examination: Secondary | ICD-10-CM | POA: Diagnosis present

## 2020-12-14 DIAGNOSIS — E039 Hypothyroidism, unspecified: Secondary | ICD-10-CM | POA: Insufficient documentation

## 2020-12-14 DIAGNOSIS — K219 Gastro-esophageal reflux disease without esophagitis: Secondary | ICD-10-CM | POA: Diagnosis not present

## 2020-12-14 DIAGNOSIS — Z7982 Long term (current) use of aspirin: Secondary | ICD-10-CM | POA: Diagnosis not present

## 2020-12-14 DIAGNOSIS — K859 Acute pancreatitis without necrosis or infection, unspecified: Secondary | ICD-10-CM | POA: Diagnosis not present

## 2020-12-14 DIAGNOSIS — I10 Essential (primary) hypertension: Secondary | ICD-10-CM | POA: Insufficient documentation

## 2020-12-14 DIAGNOSIS — I4891 Unspecified atrial fibrillation: Secondary | ICD-10-CM | POA: Diagnosis not present

## 2020-12-14 LAB — CBC WITH DIFFERENTIAL/PLATELET
Abs Immature Granulocytes: 0.03 10*3/uL (ref 0.00–0.07)
Basophils Absolute: 0 10*3/uL (ref 0.0–0.1)
Basophils Relative: 1 %
Eosinophils Absolute: 0.2 10*3/uL (ref 0.0–0.5)
Eosinophils Relative: 3 %
HCT: 50 % — ABNORMAL HIGH (ref 36.0–46.0)
Hemoglobin: 16.5 g/dL — ABNORMAL HIGH (ref 12.0–15.0)
Immature Granulocytes: 1 %
Lymphocytes Relative: 27 %
Lymphs Abs: 1.7 10*3/uL (ref 0.7–4.0)
MCH: 30.1 pg (ref 26.0–34.0)
MCHC: 33 g/dL (ref 30.0–36.0)
MCV: 91.2 fL (ref 80.0–100.0)
Monocytes Absolute: 0.5 10*3/uL (ref 0.1–1.0)
Monocytes Relative: 8 %
Neutro Abs: 3.9 10*3/uL (ref 1.7–7.7)
Neutrophils Relative %: 60 %
Platelets: 252 10*3/uL (ref 150–400)
RBC: 5.48 MIL/uL — ABNORMAL HIGH (ref 3.87–5.11)
RDW: 12.8 % (ref 11.5–15.5)
WBC: 6.3 10*3/uL (ref 4.0–10.5)
nRBC: 0 % (ref 0.0–0.2)

## 2020-12-14 LAB — COMPREHENSIVE METABOLIC PANEL
ALT: 30 U/L (ref 0–44)
AST: 20 U/L (ref 15–41)
Albumin: 4.7 g/dL (ref 3.5–5.0)
Alkaline Phosphatase: 55 U/L (ref 38–126)
Anion gap: 7 (ref 5–15)
BUN: 28 mg/dL — ABNORMAL HIGH (ref 8–23)
CO2: 28 mmol/L (ref 22–32)
Calcium: 10 mg/dL (ref 8.9–10.3)
Chloride: 100 mmol/L (ref 98–111)
Creatinine, Ser: 1.41 mg/dL — ABNORMAL HIGH (ref 0.44–1.00)
GFR, Estimated: 41 mL/min — ABNORMAL LOW (ref >60)
Glucose, Bld: 102 mg/dL — ABNORMAL HIGH (ref 70–99)
Potassium: 4.9 mmol/L (ref 3.5–5.1)
Sodium: 135 mmol/L (ref 135–145)
Total Bilirubin: 0.6 mg/dL (ref 0.3–1.2)
Total Protein: 7.8 g/dL (ref 6.5–8.1)

## 2020-12-14 LAB — PROTIME-INR
INR: 1.1 (ref 0.8–1.2)
Prothrombin Time: 14.5 seconds (ref 11.4–15.2)

## 2020-12-14 LAB — HEMOGLOBIN A1C
Hgb A1c MFr Bld: 6.1 % — ABNORMAL HIGH (ref 4.8–5.6)
Mean Plasma Glucose: 128.37 mg/dL

## 2020-12-14 NOTE — Progress Notes (Signed)
COVID Vaccine Completed:Yes Date COVID Vaccine completed: 2021. Bo[place] COVID vaccine manufacturer: Pfizer      PCP - Dr. Monico Blitz Cardiologist - Dr. Carlyle Dolly. LOV: 08/23/20  Chest x-ray - 05/26/20 EKG - 08/23/20: EPIC Stress Test -  ECHO - 10/29/19: EPIC Cardiac Cath -  Pacemaker/ICD device last checked:  Sleep Study -  CPAP -   Fasting Blood Sugar -  Checks Blood Sugar _____ times a day  Blood Thinner Instructions:  Aspirin Instructions: Last Dose:  Anesthesia review: Hx: Afib,pre-Dia,HTN,smoker,Heart murmur.  Patient denies shortness of breath, fever, cough and chest pain at PAT appointment   Patient verbalized understanding of instructions that were given to them at the PAT appointment. Patient was also instructed that they will need to review over the PAT instructions again at home before surgery.

## 2020-12-16 NOTE — Progress Notes (Signed)
Anesthesia Chart Review   Case: [id number] Date/Time: 12/30/20 0945   Procedure: LAPAROSCOPIC CHOLECYSTECTOMY   Anesthesia type: General   Pre-op diagnosis: BILIARY PANCREATITIS   Location: [facility] ROOM 01 / [facility]   Surgeons: Ileana Roup, MD       DISCUSSION:64 y.o. every day smoker with h/o PONV, HTN, hypothyroidism, GERD, A-fib, biliary pancreatitis scheduled for above procedure 12/30/2020 with Dr. Nadeen Landau.   Pt last seen by cardiology 08/23/20. Per OV note pt experienced 2 isolated episodes of afib in setting of severe hypokalemia, no recurrence.  VS: BP 138/72   Pulse 74   Temp 36.8 C (Oral)   Ht '5\' 7"'$  (1.702 m)   Wt 80.7 kg   LMP  (LMP Unknown)   SpO2 100%   BMI 27.88 kg/m   PROVIDERS: Monico Blitz, MD is PCP   Carlyle Dolly, MD is Cardiologist  LABS: Labs reviewed: Acceptable for surgery. (all labs ordered are listed, but only abnormal results are displayed)  Labs Reviewed  CBC WITH DIFFERENTIAL/PLATELET - Abnormal; Notable for the following components:      Result Value   RBC 5.48 (*)    Hemoglobin 16.5 (*)    HCT 50.0 (*)    All other components within normal limits  COMPREHENSIVE METABOLIC PANEL - Abnormal; Notable for the following components:   Glucose, Bld 102 (*)    BUN 28 (*)    Creatinine, Ser 1.41 (*)    GFR, Estimated 41 (*)    All other components within normal limits  HEMOGLOBIN A1C - Abnormal; Notable for the following components:   Hgb A1c MFr Bld 6.1 (*)    All other components within normal limits  PROTIME-INR     IMAGES:   EKG: 08/23/2020 Rate 89 bpm  NSR  CV: Echo 10/29/2019 1. Left ventricular ejection fraction, by estimation, is 60 to 65%. The  left ventricle has normal function. The left ventricle has no regional  wall motion abnormalities. Left ventricular diastolic parameters were  normal.   2. Right ventricular systolic function is normal. The right ventricular  size is normal. There is normal pulmonary  artery systolic pressure.   3. The mitral valve is normal in structure. Trivial mitral valve  regurgitation. No evidence of mitral stenosis.   4. The aortic valve is tricuspid. Aortic valve regurgitation is moderate.   5. The inferior vena cava is normal in size with greater than 50%  respiratory variability, suggesting right atrial pressure of 3 mmHg.  Past Medical History:  Diagnosis Date   Afib ([place])    Arthritis    psoratic   Asthma    "weak"    Constipation    Depression    Dysrhythmia    AFIb   GERD (gastroesophageal reflux disease)    History of hiatal hernia    Hyperaldosteronism ([facility])    Hypertension    Hypokalemia    Hypothyroidism    Insomnia    Migraine    ocassional   Nicotine dependence, cigarettes, uncomplicated    Pancreatitis    PONV (postoperative nausea and vomiting)    Pre-diabetes    Thyroid disease    hypothyroid    Past Surgical History:  Procedure Laterality Date   ANTERIOR CERVICAL DECOMPRESSION/DISCECTOMY FUSION 4 LEVELS N/A 07/20/2020   Procedure: ANTERIOR CERVICAL DECOMPRESSION FUSION CERVICAL FOUR-CERVICAL FIVE, CERVICAL FIVE-CERVICAL SIX, CERVICAL SIX-CERVICAL SEVEN;  Surgeon: Consuella Lose, MD;  Location: [place];  Service: Neurosurgery;  Laterality: N/A;   BIOPSY  06/28/2020  Procedure: BIOPSY;  Surgeon: Irving Copas., MD;  Location: [facility];  Service: Gastroenterology;;   ESOPHAGOGASTRODUODENOSCOPY (EGD) WITH PROPOFOL N/A 06/28/2020   Procedure: ESOPHAGOGASTRODUODENOSCOPY (EGD) WITH PROPOFOL;  Surgeon: Irving Copas., MD;  [facility];  Service: Gastroenterology;  Laterality: N/A;   ESOPHAGOGASTRODUODENOSCOPY (EGD) WITH PROPOFOL N/A 08/05/2020   Procedure: ESOPHAGOGASTRODUODENOSCOPY (EGD) WITH PROPOFOL;  Surgeon: Rush Landmark Telford Nab., MD;  Location: [place];  Service: Gastroenterology;  Laterality: N/A;   EUS N/A 06/28/2020   Procedure: UPPER ENDOSCOPIC ULTRASOUND (EUS) LINEAR;  Surgeon:  Irving Copas., MD;  [facility];  Service: Gastroenterology;  Laterality: N/A;   EUS N/A 08/05/2020   Procedure: UPPER ENDOSCOPIC ULTRASOUND (EUS) RADIAL;  Surgeon: Irving Copas., MD;  Location: [place];  Service: Gastroenterology;  Laterality: N/A;   FINE NEEDLE ASPIRATION  06/28/2020   Procedure: FINE NEEDLE ASPIRATION (FNA) LINEAR;  Surgeon: Irving Copas., MD;  [facility];  Service: Gastroenterology;;   FINE NEEDLE ASPIRATION  08/05/2020   Procedure: FINE NEEDLE ASPIRATION;  Surgeon: Irving Copas., MD;  Location: [place];  Service: Gastroenterology;;   POLYPECTOMY  06/28/2020   Procedure: POLYPECTOMY;  Surgeon: Irving Copas., MD;  [facility];  Service: Gastroenterology;;   SHOULDER SURGERY Bilateral    inpingement release   TOTAL ABDOMINAL HYSTERECTOMY  2015   ULNAR NERVE REPAIR Right     MEDICATIONS:  acetaminophen (TYLENOL) 325 MG tablet   albuterol (VENTOLIN HFA) 108 (90 Base) MCG/ACT inhaler   aspirin EC 81 MG tablet   azelastine (ASTELIN) 0.1 % nasal spray   Cannabidiol POWD   Cholecalciferol (VITAMIN D) 50 MCG (2000 UT) CAPS   diltiazem (CARDIZEM) 30 MG tablet   diphenhydrAMINE (BENADRYL) 25 MG tablet   docusate sodium (COLACE) 100 MG capsule   fexofenadine (ALLEGRA) 180 MG tablet   fluticasone-salmeterol (ADVAIR HFA) 230-21 MCG/ACT inhaler   gabapentin (NEURONTIN) 300 MG capsule   Golimumab (SIMPONI ARIA IV)   methocarbamol (ROBAXIN-750) 750 MG tablet   omeprazole (PRILOSEC) 40 MG capsule   oxyCODONE-acetaminophen (PERCOCET) 7.5-325 MG tablet   polyethylene glycol (MIRALAX) 17 g packet   promethazine (PHENERGAN) 12.5 MG tablet   RESTASIS 0.05 % ophthalmic emulsion   sodium chloride (OCEAN) 0.65 % SOLN nasal spray   spironolactone (ALDACTONE) 100 MG tablet   SYNTHROID 125 MCG tablet   traMADol (ULTRAM) 50 MG tablet   traZODone (DESYREL) 100 MG tablet   traZODone (DESYREL)  150 MG tablet   No current facility-administered medications for this encounter.     Konrad Felix, PA-C [facility] [phone]

## 2020-12-28 ENCOUNTER — Other Ambulatory Visit: Payer: Self-pay | Admitting: Gastroenterology

## 2020-12-30 ENCOUNTER — Ambulatory Visit (HOSPITAL_COMMUNITY): Payer: Medicare Other | Admitting: Physician Assistant

## 2020-12-30 ENCOUNTER — Encounter (HOSPITAL_COMMUNITY): Payer: Self-pay | Admitting: Surgery

## 2020-12-30 ENCOUNTER — Ambulatory Visit (HOSPITAL_COMMUNITY): Payer: Medicare Other | Admitting: Registered Nurse

## 2020-12-30 ENCOUNTER — Encounter (HOSPITAL_COMMUNITY)
Admission: RE | Disposition: A | Discharge status: Home / Self Care | Payer: Self-pay | Source: Home / Self Care | Attending: Surgery

## 2020-12-30 ENCOUNTER — Ambulatory Visit (HOSPITAL_COMMUNITY)
Admission: RE | Admit: 2020-12-30 | Discharge: 2020-12-30 | Disposition: A | Discharge status: Home / Self Care | Payer: Medicare Other | Attending: Surgery | Admitting: Surgery

## 2020-12-30 DIAGNOSIS — K8010 Calculus of gallbladder with chronic cholecystitis without obstruction: Secondary | ICD-10-CM | POA: Diagnosis not present

## 2020-12-30 DIAGNOSIS — Z79899 Other long term (current) drug therapy: Secondary | ICD-10-CM | POA: Diagnosis not present

## 2020-12-30 DIAGNOSIS — K760 Fatty (change of) liver, not elsewhere classified: Secondary | ICD-10-CM | POA: Insufficient documentation

## 2020-12-30 DIAGNOSIS — Z8719 Personal history of other diseases of the digestive system: Secondary | ICD-10-CM | POA: Insufficient documentation

## 2020-12-30 DIAGNOSIS — F17210 Nicotine dependence, cigarettes, uncomplicated: Secondary | ICD-10-CM | POA: Diagnosis not present

## 2020-12-30 DIAGNOSIS — K8020 Calculus of gallbladder without cholecystitis without obstruction: Secondary | ICD-10-CM | POA: Diagnosis present

## 2020-12-30 HISTORY — PX: CHOLECYSTECTOMY: SHX55

## 2020-12-30 LAB — CBC
HCT: 48.3 % — ABNORMAL HIGH (ref 36.0–46.0)
Hemoglobin: 15.9 g/dL — ABNORMAL HIGH (ref 12.0–15.0)
MCH: 30.2 pg (ref 26.0–34.0)
MCHC: 32.9 g/dL (ref 30.0–36.0)
MCV: 91.7 fL (ref 80.0–100.0)
Platelets: 222 10*3/uL (ref 150–400)
RBC: 5.27 MIL/uL — ABNORMAL HIGH (ref 3.87–5.11)
RDW: 13 % (ref 11.5–15.5)
WBC: 5.5 10*3/uL (ref 4.0–10.5)
nRBC: 0 % (ref 0.0–0.2)

## 2020-12-30 LAB — BASIC METABOLIC PANEL
Anion gap: 8 (ref 5–15)
BUN: 24 mg/dL — ABNORMAL HIGH (ref 8–23)
CO2: 27 mmol/L (ref 22–32)
Calcium: 9.9 mg/dL (ref 8.9–10.3)
Chloride: 105 mmol/L (ref 98–111)
Creatinine, Ser: 1.27 mg/dL — ABNORMAL HIGH (ref 0.44–1.00)
GFR, Estimated: 47 mL/min — ABNORMAL LOW (ref >60)
Glucose, Bld: 117 mg/dL — ABNORMAL HIGH (ref 70–99)
Potassium: 4.1 mmol/L (ref 3.5–5.1)
Sodium: 140 mmol/L (ref 135–145)

## 2020-12-30 SURGERY — LAPAROSCOPIC CHOLECYSTECTOMY
Anesthesia: General | Site: Abdomen

## 2020-12-30 MED ORDER — PROPOFOL 10 MG/ML IV BOLUS
INTRAVENOUS | Status: AC
  Filled 2020-12-30: qty 20

## 2020-12-30 MED ORDER — LIDOCAINE HCL (CARDIAC) PF 100 MG/5ML IV SOSY
PREFILLED_SYRINGE | INTRAVENOUS | Status: DC | PRN
  Administered 2020-12-30: 80 mg via INTRAVENOUS

## 2020-12-30 MED ORDER — OXYCODONE HCL 5 MG/5ML PO SOLN: 5.0000 mg | Freq: Once | ORAL | Status: AC | PRN

## 2020-12-30 MED ORDER — EPHEDRINE 5 MG/ML INJ
INTRAVENOUS | Status: AC
  Filled 2020-12-30: qty 5

## 2020-12-30 MED ORDER — PHENYLEPHRINE 40 MCG/ML (10ML) SYRINGE FOR IV PUSH (FOR BLOOD PRESSURE SUPPORT)
PREFILLED_SYRINGE | INTRAVENOUS | Status: AC
  Filled 2020-12-30: qty 10

## 2020-12-30 MED ORDER — ONDANSETRON HCL 4 MG/2ML IJ SOLN
INTRAMUSCULAR | Status: DC | PRN
  Administered 2020-12-30: 4 mg via INTRAVENOUS

## 2020-12-30 MED ORDER — TRAMADOL HCL 50 MG PO TABS: 50.0000 mg | ORAL_TABLET | Freq: Four times a day (QID) | ORAL | 0 refills | Status: AC | PRN

## 2020-12-30 MED ORDER — SUGAMMADEX SODIUM 500 MG/5ML IV SOLN
INTRAVENOUS | Status: AC
  Filled 2020-12-30: qty 5

## 2020-12-30 MED ORDER — APREPITANT 40 MG PO CAPS
40.0000 mg | ORAL_CAPSULE | Freq: Once | ORAL | Status: AC
  Administered 2020-12-30: 40 mg via ORAL
  Filled 2020-12-30: qty 1

## 2020-12-30 MED ORDER — MIDAZOLAM HCL 2 MG/2ML IJ SOLN
INTRAMUSCULAR | Status: DC | PRN
  Administered 2020-12-30: 2 mg via INTRAVENOUS

## 2020-12-30 MED ORDER — PHENYLEPHRINE 40 MCG/ML (10ML) SYRINGE FOR IV PUSH (FOR BLOOD PRESSURE SUPPORT)
PREFILLED_SYRINGE | INTRAVENOUS | Status: DC | PRN
  Administered 2020-12-30: 80 ug via INTRAVENOUS

## 2020-12-30 MED ORDER — BUPIVACAINE-EPINEPHRINE 0.25% -1:200000 IJ SOLN
INTRAMUSCULAR | Status: DC | PRN
  Administered 2020-12-30: 30 mL

## 2020-12-30 MED ORDER — LIDOCAINE 2% (20 MG/ML) 5 ML SYRINGE
INTRAMUSCULAR | Status: AC
  Filled 2020-12-30: qty 5

## 2020-12-30 MED ORDER — DEXAMETHASONE SODIUM PHOSPHATE 10 MG/ML IJ SOLN
INTRAMUSCULAR | Status: AC
  Filled 2020-12-30: qty 1

## 2020-12-30 MED ORDER — EPHEDRINE SULFATE 50 MG/ML IJ SOLN
INTRAMUSCULAR | Status: DC | PRN
  Administered 2020-12-30 (×2): 5 mg via INTRAVENOUS

## 2020-12-30 MED ORDER — LACTATED RINGERS IR SOLN
Status: DC | PRN
  Administered 2020-12-30: 1000 mL

## 2020-12-30 MED ORDER — OXYCODONE HCL 5 MG PO TABS
ORAL_TABLET | ORAL | Status: AC
  Administered 2020-12-30: 5 mg via ORAL
  Filled 2020-12-30: qty 1

## 2020-12-30 MED ORDER — HYDROMORPHONE HCL 1 MG/ML IJ SOLN: 0.2500 mg | INTRAMUSCULAR | Status: DC | PRN

## 2020-12-30 MED ORDER — ORAL CARE MOUTH RINSE: 15.0000 mL | Freq: Once | OROMUCOSAL | Status: AC

## 2020-12-30 MED ORDER — DEXAMETHASONE SODIUM PHOSPHATE 10 MG/ML IJ SOLN: INTRAMUSCULAR | Status: DC | PRN

## 2020-12-30 MED ORDER — FENTANYL CITRATE (PF) 100 MCG/2ML IJ SOLN
INTRAMUSCULAR | Status: AC
  Filled 2020-12-30: qty 2

## 2020-12-30 MED ORDER — ONDANSETRON HCL 4 MG/2ML IJ SOLN
INTRAMUSCULAR | Status: AC
  Filled 2020-12-30: qty 2

## 2020-12-30 MED ORDER — BUPIVACAINE-EPINEPHRINE (PF) 0.25% -1:200000 IJ SOLN
INTRAMUSCULAR | Status: AC
  Filled 2020-12-30: qty 30

## 2020-12-30 MED ORDER — FENTANYL CITRATE (PF) 100 MCG/2ML IJ SOLN
INTRAMUSCULAR | Status: DC | PRN
  Administered 2020-12-30: 100 ug via INTRAVENOUS

## 2020-12-30 MED ORDER — SUGAMMADEX SODIUM 200 MG/2ML IV SOLN
INTRAVENOUS | Status: DC | PRN
  Administered 2020-12-30: 200 mg via INTRAVENOUS

## 2020-12-30 MED ORDER — CHLORHEXIDINE GLUCONATE CLOTH 2 % EX PADS: 6.0000 | MEDICATED_PAD | Freq: Once | CUTANEOUS | Status: DC

## 2020-12-30 MED ORDER — PROPOFOL 1000 MG/100ML IV EMUL
INTRAVENOUS | Status: AC
  Filled 2020-12-30: qty 100

## 2020-12-30 MED ORDER — MIDAZOLAM HCL 2 MG/2ML IJ SOLN
INTRAMUSCULAR | Status: AC
  Filled 2020-12-30: qty 2

## 2020-12-30 MED ORDER — LACTATED RINGERS IV SOLN: INTRAVENOUS | Status: DC

## 2020-12-30 MED ORDER — PROPOFOL 10 MG/ML IV BOLUS
INTRAVENOUS | Status: DC | PRN
  Administered 2020-12-30: 140 mg via INTRAVENOUS

## 2020-12-30 MED ORDER — OXYCODONE HCL 5 MG PO TABS: 5.0000 mg | ORAL_TABLET | Freq: Once | ORAL | Status: AC | PRN

## 2020-12-30 MED ORDER — ROCURONIUM BROMIDE 100 MG/10ML IV SOLN
INTRAVENOUS | Status: DC | PRN
  Administered 2020-12-30: 50 mg via INTRAVENOUS

## 2020-12-30 MED ORDER — CHLORHEXIDINE GLUCONATE 0.12 % MT SOLN
15.0000 mL | Freq: Once | OROMUCOSAL | Status: AC
  Administered 2020-12-30: 15 mL via OROMUCOSAL

## 2020-12-30 MED ORDER — CEFAZOLIN SODIUM-DEXTROSE 2-4 GM/100ML-% IV SOLN
2.0000 g | INTRAVENOUS | Status: AC
  Administered 2020-12-30: 2 g via INTRAVENOUS
  Filled 2020-12-30: qty 100

## 2020-12-30 MED ORDER — ACETAMINOPHEN 500 MG PO TABS
1000.0000 mg | ORAL_TABLET | ORAL | Status: AC
  Administered 2020-12-30: 1000 mg via ORAL
  Filled 2020-12-30: qty 2

## 2020-12-30 MED ORDER — DEXAMETHASONE SODIUM PHOSPHATE 10 MG/ML IJ SOLN
INTRAMUSCULAR | Status: DC | PRN
  Administered 2020-12-30: 8 mg via INTRAVENOUS

## 2020-12-30 MED ORDER — ONDANSETRON HCL 4 MG/2ML IJ SOLN: 4.0000 mg | Freq: Once | INTRAMUSCULAR | Status: DC | PRN

## 2020-12-30 MED ORDER — SCOPOLAMINE 1 MG/3DAYS TD PT72
1.0000 | MEDICATED_PATCH | TRANSDERMAL | Status: DC
  Administered 2020-12-30: 1.5 mg via TRANSDERMAL
  Filled 2020-12-30: qty 1

## 2020-12-30 SURGICAL SUPPLY — 40 items
APPLICATOR ARISTA FLEXITIP XL (MISCELLANEOUS) IMPLANT
APPLIER CLIP 5 13 M/L LIGAMAX5 (MISCELLANEOUS) ×2
APPLIER CLIP ROT 10 11.4 M/L (STAPLE)
BAG COUNTER SPONGE SURGICOUNT (BAG) IMPLANT
CABLE HIGH FREQUENCY MONO STRZ (ELECTRODE) ×2 IMPLANT
CHLORAPREP W/TINT 26 (MISCELLANEOUS) ×2 IMPLANT
CLIP APPLIE 5 13 M/L LIGAMAX5 (MISCELLANEOUS) ×1 IMPLANT
CLIP APPLIE ROT 10 11.4 M/L (STAPLE) IMPLANT
COVER MAYO STAND STRL (DRAPES) IMPLANT
COVER SURGICAL LIGHT HANDLE (MISCELLANEOUS) ×2 IMPLANT
DECANTER SPIKE VIAL GLASS SM (MISCELLANEOUS) ×2 IMPLANT
DERMABOND ADVANCED (GAUZE/BANDAGES/DRESSINGS) ×1
DERMABOND ADVANCED .7 DNX12 (GAUZE/BANDAGES/DRESSINGS) ×1 IMPLANT
DISSECTOR BLUNT TIP ENDO 5MM (MISCELLANEOUS) IMPLANT
DRAPE C-ARM 42X120 X-RAY (DRAPES) IMPLANT
ELECT PENCIL ROCKER SW 15FT (MISCELLANEOUS) ×2 IMPLANT
ELECT REM PT RETURN 15FT ADLT (MISCELLANEOUS) ×2 IMPLANT
GLOVE SURG ENC MOIS LTX SZ7.5 (GLOVE) ×2 IMPLANT
GLOVE SURG UNDER LTX SZ8 (GLOVE) ×2 IMPLANT
GOWN STRL REUS W/TWL XL LVL3 (GOWN DISPOSABLE) ×4 IMPLANT
GRASPER SUT TROCAR 14GX15 (MISCELLANEOUS) IMPLANT
HEMOSTAT ARISTA ABSORB 3G PWDR (HEMOSTASIS) IMPLANT
HEMOSTAT SNOW SURGICEL 2X4 (HEMOSTASIS) IMPLANT
KIT BASIN OR (CUSTOM PROCEDURE TRAY) ×2 IMPLANT
NEEDLE INSUFFLATION 14GA 120MM (NEEDLE) IMPLANT
POUCH SPECIMEN RETRIEVAL 10MM (ENDOMECHANICALS) ×2 IMPLANT
SCISSORS LAP 5X35 DISP (ENDOMECHANICALS) ×2 IMPLANT
SET CHOLANGIOGRAPH MIX (MISCELLANEOUS) IMPLANT
SET IRRIG TUBING LAPAROSCOPIC (IRRIGATION / IRRIGATOR) ×2 IMPLANT
SET TUBE SMOKE EVAC HIGH FLOW (TUBING) ×2 IMPLANT
SLEEVE ADV FIXATION 5X100MM (TROCAR) ×4 IMPLANT
SUT MNCRL AB 4-0 PS2 18 (SUTURE) ×2 IMPLANT
SYR 10ML ECCENTRIC (SYRINGE) ×2 IMPLANT
TOWEL OR 17X26 10 PK STRL BLUE (TOWEL DISPOSABLE) ×2 IMPLANT
TOWEL OR NON WOVEN STRL DISP B (DISPOSABLE) IMPLANT
TRAY LAPAROSCOPIC (CUSTOM PROCEDURE TRAY) ×2 IMPLANT
TROCAR ADV FIXATION 12X100MM (TROCAR) IMPLANT
TROCAR ADV FIXATION 5X100MM (TROCAR) ×2 IMPLANT
TROCAR XCEL BLUNT TIP 100MML (ENDOMECHANICALS) ×2 IMPLANT
TROCAR XCEL NON-BLD 11X100MML (ENDOMECHANICALS) IMPLANT

## 2020-12-30 NOTE — Anesthesia Procedure Notes (Signed)
Procedure Name: Intubation Date/Time: 12/30/2020 9:36 AM Performed by: Hedda Slade, CRNA Pre-anesthesia Checklist: Patient identified, Patient being monitored, Timeout performed, Emergency Drugs available and Suction available Patient Re-evaluated:Patient Re-evaluated prior to induction Oxygen Delivery Method: Circle system utilized Preoxygenation: Pre-oxygenation with 100% oxygen Induction Type: IV induction Ventilation: Mask ventilation without difficulty and Oral airway inserted - appropriate to patient size Laryngoscope Size: Mac and 3 Grade View: Grade II Tube type: Oral Tube size: 7.0 mm Number of attempts: 1 Airway Equipment and Method: Stylet Placement Confirmation: ETT inserted through vocal cords under direct vision, positive ETCO2 and breath sounds checked- equal and bilateral Secured at: 21 cm Tube secured with: Tape Dental Injury: Teeth and Oropharynx as per pre-operative assessment

## 2020-12-30 NOTE — Discharge Instructions (Addendum)
POST OP INSTRUCTIONS  DIET: As tolerated. Follow a light bland diet the first 24 hours after arrival home, such as soup, liquids, crackers, etc.  Be sure to include lots of fluids daily.  Avoid fast food or heavy meals as your are more likely to get nauseated.  Eat a low fat the next few days after surgery.  Take your usually prescribed home medications unless otherwise directed.  PAIN CONTROL: Pain is best controlled by a usual combination of three different methods TOGETHER: Ice/Heat Over the counter pain medication Prescription pain medication Most patients will experience some swelling and bruising around the surgical site.  Ice packs or heating pads (30-60 minutes up to 6 times a day) will help. Some people prefer to use ice alone, heat alone, alternating between ice & heat.  Experiment to what works for you.  Swelling and bruising can take several weeks to resolve.   It is helpful to take an over-the-counter pain medication regularly for the first few weeks: Ibuprofen (Motrin/Advil) - '200mg'$  tabs - take 3 tabs ('600mg'$ ) every 6 hours as needed for pain Acetaminophen (Tylenol) - you may take '650mg'$  every 6 hours as needed. You can take this with motrin as they act differently on the body. If you are taking a narcotic pain medication that has acetaminophen in it, do not take over the counter tylenol at the same time.  Iii. NOTE: You may take both of these medications together - most patients  find it most helpful when alternating between the two (i.e. Ibuprofen at 6am,  tylenol at 9am, ibuprofen at 12pm ..Marland Kitchen) A  prescription for pain medication should be given to you upon discharge.  Take your pain medication as prescribed if your pain is not adequatly controlled with the over-the-counter pain reliefs mentioned above.  Avoid getting constipated.  Between the surgery and the pain medications, it is common to experience some constipation.  Increasing fluid intake and taking a fiber supplement (such as  Metamucil, Citrucel, FiberCon, MiraLax, etc) 1-2 times a day regularly will usually help prevent this problem from occurring.  A mild laxative (prune juice, Milk of Magnesia, MiraLax, etc) should be taken according to package directions if there are no bowel movements after 48 hours.    Dressing: Your incision is covered in Dermabond which is like sterile superglue for the skin. This will come off on it's own in a couple weeks. It is waterproof and you may bathe normally starting the day after your surgery in a shower. Avoid baths/pools/lakes/oceans until your wounds have fully healed.  ACTIVITIES as tolerated:   Avoid heavy lifting (>10lbs or 1 gallon of milk) for the next 6 weeks. You may resume regular (light) daily activities beginning the next day--such as daily self-care, walking, climbing stairs--gradually increasing activities as tolerated.  If you can walk 30 minutes without difficulty, it is safe to try more intense activity such as jogging, treadmill, bicycling, low-impact aerobics.  DO NOT PUSH THROUGH PAIN.  Let pain be your guide: If it hurts to do something, don't do it. You may drive when you are no longer taking prescription pain medication, you can comfortably wear a seatbelt, and you can safely maneuver your car and apply brakes.   FOLLOW UP in our office Please call [facility] at [phone] to set up an appointment to see your surgeon in the office for a follow-up appointment approximately 2 weeks after your surgery. Make sure that you call for this appointment the day you arrive home to  insure a convenient appointment time.  9. If you have disability or family leave forms that need to be completed, you may have them completed by your primary care physician's office; for return to work instructions, please ask our office staff and they will be happy to assist you in obtaining this documentation   When to call us [phone]: Poor pain control Reactions / problems with new  medications (rash/itching, etc)  Fever over 101.5 F (38.5 C) Inability to urinate Nausea/vomiting Worsening swelling or bruising Continued bleeding from incision. Increased pain, redness, or drainage from the incision  The clinic staff is available to answer your questions during regular business hours (8:30am-5pm).  Please don't hesitate to call and ask to speak to one of our nurses for clinical concerns.   A surgeon from [facility] is always on call at the hospitals   If you have a medical emergency, go to the nearest emergency room or call 911.  [facility], [place] [address] [place], [place], [place]  [address] MAIN: [id number] FAX: [phone] [email or web address]

## 2020-12-30 NOTE — H&P (Signed)
CC: Here today for surgery - laparoscopic cholecystectomy  HPI: Stephanie Ewing is an 65 y.o. female who is hx of hyperaldosteronism, hypothyroidism who has been diagnosed with recurrent pancreatitis-twice in the last 7 months.  With these attacks she reports severe burning midepigastric pain and last on the order of days and will bring her to the hospital.  She has been seen and evaluated by gastroenterology.   RUQ [place] 04/13/20 - unremarkable in appearance.  CBD 7 mm.  No gallstones or wall thickening was seen. CT abdomen/pelvis with contrast 05/06/20-mild hazy peripancreatic inflammation of the pancreatic head consistent with focal pancreatitis. CTA chest PE protocol 05/09/20-no pulmonary artery embolus.  Findings concerning for acute pancreatitis. RUQ [place] 05/24/20 - apparent polyps along the gallbladder wall largest measuring 4 mm.  No wall thickening or pericholecystic fluid.  Increased liver echogenicity consistent with hepatic steatosis.  No liver lesions. CTA Chest/Abd/pelvis 05/26/20 -  no aortic dissection.  No PE.  Improved peripancreatic fat stranding.  Moderate stool burden. MRCP 06/23/20-findings are highly concerning for a possible primary pancreatic neoplasm in the inferior aspect of the pancreatic head measuring 3.4 x 2.2 x 2.4 cm.  This exerts mild mass effect upon the adjacent distal pancreatic duct and distal common bile duct.  Possibility of focal pancreatitis is not excluded however.  Multiple tiny subcentimeter cystic appearing lesions scattered elsewhere throughout the pancreas.  Repeat MRI/MRCP recommended at 1 year. CT abdomen/pelvis with and without contrast 08/17/20-mildly decreased soft tissue prominence and pancreatic head most consistent with resolving focal pancreatitis.  No focal pancreatic mass visualized.  Recommend follow-up CT in 3 months.   She has been following with GI and is also seeing physicians at [facility].  She had an EUS completed with Dr. Francella Solian 09/02/20 per chart  review.  She diffuse cystic lesions in the pancreatic body and neck.  Main pancreatic duct was normal as well as the CBD.  She had stones and sludge noted in her gallbladder.  They did not see any mass lesion of the pancreas at that time.  She is following with gastroenterology who referred her to see [place] for consideration of cholecystectomy given the findings of sludge and stones.   She has continued to do well with the exception of 2 attacks of now RUQ pain, different than when she had pancreatitis - this would last a couple days and then resolve. No further MEG pains. No n/v.  PMH: Hyperaldosteronism, hypothyroidism   PSH: BTL; robotic hysterectomy in [place] for menorrhagia   FHx: Denies FHx of colorectal, breast, endometrial, ovarian or cervical cancer   Social: Denies use of EtOH/drugs. Smokes 1 pack per day. She is a retried respiratory therapist. Her husband is her ride today   ROS: A comprehensive 10 system review of systems was completed with the patient and pertinent findings as noted above  Past Medical History:  Diagnosis Date   Afib ([place])    Arthritis    psoratic   Asthma    "weak"    Constipation    Depression    Dysrhythmia    AFIb   GERD (gastroesophageal reflux disease)    History of hiatal hernia    Hyperaldosteronism (HCC)    Hypertension    Hypokalemia    Hypothyroidism    Insomnia    Migraine    ocassional   Nicotine dependence, cigarettes, uncomplicated    Pancreatitis    PONV (postoperative nausea and vomiting)    Pre-diabetes    Thyroid disease  hypothyroid    Past Surgical History:  Procedure Laterality Date   ANTERIOR CERVICAL DECOMPRESSION/DISCECTOMY FUSION 4 LEVELS N/A 07/20/2020   Procedure: ANTERIOR CERVICAL DECOMPRESSION FUSION CERVICAL FOUR-CERVICAL FIVE, CERVICAL FIVE-CERVICAL SIX, CERVICAL SIX-CERVICAL SEVEN;  Surgeon: Consuella Lose, MD;  Location: [place];  Service: Neurosurgery;  Laterality: N/A;   BIOPSY  06/28/2020    Procedure: BIOPSY;  Surgeon: Rush Landmark Telford Nab., MD;  Location: [facility];  Service: Gastroenterology;;   ESOPHAGOGASTRODUODENOSCOPY (EGD) WITH PROPOFOL N/A 06/28/2020   Procedure: ESOPHAGOGASTRODUODENOSCOPY (EGD) WITH PROPOFOL;  Surgeon: Irving Copas., MD;  Location: [facility];  Service: Gastroenterology;  Laterality: N/A;   ESOPHAGOGASTRODUODENOSCOPY (EGD) WITH PROPOFOL N/A 08/05/2020   Procedure: ESOPHAGOGASTRODUODENOSCOPY (EGD) WITH PROPOFOL;  Surgeon: Rush Landmark Telford Nab., MD;  Location: [place];  Service: Gastroenterology;  Laterality: N/A;   EUS N/A 06/28/2020   Procedure: UPPER ENDOSCOPIC ULTRASOUND (EUS) LINEAR;  Surgeon: Irving Copas., MD;  Location: [facility];  Service: Gastroenterology;  Laterality: N/A;   EUS N/A 08/05/2020   Procedure: UPPER ENDOSCOPIC ULTRASOUND (EUS) RADIAL;  Surgeon: Irving Copas., MD;  Location: [place];  Service: Gastroenterology;  Laterality: N/A;   FINE NEEDLE ASPIRATION  06/28/2020   Procedure: FINE NEEDLE ASPIRATION (FNA) LINEAR;  Surgeon: Irving Copas., MD;  Location: [facility];  Service: Gastroenterology;;   FINE NEEDLE ASPIRATION  08/05/2020   Procedure: FINE NEEDLE ASPIRATION;  Surgeon: Irving Copas., MD;  Location: [place];  Service: Gastroenterology;;   POLYPECTOMY  06/28/2020   Procedure: POLYPECTOMY;  Surgeon: Irving Copas., MD;  Location: [facility];  Service: Gastroenterology;;   SHOULDER SURGERY Bilateral    inpingement release   TOTAL ABDOMINAL HYSTERECTOMY  2015   ULNAR NERVE REPAIR Right     Family History  Problem Relation Age of Onset   Hypertension Mother    Thyroid disease Mother        hypothyroidism   Thyroid cancer Cousin    Asthma Father    Heart disease Father        from scarlet fever; died age 65 MVA   Hypertension Sister    Hypothyroidism Sister    Hypertension Maternal Grandmother    Heart failure Maternal Grandmother     Diabetes Paternal Grandmother    Heart failure Paternal Grandmother     Social:  reports that she has been smoking cigarettes. She started smoking about 49 years ago. She has a 18.75 pack-year smoking history. She has never used smokeless tobacco. She reports that she does not currently use alcohol. She reports that she does not use drugs.  Allergies: No Known Allergies  Medications: I have reviewed the patient's current medications.  Results for orders placed or performed during the hospital encounter of 12/30/20 (from the past 48 hour(s))  CBC per protocol     Status: Abnormal   Collection Time: 12/30/20  7:17 AM  Result Value Ref Range   WBC 5.5 4.0 - 10.5 K/uL   RBC 5.27 (H) 3.87 - 5.11 MIL/uL   Hemoglobin 15.9 (H) 12.0 - 15.0 g/dL   HCT 48.3 (H) 36.0 - 46.0 %   MCV 91.7 80.0 - 100.0 fL   MCH 30.2 26.0 - 34.0 pg   MCHC 32.9 30.0 - 36.0 g/dL   RDW 13.0 11.5 - 15.5 %   Platelets 222 150 - 400 K/uL   nRBC 0.0 0.0 - 0.2 %    Comment: Performed at [facility], [place] [address], [place], [place] [id number]  Basic metabolic panel per  protocol     Status: Abnormal   Collection Time: 12/30/20  7:17 AM  Result Value Ref Range   Sodium 140 135 - 145 mmol/L   Potassium 4.1 3.5 - 5.1 mmol/L   Chloride 105 98 - 111 mmol/L   CO2 27 22 - 32 mmol/L   Glucose, Bld 117 (H) 70 - 99 mg/dL    Comment: Glucose reference range applies only to samples taken after fasting for at least 8 hours.   BUN 24 (H) 8 - 23 mg/dL   Creatinine, Ser 1.27 (H) 0.44 - 1.00 mg/dL   Calcium 9.9 8.9 - 10.3 mg/dL   GFR, Estimated 47 (L) >60 mL/min    Comment: (NOTE) Calculated using the CKD-EPI Creatinine Equation (2021)    Anion gap 8 5 - 15    Comment: Performed at [facility], [place] [address], [place], [place] [address]    No results found.  ROS - all of the below systems have been reviewed with the patient and positives are indicated with bold text General: chills, fever  or night sweats Eyes: blurry vision or double vision ENT: epistaxis or sore throat Allergy/Immunology: itchy/watery eyes or nasal congestion Hematologic/Lymphatic: bleeding problems, blood clots or swollen lymph nodes Endocrine: temperature intolerance or unexpected weight changes Breast: new or changing breast lumps or nipple discharge Resp: cough, shortness of breath, or wheezing CV: chest pain or dyspnea on exertion GI: as per HPI GU: dysuria, trouble voiding, or hematuria MSK: joint pain or joint stiffness Neuro: TIA or stroke symptoms Derm: pruritus and skin lesion changes Psych: anxiety and depression  PE Blood pressure (!) 142/75, pulse 71, temperature 98.4 F (36.9 C), temperature source Oral, resp. rate 16, weight 80.7 kg, SpO2 100 %. Constitutional: NAD; conversant Eyes: Moist conjunctiva; no lid lag; anicteric Lungs: Normal respiratory effort CV: RRR; no pitting edema GI: Abd soft, NT/ND; no palpable hepatosplenomegaly MSK: Normal range of motion of extremities Psychiatric: Appropriate affect; alert and oriented x3  Results for orders placed or performed during the hospital encounter of 12/30/20 (from the past 48 hour(s))  CBC per protocol     Status: Abnormal   Collection Time: 12/30/20  7:17 AM  Result Value Ref Range   WBC 5.5 4.0 - 10.5 K/uL   RBC 5.27 (H) 3.87 - 5.11 MIL/uL   Hemoglobin 15.9 (H) 12.0 - 15.0 g/dL   HCT 48.3 (H) 36.0 - 46.0 %   MCV 91.7 80.0 - 100.0 fL   MCH 30.2 26.0 - 34.0 pg   MCHC 32.9 30.0 - 36.0 g/dL   RDW 13.0 11.5 - 15.5 %   Platelets 222 150 - 400 K/uL   nRBC 0.0 0.0 - 0.2 %    Comment: Performed at [facility], [place] [address] [place], [place] [id number]  Basic metabolic panel per protocol     Status: Abnormal   Collection Time: 12/30/20  7:17 AM  Result Value Ref Range   Sodium 140 135 - 145 mmol/L   Potassium 4.1 3.5 - 5.1 mmol/L   Chloride 105 98 - 111 mmol/L   CO2 27 22 - 32 mmol/L   Glucose, Bld 117 (H)  70 - 99 mg/dL    Comment: Glucose reference range applies only to samples taken after fasting for at least 8 hours.   BUN 24 (H) 8 - 23 mg/dL   Creatinine, Ser 1.27 (H) 0.44 - 1.00 mg/dL   Calcium 9.9 8.9 - 10.3 mg/dL   GFR, Estimated 47 (  L) >60 mL/min    Comment: (NOTE) Calculated using the CKD-EPI Creatinine Equation (2021)    Anion gap 8 5 - 15    Comment: Performed at [facility], [place] [address] [place], [place] [address]    No results found.   A/P: Ms. Henn is a very pleasant 61yoF with hx hyperaldosteronism, hypothyroidism with presumed biliary pancreatitis; small pancreatic lesions undergoing surveillance  -CT done at [facility] 11/12/20 - "Negative. No radiographic evidence of pancreatitis or other significant abnormality."    -The anatomy and physiology of the hepatobiliary system was discussed at length with the patient with associated pictures. The pathophysiology of gallbladder disease and biliary pancreatitis was discussed with associated pictures as well -The options for treatment were discussed including ongoing observation which may result in subsequent gallbladder complications (infection, recurrent pancreatitis, choledocholithiasis, etc) and surgery - laparoscopic cholecystectomy. -The planned procedure, material risks (including, but not limited to, pain, bleeding, infection, scarring, need for blood transfusion, damage to surrounding structures- blood vessels/nerves/viscus/organs, damage to bile duct, bile leak, need for additional procedures, hernia, pancreatitis, pneumonia, heart attack, stroke, death) benefits and alternatives to surgery were discussed at length. I noted a good probability that the procedure would help improve their symptoms. The patient's questions were answered to her satisfaction, she voiced understanding and they elected to proceed with surgery. Additionally, we discussed typical postoperative expectations and the recovery  process.  Nadeen Landau, MD [facility] Surgery Use [email or web address] to contact on call provider

## 2020-12-30 NOTE — Anesthesia Postprocedure Evaluation (Signed)
Anesthesia Post Note  Patient: Stephanie Ewing  Procedure(s) Performed: LAPAROSCOPIC CHOLECYSTECTOMY (Abdomen)     Patient location during evaluation: PACU Anesthesia Type: General Level of consciousness: awake and alert and oriented Pain management: pain level controlled Vital Signs Assessment: post-procedure vital signs reviewed and stable Respiratory status: spontaneous breathing, nonlabored ventilation and respiratory function stable Cardiovascular status: blood pressure returned to baseline and stable Postop Assessment: no apparent nausea or vomiting Anesthetic complications: no   No notable events documented.  Last Vitals:  Vitals:   12/30/20 1200 12/30/20 1208  BP:  140/73  Pulse: 70 69  Resp: 13 16  Temp:  (!) 36.3 C  SpO2: 99% 95%    Last Pain:  Vitals:   12/30/20 1208  TempSrc:   PainSc: 2                  Jevin Camino A.

## 2020-12-30 NOTE — Transfer of Care (Signed)
Immediate Anesthesia Transfer of Care Note  Patient: Stephanie Ewing  Procedure(s) Performed: LAPAROSCOPIC CHOLECYSTECTOMY (Abdomen)  Patient Location: PACU  Anesthesia Type:General  Level of Consciousness: sedated  Airway & Oxygen Therapy: Patient Spontanous Breathing and Patient connected to face mask oxygen  Post-op Assessment: Report given to RN and Post -op Vital signs reviewed and stable  Post vital signs: Reviewed and stable  Last Vitals:  Vitals Value Taken Time  BP    Temp    Pulse    Resp    SpO2      Last Pain:  Vitals:   12/30/20 [id number]  TempSrc: Oral  PainSc:          Complications: No notable events documented.

## 2020-12-30 NOTE — Anesthesia Preprocedure Evaluation (Signed)
Anesthesia Evaluation  Patient identified by MRN, date of birth, ID band Patient awake    Reviewed: Allergy & Precautions, NPO status , Patient's Chart, lab work & pertinent test results, reviewed documented beta blocker date and time   History of Anesthesia Complications (+) PONV and history of anesthetic complications  Airway Mallampati: II  TM Distance: >3 FB Neck ROM: Full    Dental no notable dental hx. (+) Teeth Intact, Dental Advisory Given   Pulmonary asthma , Current Smoker and Patient abstained from smoking.,    Pulmonary exam normal breath sounds clear to auscultation       Cardiovascular hypertension, Pt. on medications Normal cardiovascular exam+ dysrhythmias Atrial Fibrillation  Rhythm:Regular Rate:Normal  Hx/o paroxysmal atrial fibrillation- off Eliquis for 2 1/2 years   Neuro/Psych  Headaches, PSYCHIATRIC DISORDERS Depression  Neuromuscular disease    GI/Hepatic hiatal hernia, GERD  Medicated,Recurrent Biliary pancreatitis   Endo/Other  Hypothyroidism Hyperaldosteronism  Renal/GU Renal InsufficiencyRenal disease  negative genitourinary   Musculoskeletal  (+) Arthritis , Osteoarthritis,    Abdominal   Peds  Hematology negative hematology ROS (+)   Anesthesia Other Findings   Reproductive/Obstetrics                             Anesthesia Physical Anesthesia Plan  ASA: 3  Anesthesia Plan: General   Post-op Pain Management:    Induction: Intravenous  PONV Risk Score and Plan: 4 or greater and Treatment may vary due to age or medical condition, Scopolamine patch - Pre-op, Midazolam, Ondansetron and Dexamethasone  Airway Management Planned: Oral ETT  Additional Equipment:   Intra-op Plan:   Post-operative Plan: Extubation in OR  Informed Consent: I have reviewed the patients History and Physical, chart, labs and discussed the procedure including the risks, benefits  and alternatives for the proposed anesthesia with the patient or authorized representative who has indicated his/her understanding and acceptance.     Dental advisory given  Plan Discussed with: CRNA and Anesthesiologist  Anesthesia Plan Comments:         Anesthesia Quick Evaluation

## 2020-12-30 NOTE — Op Note (Signed)
12/30/2020 10:43 AM  PATIENT: Stephanie Ewing  65 y.o. female  Patient Care Team: Monico Blitz, MD as PCP - General (Internal Medicine) Harl Bowie, Alphonse Guild, MD as PCP - Cardiology (Cardiology)  PRE-OPERATIVE DIAGNOSIS: History of biliary pancreatitis  POST-OPERATIVE DIAGNOSIS: Same  PROCEDURE: Laparoscopic cholecystectomy  SURGEON: Nadeen Landau, MD  ASSISTANT: Michaelle Birks, MD  ANESTHESIA: General endotracheal  EBL: Total I/O In: 900 [I.V.:800; IV Piggyback:100] Out: 5 [Blood:5]  DRAINS: None  SPECIMEN: Gallbladder  COUNTS: Sponge, needle and instrument counts were reported correct x2 at the conclusion of the operation  DISPOSITION: PACU in satisfactory condition  COMPLICATIONS: None  FINDINGS: Mild steatosis of liver. No evident gallbladder or liver lesions. Gallbladder without significant inflammation or wall abnormalities. Cholecystectomy carried out.  DESCRIPTION:  The patient was identified & brought into the operating room. She was then positioned supine on the OR table. SCDs were in place and active during the entire case. She then underwent general endotracheal anesthesia. Pressure points were padded. Hair on the abdomen was clipped by the OR team. The abdomen was prepped and draped in the standard sterile fashion. Antibiotics were administered. A surgical timeout was performed and confirmed our plan.  A infraumbilical incision was made. The umbilical stalk was grasped and retracted outwardly. The infraumbilical fascia was identified and incised. The peritoneal cavity was gently entered bluntly. A purse-string 0 Vicryl suture was placed. The Hasson cannula was inserted into the peritoneal cavity and insufflation with CO2 commenced to 60mHg. A laparoscope was inserted into the peritoneal cavity and inspection confirmed no evidence of trocar site complications. The patient was then positioned in reverse Trendelenburg with slight left side down. 3 additional 5674m trocars were placed along the right subcostal line - one 74m62mort in mid subcostal region, another 74mm71mrt in the right flank near the anterior axillary line, and a third 74mm 63mt in the left subxiphoid region obliquely near the falciform ligament.  The liver and gallbladder were inspected. The liver has mild steatosis but is otherwise normal in appearance. The gallbladder has a normal appearing wall and is not significantly diseased in appearance. The gallbladder fundus was grasped and elevated cephalad. An additional grasper was then placed on the infundibulum of the gallbladder and the infundibulum was retracted laterally. Staying high on the gallbladder, the peritoneum on both sides of the gallbladder was opened with hook cautery. Gentle blunt dissection was then employed with a MarylIT consultanting down into [place]ot[place] One cystic duct was identified and carefully circumferentially dissected. The cystic artery was also identified and carefully circumferentially dissected. The space between the cystic artery and hepatocystic plate was developed such that a good view of the liver could be seen through a window medial to the cystic artery. The triangle of Calot had been cleared of all fibrofatty tissue. At this point, a critical view of safety was achieved and the only structures visualized was the skeletonized cystic duct laterally, the skeletonized cystic artery and the liver through the window medial to the artery. No posterior cystic artery was noted  A cholangiogram was not performed at this point as the cystic duct is fairly small and fragile; advancement of the cholangiocatheter was felt to potentially increase the risk of back wall injury to her common bile duct or risk avulsion of cystic duct with 'partial ductotomy.'  The cystic duct and artery were clipped with 2 clips on the patient side and 1 clip on the specimen side. The cystic duct and artery were then  divided. The gallbladder  was then freed from its remaining attachments to the liver using electrocautery and placed into an endocatch bag. The RUQ was gently irrigated with sterile saline. Hemostasis was then verified. The clips were in good position; the gallbladder fossa was dry. The rest of the abdomen was inspected no injury nor bleeding elsewhere was identified.  The endocatch bag containing the gallbladder was then removed from the umbilical port site and passed off as specimen. The RUQ ports were removed under direct visualization and noted to be hemostatic. The umbilical fascia was then closed using the 0 Vicryl purse-string suture. The fascia was palpated and noted to be completely closed. The skin of all incision sites was approximated with 4-0 monocryl subcuticular suture and dermabond applied. She was then awakened from anesthesia, extubated, and transferred to a stretcher for transport to PACU in satisfactory condition.

## 2020-12-31 ENCOUNTER — Encounter (HOSPITAL_COMMUNITY): Payer: Self-pay | Admitting: Surgery

## 2020-12-31 LAB — SURGICAL PATHOLOGY

## 2021-03-03 ENCOUNTER — Encounter: Payer: Self-pay | Admitting: Cardiology

## 2021-03-03 ENCOUNTER — Ambulatory Visit (INDEPENDENT_AMBULATORY_CARE_PROVIDER_SITE_OTHER): Payer: Medicare Other | Admitting: Cardiology

## 2021-03-03 VITALS — BP 114/70 | HR 80 | Ht 67.0 in | Wt 187.0 lb

## 2021-03-03 DIAGNOSIS — I480 Paroxysmal atrial fibrillation: Secondary | ICD-10-CM

## 2021-03-03 DIAGNOSIS — I351 Nonrheumatic aortic (valve) insufficiency: Secondary | ICD-10-CM | POA: Diagnosis not present

## 2021-03-03 DIAGNOSIS — I152 Hypertension secondary to endocrine disorders: Secondary | ICD-10-CM | POA: Diagnosis not present

## 2021-03-03 NOTE — Progress Notes (Signed)
Clinical Summary Stephanie Ewing is a 65 y.o.femaleseen today for follow up of the following medical problems.    Previously followed by Dr Ruel Favors at [facility]     1. PAF - beta blocker stopped by endocrinology    CHADS2Vasc score (HTN, woman)2, has been on eliquis.  - she reports 2 episodes of afib in setting of hypokalemia. From chart review appears K was low to mid 2 range both times.   - 02/2017 event monitor without afib/aflutter, did have some PACs/PVCs, short runs SVT.  - anticoag was stopped due to isolated events with no clear recurrence        06/2020 event monitor SR and rare PVCs, no afib or aflutter - no recent palpitations - has dilt just prn, has not had to use  - no recent palpitaitons.      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 No evidence of renal artery stenosis by [place]     - compliant with meds     3. Hyperaldosteronism - followed at [facility]. Recs for medical management after renal vein renin sampling did not localize - aldactone increased to 100mg  daily - still followed at [facility]     4. Leg tremors - being worked up at [facility] by neurology     5. Mild to moderate AI - noted by echo 2018     -  10/2019 echo LVEF 60-65%, mod AI - no symptoms        6. 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 Lexiscan nuclear stress test was normal. 02/05/2017 nuclear stress test: Lexiscan nuclear stress tes tNo significant EKG changes. Perfusion scan is normal with a normal ejection fraction and no ischemia. This is considered low risk result     Retired respiratory  therapist   Past Medical History:  Diagnosis Date   Afib ([place])    Arthritis    psoratic   Asthma    "weak"    Constipation    Depression    Dysrhythmia    AFIb   GERD (gastroesophageal reflux disease)    History of hiatal hernia    Hyperaldosteronism (HCC)    Hypertension    Hypokalemia    Hypothyroidism    Insomnia    Migraine    ocassional   Nicotine dependence, cigarettes, uncomplicated    Pancreatitis    PONV (postoperative nausea and vomiting)    Pre-diabetes    Thyroid disease    hypothyroid     No Known Allergies   Current Outpatient Medications  Medication Sig Dispense Refill   acetaminophen (TYLENOL) 325 MG tablet Take 2 tablets (650 mg total) by mouth every 6 (six) hours. (Patient taking differently: Take 650 mg by mouth every 6 (six) hours as needed for mild pain.)     albuterol (VENTOLIN HFA) 108 (90 Base) MCG/ACT inhaler Inhale into the lungs every 6 (six) hours as needed for wheezing or shortness of breath.     aspirin EC 81 MG tablet Take 1 tablet (81 mg total) by mouth at bedtime. 30 tablet 11   azelastine (ASTELIN) 0.1 % nasal spray Place 1  spray into both nostrils at bedtime. Use in each nostril as directed     Cannabidiol POWD Apply 1 application topically as needed (pain). CBD Oil/CBD Cream     Cholecalciferol (VITAMIN D) 50 MCG (2000 UT) CAPS Take 2,000 Units by mouth in the morning.     diltiazem (CARDIZEM) 30 MG tablet Take 1 tablet every 4 hours AS NEEDED for heart rate >100 as long as top blood pressure >100. (Patient taking differently: Take 30 mg by mouth as directed. Take 1 tablet (30mg )  every 4 hours AS NEEDED for heart rate >100 as long as top blood pressure >100.) 45 tablet 1   diphenhydrAMINE (BENADRYL) 25 MG tablet Take 100 mg by mouth at bedtime.     fexofenadine (ALLEGRA) 180 MG tablet Take 180 mg by mouth in the morning.     fluticasone-salmeterol (ADVAIR HFA) 230-21 MCG/ACT inhaler Inhale 2 puffs into the lungs 2 (two) times daily.      gabapentin (NEURONTIN) 300 MG capsule Take 300 mg by mouth 3 (three) times daily.     Golimumab (SIMPONI ARIA IV) Inject 1 Dose into the vein as directed. Every 2 months     methocarbamol (ROBAXIN-750) 750 MG tablet Take 1 tablet (750 mg total) by mouth 3 (three) times daily as needed for muscle spasms. 90 tablet 1   omeprazole (PRILOSEC) 40 MG capsule TAKE 1 CAPSULE(40 MG) BY MOUTH TWICE DAILY BEFORE A MEAL 60 capsule 5   oxyCODONE-acetaminophen (PERCOCET) 7.5-325 MG tablet Take 1 tablet by mouth every 4 (four) hours as needed for severe pain. 60 tablet 0   polyethylene glycol (MIRALAX) 17 g packet Take 17 g by mouth daily as needed. (Patient taking differently: Take 17 g by mouth daily.) 14 each 0   promethazine (PHENERGAN) 12.5 MG tablet Take 12.5 mg by mouth 3 (three) times daily as needed for nausea/vomiting.     RESTASIS 0.05 % ophthalmic emulsion Place 1 drop into both eyes 2 (two) times daily.     sodium chloride (OCEAN) 0.65 % SOLN nasal spray Place 1 spray into both nostrils as needed for congestion.     spironolactone (ALDACTONE) 100 MG tablet Take 100 mg by mouth in the morning.     SYNTHROID 125 MCG tablet Take 125 mcg by mouth daily before breakfast.     traZODone (DESYREL) 100 MG tablet Take 100 mg by mouth at bedtime.     traZODone (DESYREL) 150 MG tablet Take 150 mg by mouth at bedtime.     No current facility-administered medications for this visit.     Past Surgical History:  Procedure Laterality Date   ANTERIOR CERVICAL DECOMPRESSION/DISCECTOMY FUSION 4 LEVELS N/A 07/20/2020   Procedure: ANTERIOR CERVICAL DECOMPRESSION FUSION CERVICAL FOUR-CERVICAL FIVE, CERVICAL FIVE-CERVICAL SIX, CERVICAL SIX-CERVICAL SEVEN;  Surgeon: Consuella Lose, MD;  Location: [place];  Service: Neurosurgery;  Laterality: N/A;   BIOPSY  06/28/2020   Procedure: BIOPSY;  Surgeon: Rush Landmark Telford Nab., MD;  Location: [facility];  Service: Gastroenterology;;   CHOLECYSTECTOMY N/A 12/30/2020    Procedure: LAPAROSCOPIC CHOLECYSTECTOMY;  Surgeon: Ileana Roup, MD;  Location: [facility];  Service: General;  Laterality: N/A;   ESOPHAGOGASTRODUODENOSCOPY (EGD) WITH PROPOFOL N/A 06/28/2020   Procedure: ESOPHAGOGASTRODUODENOSCOPY (EGD) WITH PROPOFOL;  Surgeon: Irving Copas., MD;  Location: [facility];  Service: Gastroenterology;  Laterality: N/A;   ESOPHAGOGASTRODUODENOSCOPY (EGD) WITH PROPOFOL N/A 08/05/2020   Procedure: ESOPHAGOGASTRODUODENOSCOPY (EGD) WITH PROPOFOL;  Surgeon: Rush Landmark Telford Nab., MD;  Location: [place];  Service: Gastroenterology;  Laterality:  N/A;   EUS N/A 06/28/2020   Procedure: UPPER ENDOSCOPIC ULTRASOUND (EUS) LINEAR;  Surgeon: Irving Copas., MD;  Location: [facility];  Service: Gastroenterology;  Laterality: N/A;   EUS N/A 08/05/2020   Procedure: UPPER ENDOSCOPIC ULTRASOUND (EUS) RADIAL;  Surgeon: Irving Copas., MD;  Location: [place];  Service: Gastroenterology;  Laterality: N/A;   FINE NEEDLE ASPIRATION  06/28/2020   Procedure: FINE NEEDLE ASPIRATION (FNA) LINEAR;  Surgeon: Irving Copas., MD;  Location: [facility];  Service: Gastroenterology;;   FINE NEEDLE ASPIRATION  08/05/2020   Procedure: FINE NEEDLE ASPIRATION;  Surgeon: Irving Copas., MD;  Location: [place];  Service: Gastroenterology;;   POLYPECTOMY  06/28/2020   Procedure: POLYPECTOMY;  Surgeon: Irving Copas., MD;  Location: [facility];  Service: Gastroenterology;;   SHOULDER SURGERY Bilateral    inpingement release   TOTAL ABDOMINAL HYSTERECTOMY  2015   ULNAR NERVE REPAIR Right      No Known Allergies    Family History  Problem Relation Age of Onset   Hypertension Mother    Thyroid disease Mother        hypothyroidism   Thyroid cancer Cousin    Asthma Father    Heart disease Father        from scarlet fever; died age 56 MVA   Hypertension Sister    Hypothyroidism Sister    Hypertension Maternal  Grandmother    Heart failure Maternal Grandmother    Diabetes Paternal Grandmother    Heart failure Paternal Grandmother      Social History Ms. Farina reports that she has been smoking cigarettes. She started smoking about 50 years ago. She has a 18.75 pack-year smoking history. She has never used smokeless tobacco. Ms. Kachel reports that she does not currently use alcohol.   Review of Systems CONSTITUTIONAL: No weight loss, fever, chills, weakness or fatigue.  HEENT: Eyes: No visual loss, blurred vision, double vision or yellow sclerae.No hearing loss, sneezing, congestion, runny nose or sore throat.  SKIN: No rash or itching.  CARDIOVASCULAR: per hpi RESPIRATORY: No shortness of breath, cough or sputum.  GASTROINTESTINAL: No anorexia, nausea, vomiting or diarrhea. No abdominal pain or blood.  GENITOURINARY: No burning on urination, no polyuria NEUROLOGICAL: No headache, dizziness, syncope, paralysis, ataxia, numbness or tingling in the extremities. No change in bowel or bladder control.  MUSCULOSKELETAL: No muscle, back pain, joint pain or stiffness.  LYMPHATICS: No enlarged nodes. No history of splenectomy.  PSYCHIATRIC: No history of depression or anxiety.  ENDOCRINOLOGIC: No reports of sweating, cold or heat intolerance. No polyuria or polydipsia.  Marland Kitchen   Physical Examination Today's Vitals   03/03/21 1340  BP: 114/70  Pulse: 80  SpO2: 97%  Weight: 187 lb (84.8 kg)  Height: 5\' 7"  (1.702 m)   Body mass index is 29.29 kg/m.  Gen: resting comfortably, no acute distress HEENT: no scleral icterus, pupils equal round and reactive, no palptable cervical adenopathy,  CV: RRR, no mr/g no jvd Resp: Clear to auscultation bilaterally GI: abdomen is soft, non-tender, non-distended, normal bowel sounds, no hepatosplenomegaly MSK: extremities are warm, no edema.  Skin: warm, no rash Neuro:  no focal deficits Psych: appropriate affect   Diagnostic Studies  01/2017 Nuclear  perfusion stress test: 1. Normal myocardial perfusion imaging without ischemia or infarction. 2. Normal post-stress LVEF, 55%. Normal regional wall motion. TID 0.93. 3. Patient achieved 10 METs by Bruce protocol without chest pain or EKG changes but could not reach heart rate target. With vasodilator  administration, no EKG changes. Patient reported chest pain with vasodilator, a non-specific finding. 4. Hypertensive response to exercise. 5. No prior study available for comparison. Electronically signed Dr Hurshel Party, MD 02/06/17 9:35 AM  01/2017 Echocardiogram: Mild sclerosis of the trileaflet aortic valve, with adequate leaflet motion. Mild-to-moderate aortic regurgitation is present. The left atrial size is normal. There is normal left ventricular size. Global left ventricular systolic function is normal. The estimated left ventricular ejection fraction is 60% Normal left ventricular diastolic filling pattern. Normal right ventricle size and function. Normal RVSP ---------------------------------------------------------------- Electronically signed by Ruel Favors W(Interpreting physician) on 01/18/2017 06:58 PM ----------------------------------------------------------------   02/2017 Thirty day event monitor: 1. Ventricular arrhythmias occasional PVCs with no couplets triplets or runs of ventricular tachycardia 2. Supraventricular arrhythmias occasional PACs with occasional short runs of nonsustained SVT 3. Atrial fibrillation/atrial flutter none 4. Bradycardia arrhythmias no significant Brady arrhythmias 5. Symptom correlation with arrhythmias patient's symptoms did occur with atrial and ventricular ectopy and short runs of nonsustained SVT  Impressions: Occasional PVCs with no high-grade ventricular ectopy Occasional PACs with occasional brief short runs of nonsustained SVT Symptoms occurred with PACs and PVCs and short runs of nonsustained SVT Overall no  hemodynamically significant arrhythmias present  Electronically signed by: Holly Bodily, DO, [facility]       Jan 2022 CTA IMPRESSION: 1. No aortic dissection or acute aortic abnormality. No pulmonary embolus. 2. Minor distal esophageal wall thickening, can be seen with reflux or esophagitis. 3. Improved peripancreatic fat stranding from last month. 4. Moderate colonic stool burden with tortuous sigmoid colon, suggesting constipation.   Assessment and Plan   1. PAF - 2 isolated episodes of afib in setting of severe hypokalemia. No clear recurence, including by a 30 day event monitor by her previous cardiologist and a repeat monitor here in 06/2020 - she is off anticoag, if clear recurrence would need to restart  - no recent symptoms, EKG today continues to shows NSR - continue to monitor   2. Resistant HTN - significnatly improved on aldactone, she was diagnosed with hyperaldo -bp's remain at goal, continue current meds   3. Aortic regurgitation - moderate by last echo - we will extend to 2 year intervals, recheck in 2023    Arnoldo Lenis, M.D.,

## 2021-03-03 NOTE — Patient Instructions (Signed)

## 2021-05-26 ENCOUNTER — Telehealth: Payer: Self-pay | Admitting: Gastroenterology

## 2021-05-26 NOTE — Telephone Encounter (Signed)
Patient called and stated that [facility] had got In contact with Dr. Rush Landmark about patient having a Endoscopy in march. Patient wanting to schedule. Please advise.

## 2021-05-26 NOTE — Telephone Encounter (Signed)
The pt states that [facility] would like the pt to have an EGD? In March.  Dr Havery Moros and Mansouraty please advise if she is due for EUS or EGD and with whom should it be scheduled.

## 2021-05-26 NOTE — Telephone Encounter (Signed)
Stephanie Ewing we have not seen her in a while and she has not had follow up with [facility] in several months, I don't see where they recommended a repeat EGD. She may need a follow up EUS but if you can book her with me for an office visit I can review all of this with her and discuss plans moving forward. Thanks

## 2021-05-27 NOTE — Telephone Encounter (Signed)
The pt has been advised of the appt on 2/16 but asked that it get moved to 2/21 at 2:10 pm.  I have rescheduled the appt. The pt has been advised of the information and verbalized understanding.

## 2021-05-27 NOTE — Telephone Encounter (Signed)
The pt has been scheduled for 2/16 at 830 am with Dr Havery Moros.  Left message on machine to call back

## 2021-05-30 NOTE — Telephone Encounter (Signed)
Agree with the clinic visit with Dr. Havery Moros. I think the patient was thinking that repeat imaging due to the small cyst that was noted on last EUS at 1 year follow-up is likely the follow-up that she needs. 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 Please let me know if there is anything else that I can be of assistance with. Thanks. GM

## 2021-06-26 ENCOUNTER — Other Ambulatory Visit: Payer: Self-pay | Admitting: Gastroenterology

## 2021-06-30 ENCOUNTER — Ambulatory Visit: Payer: Medicare Other | Admitting: Gastroenterology

## 2021-07-05 ENCOUNTER — Ambulatory Visit (INDEPENDENT_AMBULATORY_CARE_PROVIDER_SITE_OTHER): Payer: Medicare Other | Admitting: Gastroenterology

## 2021-07-05 ENCOUNTER — Encounter: Payer: Self-pay | Admitting: Gastroenterology

## 2021-07-05 VITALS — BP 140/80 | HR 76 | Ht 67.0 in | Wt 181.6 lb

## 2021-07-05 DIAGNOSIS — Z79899 Other long term (current) drug therapy: Secondary | ICD-10-CM

## 2021-07-05 DIAGNOSIS — K219 Gastro-esophageal reflux disease without esophagitis: Secondary | ICD-10-CM

## 2021-07-05 DIAGNOSIS — K859 Acute pancreatitis without necrosis or infection, unspecified: Secondary | ICD-10-CM | POA: Diagnosis not present

## 2021-07-05 DIAGNOSIS — K5900 Constipation, unspecified: Secondary | ICD-10-CM

## 2021-07-05 DIAGNOSIS — R933 Abnormal findings on diagnostic imaging of other parts of digestive tract: Secondary | ICD-10-CM

## 2021-07-05 DIAGNOSIS — Z86010 Personal history of colonic polyps: Secondary | ICD-10-CM

## 2021-07-05 MED ORDER — OMEPRAZOLE 40 MG PO CPDR: 40.0000 mg | DELAYED_RELEASE_CAPSULE | Freq: Two times a day (BID) | ORAL | 3 refills | Status: DC | PRN

## 2021-07-05 MED ORDER — LINACLOTIDE 145 MCG PO CAPS: 145.0000 ug | ORAL_CAPSULE | Freq: Every day | ORAL | 0 refills | Status: DC

## 2021-07-05 NOTE — Progress Notes (Signed)
HPI :  66 year old female here for follow-up visit for history of pancreatitis and abnormal imaging of her pancreas.  Recall that I first met her as an inpatient consult in December 2021 when she was hospitalized for pancreatitis. Recall that she has a history of psoriatic arthritis. She was placed on methotrexate in early September. She developed pancreatitis and was hospitalized from November 30 to December 2. Ultrasound at that time showed an unremarkable gallbladder and CBD. Her liver enzymes were normal. She had a CT scan with contrast at that time showing pancreatic head and proximal body edema with peripancreatic fat stranding consistent with acute pancreatitis. She was treated conservatively.  Unfortunately was readmitted at the end of that month with recurrence, a repeat CT scan showed peripancreatic infiltration at the head of the pancreas consistent with focal pancreatitis. She was again treated conservatively and ultimately improved and discharged.   She has not been drinking any alcohol and never has. She had lipid testing which was okay. Her great grandfather had pancreatic cancer but no other family history of pancreatitis or pancreatic cancer. She had been taking Singulair which has been rarely reported to cause pancreatitis and we recommended that she stop it at the time of her last admission. She had an IgG4 sent which was normal.   After our clinic visit I recommended a follow-up MRCP in February 2022.  That exam was remarkable for a roughly 3.4 cm pancreatic head mass.  She subsequently was referred to Dr. Rush Landmark for EUS which was done in February 2022 report as below, she had a heterogeneous region in the pancreatic head, not clearly a mass, biopsied with FNA which showed "atypical cells".  She had a follow-up repeat EUS with Dr. Rush Landmark in March 2022, again biopsied and rare atypical cells noted, no evidence of malignancy.  She inquired about a second opinion and was referred  to [facility].  She had another EUS in April at [facility], no mass was noted.  She incidentally had 2 small cysts in her pancreas.  Also on prior EUS's she has noted to have gallstones.  In light of lack of clear cause for her pancreatitis she underwent cholecystectomy in August of last year, recovered from that okay.  She since has not had any problems with her pancreas that she is aware of.  Has been doing well without any recurrent episodes of pancreatitis.  She denies any abdominal pains.  She is having frequent reflux symptoms, takes omeprazole 40 mg once daily to twice daily.  At twice daily dosing she feels much better than once daily so she usually takes it twice if she can remember it.  No dysphagia.  She did have a hiatal hernia and some mild esophagitis noted on prior EGDs which healed up on PPI.  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 Rush Landmark on one of the EGDs.  Recall that she had a colonoscopy in 2013 with 6 polyps removed. Her last colonoscopy was in 2018 and she do not have any polyps but her GI provider recommended a 5-year follow-up for her history of polyps previously. No records of her 2013 exam. This exam was done in [facility].  She does have some chronic constipation, using MiraLAX once daily to twice daily, but still has problems with this.  No blood in the stool.   She otherwise does have a history of chronic tobacco use.   Prior work-up: CTA 05/26/20 - IMPRESSION: 1. No aortic  dissection or acute aortic abnormality. No pulmonary embolus. 2. Minor distal esophageal wall thickening, can be seen with reflux or esophagitis. 3. Improved peripancreatic fat stranding from last month. 4. Moderate colonic stool burden with tortuous sigmoid colon, suggesting constipation.      MRCP 06/23/20:IMPRESSION: 1. Findings are highly concerning for primary pancreatic neoplasm in the inferior aspect of the pancreatic head measuring approximately 3.4 x 2.2 x  2.4 cm. This exerts mild mass effect upon the adjacent distal pancreatic duct and distal common bile duct just proximal to the ampulla, without frank ductal obstruction at this time. The possibility of focal pancreatitis is not excluded, however, further evaluation with endoscopic ultrasound is recommended in the near future to better evaluate this region and assess need for potential biopsy. 2. Multiple tiny subcentimeter cystic appearing lesions scattered elsewhere throughout the pancreas, favored to represent small benign lesions such as side-branch IPMN and/or small pancreatic pseudocysts. Repeat abdominal MRI with and without IV gadolinium with MRCP is recommended in 1 year to ensure stability of these lesions. This recommendation follows ACR consensus guidelines: Management of Incidental Pancreatic Cysts: A White Paper of the ACR Incidental Findings Committee. [place] 5400;86:[id number].    EUS 06/28/20: EGD Impression: - No gross lesions in esophagus proximally. LA Grade B esophagitis with no bleeding distally. - Shatzki ring present. - Hiatal hernia noted. - Erythematous mucosa in the gastric body, incisura and antrum. No other gross lesions in the stomach. Biopsied. - Duodenal polyp in D2 noted. This was removed with cold snare polypectomy. - No gross lesions in the duodenal bulb, in the first portion of the duodenum and in the second portion of the duodenum. Biopsied. - Normal major papilla. EUS Impression: - A heterogenous region/lesion was identified in the pancreatic head. However, the endosonographic appearance is not typical for adenocarcinoma. This could be inflammatory in nature. But with MRI findings and unexplained pancreatitis, FNB was performed. If malignancy is identified, this will be staged T2 N0 Mx by endosonographic criteria. - Multiple cystic lesions were seen in the pancreatic head, pancreatic body and pancreatic tail. Tissue has not been obtained.  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 - There was no sign of significant pathology in the common bile duct and in the common hepatic duct. - No malignant-appearing lymph nodes were visualized in the celiac region (level 20), peripancreatic region and porta hepatis region.  FINAL MICROSCOPIC DIAGNOSIS:   A. STOMACH, BIOPSY:  - Mild chronic gastritis.  - Warthin-Starry stain is negative for Helicobacter pylori.   B. DUODENUM, BIOPSY:  - No significant histopathologic findings.  - Negative for increased intraepithelial lymphocytes and villous  architectural changes.   C. DUODENUM, POLYPECTOMY:  - Adenoma.  - Negative for high grade dysplasia.   FINAL MICROSCOPIC DIAGNOSIS:  - Atypical cells present    EUS 08/05/20: EGD Impression: - No gross lesions in esophagus. Non-obstructing Schatzki ring. - 2 cm hiatal hernia. - No gross lesions in the stomach. - No gross lesions in the duodenal bulb, in the first portion of the duodenum and in the second portion of the duodenum. EUS Impression: - A region of hypointensity was identified in the pancreatic head. This region looks improved and smaller than prior procedure. Because of previous atypical cells noted on lastEUS FNB, tissue was obtained from this exam, and results are pending to try to rule out a malignant process. However, the endosonographic appearance today, with improving features, seems to make the possibility of  inflammatory changes more possible though malignancy is not ruled out as of yet. If this turns out to be a malignant process, then this will be staged T1 N0 Mx by endosonographic criteria. Fine needle biopsy performed. - Multiple stones were visualized endosonographically in the gallbladder. - There was no sign of significant pathology in the common bile duct and in the common hepatic duct (smaller than previous EUS). - No malignant-appearing lymph  nodes were visualized in the celiac region (level 20), peripancreatic region and porta hepatis region.  CYTOLOGY - NON PAP  CASE: [id number]  PATIENT: Stephanie Ewing  Non-Gynecological Cytology Report  Clinical History: Pancreatic mass  FINAL MICROSCOPIC DIAGNOSIS:   A. PANCREAS, HEAD, FINE NEEDLE ASPIRATION:  -  Rare atypical cells present  -  See comment   COMMENT:   There are atypical cells present; insufficient for diagnosis of  malignancy.  In addition, there is minimal inflammation present.  Dr.  Jeannie Done reviewed the case and agrees with the above diagnosis.     Past Medical History:  Diagnosis Date   Afib ([place])    Arthritis    psoratic   Asthma    "weak"    Constipation    Depression    Dysrhythmia    AFIb   GERD (gastroesophageal reflux disease)    History of hiatal hernia    Hyperaldosteronism (H[facility])    Hypertension    Hypokalemia    Hypothyroidism    Insomnia    Migraine    ocassional   Nicotine dependence, cigarettes, uncomplicated    Pancreatitis    PONV (postoperative nausea and vomiting)    Pre-diabetes    Thyroid disease    hypothyroid     Past Surgical History:  Procedure Laterality Date   ANTERIOR CERVICAL DECOMPRESSION/DISCECTOMY FUSION 4 LEVELS N/A 07/20/2020   Procedure: ANTERIOR CERVICAL DECOMPRESSION FUSION CERVICAL FOUR-CERVICAL FIVE, CERVICAL FIVE-CERVICAL SIX, CERVICAL SIX-CERVICAL SEVEN;  Surgeon: Consuella Lose, MD;  Location: [place];  Service: Neurosurgery;  Laterality: N/A;   BIOPSY  06/28/2020   Procedure: BIOPSY;  Surgeon: Rush Landmark Telford Nab., MD;  Location: [facility];  Service: Gastroenterology;;   CHOLECYSTECTOMY N/A 12/30/2020   Procedure: LAPAROSCOPIC CHOLECYSTECTOMY;  Surgeon: Ileana Roup, MD;  Location: [facility];  Service: General;  Laterality: N/A;   elbow surgery Right    ESOPHAGOGASTRODUODENOSCOPY (EGD) WITH PROPOFOL N/A 06/28/2020   Procedure: ESOPHAGOGASTRODUODENOSCOPY (EGD) WITH PROPOFOL;   Surgeon: Irving Copas., MD;  Location: [facility];  Service: Gastroenterology;  Laterality: N/A;   ESOPHAGOGASTRODUODENOSCOPY (EGD) WITH PROPOFOL N/A 08/05/2020   Procedure: ESOPHAGOGASTRODUODENOSCOPY (EGD) WITH PROPOFOL;  Surgeon: Rush Landmark Telford Nab., MD;  Location: [place];  Service: Gastroenterology;  Laterality: N/A;   EUS N/A 06/28/2020   Procedure: UPPER ENDOSCOPIC ULTRASOUND (EUS) LINEAR;  Surgeon: Irving Copas., MD;  Location: [facility];  Service: Gastroenterology;  Laterality: N/A;   EUS N/A 08/05/2020   Procedure: UPPER ENDOSCOPIC ULTRASOUND (EUS) RADIAL;  Surgeon: Irving Copas., MD;  Location: [place];  Service: Gastroenterology;  Laterality: N/A;   FINE NEEDLE ASPIRATION  06/28/2020   Procedure: FINE NEEDLE ASPIRATION (FNA) LINEAR;  Surgeon: Irving Copas., MD;  Location: [facility];  Service: Gastroenterology;;   FINE NEEDLE ASPIRATION  08/05/2020   Procedure: FINE NEEDLE ASPIRATION;  Surgeon: Irving Copas., MD;  Location: [place];  Service: Gastroenterology;;   POLYPECTOMY  06/28/2020   Procedure: POLYPECTOMY;  Surgeon: Irving Copas., MD;  Location: [facility];  Service: Gastroenterology;;   SHOULDER SURGERY Bilateral  inpingement release   TOTAL ABDOMINAL HYSTERECTOMY  2015   ULNAR NERVE REPAIR Right    Family History  Problem Relation Age of Onset   Hypertension Mother    Thyroid disease Mother        hypothyroidism   Asthma Father    Heart disease Father        from scarlet fever; died age 77 MVA   Hypertension Sister    Hypothyroidism Sister    Hypertension Maternal Grandmother    Heart failure Maternal Grandmother    Diabetes Paternal Grandmother    Heart failure Paternal Grandmother    Thyroid cancer Cousin    Other Maternal Aunt        fatty liver   Colon cancer Neg Hx    Esophageal cancer Neg Hx    Pancreatic cancer Neg Hx    Stomach cancer Neg Hx    Social History    Tobacco Use   Smoking status: Every Day    Packs/day: 0.75    Years: 25.00    Pack years: 18.75    Types: Cigarettes    Start date: 03/12/1971   Smokeless tobacco: Never  Vaping Use   Vaping Use: Never used  Substance Use Topics   Alcohol use: Not Currently   Drug use: Never   Current Outpatient Medications  Medication Sig Dispense Refill   acetaminophen (TYLENOL) 325 MG tablet Take 2 tablets (650 mg total) by mouth every 6 (six) hours. (Patient taking differently: Take 650 mg by mouth every 6 (six) hours as needed for mild pain.)     albuterol (VENTOLIN HFA) 108 (90 Base) MCG/ACT inhaler Inhale into the lungs every 6 (six) hours as needed for wheezing or shortness of breath.     aspirin EC 81 MG tablet Take 1 tablet (81 mg total) by mouth at bedtime. 30 tablet 11   azelastine (ASTELIN) 0.1 % nasal spray Place 1 spray into both nostrils at bedtime. Use in each nostril as directed     b complex vitamins capsule Take 1 capsule by mouth daily.     Cannabidiol POWD Apply 1 application topically as needed (pain). CBD Oil/CBD Cream     Cholecalciferol (VITAMIN D) 50 MCG (2000 UT) CAPS Take 2,000 Units by mouth in the morning.     diltiazem (CARDIZEM) 30 MG tablet Take 1 tablet every 4 hours AS NEEDED for heart rate >100 as long as top blood pressure >100. (Patient taking differently: Take 30 mg by mouth as directed. Take 1 tablet (50m)  every 4 hours AS NEEDED for heart rate >100 as long as top blood pressure >100.) 45 tablet 1   diphenhydrAMINE (BENADRYL) 25 MG tablet Take 100 mg by mouth at bedtime.     fexofenadine (ALLEGRA) 180 MG tablet Take 180 mg by mouth in the morning.     fluticasone-salmeterol (ADVAIR HFA) 230-21 MCG/ACT inhaler Inhale 2 puffs into the lungs 2 (two) times daily.     gabapentin (NEURONTIN) 300 MG capsule Take 300 mg by mouth 3 (three) times daily.     Golimumab (SIMPONI ARIA IV) Inject 1 Dose into the vein as directed. Every 2 months     methocarbamol  (ROBAXIN-750) 750 MG tablet Take 1 tablet (750 mg total) by mouth 3 (three) times daily as needed for muscle spasms. 90 tablet 1   omeprazole (PRILOSEC) 40 MG capsule TAKE 1 CAPSULE(40 MG) BY MOUTH TWICE DAILY BEFORE A MEAL 60 capsule 1   oxyCODONE-acetaminophen (PERCOCET) 7.5-325 MG tablet Take  1 tablet by mouth every 4 (four) hours as needed for severe pain. 60 tablet 0   polyethylene glycol (MIRALAX) 17 g packet Take 17 g by mouth daily as needed. (Patient taking differently: Take 17 g by mouth daily.) 14 each 0   promethazine (PHENERGAN) 12.5 MG tablet Take 12.5 mg by mouth 3 (three) times daily as needed for nausea/vomiting.     RESTASIS 0.05 % ophthalmic emulsion Place 1 drop into both eyes 2 (two) times daily.     sodium chloride (OCEAN) 0.65 % SOLN nasal spray Place 1 spray into both nostrils as needed for congestion.     spironolactone (ALDACTONE) 100 MG tablet Take 100 mg by mouth in the morning.     SYNTHROID 125 MCG tablet Take 125 mcg by mouth daily before breakfast.     traZODone (DESYREL) 100 MG tablet Take 100 mg by mouth at bedtime.     traZODone (DESYREL) 150 MG tablet Take 150 mg by mouth at bedtime.     No current facility-administered medications for this visit.   No Known Allergies   Review of Systems: All systems reviewed and negative except where noted in HPI.    Lab Results  Component Value Date   WBC 5.5 12/30/2020   HGB 15.9 (H) 12/30/2020   HCT 48.3 (H) 12/30/2020   MCV 91.7 12/30/2020   PLT 222 12/30/2020    Lab Results  Component Value Date   CREATININE 1.27 (H) 12/30/2020   BUN 24 (H) 12/30/2020   NA 140 12/30/2020   K 4.1 12/30/2020   CL 105 12/30/2020   CO2 27 12/30/2020    Lab Results  Component Value Date   ALT 30 12/14/2020   AST 20 12/14/2020   ALKPHOS 55 12/14/2020   BILITOT 0.6 12/14/2020     Physical Exam: BP 140/80    Pulse 76    Ht '5\' 7"'  (1.702 m)    Wt 181 lb 9.6 oz (82.4 kg)    LMP  (LMP Unknown)    BMI 28.44 kg/m   Constitutional: Pleasant,well-developed, female in no acute distress. Abdominal: Soft, nondistended, nontender. There are no masses palpable. Extremities: no edema Neurological: Alert and oriented to person place and time. Psychiatric: Normal mood and affect. Behavior is normal.   ASSESSMENT AND PLAN: 66 year old female here for reassessment of the following:  Abnormal pancreas imaging History of acute pancreatitis GERD Long term use of PPI Constipation History of colon polyps  As above, initially unclear cause of pancreatitis, her liver enzymes were normal with no gallstones in her gallbladder at that time but gallstones were noted on follow-up EUS and she ultimately underwent cholecystectomy.  She had no recurrence of pancreatitis.  As follow-up of her pancreatitis MRCP showed a suspected pancreatic mass, this led to 3 different EUS's and ultimately no evidence of malignancy noted, although a repeat imaging was recommended 1 year after her last exam.  We discussed her course, she is otherwise feeling pretty well and has recovered from this.  There has been no evidence of malignancy in the last 3 EUS which is reassuring, noted to have small cysts.  Recommend repeat cross-sectional imaging at this point time to reassess her pancreas, we discussed doing an MRCP and she is agreeable to do that.  As for her reflux, she is doing well on omeprazole 40 mg twice daily.  We reviewed risks of chronic PPI therapy, she feels she needs this dose to control symptoms, long-term want to use lowest dose needed  to control symptoms but currently will continue 40 mg twice daily as needed.  Her esophagitis had healed up on PPI, no Barrett's.  We discussed her chronic constipation, provided her some Linzess samples 145 mcg/day to see if that helps, she can continue that as needed.  Otherwise continue MiraLAX and titrate up as needed.  She can stop her stool softener.  She is due for surveillance colonoscopy this  May for history of polyps  Plan: - schedule MRCP   - discussed risks of PPI, refilled omeprazole at 9m BID PRN  - provided Linzess 1416m samples  - Miralax BID  PRN - stop stool softener - Colonoscopy in May for surveillance of polyps  StJolly MangoMD [facility]

## 2021-07-05 NOTE — Patient Instructions (Addendum)
If you are age 67 or older, your body mass index should be between 23-30. Your Body mass index is 28.44 kg/m. If this is out of the aforementioned range listed, please consider follow up with your Primary Care Provider.  If you are age 46 or younger, your body mass index should be between 19-25. Your Body mass index is 28.44 kg/m. If this is out of the aformentioned range listed, please consider follow up with your Primary Care Provider.   ________________________________________________________  The [facility] providers would like to encourage you to use [facility] to communicate with providers for non-urgent requests or questions.  Due to long hold times on the telephone, sending your provider a message by [facility] may be a faster and more efficient way to get a response.  Please allow 48 business hours for a response.  Please remember that this is for non-urgent requests.  _______________________________________________________  Stop Stool Softener.  We have sent the following medications to your pharmacy for you to pick up at your convenience: Omeprazole: Take twice daily as needed  We have given you samples of the following medication to take: Linzess 145 mcg: Take one capsule daily before breakfast  Use Miralax, over-the-counter as needed.  You have been scheduled for an MRCP at  [facility], 1st floor, Radiology. Your appointment is scheduled on Thursday, 3-9 at 10:00 am. Please arrive 30 minutes prior to your appointment time for registration purposes. Please make certain not to have anything to eat or drink 4 hours prior to your test. In addition, if you have any metal in your body, have a pacemaker or defibrillator, please be sure to let your ordering physician know. This test typically takes 45 minutes to 1 hour to complete. Should you need to reschedule, please call [phone].  Please call next month to schedule a colonoscopy in May since our May schedule is not currently  open:  [phone].  I will send you instructions via MyChart once you get scheduled.  Thank you for entrusting me with your care and for choosing [facility], Dr. [place] Cellar

## 2021-07-14 ENCOUNTER — Telehealth: Payer: Self-pay | Admitting: Gastroenterology

## 2021-07-14 NOTE — Telephone Encounter (Signed)
I tried to call her back and her mail box is full and not accepting messages. Will try again tomorrow. ?

## 2021-07-14 NOTE — Telephone Encounter (Signed)
Patient called to follow up on the Portal and she said its only working a little and she thinks she might need a higher dosage. ?

## 2021-07-14 NOTE — Telephone Encounter (Signed)
Thanks PJ she can take 2 linzess per day 250mcg and see if that works better. If so, we can give her a script for that, whichever she prefers. Thanks ?

## 2021-07-14 NOTE — Telephone Encounter (Signed)
Spoke with Stephanie Ewing, she said it took the Linzess 145 mcg 6 days to work but now she is going daily. Today she has already been 3 times. She wants to try a higher dose to see if it will cause her to have one complete bowel movement and not so many little ones to feel cleaned out. Please advise Sir? ?

## 2021-07-15 MED ORDER — LINACLOTIDE 290 MCG PO CAPS: 290.0000 ug | ORAL_CAPSULE | Freq: Every day | ORAL | 0 refills | Status: DC

## 2021-07-15 NOTE — Telephone Encounter (Signed)
I spoke with Lalita and she only has 3 of the 153mcg Linzess left so she is going to come and get some 290 mcg Linzess samples to try. ?

## 2021-07-21 ENCOUNTER — Other Ambulatory Visit: Payer: Self-pay | Admitting: Gastroenterology

## 2021-07-21 ENCOUNTER — Ambulatory Visit (HOSPITAL_COMMUNITY)
Admission: RE | Admit: 2021-07-21 | Discharge: 2021-07-21 | Disposition: A | Discharge status: Home / Self Care | Payer: Medicare Other | Source: Ambulatory Visit | Attending: Gastroenterology | Admitting: Gastroenterology

## 2021-07-21 ENCOUNTER — Other Ambulatory Visit: Payer: Self-pay

## 2021-07-21 DIAGNOSIS — R933 Abnormal findings on diagnostic imaging of other parts of digestive tract: Secondary | ICD-10-CM

## 2021-07-21 DIAGNOSIS — K760 Fatty (change of) liver, not elsewhere classified: Secondary | ICD-10-CM

## 2021-07-21 DIAGNOSIS — K859 Acute pancreatitis without necrosis or infection, unspecified: Secondary | ICD-10-CM | POA: Insufficient documentation

## 2021-07-21 IMAGING — MR MR ABDOMEN WO/W CM MRCP
21 of 22 series · 45 of 48 positions shown · IV contrast (gadavist)
Comparison: CT abdomen and pelvis [DATE]

CLINICAL DATA: History of pancreatitis

EXAM:
MRI ABDOMEN WITHOUT AND WITH CONTRAST (INCLUDING MRCP)
TECHNIQUE: Multiplanar multisequence MR imaging of the abdomen was performed
both before and after the administration of intravenous contrast.
Heavily T2-weighted images of the biliary and pancreatic ducts were
obtained, and three-dimensional MRCP images were rendered by post
processing.
CONTRAST:  8mL GADAVIST GADOBUTROL 1 MMOL/ML IV SOLN

[Series 3: cor haste · coronal · 6.0mm · 1.25mm/px · 1 of 28 slices shown]
[im 1/28]
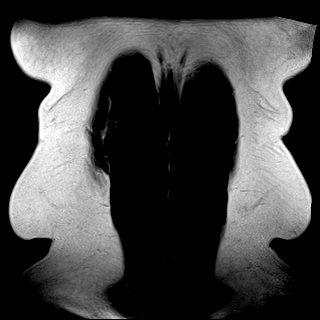

[Series 4: T2 fat-sat · axial · 6.0mm · 1.19mm/px · 1 of 30 slices shown]
[im 1/30]
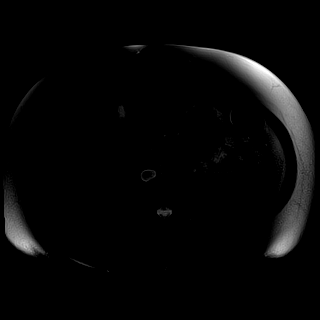

[Series 5: DWI · axial · 6.0mm · 1.42mm/px · 1 of 30 slices shown (1 of 4)]
[im 1/30]
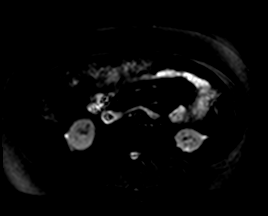

[Series 5: DWI · axial · 6.0mm · 1.42mm/px · 1 of 30 slices shown (2 of 4)]
[im 1/30]
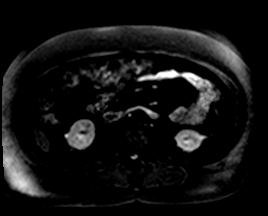

[Series 5: DWI · axial · 6.0mm · 1.42mm/px · 1 of 30 slices shown (3 of 4)]
[im 1/30]
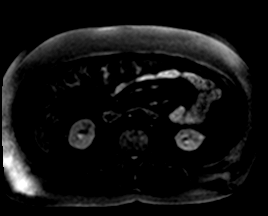

[Series 6: DWI · axial · 6.0mm · 1.42mm/px · 1 of 30 slices shown (4 of 4)]
[im 1/30]
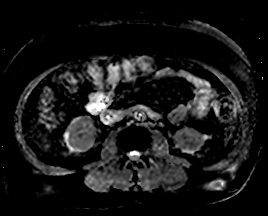

[Series 9: ax haste bh · axial · 6.0mm · 1.19mm/px · 1 of 30 slices shown]
[im 1/30]
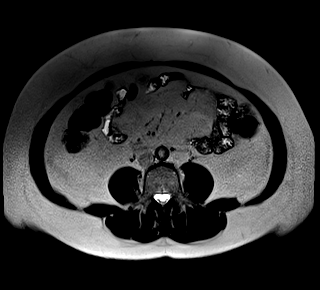

[Series 10: ax in and · axial · 3.0mm · 1.25mm/px · z∈[-163,+50]mm · 3 of 72 slices shown (1 of 2)]
[im 1/72]
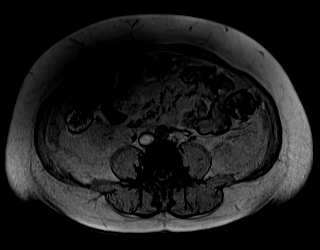
[im 36/72]
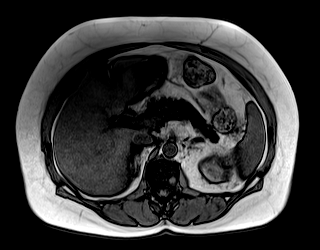
[im 72/72]
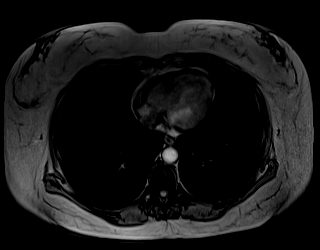

[Series 11: ax in and · axial · 3.0mm · 1.25mm/px · z∈[-163,+50]mm · 3 of 72 slices shown (2 of 2)]
[im 1/72]
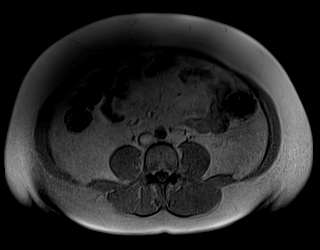
[im 36/72]
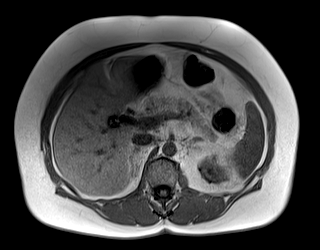
[im 72/72]
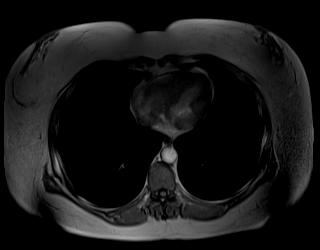

[Series 16: MRCP · coronal · 50.0mm · 0.78mm/px · 1 of 5 slices shown (1 of 2)]
[im 1/5]
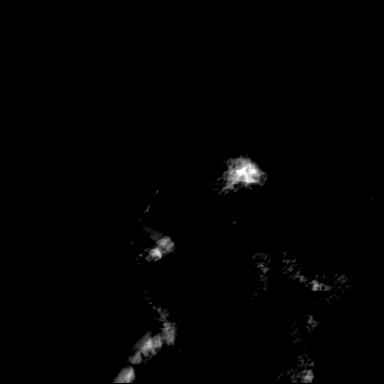

[Series 17: MRCP · coronal · 4.0mm · 1.12mm/px · 1 of 15 slices shown (2 of 2)]
[im 1/15]
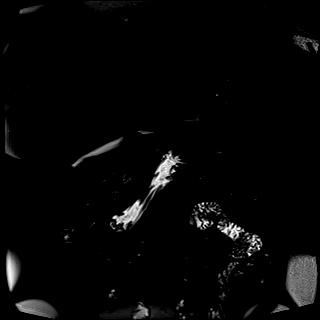

[Series 18: T1 dynamic · axial · 3.0mm · 1.19mm/px · z∈[-163,+50]mm · 3 of 72 slices shown (1 of 7)]
[im 1/72]
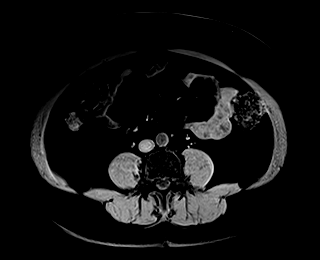
[im 36/72]
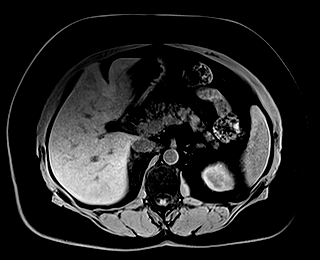
[im 72/72]
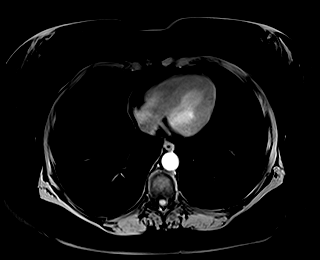

[Series 20: T1 dynamic · axial · 3.0mm · 1.19mm/px · z∈[-163,+50]mm · 3 of 72 slices shown (2 of 7)]
[im 1/72]
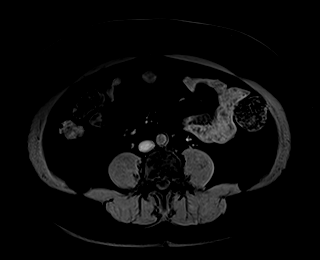
[im 36/72]
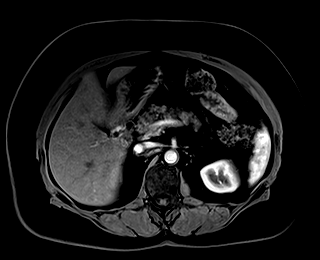
[im 72/72]
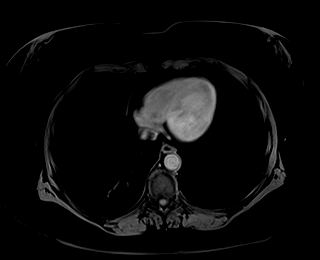

[Series 21: T1 dynamic · axial · 3.0mm · 1.19mm/px · z∈[-163,+50]mm · 3 of 72 slices shown (3 of 7)]
[im 1/72]
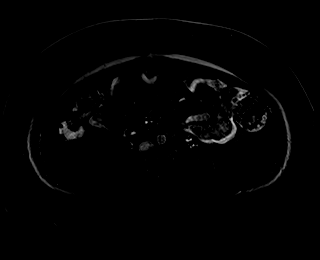
[im 36/72]
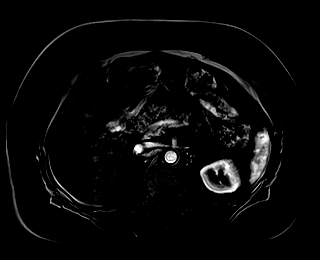
[im 72/72]
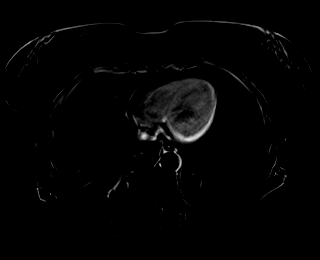

[Series 22: T1 dynamic · axial · 3.0mm · 1.19mm/px · z∈[-163,+50]mm · 3 of 72 slices shown (4 of 7)]
[im 1/72]
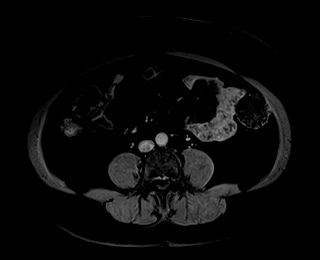
[im 36/72]
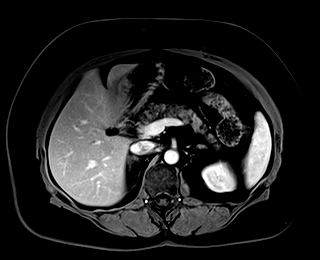
[im 72/72]
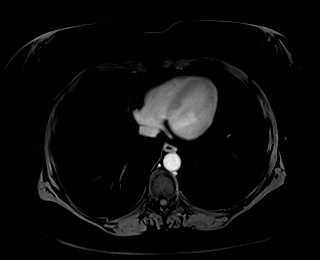

[Series 23: T1 dynamic · axial · 3.0mm · 1.19mm/px · z∈[-163,+50]mm · 3 of 72 slices shown (5 of 7)]
[im 1/72]
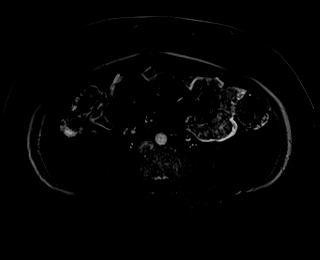
[im 36/72]
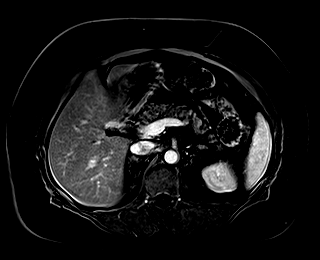
[im 72/72]
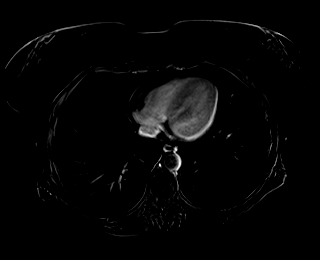

[Series 24: T1 dynamic · axial · 3.0mm · 1.19mm/px · z∈[-163,+50]mm · 3 of 72 slices shown (6 of 7)]
[im 1/72]
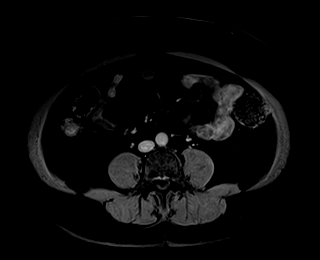
[im 36/72]
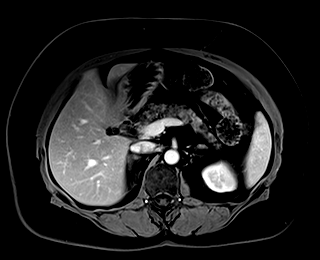
[im 72/72]
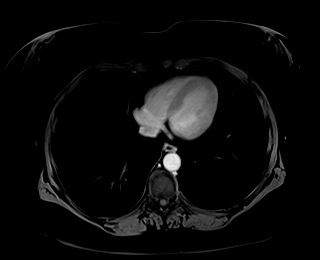

[Series 25: T1 dynamic · axial · 3.0mm · 1.19mm/px · z∈[-163,+50]mm · 3 of 72 slices shown (7 of 7)]
[im 1/72]
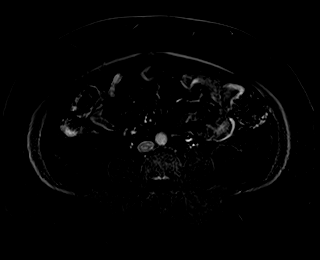
[im 36/72]
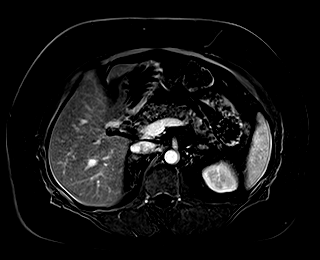
[im 72/72]
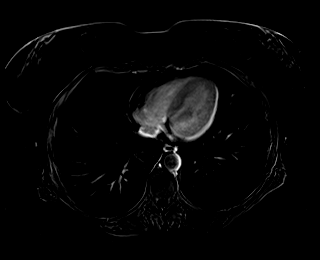

[Series 26: T1 dynamic post-contrast · coronal · 3.0mm · 1.31mm/px · 3 of 72 slices shown (1 of 3)]
[im 1/72]
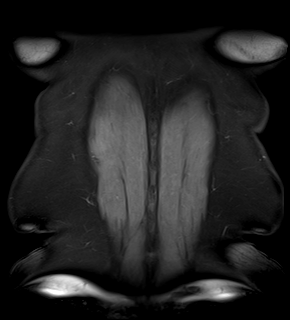
[im 36/72]
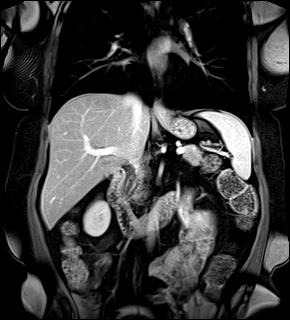
[im 72/72]
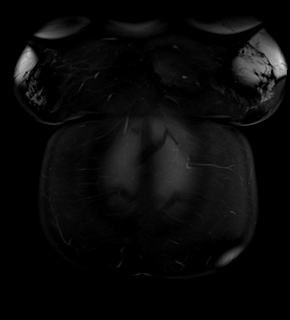

[Series 27: T1 dynamic post-contrast · axial · 3.0mm · 1.19mm/px · z∈[-163,+50]mm · 3 of 72 slices shown (2 of 3)]
[im 1/72]
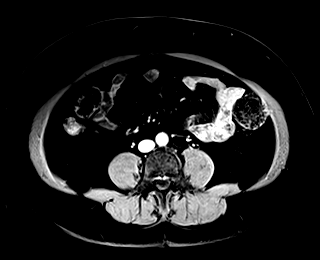
[im 36/72]
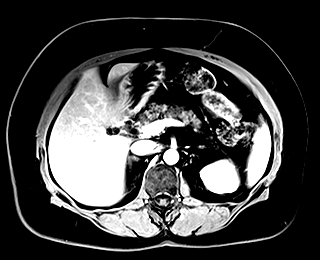
[im 72/72]
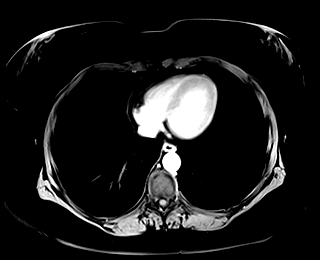

[Series 28: T1 dynamic post-contrast · axial · 3.0mm · 1.19mm/px · z∈[-163,+50]mm · 3 of 72 slices shown (3 of 3)]
[im 1/72]
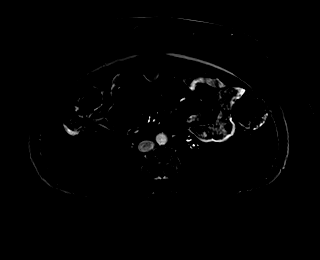
[im 36/72]
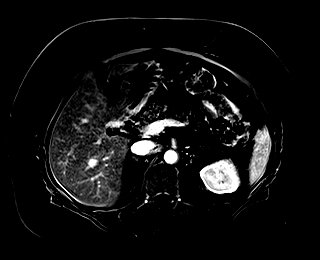
[im 72/72]
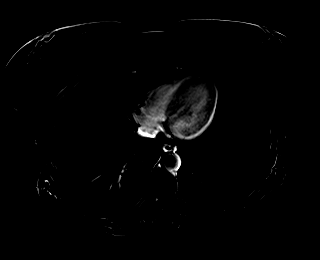

[45 of 48 positions shown; findings below may reference images not displayed]

FINDINGS: Lower chest: No acute findings.

Hepatobiliary: Liver is enlarged measuring 19.1 cm in length. There
is evidence of hepatic steatosis. No suspicious hepatic mass
identified. Gallbladder is surgically absent. No biliary ductal
dilatation.

Pancreas: Evidence of fatty infiltration and atrophy in the pancreas
head and proximal body. No solid pancreatic mass identified. Main
pancreatic duct is normal caliber. There are several subcentimeter
cystic foci throughout the pancreas measuring up to 6 mm in the
distal body/tail.

Spleen:  Within normal limits in size and appearance.

Adrenals/Urinary Tract: Adrenal glands are normal. A few tiny cysts
in the kidneys. Kidneys appear otherwise normal.

Stomach/Bowel: Visualized bowel is unremarkable.

Vascular/Lymphatic: No pathologically enlarged lymph nodes
identified. No abdominal aortic aneurysm demonstrated.

Other:  No ascites

Musculoskeletal: No suspicious bone lesions identified.
IMPRESSION: 1. Fatty infiltration and mild atrophy in the head and body of the
pancreas. Several subcentimeter cystic foci throughout the pancreas
most likely represent prominent ductal side branches or possibly
small cysts/pseudocysts. No pancreatic ductal dilatation. Consider
follow-up MRI in 12 months.
2. Hepatomegaly with hepatic steatosis.

## 2021-07-21 MED ORDER — GADOBUTROL 1 MMOL/ML IV SOLN
8.0000 mL | Freq: Once | INTRAVENOUS | Status: AC | PRN
  Administered 2021-07-21: 10:00:00 8 mL via INTRAVENOUS

## 2021-07-22 ENCOUNTER — Other Ambulatory Visit (INDEPENDENT_AMBULATORY_CARE_PROVIDER_SITE_OTHER): Payer: Medicare Other

## 2021-07-22 DIAGNOSIS — R933 Abnormal findings on diagnostic imaging of other parts of digestive tract: Secondary | ICD-10-CM | POA: Diagnosis not present

## 2021-07-22 DIAGNOSIS — K859 Acute pancreatitis without necrosis or infection, unspecified: Secondary | ICD-10-CM | POA: Diagnosis not present

## 2021-07-22 DIAGNOSIS — K760 Fatty (change of) liver, not elsewhere classified: Secondary | ICD-10-CM

## 2021-07-22 LAB — HEPATIC FUNCTION PANEL
ALT: 42 U/L — ABNORMAL HIGH (ref 0–35)
AST: 23 U/L (ref 0–37)
Albumin: 4.5 g/dL (ref 3.5–5.2)
Alkaline Phosphatase: 72 U/L (ref 39–117)
Bilirubin, Direct: 0.1 mg/dL (ref 0.0–0.3)
Total Bilirubin: 0.4 mg/dL (ref 0.2–1.2)
Total Protein: 7.1 g/dL (ref 6.0–8.3)

## 2021-07-26 ENCOUNTER — Other Ambulatory Visit: Payer: Self-pay

## 2021-07-26 DIAGNOSIS — R748 Abnormal levels of other serum enzymes: Secondary | ICD-10-CM

## 2021-07-26 DIAGNOSIS — K760 Fatty (change of) liver, not elsewhere classified: Secondary | ICD-10-CM

## 2021-08-02 MED ORDER — TRULANCE 3 MG PO TABS: 3.0000 mg | ORAL_TABLET | Freq: Every day | ORAL | 0 refills | Status: DC

## 2021-08-02 NOTE — Telephone Encounter (Signed)
I spoke with Stephanie Ewing and explained a miralax bowel purge: take 4 Dulcolax '5mg'$  tablets, wait one hour, then take 6-8 cap full's of Miralax mixed in the amount of water for that many doses. Drink over 2-3 hours and then hopefully within 6 hours she will get results.  ? ?She said she is having no discomfort currently even thou she hasn't had a BM in over a week. She has no urge to go. She will come tomorrow and get the samples of Trulance '3mg'$  to try and let us know how that goes. ?

## 2021-08-02 NOTE — Telephone Encounter (Signed)
Stephanie Ewing if she has not been able to have a bowel movement in a week, may want to use a Miralax bowel prep to purge her bowel and clear her out, and then start another regimen. We can give her some samples of Trulance in the office - 1 tab daily, if she wants to try that otherwise, and if it works better than the [place], can try that. Otherwise can also combine that with Miralax a few doses daily. Thanks ?

## 2021-08-02 NOTE — Addendum Note (Signed)
Addended by: [place], Erlean Mealor E on: 08/02/2021 05:24 PM ? ? Modules accepted: Orders ? ?

## 2021-08-02 NOTE — Telephone Encounter (Signed)
Patient called and stated that the Linzess is not working and she has not used the bathroom In over a week. Stated that she has tried different things and nothing is seeming to work. Patient is seeking advice if there is anything else she can take to help. Please advise.  ?

## 2021-08-10 NOTE — Telephone Encounter (Signed)
Patient called states the Trulance medication is not working for her. Also scheduled a colonoscopy for 5/24-23 please advise and send pre-op instructions. ?

## 2021-08-11 NOTE — Telephone Encounter (Signed)
Patient returned call and I let her know that she could try samples of Motegrity , try another Miralax purge in the interim, and continue high does of Miralax. Patient stated that she would come by tomorrow to pick up samples of Motegrity.  ?

## 2021-08-11 NOTE — Telephone Encounter (Signed)
Called patient to clarify how much Oxycodone she is taking. Patient stated that she does not take Oxycodone more it was given to her after a procedure and she has not taken it since. Patient also stated she has not had a bowel movent since bowel purge she completed last week. ?

## 2021-08-11 NOTE — Telephone Encounter (Signed)
Has she tried combining Miralax BID with Linzess or Trulance? Combination of therapies is next step here - Miralax + either Linzess or Trulance, whichever she has at home. Thanks ?

## 2021-08-11 NOTE — Telephone Encounter (Signed)
Left VM for patient to return call

## 2021-08-11 NOTE — Telephone Encounter (Signed)
Called patient and let her know to try Miralax twice daily with Linzess or Trulance. Patient stated that she has already tried Miralax twice daily with both Linzess and Trulance and it did not work. Please advise. ?

## 2021-08-11 NOTE — Telephone Encounter (Signed)
Okay thanks.  ?Can you clarify how much narcotic she is taking - oxycodone, and if that is a new medication for her or a chronic medication. If she takes narcotics daily we can consider another class of medication such as Movantik. If you can let me know that we can discuss her options. Thanks ?

## 2021-08-11 NOTE — Telephone Encounter (Signed)
Okay thanks [employer]. ?If she has failed Trulance and Linzess, we may have some samples of Motegrity at the office we can give to her and see if that will help. She can try another Miralax purge in the interim, and continue high dose miralax in combination with the Motegrity. Thanks ?

## 2021-08-12 NOTE — Telephone Encounter (Signed)
Patient came to pick up samples of Motegrity 2 mg #14 given. She will call to update after the trial of samples.  ?

## 2021-08-24 ENCOUNTER — Other Ambulatory Visit: Payer: Self-pay | Admitting: Internal Medicine

## 2021-08-24 ENCOUNTER — Ambulatory Visit
Admission: RE | Admit: 2021-08-24 | Discharge: 2021-08-24 | Disposition: A | Discharge status: Home / Self Care | Payer: Medicare Other | Source: Ambulatory Visit | Attending: Internal Medicine | Admitting: Internal Medicine

## 2021-08-24 DIAGNOSIS — Z1231 Encounter for screening mammogram for malignant neoplasm of breast: Secondary | ICD-10-CM

## 2021-08-24 IMAGING — MG MM DIGITAL SCREENING BILAT W/ TOMO AND CAD
6 of 10 series · 6 of 30 positions shown · non-contrast
Comparison: Previous exam(s).

CLINICAL DATA: Screening.

EXAM:
DIGITAL SCREENING BILATERAL MAMMOGRAM WITH TOMOSYNTHESIS AND CAD
TECHNIQUE: Bilateral screening digital craniocaudal and mediolateral oblique
mammograms were obtained. Bilateral screening digital breast
tomosynthesis was performed. The images were evaluated with
computer-aided detection.

[L CC synth-2D]
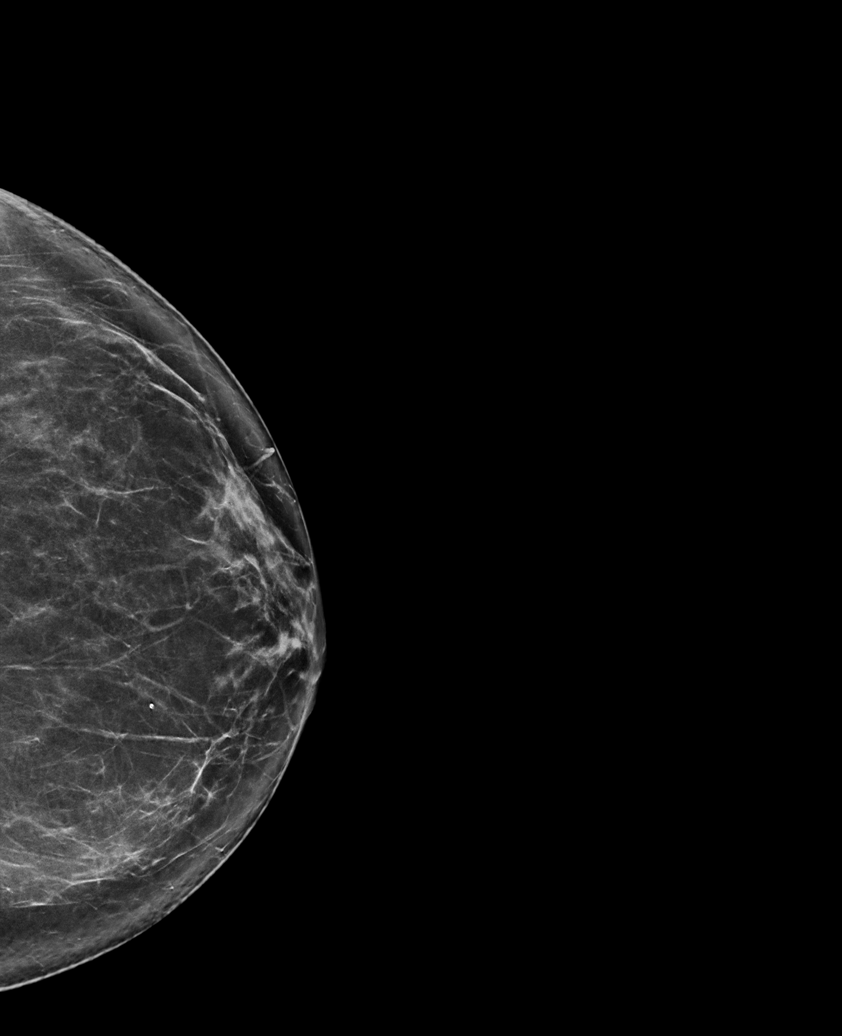

[L MLO synth-2D (1 of 2)]
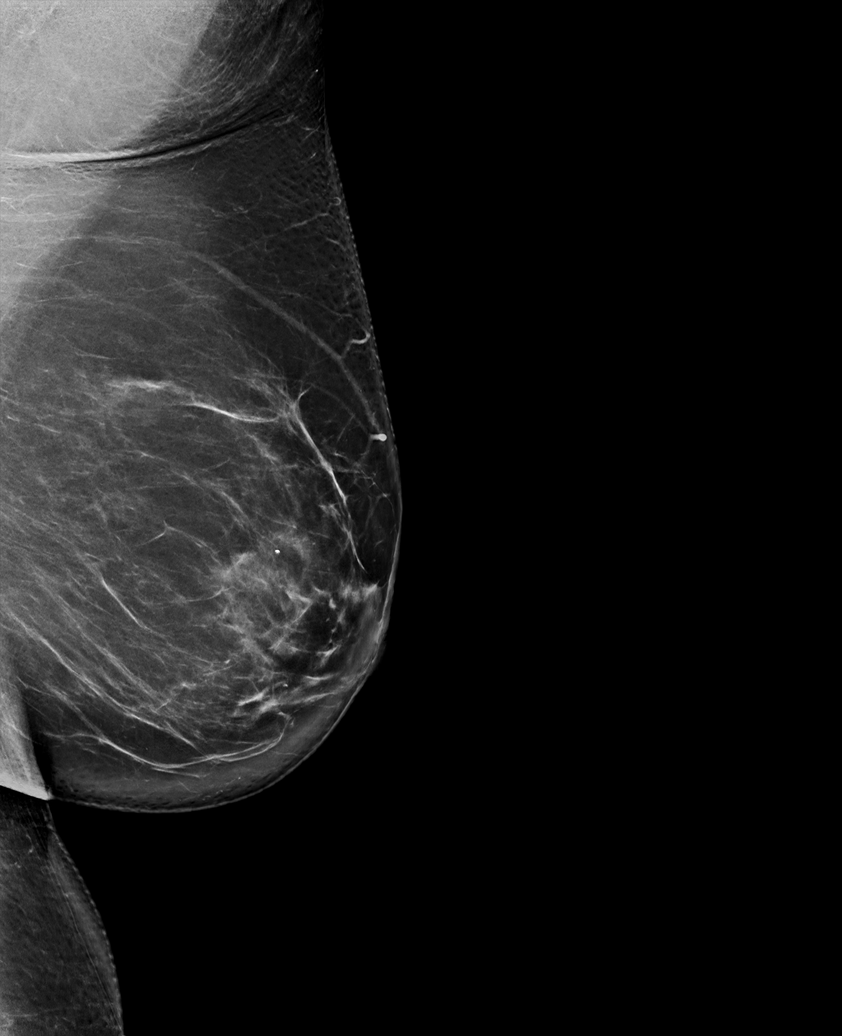

[R MLO synth-2D]
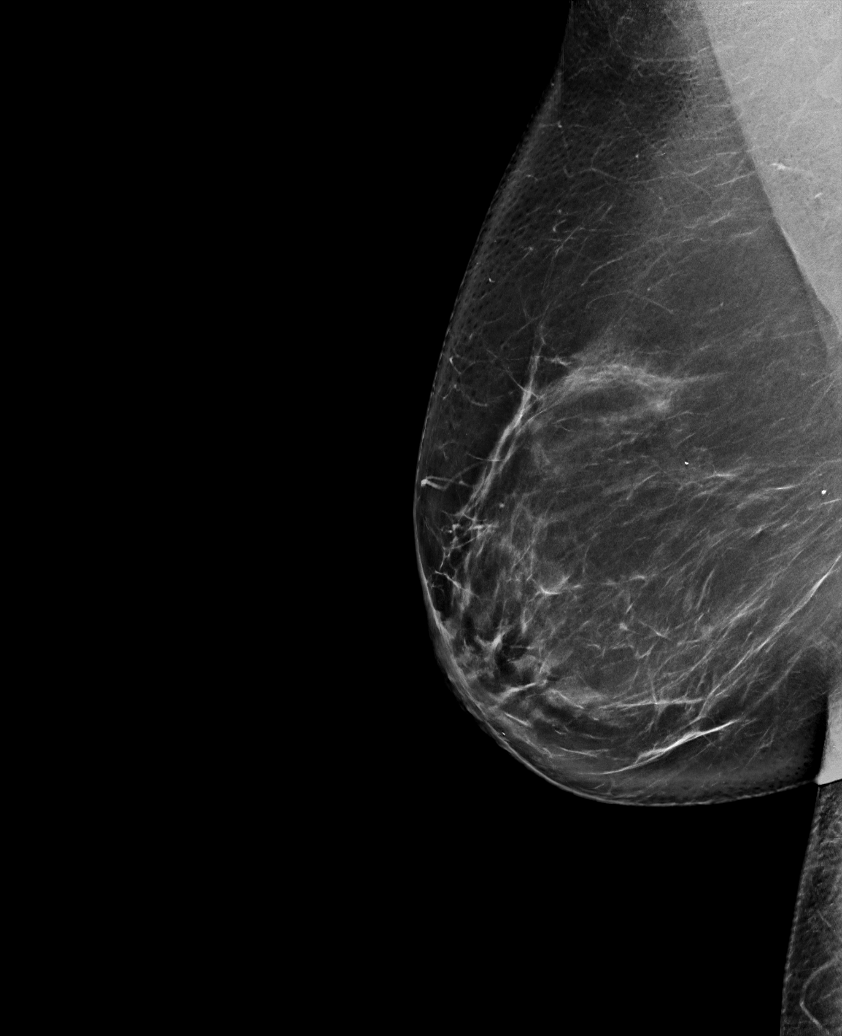

[R CC synth-2D]
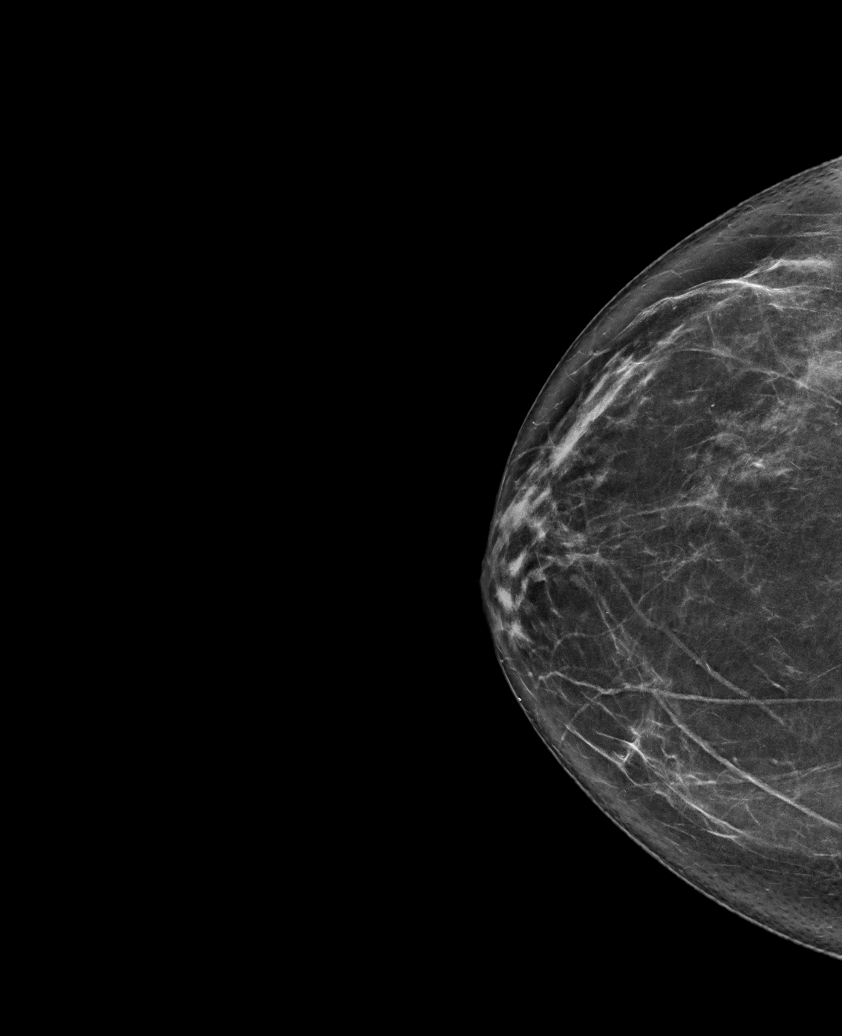

[L MLO synth-2D (2 of 2)]
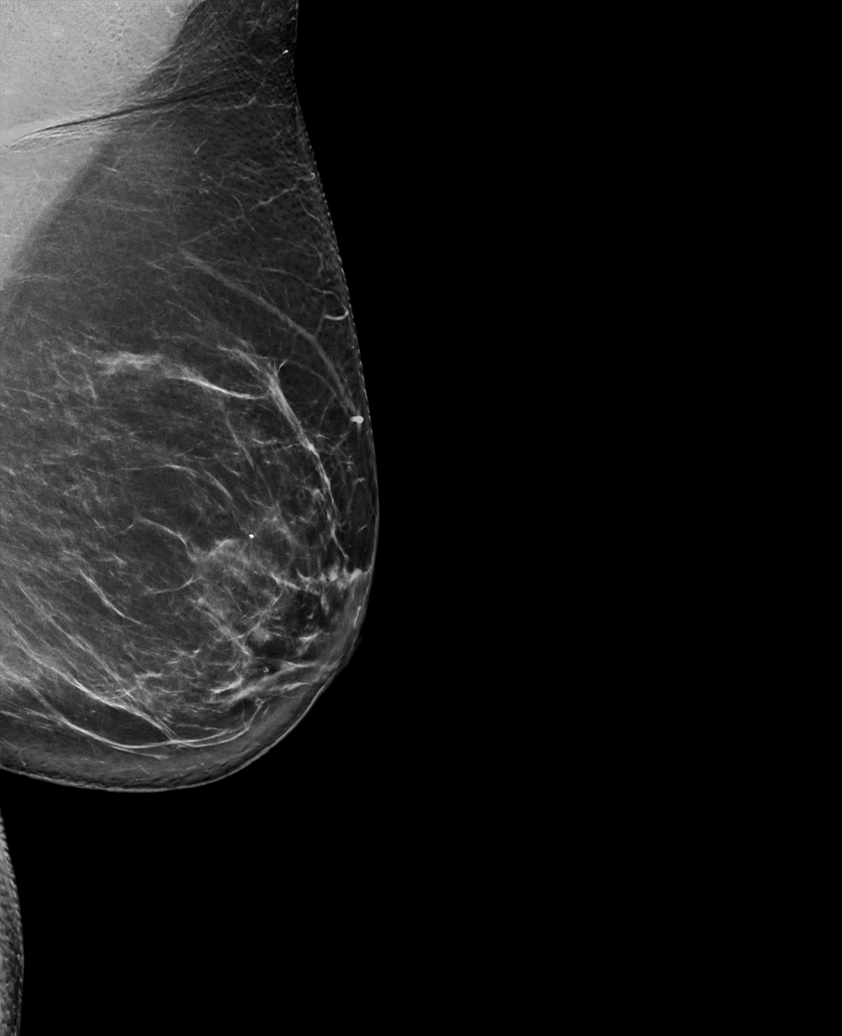

[R MLO tomo · tomo slice 49/97.0]
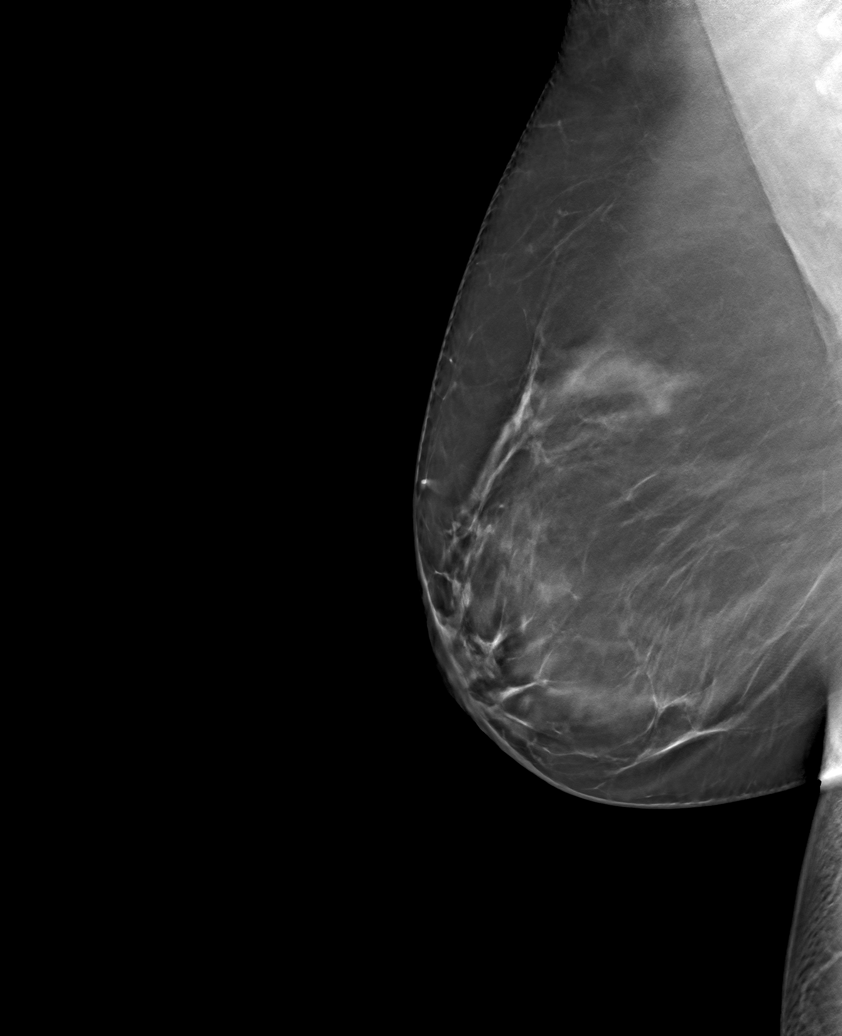

[6 of 30 positions shown; findings below may reference images not displayed]

ACR Breast Density Category b: There are scattered areas of
fibroglandular density.
FINDINGS: In the right breast, a possible asymmetry warrants further
evaluation. In the left breast, no findings suspicious for
malignancy.
IMPRESSION: Further evaluation is suggested for possible asymmetry in the right
breast.

RECOMMENDATION:
Diagnostic mammogram and possibly ultrasound of the right breast.
(Code:[CB])

The patient will be contacted regarding the findings, and additional
imaging will be scheduled.

BI-RADS CATEGORY  0: Incomplete. Need additional imaging evaluation
and/or prior mammograms for comparison.

## 2021-08-26 ENCOUNTER — Other Ambulatory Visit: Payer: Self-pay | Admitting: Internal Medicine

## 2021-08-26 DIAGNOSIS — R928 Other abnormal and inconclusive findings on diagnostic imaging of breast: Secondary | ICD-10-CM

## 2021-08-29 ENCOUNTER — Encounter: Payer: Self-pay | Admitting: Gastroenterology

## 2021-08-29 MED ORDER — IBSRELA 50 MG PO TABS: 50.0000 mg | ORAL_TABLET | Freq: Two times a day (BID) | ORAL | 0 refills | Status: DC

## 2021-08-29 NOTE — Telephone Encounter (Signed)
#  18 IBSrela tablets have been placed at the 2nd floor receptionist desk for patient. ?Pt has been notified of recommendations via [place].  ?

## 2021-09-12 ENCOUNTER — Encounter: Payer: Self-pay | Admitting: Gastroenterology

## 2021-09-12 ENCOUNTER — Ambulatory Visit: Payer: Medicare Other

## 2021-09-12 ENCOUNTER — Other Ambulatory Visit: Payer: Self-pay

## 2021-09-12 ENCOUNTER — Ambulatory Visit
Admission: RE | Admit: 2021-09-12 | Discharge: 2021-09-12 | Disposition: A | Discharge status: Home / Self Care | Payer: Medicare Other | Source: Ambulatory Visit | Attending: Internal Medicine | Admitting: Internal Medicine

## 2021-09-12 DIAGNOSIS — R928 Other abnormal and inconclusive findings on diagnostic imaging of breast: Secondary | ICD-10-CM

## 2021-09-12 IMAGING — MG MM DIGITAL DIAGNOSTIC UNILAT*R* W/ TOMO W/ CAD
4 series · 4 of 12 positions shown · non-contrast
Comparison: Previous exam(s).

CLINICAL DATA: 65-year-old female presenting as a recall from
screening for possible right breast asymmetry.

EXAM:
DIGITAL DIAGNOSTIC UNILATERAL RIGHT MAMMOGRAM WITH TOMOSYNTHESIS AND
CAD
TECHNIQUE: Right digital diagnostic mammography and breast tomosynthesis was
performed. The images were evaluated with computer-aided detection.

[R ML synth-2D]
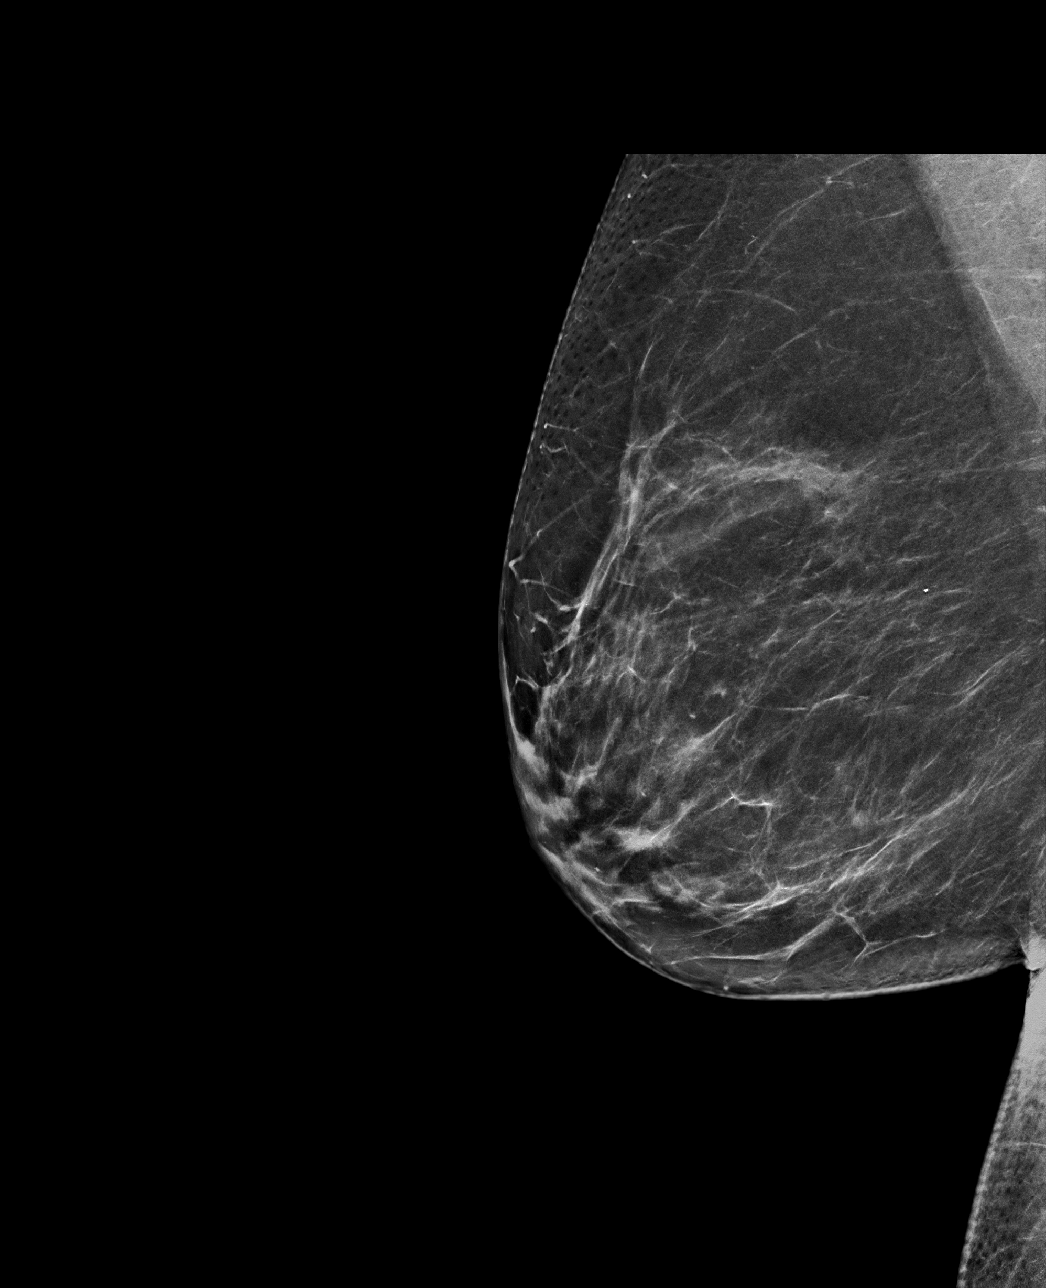

[R MLO synth-2D]
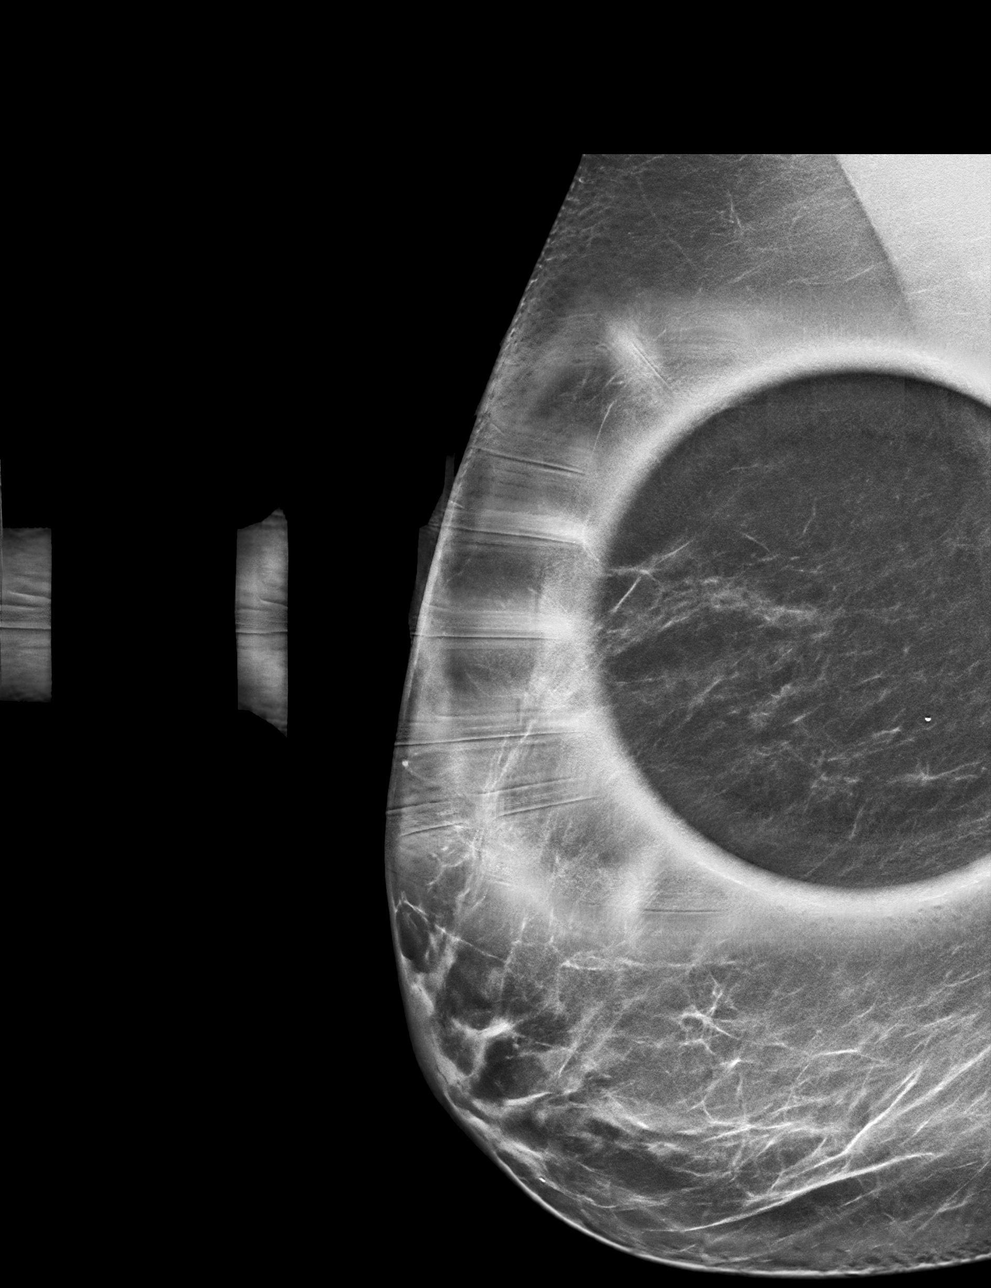

[R MLO tomo · tomo slice 36/71.0]
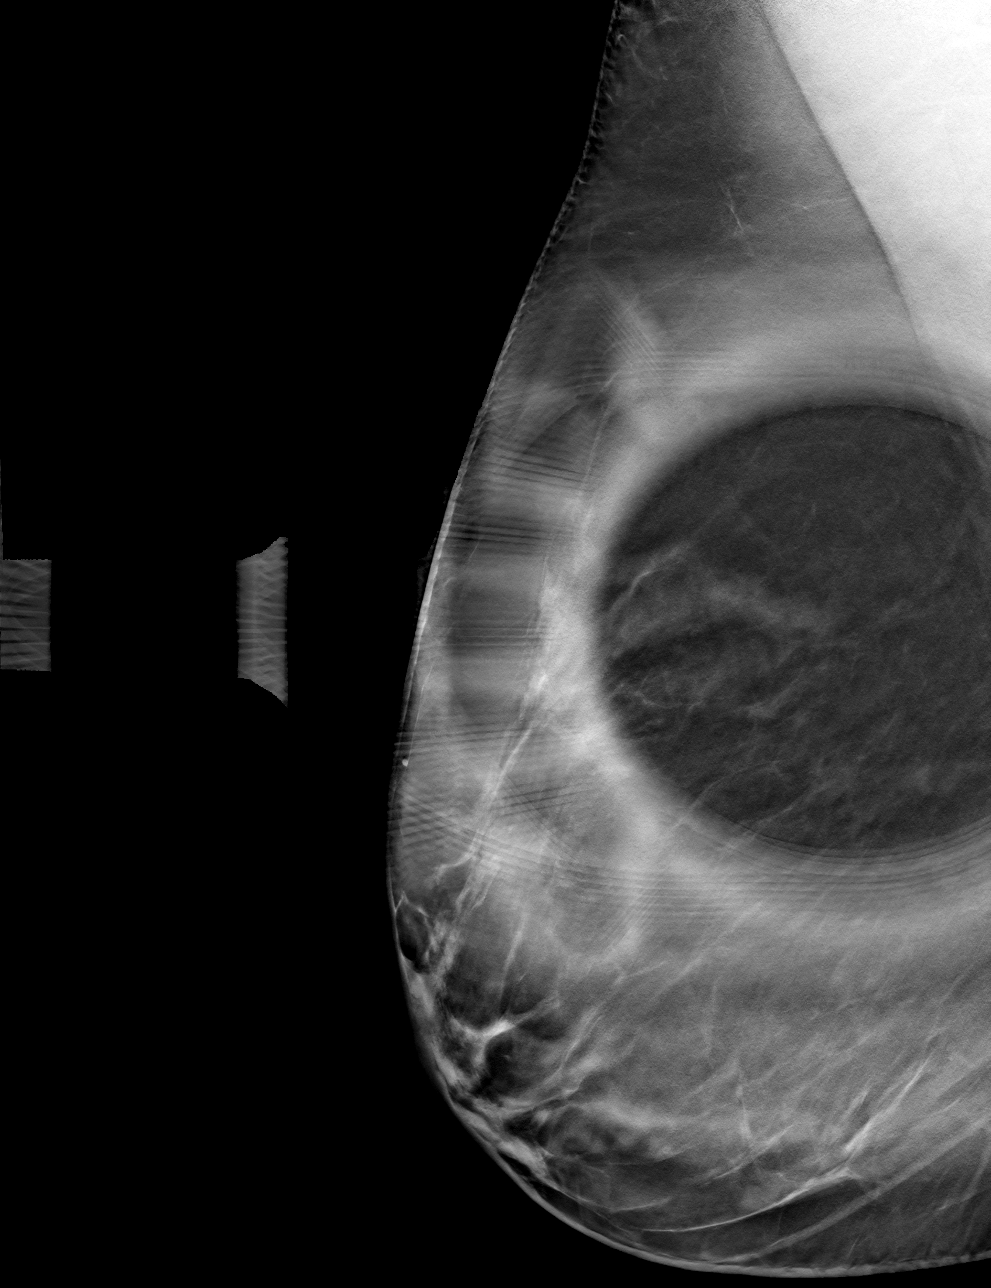

[R ML tomo · tomo slice 41/81.0]
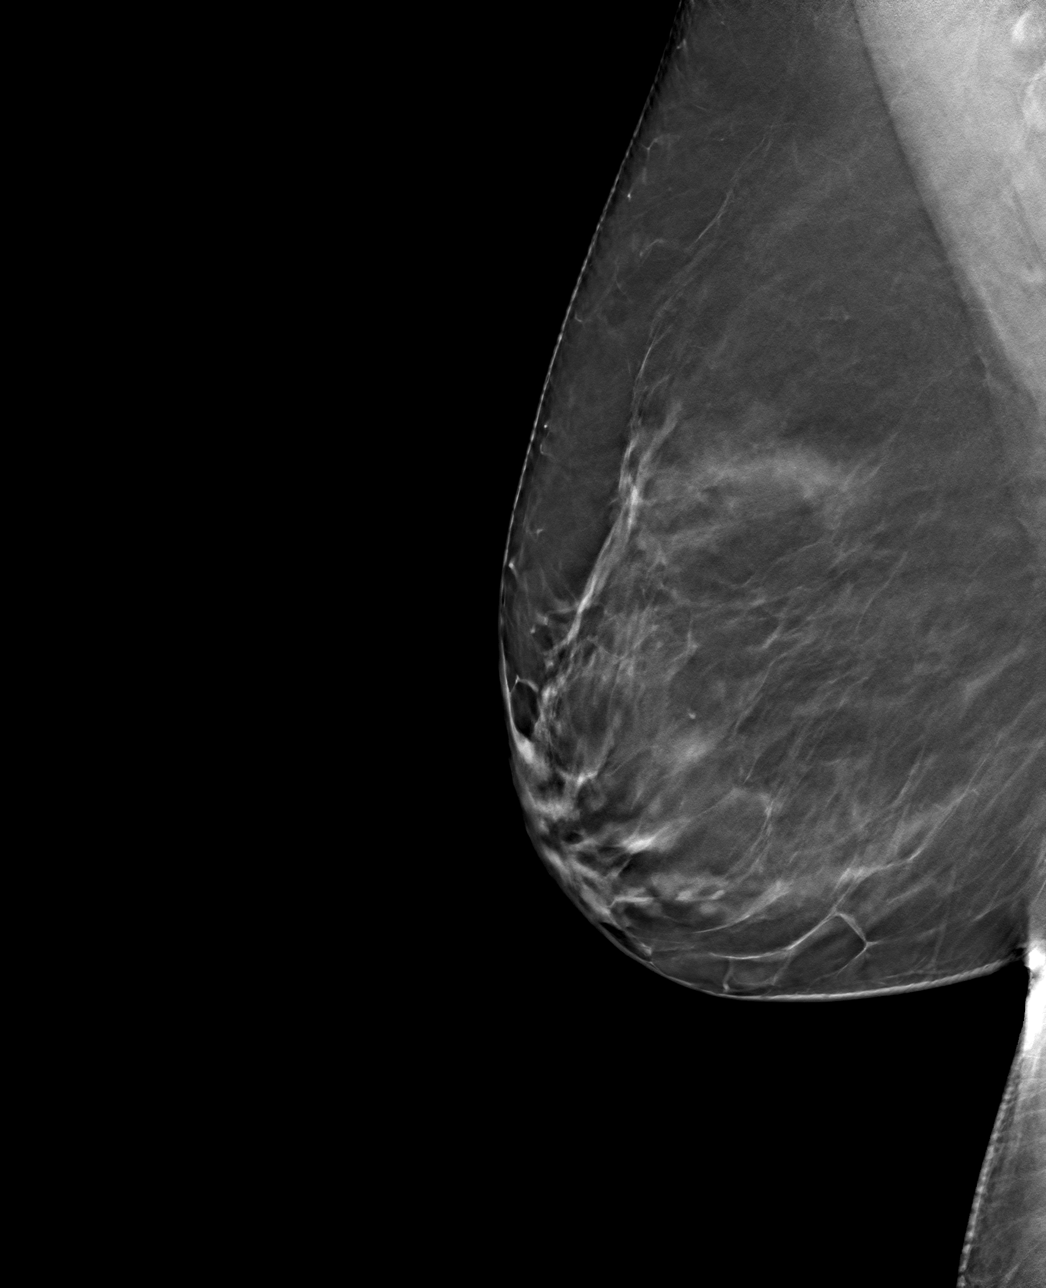

[4 of 12 positions shown; findings below may reference images not displayed]

ACR Breast Density Category b: There are scattered areas of
fibroglandular density.
FINDINGS: Spot compression tomosynthesis MLO and full field mL tomosynthesis
views of the right breast were performed for a questioned asymmetry
seen only on MLO view in the superior right breast. On the
additional imaging the tissue in this area disperses without
persistent asymmetry, mass, or distortion. The finding on screening
mammogram is most consistent with normal overlapping fibroglandular
tissue.
IMPRESSION: No mammographic evidence of malignancy in the right breast.

RECOMMENDATION:
Screening mammogram in one year.(Code:[CJ])

I have discussed the findings and recommendations with the patient.
If applicable, a reminder letter will be sent to the patient
regarding the next appointment.

BI-RADS CATEGORY  1: Negative.

## 2021-09-12 MED ORDER — IBSRELA 50 MG PO TABS: 50.0000 mg | ORAL_TABLET | Freq: Two times a day (BID) | ORAL | 1 refills | Status: DC

## 2021-09-13 ENCOUNTER — Telehealth: Payer: Self-pay

## 2021-09-13 NOTE — Telephone Encounter (Signed)
-----   Message from Yevette Edwards, RN sent at 07/26/2021  9:04 AM EDT ----- ?Regarding: Labs ?Hepatic function panel, order is in epic. ? ?

## 2021-09-13 NOTE — Telephone Encounter (Signed)
MyChart message sent to patient with lab reminder.  

## 2021-09-13 NOTE — Telephone Encounter (Signed)
Patient has reviewed the MyChart message. ?Last read by [name] at  9:55 AM on 09/13/2021. ?

## 2021-09-14 ENCOUNTER — Telehealth: Payer: Self-pay | Admitting: Gastroenterology

## 2021-09-14 ENCOUNTER — Other Ambulatory Visit: Payer: Self-pay

## 2021-09-14 DIAGNOSIS — Z86010 Personal history of colonic polyps: Secondary | ICD-10-CM

## 2021-09-14 NOTE — Telephone Encounter (Signed)
Called patient.  LM that I will send instructions to her MyChart account for her colonoscopy on 5-24 with Dr. Havery Moros. Instructions will be mailed as well. Patient encouraged to read them carefully and call back with any questions.  ?

## 2021-09-14 NOTE — Telephone Encounter (Signed)
Patient called and wanted Dr. Havery Moros to know that she has no urge to go at all. This has been ongoing for the last 6 to 7 weeks. She waits for her stomach to start making noise and she knows to go sit on the toilet. She also reports waking up to having had gone to the bathroom in her bed. She reports morning nausea. Orpah Cobb has worked the best but it is cost prohibitive.  She indicates she has never tried Amitiza and it would be more cost effective for her. Please advise   ? ? ? ?

## 2021-09-14 NOTE — Telephone Encounter (Signed)
Jan she needs a follow up clinic in the office if not already scheduled.  ?In the interim can try Amitiza but it is not as strong as other regimens, so not sure if this would work. Can try 66mg PO BID for 1 month trial. Thanks ? ? ?

## 2021-09-14 NOTE — Telephone Encounter (Signed)
Inbound call from patient returning your call. In addition to prep instructions patient would like a call to discuss other prep. ?

## 2021-09-14 NOTE — Telephone Encounter (Signed)
Patient has colonoscopy scheduled for 5/24 with Dr. Havery Moros.  She was looking for the instructions in My Chart, but they were not there.  She does not want to do the pill prep as it did not seem to work well for her and asked if she could do the Dulcolax/Miralax prep. ? ?Please call patient and advise. ? ?Thank you. ?

## 2021-09-15 ENCOUNTER — Other Ambulatory Visit (INDEPENDENT_AMBULATORY_CARE_PROVIDER_SITE_OTHER): Payer: Medicare Other

## 2021-09-15 ENCOUNTER — Telehealth: Payer: Self-pay

## 2021-09-15 DIAGNOSIS — R748 Abnormal levels of other serum enzymes: Secondary | ICD-10-CM | POA: Diagnosis not present

## 2021-09-15 DIAGNOSIS — K760 Fatty (change of) liver, not elsewhere classified: Secondary | ICD-10-CM

## 2021-09-15 LAB — HEPATIC FUNCTION PANEL
ALT: 38 U/L — ABNORMAL HIGH (ref 0–35)
AST: 19 U/L (ref 0–37)
Albumin: 4.8 g/dL (ref 3.5–5.2)
Alkaline Phosphatase: 68 U/L (ref 39–117)
Bilirubin, Direct: 0.1 mg/dL (ref 0.0–0.3)
Total Bilirubin: 0.4 mg/dL (ref 0.2–1.2)
Total Protein: 7.4 g/dL (ref 6.0–8.3)

## 2021-09-15 MED ORDER — LUBIPROSTONE 24 MCG PO CAPS: 24.0000 ug | ORAL_CAPSULE | Freq: Two times a day (BID) | ORAL | 0 refills | Status: DC

## 2021-09-15 NOTE — Telephone Encounter (Signed)
PA for Amitiza 24 mcg BID completed and faxed to [facility] at [phone].  Phone: [phone]. Patient ID: [id number].  ?

## 2021-09-15 NOTE — Addendum Note (Signed)
Addended by: Roetta Sessions on: 09/15/2021 09:19 AM ? ? Modules accepted: Orders ? ?

## 2021-09-15 NOTE — Telephone Encounter (Signed)
Script sent to pharmacy.  Patient scheduled for 5-26 at 3:40 pm. See MyChart message to pt ?

## 2021-09-16 ENCOUNTER — Ambulatory Visit: Payer: Medicare Other | Admitting: Cardiology

## 2021-09-19 ENCOUNTER — Other Ambulatory Visit: Payer: Self-pay

## 2021-09-19 DIAGNOSIS — K760 Fatty (change of) liver, not elsewhere classified: Secondary | ICD-10-CM

## 2021-09-19 DIAGNOSIS — R748 Abnormal levels of other serum enzymes: Secondary | ICD-10-CM

## 2021-09-19 NOTE — Telephone Encounter (Signed)
PA approved  for Amitiza 24 mcg  BID 09-15-21 thru 10-05-22. Case #[id number]. Called pharmacy.  Cost to patient $41.33 for a 30 day supply. They are ordering it for her and will notify her of approval and cost ?

## 2021-09-28 ENCOUNTER — Other Ambulatory Visit: Payer: Medicare Other

## 2021-09-28 DIAGNOSIS — R748 Abnormal levels of other serum enzymes: Secondary | ICD-10-CM

## 2021-09-28 DIAGNOSIS — K760 Fatty (change of) liver, not elsewhere classified: Secondary | ICD-10-CM

## 2021-09-29 LAB — HEPATITIS B SURFACE ANTIBODY,QUALITATIVE: Hep B S Ab: NONREACTIVE

## 2021-09-29 LAB — HEPATITIS C ANTIBODY
Hepatitis C Ab: NONREACTIVE
SIGNAL TO CUT-OFF: 0.05 (ref <1.00)

## 2021-09-29 LAB — HEPATITIS A ANTIBODY, TOTAL: Hepatitis A AB,Total: NONREACTIVE

## 2021-09-29 LAB — HEPATITIS B SURFACE ANTIGEN: Hepatitis B Surface Ag: NONREACTIVE

## 2021-10-05 ENCOUNTER — Ambulatory Visit (AMBULATORY_SURGERY_CENTER): Payer: Medicare Other | Admitting: Gastroenterology

## 2021-10-05 ENCOUNTER — Encounter: Payer: Self-pay | Admitting: Gastroenterology

## 2021-10-05 VITALS — BP 145/89 | HR 62 | Temp 97.5°F | Resp 9 | Ht 67.0 in | Wt 181.0 lb

## 2021-10-05 DIAGNOSIS — D125 Benign neoplasm of sigmoid colon: Secondary | ICD-10-CM | POA: Diagnosis not present

## 2021-10-05 DIAGNOSIS — D120 Benign neoplasm of cecum: Secondary | ICD-10-CM | POA: Diagnosis not present

## 2021-10-05 DIAGNOSIS — Z86010 Personal history of colonic polyps: Secondary | ICD-10-CM | POA: Diagnosis not present

## 2021-10-05 DIAGNOSIS — D123 Benign neoplasm of transverse colon: Secondary | ICD-10-CM | POA: Diagnosis not present

## 2021-10-05 MED ORDER — SODIUM CHLORIDE 0.9 % IV SOLN: 500.0000 mL | Freq: Once | INTRAVENOUS | Status: DC

## 2021-10-05 NOTE — Progress Notes (Signed)
[facility] History and Physical   Primary Care Physician:  Monico Blitz, MD   Reason for Procedure:   History of colon polyps  Plan:    colonoscopy     HPI: Stephanie Ewing is a 66 y.o. female  here for colonoscopy surveillance for history of polyps. She has chronic constipation and has failed numerous regimens - IBSRela tends to have worked the best for her.  No family history of colon cancer known. Otherwise feels well without any cardiopulmonary symptoms. Have discussed risks / benefits and she wants to proceed.   Past Medical History:  Diagnosis Date   Afib ([place])    Arthritis    psoratic   Asthma    "weak"    Constipation    Depression    Dysrhythmia    AFIb   GERD (gastroesophageal reflux disease)    History of hiatal hernia    Hyperaldosteronism (HCC)    Hypertension    Hypokalemia    Hypothyroidism    Insomnia    Migraine    ocassional   Nicotine dependence, cigarettes, uncomplicated    Pancreatitis    PONV (postoperative nausea and vomiting)    Pre-diabetes    Thyroid disease    hypothyroid    Past Surgical History:  Procedure Laterality Date   ANTERIOR CERVICAL DECOMPRESSION/DISCECTOMY FUSION 4 LEVELS N/A 07/20/2020   Procedure: ANTERIOR CERVICAL DECOMPRESSION FUSION CERVICAL FOUR-CERVICAL FIVE, CERVICAL FIVE-CERVICAL SIX, CERVICAL SIX-CERVICAL SEVEN;  Surgeon: Consuella Lose, MD;  Location: [facility];  Service: Neurosurgery;  Laterality: N/A;   BIOPSY  06/28/2020   Procedure: BIOPSY;  Surgeon: Rush Landmark Telford Nab., MD;  Location: [facility];  Service: Gastroenterology;;   CHOLECYSTECTOMY N/A 12/30/2020   Procedure: LAPAROSCOPIC CHOLECYSTECTOMY;  Surgeon: Ileana Roup, MD;  Location: [facility];  Service: General;  Laterality: N/A;   elbow surgery Right    ESOPHAGOGASTRODUODENOSCOPY (EGD) WITH PROPOFOL N/A 06/28/2020   Procedure: ESOPHAGOGASTRODUODENOSCOPY (EGD) WITH PROPOFOL;  Surgeon: Irving Copas., MD;  Location: [facility];  Service: Gastroenterology;  Laterality: N/A;   ESOPHAGOGASTRODUODENOSCOPY (EGD) WITH PROPOFOL N/A 08/05/2020   Procedure: ESOPHAGOGASTRODUODENOSCOPY (EGD) WITH PROPOFOL;  Surgeon: Rush Landmark Telford Nab., MD;  Location: [facility];  Service: Gastroenterology;  Laterality: N/A;   EUS N/A 06/28/2020   Procedure: UPPER ENDOSCOPIC ULTRASOUND (EUS) LINEAR;  Surgeon: Irving Copas., MD;  Location: [facility];  Service: Gastroenterology;  Laterality: N/A;   EUS N/A 08/05/2020   Procedure: UPPER ENDOSCOPIC ULTRASOUND (EUS) RADIAL;  Surgeon: Irving Copas., MD;  Location: [facility];  Service: Gastroenterology;  Laterality: N/A;   FINE NEEDLE ASPIRATION  06/28/2020   Procedure: FINE NEEDLE ASPIRATION (FNA) LINEAR;  Surgeon: Irving Copas., MD;  Location: [facility];  Service: Gastroenterology;;   FINE NEEDLE ASPIRATION  08/05/2020   Procedure: FINE NEEDLE ASPIRATION;  Surgeon: Irving Copas., MD;  Location: [facility];  Service: Gastroenterology;;   POLYPECTOMY  06/28/2020   Procedure: POLYPECTOMY;  Surgeon: Irving Copas., MD;  Location: [facility];  Service: Gastroenterology;;   SHOULDER SURGERY Bilateral    inpingement release   TOTAL ABDOMINAL HYSTERECTOMY  2015   ULNAR NERVE REPAIR Right     Prior to Admission medications   Medication Sig Start Date End Date Taking? Authorizing Provider  aspirin EC 81 MG tablet Take 1 tablet (81 mg total) by mouth at bedtime. 07/27/20  Yes Costella, Vista Mink, PA-C  Cholecalciferol (VITAMIN D) 50 MCG (2000 UT) CAPS Take 2,000 Units by mouth in the morning.   Yes  [provider]  diphenhydrAMINE (BENADRYL) 25 MG tablet Take 100 mg by mouth at bedtime.   Yes [provider]  fexofenadine (ALLEGRA) 180 MG tablet Take 180 mg by mouth in the morning.   Yes [provider]  lubiprostone (AMITIZA) 24 MCG capsule Take 1 capsule (24 mcg total) by mouth 2 (two) times daily with  a meal. 09/15/21  Yes Sommer Spickard, [name], MD  omeprazole (PRILOSEC) 40 MG capsule Take 1 capsule (40 mg total) by mouth 2 (two) times daily as needed. 07/05/21  Yes Miniya Miguez, [name], MD  polyethylene glycol (MIRALAX) 17 g packet Take 17 g by mouth daily as needed. Patient taking differently: Take 17 g by mouth daily. 06/10/20  Yes Arieliz Latino, [name], MD  RESTASIS 0.05 % ophthalmic emulsion Place 1 drop into both eyes 2 (two) times daily. 07/29/19  Yes [provider]  spironolactone (ALDACTONE) 50 MG tablet 1 tablet 09/09/21  Yes [provider]  SYNTHROID 125 MCG tablet Take 125 mcg by mouth daily before breakfast. 05/14/20  Yes [provider]  traZODone (DESYREL) 100 MG tablet Take 100 mg by mouth at bedtime.   Yes [provider]  traZODone (DESYREL) 150 MG tablet Take 150 mg by mouth at bedtime. 12/28/17  Yes [provider]  acetaminophen (TYLENOL) 325 MG tablet Take 2 tablets (650 mg total) by mouth every 6 (six) hours. Patient taking differently: Take 650 mg by mouth every 6 (six) hours as needed for mild pain. 04/15/20   [name], MD  albuterol (VENTOLIN HFA) 108 (90 Base) MCG/ACT inhaler Inhale into the lungs every 6 (six) hours as needed for wheezing or shortness of breath.    [provider]  azelastine (ASTELIN) 0.1 % nasal spray Place 1 spray into both nostrils at bedtime. Use in each nostril as directed    [provider]  b complex vitamins capsule Take 1 capsule by mouth daily.    [provider]  Cannabidiol POWD Apply 1 application topically as needed (pain). CBD Oil/CBD Cream    [provider]  diltiazem (CARDIZEM) 30 MG tablet Take 1 tablet every 4 hours AS NEEDED for heart rate >100 as long as top blood pressure >100. Patient taking differently: Take 30 mg by mouth as directed. Take 1 tablet ('30mg'$ )  every 4 hours AS NEEDED for heart rate >100 as long as top blood pressure >100. 09/19/19    Sherran Needs, NP  fluticasone-salmeterol (ADVAIR HFA) 230-21 MCG/ACT inhaler Inhale 2 puffs into the lungs 2 (two) times daily.    [provider]  gabapentin (NEURONTIN) 300 MG capsule Take 300 mg by mouth 3 (three) times daily.    [provider]  Golimumab ([place] ARIA IV) Inject 1 Dose into the vein as directed. Every 2 months    [provider]  methocarbamol (ROBAXIN-750) 750 MG tablet Take 1 tablet (750 mg total) by mouth 3 (three) times daily as needed for muscle spasms. 07/21/20   Costella, Vista Mink, PA-C  oxyCODONE-acetaminophen (PERCOCET) 7.5-325 MG tablet Take 1 tablet by mouth every 4 (four) hours as needed for severe pain. 07/21/20   Costella, Vista Mink, PA-C  promethazine (PHENERGAN) 12.5 MG tablet Take 12.5 mg by mouth 3 (three) times daily as needed for nausea/vomiting. 05/25/20   [provider]  sodium chloride (OCEAN) 0.65 % SOLN nasal spray Place 1 spray into both nostrils as needed for congestion.    [provider]  spironolactone (ALDACTONE) 100 MG tablet  Take 100 mg by mouth in the morning. Patient not taking: Reported on 10/05/2021    [provider]  Tenapanor HCl (IBSRELA) 50 MG TABS Take 50 mg by mouth 2 (two) times daily before a meal. 09/12/21   Amiliana Foutz, [name], MD    Current Outpatient Medications  Medication Sig Dispense Refill   aspirin EC 81 MG tablet Take 1 tablet (81 mg total) by mouth at bedtime. 30 tablet 11   Cholecalciferol (VITAMIN D) 50 MCG (2000 UT) CAPS Take 2,000 Units by mouth in the morning.     diphenhydrAMINE (BENADRYL) 25 MG tablet Take 100 mg by mouth at bedtime.     fexofenadine (ALLEGRA) 180 MG tablet Take 180 mg by mouth in the morning.     lubiprostone (AMITIZA) 24 MCG capsule Take 1 capsule (24 mcg total) by mouth 2 (two) times daily with a meal. 60 capsule 0   omeprazole (PRILOSEC) 40 MG capsule Take 1 capsule (40 mg total) by mouth 2 (two) times daily as needed. 180 capsule 3    polyethylene glycol (MIRALAX) 17 g packet Take 17 g by mouth daily as needed. (Patient taking differently: Take 17 g by mouth daily.) 14 each 0   RESTASIS 0.05 % ophthalmic emulsion Place 1 drop into both eyes 2 (two) times daily.     spironolactone (ALDACTONE) 50 MG tablet 1 tablet     SYNTHROID 125 MCG tablet Take 125 mcg by mouth daily before breakfast.     traZODone (DESYREL) 100 MG tablet Take 100 mg by mouth at bedtime.     traZODone (DESYREL) 150 MG tablet Take 150 mg by mouth at bedtime.     acetaminophen (TYLENOL) 325 MG tablet Take 2 tablets (650 mg total) by mouth every 6 (six) hours. (Patient taking differently: Take 650 mg by mouth every 6 (six) hours as needed for mild pain.)     albuterol (VENTOLIN HFA) 108 (90 Base) MCG/ACT inhaler Inhale into the lungs every 6 (six) hours as needed for wheezing or shortness of breath.     azelastine (ASTELIN) 0.1 % nasal spray Place 1 spray into both nostrils at bedtime. Use in each nostril as directed     b complex vitamins capsule Take 1 capsule by mouth daily.     Cannabidiol POWD Apply 1 application topically as needed (pain). CBD Oil/CBD Cream     diltiazem (CARDIZEM) 30 MG tablet Take 1 tablet every 4 hours AS NEEDED for heart rate >100 as long as top blood pressure >100. (Patient taking differently: Take 30 mg by mouth as directed. Take 1 tablet ('30mg'$ )  every 4 hours AS NEEDED for heart rate >100 as long as top blood pressure >100.) 45 tablet 1   fluticasone-salmeterol (ADVAIR HFA) 230-21 MCG/ACT inhaler Inhale 2 puffs into the lungs 2 (two) times daily.     gabapentin (NEURONTIN) 300 MG capsule Take 300 mg by mouth 3 (three) times daily.     Golimumab (SIMPONI ARIA IV) Inject 1 Dose into the vein as directed. Every 2 months     methocarbamol (ROBAXIN-750) 750 MG tablet Take 1 tablet (750 mg total) by mouth 3 (three) times daily as needed for muscle spasms. 90 tablet 1   oxyCODONE-acetaminophen (PERCOCET) 7.5-325 MG tablet Take 1 tablet by  mouth every 4 (four) hours as needed for severe pain. 60 tablet 0   promethazine (PHENERGAN) 12.5 MG tablet Take 12.5 mg by mouth 3 (three) times daily as needed for nausea/vomiting.     sodium  chloride (OCEAN) 0.65 % SOLN nasal spray Place 1 spray into both nostrils as needed for congestion.     spironolactone (ALDACTONE) 100 MG tablet Take 100 mg by mouth in the morning. (Patient not taking: Reported on 10/05/2021)     Tenapanor HCl (IBSRELA) 50 MG TABS Take 50 mg by mouth 2 (two) times daily before a meal. 60 tablet 1   Current Facility-Administered Medications  Medication Dose Route Frequency Provider Last Rate Last Admin   0.9 %  sodium chloride infusion  500 mL Intravenous Once Madge Therrien, [name], MD        Allergies as of 10/05/2021   (No Known Allergies)    Family History  Problem Relation Age of Onset   Hypertension Mother    Thyroid disease Mother        hypothyroidism   Asthma Father    Heart disease Father        from scarlet fever; died age 61 MVA   Hypertension Sister    Hypothyroidism Sister    Other Maternal Aunt        fatty liver   Hypertension Maternal Grandmother    Heart failure Maternal Grandmother    Diabetes Paternal Grandmother    Heart failure Paternal Grandmother    Thyroid cancer Cousin    Colon cancer Neg Hx    Esophageal cancer Neg Hx    Pancreatic cancer Neg Hx    Stomach cancer Neg Hx    Breast cancer Neg Hx     Social History   Socioeconomic History   Marital status: Married    Spouse name: Not on file   Number of children: Not on file   Years of education: Not on file   Highest education level: Not on file  Occupational History   Not on file  Tobacco Use   Smoking status: Every Day    Packs/day: 0.75    Years: 25.00    Pack years: 18.75    Types: Cigarettes    Start date: 03/12/1971   Smokeless tobacco: Never  Vaping Use   Vaping Use: Never used  Substance and Sexual Activity   Alcohol use: Not Currently   Drug use: Never    Sexual activity: Not on file  Other Topics Concern   Not on file  Social History Narrative   Not on file   Social Determinants of Health   Financial Resource Strain: Not on file  Food Insecurity: Not on file  Transportation Needs: Not on file  Physical Activity: Not on file  Stress: Not on file  Social Connections: Not on file  Intimate Partner Violence: Not on file    Review of Systems: All other review of systems negative except as mentioned in the HPI.  Physical Exam: Vital signs BP 125/79   Pulse 71   Temp (!) 97.5 F (36.4 C)   Ht '5\' 7"'$  (1.702 m)   Wt 181 lb (82.1 kg)   LMP  (LMP Unknown)   SpO2 100%   BMI 28.35 kg/m   General:   Alert,  Well-developed, pleasant and cooperative in NAD Lungs:  Clear throughout to auscultation.   Heart:  Regular rate and rhythm Abdomen:  Soft, nontender and nondistended.   Neuro/Psych:  Alert and cooperative. Normal mood and affect. A and O x 3  Jolly Mango, MD [facility]

## 2021-10-05 NOTE — Patient Instructions (Addendum)
Thank you for letting us take care of your healthcare needs today. Please see handouts given to you on Polyps and Hemorrhoids.     YOU HAD AN ENDOSCOPIC PROCEDURE TODAY AT THE [facility]:   Refer to the procedure report that was given to you for any specific questions about what was found during the examination.  If the procedure report does not answer your questions, please call your gastroenterologist to clarify.  If you requested that your care partner not be given the details of your procedure findings, then the procedure report has been included in a sealed envelope for you to review at your convenience later.  YOU SHOULD EXPECT: Some feelings of bloating in the abdomen. Passage of more gas than usual.  Walking can help get rid of the air that was put into your GI tract during the procedure and reduce the bloating. If you had a lower endoscopy (such as a colonoscopy or flexible sigmoidoscopy) you may notice spotting of blood in your stool or on the toilet paper. If you underwent a bowel prep for your procedure, you may not have a normal bowel movement for a few days.  Please Note:  You might notice some irritation and congestion in your nose or some drainage.  This is from the oxygen used during your procedure.  There is no need for concern and it should clear up in a day or so.  SYMPTOMS TO REPORT IMMEDIATELY:  Following lower endoscopy (colonoscopy or flexible sigmoidoscopy):  Excessive amounts of blood in the stool  Significant tenderness or worsening of abdominal pains  Swelling of the abdomen that is new, acute  Fever of 100F or higher  For urgent or emergent issues, a gastroenterologist can be reached at any hour by calling [phone]. Do not use MyChart messaging for urgent concerns.    DIET:  We do recommend a small meal at first, but then you may proceed to your regular diet.  Drink plenty of fluids but you should avoid alcoholic beverages for 24  hours.  ACTIVITY:  You should plan to take it easy for the rest of today and you should NOT DRIVE or use heavy machinery until tomorrow (because of the sedation medicines used during the test).    FOLLOW UP: Our staff will call the number listed on your records 48-72 hours following your procedure to check on you and address any questions or concerns that you may have regarding the information given to you following your procedure. If we do not reach you, we will leave a message.  We will attempt to reach you two times.  During this call, we will ask if you have developed any symptoms of COVID 19. If you develop any symptoms (ie: fever, flu-like symptoms, shortness of breath, cough etc.) before then, please call [phone].  If you test positive for Covid 19 in the 2 weeks post procedure, please call and report this information to us.    If any biopsies were taken you will be contacted by phone or by letter within the next 1-3 weeks.  Please call us at [phone] if you have not heard about the biopsies in 3 weeks.    SIGNATURES/CONFIDENTIALITY: You and/or your care partner have signed paperwork which will be entered into your electronic medical record.  These signatures attest to the fact that that the information above on your After Visit Summary has been reviewed and is understood.  Full responsibility of the confidentiality of this discharge information   lies with you and/or your care-partner.  

## 2021-10-05 NOTE — Progress Notes (Signed)
Called to room to assist during endoscopic procedure.  Patient ID and intended procedure confirmed with present staff. Received instructions for my participation in the procedure from the performing physician.  

## 2021-10-05 NOTE — Op Note (Addendum)
[facility] Patient Name: Stephanie Ewing Procedure Date: 10/05/2021 11:17 AM MRN: [id number] Endoscopist: Remo Lipps P. Havery Moros , MD Age: 66 Referring MD:  Date of Birth: Jan 03, 1956 Gender: Female Account #: [id number] Procedure:                Colonoscopy Indications:              High risk colon cancer surveillance: Personal                            history of colonic polyps - 2013 with 6 polyps                            removed - no report available, then normal exam in                            2018 [facility] [place]), told to repeat in 5 years. Of                            note, patient has had refractory constipation to                            multiple regimens - Linzess, Trulance, Motegrity,                            Amitiza, but IBSrela has worked well for her but                            quite expensive, Medicines:                Monitored Anesthesia Care Procedure:                Pre-Anesthesia Assessment:                           - Prior to the procedure, a History and Physical                            was performed, and patient medications and                            allergies were reviewed. The patient's tolerance of                            previous anesthesia was also reviewed. The risks                            and benefits of the procedure and the sedation                            options and risks were discussed with the patient.                            All questions were answered, and informed consent  was obtained. Prior Anticoagulants: The patient has                            taken no previous anticoagulant or antiplatelet                            agents. ASA Grade Assessment: III - A patient with                            severe systemic disease. After reviewing the risks                            and benefits, the patient was deemed in                            satisfactory condition to undergo  the procedure.                           After obtaining informed consent, the colonoscope                            was passed under direct vision. Throughout the                            procedure, the patient's blood pressure, pulse, and                            oxygen saturations were monitored continuously. The                            Olympus PCF-H190DL (#[id number]) Colonoscope was                            introduced through the anus and advanced to the the                            cecum, identified by appendiceal orifice and                            ileocecal valve. The colonoscopy was performed                            without difficulty. The patient tolerated the                            procedure well. The quality of the bowel                            preparation was adequate. The ileocecal valve,                            appendiceal orifice, and rectum were photographed. Scope In: 11:23:52 AM Scope Out: 12:02:16 PM Scope Withdrawal Time: 0 hours 31 minutes 14 seconds  Total Procedure Duration: 0  hours 38 minutes 24 seconds  Findings:                 The perianal and digital rectal examinations were                            normal. Prior to anesthesia given, rectal exam                            revealed normal tone, and normal decent. No obvious                            pelvic floor dysfunction.                           A 3 mm polyp was found in the cecum. The polyp was                            sessile. The polyp was removed with a cold snare.                            Resection and retrieval were complete.                           A 25 mm polyp was found in the cecum. The polyp was                            flat and located behind the backside of the IC                            valve with extension up to the edge of the valve,                            but did not invade it. It was rather difficult to                            graps with the  snare. The polyp was removed with a                            piecemeal technique using a cold snare. Resection                            and retrieval were complete. The polyp appeared to                            be entirely removed. One focal area of the                            polypectomy defect seemed slightly deeper than the                            rest of the polypectomy site, but no overt evidence  of perforation or deeper levels of mucosa seen.                            None the less, one hemostasis clip was placed                            across the deeper aspect of the polypectomy site to                            close it.                           A 10 mm polyp was found in the transverse colon.                            The polyp was flat. The polyp was removed with a                            cold snare. Resection and retrieval were complete.                           Two sessile polyps were found in the sigmoid colon.                            The polyps were 3 mm in size. These polyps were                            removed with a cold snare. Resection and retrieval                            were complete.                           Internal hemorrhoids were found during retroflexion.                           The exam was otherwise without abnormality. Complications:            No immediate complications. Estimated blood loss:                            Minimal. Estimated Blood Loss:     Estimated blood loss was minimal. Impression:               - One 3 mm polyp in the cecum, removed with a cold                            snare. Resected and retrieved.                           - One 25 mm polyp in the cecum, removed piecemeal                            using a cold snare. Resected and  retrieved. Clip                            was placed as outlined.                           - One 10 mm polyp in the transverse colon, removed                             with a cold snare. Resected and retrieved.                           - Two 3 mm polyps in the sigmoid colon, removed                            with a cold snare. Resected and retrieved.                           - Internal hemorrhoids.                           - The examination was otherwise normal.                           - No obvious evidence of pelvic floor dysfunction                            on DRE. Recommendation:           - Patient has a contact number available for                            emergencies. The signs and symptoms of potential                            delayed complications were discussed with the                            patient. Return to normal activities tomorrow.                            Written discharge instructions were provided to the                            patient.                           - Resume previous diet.                           - Continue present medications.                           - Await pathology results.                           - Will try to appeal  with insurance company for                            coverage for [place]. Fransheska Willingham, MD 10/05/2021 12:16:41 PM This report has been signed electronically.

## 2021-10-05 NOTE — Progress Notes (Signed)
Pt's states no medical or surgical changes since previsit or office visit. 

## 2021-10-05 NOTE — Progress Notes (Signed)
Pt non-responsive, VVS, Report to RN  °

## 2021-10-06 ENCOUNTER — Telehealth: Payer: Self-pay

## 2021-10-06 NOTE — Telephone Encounter (Signed)
Left message on answering machine. 

## 2021-10-07 ENCOUNTER — Ambulatory Visit: Payer: Medicare Other | Admitting: Gastroenterology

## 2021-10-07 ENCOUNTER — Ambulatory Visit: Payer: Medicare Other | Admitting: Cardiology

## 2021-11-18 ENCOUNTER — Encounter: Payer: Self-pay | Admitting: Gastroenterology

## 2021-11-30 ENCOUNTER — Other Ambulatory Visit (HOSPITAL_COMMUNITY): Payer: Self-pay

## 2021-12-01 ENCOUNTER — Other Ambulatory Visit (HOSPITAL_COMMUNITY): Payer: Self-pay

## 2021-12-05 ENCOUNTER — Other Ambulatory Visit: Payer: Self-pay | Admitting: Gastroenterology

## 2021-12-05 MED ORDER — LUBIPROSTONE 24 MCG PO CAPS: 24.0000 ug | ORAL_CAPSULE | Freq: Two times a day (BID) | ORAL | 1 refills | Status: DC

## 2021-12-20 ENCOUNTER — Other Ambulatory Visit: Payer: Self-pay | Admitting: *Deleted

## 2021-12-20 ENCOUNTER — Encounter: Payer: Self-pay | Admitting: *Deleted

## 2021-12-20 NOTE — Patient Outreach (Signed)
  Care Coordination   Initial Visit Note   12/20/2021 Name: Stephanie Ewing MRN: [id number] DOB: May 08, 1956  Stephanie Ewing is a 66 y.o. year old female who sees Monico Blitz, MD for primary care. I spoke with  [name] by phone today  What matters to the patients health and wellness today?  Remain stable.  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    Goals Addressed               This Visit's Progress     Obtain AWV (pt-stated)        Care Coordination Interventions: Advised patient to discuss scheduling AWV with provider Assessed social determinant of health barriers         SDOH assessments and interventions completed:  Yes     Care Coordination Interventions Activated:  Yes  Care Coordination Interventions:  Yes, provided   Follow up plan: No further intervention required.   Encounter Outcome:  Pt. Visit Completed   Valente David, RN, MSN, [facility] Care Coordinator [phone]

## 2022-01-06 ENCOUNTER — Encounter: Payer: Self-pay | Admitting: Cardiology

## 2022-01-06 ENCOUNTER — Ambulatory Visit (INDEPENDENT_AMBULATORY_CARE_PROVIDER_SITE_OTHER): Payer: Medicare Other | Admitting: Cardiology

## 2022-01-06 VITALS — BP 122/80 | HR 73 | Ht 67.0 in | Wt 171.6 lb

## 2022-01-06 DIAGNOSIS — I480 Paroxysmal atrial fibrillation: Secondary | ICD-10-CM | POA: Diagnosis not present

## 2022-01-06 DIAGNOSIS — I351 Nonrheumatic aortic (valve) insufficiency: Secondary | ICD-10-CM

## 2022-01-06 DIAGNOSIS — I152 Hypertension secondary to endocrine disorders: Secondary | ICD-10-CM | POA: Diagnosis not present

## 2022-01-06 NOTE — Progress Notes (Signed)
Clinical Summary Ms. Samons is a 66 y.o.female seen today for follow up of the following medical problems.    Previously followed by Dr Ruel Favors at [facility]     1. PAF - beta blocker stopped by endocrinology given bp imroved on aldactone alone    CHADS2Vasc score (HTN, woman)2, has been on eliquis.  - she reports 2 episodes of afib in setting of hypokalemia. From chart review appears K was low to mid 2 range both times.   - 02/2017 event monitor without afib/aflutter, did have some PACs/PVCs, short runs SVT.  - anticoag was stopped due to isolated events with no clear recurrence        06/2020 event monitor SR and rare PVCs, no afib or aflutter  - recent increase in PACs by symptoms and by watch monitor, reports watch reported 2 isolated episodes of afib but exact diagnosis unclear. Symptoms recently have resolved.      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 No evidence of renal artery stenosis by [place]     - compliant with meds - recent low bp's with standing she reports, was on aggressive diet. LIghtheaded and dizzy.  - endocrinologist lowered aldactone, symptoms improved   3. Hyperaldosteronism - followed at [facility]. Recs for medical management after renal vein renin sampling did not localize - aldactone increased to '100mg'$  daily - still followed at [facility]     4. Leg tremors - being worked up at [facility] by neurology     5. Mild to moderate AI - noted by echo 2018     -  10/2019 echo LVEF 60-65%, mod AI - no recent symptoms        6. 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  Lexiscan nuclear stress test was normal. 02/05/2017 nuclear stress test: Lexiscan nuclear stress tes tNo significant EKG changes. Perfusion scan is normal with a normal ejection fraction and no ischemia. This is considered low risk result     Retired respiratory therapist   Past Medical History:  Diagnosis Date   Afib ([place])    Arthritis    psoratic   Asthma    "weak"    Constipation    Depression    Dysrhythmia    AFIb   GERD (gastroesophageal reflux disease)    History of hiatal hernia    Hyperaldosteronism (HCC)    Hypertension    Hypokalemia    Hypothyroidism    Insomnia    Migraine    ocassional   Nicotine dependence, cigarettes, uncomplicated    Pancreatitis    PONV (postoperative nausea and vomiting)    Pre-diabetes    Thyroid disease    hypothyroid     No Known Allergies   Current Outpatient Medications  Medication Sig Dispense Refill   acetaminophen (TYLENOL) 325 MG tablet Take 2 tablets (650 mg total) by mouth every 6 (six) hours. (Patient taking differently: Take 650 mg by mouth every 6 (six) hours as needed for mild pain.)     albuterol (VENTOLIN HFA) 108 (90 Base) MCG/ACT inhaler Inhale into the lungs every 6 (six) hours as needed  for wheezing or shortness of breath.     aspirin EC 81 MG tablet Take 1 tablet (81 mg total) by mouth at bedtime. 30 tablet 11   azelastine (ASTELIN) 0.1 % nasal spray Place 1 spray into both nostrils at bedtime. Use in each nostril as directed     b complex vitamins capsule Take 1 capsule by mouth daily.     Cannabidiol POWD Apply 1 application topically as needed (pain). CBD Oil/CBD Cream     Cholecalciferol (VITAMIN D) 50 MCG (2000 UT) CAPS Take 2,000 Units by mouth in the morning.     diltiazem (CARDIZEM) 30 MG tablet Take 1 tablet every 4 hours AS NEEDED for heart rate >100 as long as top blood pressure >100. (Patient taking differently: Take 30 mg by mouth as directed. Take 1 tablet ('30mg'$ )  every 4 hours AS NEEDED for heart  rate >100 as long as top blood pressure >100.) 45 tablet 1   diphenhydrAMINE (BENADRYL) 25 MG tablet Take 100 mg by mouth at bedtime.     fexofenadine (ALLEGRA) 180 MG tablet Take 180 mg by mouth in the morning.     fluticasone-salmeterol (ADVAIR HFA) 230-21 MCG/ACT inhaler Inhale 2 puffs into the lungs 2 (two) times daily.     gabapentin (NEURONTIN) 300 MG capsule Take 300 mg by mouth 3 (three) times daily.     Golimumab (SIMPONI ARIA IV) Inject 1 Dose into the vein as directed. Every 2 months     lubiprostone (AMITIZA) 24 MCG capsule Take 1 capsule (24 mcg total) by mouth 2 (two) times daily with a meal. Please keep your appointment on 9-6 for further refills. Thank you 60 capsule 1   methocarbamol (ROBAXIN-750) 750 MG tablet Take 1 tablet (750 mg total) by mouth 3 (three) times daily as needed for muscle spasms. 90 tablet 1   omeprazole (PRILOSEC) 40 MG capsule Take 1 capsule (40 mg total) by mouth 2 (two) times daily as needed. 180 capsule 3   oxyCODONE-acetaminophen (PERCOCET) 7.5-325 MG tablet Take 1 tablet by mouth every 4 (four) hours as needed for severe pain. 60 tablet 0   polyethylene glycol (MIRALAX) 17 g packet Take 17 g by mouth daily as needed. (Patient taking differently: Take 17 g by mouth daily.) 14 each 0   promethazine (PHENERGAN) 12.5 MG tablet Take 12.5 mg by mouth 3 (three) times daily as needed for nausea/vomiting.     RESTASIS 0.05 % ophthalmic emulsion Place 1 drop into both eyes 2 (two) times daily.     sodium chloride (OCEAN) 0.65 % SOLN nasal spray Place 1 spray into both nostrils as needed for congestion.     spironolactone (ALDACTONE) 100 MG tablet Take 100 mg by mouth in the morning. (Patient not taking: Reported on 10/05/2021)     spironolactone (ALDACTONE) 50 MG tablet 1 tablet     SYNTHROID 125 MCG tablet Take 125 mcg by mouth daily before breakfast.     Tenapanor HCl (IBSRELA) 50 MG TABS Take 50 mg by mouth 2 (two) times daily before a meal. 60 tablet 1    traZODone (DESYREL) 100 MG tablet Take 100 mg by mouth at bedtime.     traZODone (DESYREL) 150 MG tablet Take 150 mg by mouth at bedtime.     No current facility-administered medications for this visit.     Past Surgical History:  Procedure Laterality Date   ANTERIOR CERVICAL DECOMPRESSION/DISCECTOMY FUSION 4 LEVELS N/A 07/20/2020   Procedure: ANTERIOR CERVICAL DECOMPRESSION FUSION CERVICAL FOUR-CERVICAL  FIVE, CERVICAL FIVE-CERVICAL SIX, CERVICAL SIX-CERVICAL SEVEN;  Surgeon: Consuella Lose, MD;  Location: [place];  Service: Neurosurgery;  Laterality: N/A;   BIOPSY  06/28/2020   Procedure: BIOPSY;  Surgeon: Rush Landmark Telford Nab., MD;  Location: [facility];  Service: Gastroenterology;;   CHOLECYSTECTOMY N/A 12/30/2020   Procedure: LAPAROSCOPIC CHOLECYSTECTOMY;  Surgeon: Ileana Roup, MD;  Location: [facility];  Service: General;  Laterality: N/A;   elbow surgery Right    ESOPHAGOGASTRODUODENOSCOPY (EGD) WITH PROPOFOL N/A 06/28/2020   Procedure: ESOPHAGOGASTRODUODENOSCOPY (EGD) WITH PROPOFOL;  Surgeon: Irving Copas., MD;  Location: [facility];  Service: Gastroenterology;  Laterality: N/A;   ESOPHAGOGASTRODUODENOSCOPY (EGD) WITH PROPOFOL N/A 08/05/2020   Procedure: ESOPHAGOGASTRODUODENOSCOPY (EGD) WITH PROPOFOL;  Surgeon: Rush Landmark Telford Nab., MD;  Location: [place];  Service: Gastroenterology;  Laterality: N/A;   EUS N/A 06/28/2020   Procedure: UPPER ENDOSCOPIC ULTRASOUND (EUS) LINEAR;  Surgeon: Irving Copas., MD;  Location: [facility];  Service: Gastroenterology;  Laterality: N/A;   EUS N/A 08/05/2020   Procedure: UPPER ENDOSCOPIC ULTRASOUND (EUS) RADIAL;  Surgeon: Irving Copas., MD;  Location: [place];  Service: Gastroenterology;  Laterality: N/A;   FINE NEEDLE ASPIRATION  06/28/2020   Procedure: FINE NEEDLE ASPIRATION (FNA) LINEAR;  Surgeon: Irving Copas., MD;  Location: [facility];  Service: Gastroenterology;;   FINE  NEEDLE ASPIRATION  08/05/2020   Procedure: FINE NEEDLE ASPIRATION;  Surgeon: Irving Copas., MD;  Location: [place];  Service: Gastroenterology;;   POLYPECTOMY  06/28/2020   Procedure: POLYPECTOMY;  Surgeon: Irving Copas., MD;  Location: [facility];  Service: Gastroenterology;;   SHOULDER SURGERY Bilateral    inpingement release   TOTAL ABDOMINAL HYSTERECTOMY  2015   ULNAR NERVE REPAIR Right      No Known Allergies    Family History  Problem Relation Age of Onset   Hypertension Mother    Thyroid disease Mother        hypothyroidism   Asthma Father    Heart disease Father        from scarlet fever; died age 34 MVA   Hypertension Sister    Hypothyroidism Sister    Other Maternal Aunt        fatty liver   Hypertension Maternal Grandmother    Heart failure Maternal Grandmother    Diabetes Paternal Grandmother    Heart failure Paternal Grandmother    Thyroid cancer Cousin    Colon cancer Neg Hx    Esophageal cancer Neg Hx    Pancreatic cancer Neg Hx    Stomach cancer Neg Hx    Breast cancer Neg Hx      Social History Ms. Messamore reports that she has been smoking cigarettes. She started smoking about 50 years ago. She has a 18.75 pack-year smoking history. She has never used smokeless tobacco. Ms. Klingel reports that she does not currently use alcohol.   Review of Systems CONSTITUTIONAL: No weight loss, fever, chills, weakness or fatigue.  HEENT: Eyes: No visual loss, blurred vision, double vision or yellow sclerae.No hearing loss, sneezing, congestion, runny nose or sore throat.  SKIN: No rash or itching.  CARDIOVASCULAR: per hpi RESPIRATORY: No shortness of breath, cough or sputum.  GASTROINTESTINAL: No anorexia, nausea, vomiting or diarrhea. No abdominal pain or blood.  GENITOURINARY: No burning on urination, no polyuria NEUROLOGICAL: No headache, dizziness, syncope, paralysis, ataxia, numbness or tingling in the extremities. No change in  bowel or bladder control.  MUSCULOSKELETAL: No muscle, back pain, joint pain or stiffness.  LYMPHATICS: No enlarged nodes. No history of splenectomy.  PSYCHIATRIC: No history of depression or anxiety.  ENDOCRINOLOGIC: No reports of sweating, cold or heat intolerance. No polyuria or polydipsia.  Marland Kitchen   Physical Examination Today's Vitals   01/06/22 1148  BP: 122/80  Pulse: 73  SpO2: 96%  Weight: 171 lb 9.6 oz (77.8 kg)  Height: '5\' 7"'$  (1.702 m)   Body mass index is 26.88 kg/m.  Gen: resting comfortably, no acute distress HEENT: no scleral icterus, pupils equal round and reactive, no palptable cervical adenopathy,  CV: RRR,no mr/g no jvd Resp: Clear to auscultation bilaterally GI: abdomen is soft, non-tender, non-distended, normal bowel sounds, no hepatosplenomegaly MSK: extremities are warm, no edema.  Skin: warm, no rash Neuro:  no focal deficits Psych: appropriate affect   Diagnostic Studies  01/2017 Nuclear perfusion stress test: 1. Normal myocardial perfusion imaging without ischemia or infarction. 2. Normal post-stress LVEF, 55%. Normal regional wall motion. TID 0.93. 3. Patient achieved 10 METs by Bruce protocol without chest pain or EKG changes but could not reach heart rate target. With vasodilator administration, no EKG changes. Patient reported chest pain with vasodilator, a non-specific finding. 4. Hypertensive response to exercise. 5. No prior study available for comparison. Electronically signed Dr Hurshel Party, MD 02/06/17 9:35 AM  01/2017 Echocardiogram: Mild sclerosis of the trileaflet aortic valve, with adequate leaflet motion. Mild-to-moderate aortic regurgitation is present. The left atrial size is normal. There is normal left ventricular size. Global left ventricular systolic function is normal. The estimated left ventricular ejection fraction is 60% Normal left ventricular diastolic filling pattern. Normal right ventricle size and function. Normal  RVSP ---------------------------------------------------------------- Electronically signed by Ruel Favors W(Interpreting physician) on 01/18/2017 06:58 PM ----------------------------------------------------------------   02/2017 Thirty day event monitor: 1. Ventricular arrhythmias occasional PVCs with no couplets triplets or runs of ventricular tachycardia 2. Supraventricular arrhythmias occasional PACs with occasional short runs of nonsustained SVT 3. Atrial fibrillation/atrial flutter none 4. Bradycardia arrhythmias no significant Brady arrhythmias 5. Symptom correlation with arrhythmias patient's symptoms did occur with atrial and ventricular ectopy and short runs of nonsustained SVT  Impressions: Occasional PVCs with no high-grade ventricular ectopy Occasional PACs with occasional brief short runs of nonsustained SVT Symptoms occurred with PACs and PVCs and short runs of nonsustained SVT Overall no hemodynamically significant arrhythmias present  Electronically signed by: Holly Bodily, DO, [facility]       Jan 2022 CTA IMPRESSION: 1. No aortic dissection or acute aortic abnormality. No pulmonary embolus. 2. Minor distal esophageal wall thickening, can be seen with reflux or esophagitis. 3. Improved peripancreatic fat stranding from last month. 4. Moderate colonic stool burden with tortuous sigmoid colon, suggesting constipation.     Assessment and Plan   1. PAF - 2 isolated episodes of afib in setting of severe hypokalemia. No clear recurence, including by a 30 day event monitor by her previous cardiologist and a repeat monitor here in 06/2020  - had a period of increased PACs and possibly 2 isolated episodes of afib according to her watch, however most recently symptoms have resolved - if recurrent would repeat heart monitor.    2. Resistant HTN - significnatly improved on aldactone, she was diagnosed with hyperaldo -aldo dosing per endocrine, recently with  weight loss and orthostatic symptoms dose was decreased   3. Aortic regurgitation - moderate by last echo - repeat echo     Arnoldo Lenis, M.D.

## 2022-01-06 NOTE — Patient Instructions (Addendum)
Medication Instructions:  Continue all current medications.  Labwork: none  Testing/Procedures: Your physician has requested that you have an echocardiogram. Echocardiography is a painless test that uses sound waves to create images of your heart. It provides your doctor with information about the size and shape of your heart and how well your heart's chambers and valves are working. This procedure takes approximately one hour. There are no restrictions for this procedure.  Office will contact with results via phone, letter or mychart.     Follow-Up: 6 months   Any Other Special Instructions Will Be Listed Below (If Applicable).   If you need a refill on your cardiac medications before your next appointment, please call your pharmacy.

## 2022-01-09 ENCOUNTER — Ambulatory Visit: Payer: Medicare Other | Attending: Cardiology

## 2022-01-09 DIAGNOSIS — I351 Nonrheumatic aortic (valve) insufficiency: Secondary | ICD-10-CM | POA: Insufficient documentation

## 2022-01-09 LAB — ECHOCARDIOGRAM COMPLETE
AR max vel: 1.29 cm2
AV Area VTI: 1.37 cm2
AV Area mean vel: 1.22 cm2
AV Mean grad: 5.7 mmHg
AV Peak grad: 10.1 mmHg
AV Vena cont: 0.18 cm
Ao pk vel: 1.59 m/s
Area-P 1/2: 3.08 cm2
Calc EF: 71.4 %
MV M vel: 2.59 m/s
MV Peak grad: 26.8 mmHg
P 1/2 time: 512 msec
S' Lateral: 2.63 cm
Single Plane A2C EF: 69.8 %
Single Plane A4C EF: 74.9 %

## 2022-01-13 ENCOUNTER — Encounter: Payer: Self-pay | Admitting: *Deleted

## 2022-01-17 ENCOUNTER — Telehealth: Payer: Self-pay

## 2022-01-17 NOTE — Telephone Encounter (Signed)
Patient notified and verbalized understanding. Patient had no questions or concerns at this time. PCP copied 

## 2022-01-17 NOTE — Telephone Encounter (Signed)
-----   Message from Arnoldo Lenis, MD sent at 01/17/2022 10:35 AM EDT ----- Echo shows heart pumping function is normal. Mild leak of mitral valve and aortic valve neither is of concern and would just need to be rechecked in roughly 3 years.   Zandra Abts MD

## 2022-01-18 ENCOUNTER — Ambulatory Visit: Payer: Medicare Other | Admitting: Gastroenterology

## 2022-01-18 ENCOUNTER — Encounter: Payer: Self-pay | Admitting: Gastroenterology

## 2022-01-18 ENCOUNTER — Ambulatory Visit (INDEPENDENT_AMBULATORY_CARE_PROVIDER_SITE_OTHER): Payer: Medicare Other | Admitting: Gastroenterology

## 2022-01-18 VITALS — BP 118/74 | HR 79 | Ht 67.0 in | Wt 171.2 lb

## 2022-01-18 DIAGNOSIS — Z86010 Personal history of colonic polyps: Secondary | ICD-10-CM

## 2022-01-18 DIAGNOSIS — K5900 Constipation, unspecified: Secondary | ICD-10-CM

## 2022-01-18 DIAGNOSIS — D132 Benign neoplasm of duodenum: Secondary | ICD-10-CM | POA: Diagnosis not present

## 2022-01-18 DIAGNOSIS — R933 Abnormal findings on diagnostic imaging of other parts of digestive tract: Secondary | ICD-10-CM

## 2022-01-18 DIAGNOSIS — K219 Gastro-esophageal reflux disease without esophagitis: Secondary | ICD-10-CM

## 2022-01-18 DIAGNOSIS — Z23 Encounter for immunization: Secondary | ICD-10-CM | POA: Diagnosis not present

## 2022-01-18 DIAGNOSIS — Z79899 Other long term (current) drug therapy: Secondary | ICD-10-CM | POA: Diagnosis not present

## 2022-01-18 DIAGNOSIS — K760 Fatty (change of) liver, not elsewhere classified: Secondary | ICD-10-CM

## 2022-01-18 MED ORDER — BACLOFEN 10 MG PO TABS: 10.0000 mg | ORAL_TABLET | Freq: Every day | ORAL | 1 refills | Status: DC

## 2022-01-18 MED ORDER — IBSRELA 50 MG PO TABS: 50.0000 mg | ORAL_TABLET | Freq: Two times a day (BID) | ORAL | 1 refills | Status: DC

## 2022-01-18 NOTE — Patient Instructions (Addendum)
Continue omeprazole twice daily.   Discontinue Robaxin.   You have been give the Hepatitis A vaccine today. We will contact you in 6 months to have the next vaccine.  We will also contact you in March 2024 to schedule your repeat MRCP.  We have sent the following medications to your pharmacy for you to pick up at your convenience: Baclofen 10 mg at bedtime and Ibsrela.   You have been scheduled for an endoscopy. Please follow written instructions given to you at your visit today. If you use inhalers (even only as needed), please bring them with you on the day of your procedure.  The [place] [facility] providers would like to encourage you to use [facility] to communicate with providers for non-urgent requests or questions.  Due to long hold times on the telephone, sending your provider a message by [facility] may be a faster and more efficient way to get a response.  Please allow 48 business hours for a response.  Please remember that this is for non-urgent requests.   Due to recent changes in healthcare laws, you may see the results of your imaging and laboratory studies on MyChart before your provider has had a chance to review them.  We understand that in some cases there may be results that are confusing or concerning to you. Not all laboratory results come back in the same time frame and the provider may be waiting for multiple results in order to interpret others.  Please give [place] 48 hours in order for your provider to thoroughly review all the results before contacting the office for clarification of your results.

## 2022-01-18 NOTE — Progress Notes (Signed)
HPI :  65 year old female here for follow-up visit for a few issues today. Recall she has history of pancreatitis and abnormal imaging of her pancreas, constipation, GERD, history of colon polyps.  See prior notes for details of her case. I first met her as an inpatient consult in December 2021 when she was hospitalized for pancreatitis. Recall that she has a history of psoriatic arthritis. She was placed on methotrexate in early September. She developed pancreatitis and was hospitalized from November 30 to December 2. Ultrasound at that time showed an unremarkable gallbladder and CBD. Her liver enzymes were normal. She had a CT scan with contrast at that time showing pancreatic head and proximal body edema with peripancreatic fat stranding consistent with acute pancreatitis. She was treated conservatively.  Unfortunately was readmitted at the end of that month with recurrence, a repeat CT scan showed peripancreatic infiltration at the head of the pancreas consistent with focal pancreatitis. She was again treated conservatively and ultimately improved and discharged.   She has not been drinking any alcohol and never has. Lipids were normal.. Her great grandfather had pancreatic cancer but no other family history of pancreatitis or pancreatic cancer. She had been taking Singulair which has been rarely reported to cause pancreatitis and we recommended that she stop it at the time of her last admission. She had an IgG4 sent which was normal.   A follow-up MRCP was done February 2022.  That exam was remarkable for a roughly 3.4 cm pancreatic head mass.  She subsequently was referred to Dr. Rush Landmark for EUS which was done in February 2022 report as below, she had a heterogeneous region in the pancreatic head, not clearly a mass, biopsied with FNA which showed "atypical cells".  She had a follow-up repeat EUS with Dr. Rush Landmark in March 2022, again biopsied and rare atypical cells noted, no evidence of  malignancy.  She inquired about a second opinion and was referred to [facility].  She had another EUS in April 2022 at [facility], no mass was noted.  She incidentally had 2 small cysts in her pancreas.  Also on prior EUS's she has noted to have gallstones.  In light of lack of clear cause for her pancreatitis she underwent cholecystectomy in August of last year, recovered from that okay.   She has felt well since that time without any recurrence of pancreatitis.  No abdominal pains that been bothering her.  She had a follow-up MRCP with me at our last visit, done in March.  Results as below.  No pancreatic mass but she does have some fatty infiltration of the pancreas with some associated atrophy, and some subcentimeter cystic lesions in the pancreas, radiology recommended repeat MRI in 1 year.  Noted on this exam was fatty liver and hepatomegaly.  She denies any alcohol use.  Her weights been pretty stable for the most part.  Her BMI is in the mid 60s.  She is not immune to hepatitis A or B, recommended vaccines to this.  She has not done that yet.  She states she received the hepatitis B vaccine a few times in the past and "it did not take".  She is not sure about receiving hepatitis A vaccine in the past.  Her liver enzymes showed ALT mildly elevated to the low 40s.  Recall she has had severe constipation that has been bothering her lately.  She is intolerant of high-dose MiraLAX, did not work well for her, she has been using Dulcolax as needed  for her but having a bowel movement once every 1 and half to 2 weeks.  I tried her on some Linzess at her last visit which helped a little bit, transitioned her to trial as of Motegrity, Amitiza, Trulance, more recently [place].  She states the only thing that really worked for her was Orpah Cobb but it was not covered by insurance.  I performed her colonoscopy in May of this year.  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  I  recommended a repeat colonoscopy in 6 to 12 months for surveillance of this.  During that time I performed a DRE which showed no evidence of pelvic floor dysfunction.  Currently she is taking Dulcolax as needed and really struggling with constipation.  No blood in her stools.  Finally she discusses her symptoms of GERD that of been bothering her.  She is on omeprazole 40 mg twice daily but still having a lot of nocturnal reflux symptoms that bother her.  She has a lot of regurgitation and pyrosis.  Her daytime symptoms are not as bad.  Higher dosing of omeprazole has helped but certainly not resolved.  She tries to avoid eating prior to bed.  She did have a small hiatal hernia noted on prior endoscopies, and some mild esophagitis noted on prior EGDs which healed up on PPI.  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 Rush Landmark on one of the EGDs.  She has been followed by cardiology for history of atrial fibrillation which is very intermittent she is not on any anticoagulation.  She had an echocardiogram done last month which showed only mild AR, normal EF.    Prior work-up: CTA 05/26/20 - IMPRESSION: 1. No aortic dissection or acute aortic abnormality. No pulmonary embolus. 2. Minor distal esophageal wall thickening, can be seen with reflux or esophagitis. 3. Improved peripancreatic fat stranding from last month. 4. Moderate colonic stool burden with tortuous sigmoid colon, suggesting constipation.      MRCP 06/23/20:IMPRESSION: 1. Findings are highly concerning for primary pancreatic neoplasm in the inferior aspect of the pancreatic head measuring approximately 3.4 x 2.2 x 2.4 cm. This exerts mild mass effect upon the adjacent distal pancreatic duct and distal common bile duct just proximal to the ampulla, without frank ductal obstruction at this time. The possibility of focal pancreatitis is not excluded, however, further evaluation with endoscopic ultrasound is  recommended in the near future to better evaluate this region and assess need for potential biopsy. 2. Multiple tiny subcentimeter cystic appearing lesions scattered elsewhere throughout the pancreas, favored to represent small benign lesions such as side-branch IPMN and/or small pancreatic pseudocysts. Repeat abdominal MRI with and without IV gadolinium with MRCP is recommended in 1 year to ensure stability of these lesions. This recommendation follows ACR consensus guidelines: Management of Incidental Pancreatic Cysts: A White Paper of the ACR Incidental Findings Committee. Beecher 5449;20:100-712.     EUS 06/28/20: EGD Impression: - No gross lesions in esophagus proximally. LA Grade B esophagitis with no bleeding distally. - Shatzki ring present. - Hiatal hernia noted. - Erythematous mucosa in the gastric body, incisura and antrum. No other gross lesions in the stomach. Biopsied. - Duodenal polyp in D2 noted. This was removed with cold snare polypectomy. - No gross lesions in the duodenal bulb, in the first portion of the duodenum and in the second portion of the duodenum. Biopsied. - Normal major papilla. EUS  Impression: - A heterogenous region/lesion was identified in the pancreatic head. However, the endosonographic appearance is not typical for adenocarcinoma. This could be inflammatory in nature. But with MRI findings and unexplained pancreatitis, FNB was performed. If malignancy is identified, this will be staged T2 N0 Mx by endosonographic criteria. - Multiple cystic lesions were seen in the pancreatic head, pancreatic body and pancreatic tail. Tissue has not been obtained. 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 - There was no sign of significant pathology in the common bile duct and in the common hepatic duct. - No malignant-appearing lymph nodes were visualized in the celiac region (level  20), peripancreatic region and porta hepatis region.   FINAL MICROSCOPIC DIAGNOSIS:   A. STOMACH, BIOPSY:  - Mild chronic gastritis.  - Warthin-Starry stain is negative for Helicobacter pylori.   B. DUODENUM, BIOPSY:  - No significant histopathologic findings.  - Negative for increased intraepithelial lymphocytes and villous  architectural changes.   C. DUODENUM, POLYPECTOMY:  - Adenoma.  - Negative for high grade dysplasia.    FINAL MICROSCOPIC DIAGNOSIS:  - Atypical cells present      EUS 08/05/20: EGD Impression: - No gross lesions in esophagus. Non-obstructing Schatzki ring. - 2 cm hiatal hernia. - No gross lesions in the stomach. - No gross lesions in the duodenal bulb, in the first portion of the duodenum and in the second portion of the duodenum. EUS Impression: - A region of hypointensity was identified in the pancreatic head. This region looks improved and smaller than prior procedure. Because of previous atypical cells noted on lastEUS FNB, tissue was obtained from this exam, and results are pending to try to rule out a malignant process. However, the endosonographic appearance today, with improving features, seems to make the possibility of inflammatory changes more possible though malignancy is not ruled out as of yet. If this turns out to be a malignant process, then this will be staged T1 N0 Mx by endosonographic criteria. Fine needle biopsy performed. - Multiple stones were visualized endosonographically in the gallbladder. - There was no sign of significant pathology in the common bile duct and in the common hepatic duct (smaller than previous EUS). - No malignant-appearing lymph nodes were visualized in the celiac region (level 20), peripancreatic region and porta hepatis region.   CYTOLOGY - NON PAP  CASE: [id number]  PATIENT: Stephanie Ewing  Non-Gynecological Cytology Report  Clinical History: Pancreatic mass  FINAL MICROSCOPIC DIAGNOSIS:   A.  PANCREAS, HEAD, FINE NEEDLE ASPIRATION:  -  Rare atypical cells present  -  See comment   COMMENT:   There are atypical cells present; insufficient for diagnosis of  malignancy.  In addition, there is minimal inflammation present.  Dr.  Jeannie Done reviewed the case and agrees with the above diagnosis.      MRCP 07/21/2021: IMPRESSION: 1. Fatty infiltration and mild atrophy in the head and body of the pancreas. Several subcentimeter cystic foci throughout the pancreas most likely represent prominent ductal side branches or possibly small cysts/pseudocysts. No pancreatic ductal dilatation. Consider follow-up MRI in 12 months. 2. Hepatomegaly with hepatic steatosis.  Repeat MRCP in 12 months   LFTs in March - ALT 42, AST 23, AP 72, T bil 0.4  LFTs 09/15/21 - ALT 38, AST 19, AP 68, T bil 0.4  Iron studies and autoimmune workup negative Negative Hep B / C Not immune to hep A and B -   High risk colon cancer surveillance: Personal history  of colonic polyps - 2013 with 6 polyps removed - no report available, then normal exam in 2018 [facility]), told to repeat in 5 years. Of note, patient has had refractory constipation to multiple regimens - Linzess, Trulance, Motegrity, Amitiza, but IBSrela has worked well for her but quite expensive,  Colonoscopy 10/05/21: One 3 mm polyp in the cecum, removed with a cold snare. Resected and retrieved. - One 25 mm polyp in the cecum, removed piecemeal using a cold snare. Resected and retrieved. Clip was placed as outlined. - One 10 mm polyp in the transverse colon, removed with a cold snare. Resected and retrieved. - Two 3 mm polyps in the sigmoid colon, removed with a cold snare. Resected and retrieved. - Internal hemorrhoids. - The examination was otherwise normal. - No obvious evidence of pelvic floor dysfunction on DRE.  1. Surgical [P], colon, cecum, polyp (1) - TUBULAR ADENOMA WITHOUT HIGH GRADE DYSPLASIA. 2. Surgical [P], colon, cecum,  polyp (1) - MULTIPLE FRAGMENTS OF SESSILE SERRATED POLYP WITHOUT CYTOLOGIC DYSPLASIA. 3. Surgical [P], colon, transverse, sigmoid, polyp (3) - SESSILE SERRATED POLYP WITHOUT CYTOLOGIC DYSPLASIA. - HYPERPLASTIC POLYP (S).  F/u 6-12 months   Echo 01/09/22: EF 60-65%      Past Medical History:  Diagnosis Date   Afib ([place])    Arthritis    psoratic   Asthma    "weak"    Constipation    Depression    Dysrhythmia    AFIb   GERD (gastroesophageal reflux disease)    History of hiatal hernia    Hyperaldosteronism (HCC)    Hypertension    Hypokalemia    Hypothyroidism    Insomnia    Migraine    ocassional   Nicotine dependence, cigarettes, uncomplicated    Pancreatitis    PONV (postoperative nausea and vomiting)    Pre-diabetes    Thyroid disease    hypothyroid     Past Surgical History:  Procedure Laterality Date   ANTERIOR CERVICAL DECOMPRESSION/DISCECTOMY FUSION 4 LEVELS N/A 07/20/2020   Procedure: ANTERIOR CERVICAL DECOMPRESSION FUSION CERVICAL FOUR-CERVICAL FIVE, CERVICAL FIVE-CERVICAL SIX, CERVICAL SIX-CERVICAL SEVEN;  Surgeon: Consuella Lose, MD;  Location: [place];  Service: Neurosurgery;  Laterality: N/A;   BIOPSY  06/28/2020   Procedure: BIOPSY;  Surgeon: Rush Landmark Telford Nab., MD;  Location: [facility];  Service: Gastroenterology;;   CHOLECYSTECTOMY N/A 12/30/2020   Procedure: LAPAROSCOPIC CHOLECYSTECTOMY;  Surgeon: Ileana Roup, MD;  Location: [facility];  Service: General;  Laterality: N/A;   elbow surgery Right    ESOPHAGOGASTRODUODENOSCOPY (EGD) WITH PROPOFOL N/A 06/28/2020   Procedure: ESOPHAGOGASTRODUODENOSCOPY (EGD) WITH PROPOFOL;  Surgeon: Irving Copas., MD;  Location: [facility];  Service: Gastroenterology;  Laterality: N/A;   ESOPHAGOGASTRODUODENOSCOPY (EGD) WITH PROPOFOL N/A 08/05/2020   Procedure: ESOPHAGOGASTRODUODENOSCOPY (EGD) WITH PROPOFOL;  Surgeon: Rush Landmark Telford Nab., MD;  Location: [place];  Service:  Gastroenterology;  Laterality: N/A;   EUS N/A 06/28/2020   Procedure: UPPER ENDOSCOPIC ULTRASOUND (EUS) LINEAR;  Surgeon: Irving Copas., MD;  Location: [facility];  Service: Gastroenterology;  Laterality: N/A;   EUS N/A 08/05/2020   Procedure: UPPER ENDOSCOPIC ULTRASOUND (EUS) RADIAL;  Surgeon: Irving Copas., MD;  Location: [place];  Service: Gastroenterology;  Laterality: N/A;   FINE NEEDLE ASPIRATION  06/28/2020   Procedure: FINE NEEDLE ASPIRATION (FNA) LINEAR;  Surgeon: Irving Copas., MD;  Location: [facility];  Service: Gastroenterology;;   FINE NEEDLE ASPIRATION  08/05/2020   Procedure: FINE NEEDLE ASPIRATION;  Surgeon: Irving Copas., MD;  Location: [facility];  Service: Gastroenterology;;   POLYPECTOMY  06/28/2020   Procedure: POLYPECTOMY;  Surgeon: Irving Copas., MD;  Location: [facility];  Service: Gastroenterology;;   SHOULDER SURGERY Bilateral    inpingement release   TOTAL ABDOMINAL HYSTERECTOMY  2015   ULNAR NERVE REPAIR Right    Family History  Problem Relation Age of Onset   Hypertension Mother    Thyroid disease Mother        hypothyroidism   Asthma Father    Heart disease Father        from scarlet fever; died age 61 MVA   Hypertension Sister    Hypothyroidism Sister    Other Maternal Aunt        fatty liver   Hypertension Maternal Grandmother    Heart failure Maternal Grandmother    Diabetes Paternal Grandmother    Heart failure Paternal Grandmother    Thyroid cancer Cousin    Colon cancer Neg Hx    Esophageal cancer Neg Hx    Pancreatic cancer Neg Hx    Stomach cancer Neg Hx    Breast cancer Neg Hx    Social History   Tobacco Use   Smoking status: Every Day    Packs/day: 0.75    Years: 25.00    Total pack years: 18.75    Types: Cigarettes    Start date: 03/12/1971   Smokeless tobacco: Never  Vaping Use   Vaping Use: Never used  Substance Use Topics   Alcohol use: Not Currently   Drug  use: Never   Current Outpatient Medications  Medication Sig Dispense Refill   acetaminophen (TYLENOL) 325 MG tablet Take 2 tablets (650 mg total) by mouth every 6 (six) hours. (Patient taking differently: Take 650 mg by mouth every 6 (six) hours as needed for mild pain.)     albuterol (VENTOLIN HFA) 108 (90 Base) MCG/ACT inhaler Inhale into the lungs every 6 (six) hours as needed for wheezing or shortness of breath.     aspirin EC 81 MG tablet Take 1 tablet (81 mg total) by mouth at bedtime. 30 tablet 11   azelastine (ASTELIN) 0.1 % nasal spray Place 1 spray into both nostrils at bedtime. Use in each nostril as directed     Cholecalciferol (VITAMIN D) 50 MCG (2000 UT) CAPS Take 2,000 Units by mouth in the morning.     diltiazem (CARDIZEM) 30 MG tablet Take 1 tablet every 4 hours AS NEEDED for heart rate >100 as long as top blood pressure >100. (Patient taking differently: Take 30 mg by mouth as directed. Take 1 tablet (7m)  every 4 hours AS NEEDED for heart rate >100 as long as top blood pressure >100.) 45 tablet 1   diphenhydrAMINE (BENADRYL) 25 MG tablet Take 100 mg by mouth at bedtime.     fexofenadine (ALLEGRA) 180 MG tablet Take 180 mg by mouth in the morning.     fluticasone-salmeterol (ADVAIR HFA) 230-21 MCG/ACT inhaler Inhale 2 puffs into the lungs as needed.     gabapentin (NEURONTIN) 300 MG capsule Take 600 mg by mouth at bedtime.     Golimumab (SIMPONI ARIA IV) Inject 1 Dose into the vein as directed. Every 2 months     lubiprostone (AMITIZA) 24 MCG capsule Take 1 capsule (24 mcg total) by mouth 2 (two) times daily with a meal. Please keep your appointment on 9-6 for further refills. Thank you 60 capsule 1   methocarbamol (ROBAXIN-750) 750 MG tablet Take 1 tablet (750  mg total) by mouth 3 (three) times daily as needed for muscle spasms. 90 tablet 1   omeprazole (PRILOSEC) 40 MG capsule Take 1 capsule (40 mg total) by mouth 2 (two) times daily as needed. 180 capsule 3   polyethylene  glycol (MIRALAX) 17 g packet Take 17 g by mouth daily as needed. (Patient taking differently: Take 17 g by mouth daily.) 14 each 0   RESTASIS 0.05 % ophthalmic emulsion Place 1 drop into both eyes 2 (two) times daily.     sodium chloride (OCEAN) 0.65 % SOLN nasal spray Place 1 spray into both nostrils as needed for congestion.     spironolactone (ALDACTONE) 50 MG tablet 1 tablet     SYNTHROID 125 MCG tablet Take 125 mcg by mouth daily before breakfast.     traZODone (DESYREL) 100 MG tablet Take 300 mg by mouth at bedtime.     b complex vitamins capsule Take 1 capsule by mouth daily. (Patient not taking: Reported on 01/18/2022)     Cannabidiol POWD Apply 1 application topically as needed (pain). CBD Oil/CBD Cream (Patient not taking: Reported on 01/18/2022)     oxyCODONE-acetaminophen (PERCOCET) 7.5-325 MG tablet Take 1 tablet by mouth every 4 (four) hours as needed for severe pain. 60 tablet 0   promethazine (PHENERGAN) 12.5 MG tablet Take 12.5 mg by mouth 3 (three) times daily as needed for nausea/vomiting. (Patient not taking: Reported on 01/18/2022)     spironolactone (ALDACTONE) 100 MG tablet Take 100 mg by mouth in the morning. (Patient not taking: Reported on 10/05/2021)     Tenapanor HCl (IBSRELA) 50 MG TABS Take 50 mg by mouth 2 (two) times daily before a meal. 60 tablet 1   No current facility-administered medications for this visit.   No Known Allergies   Review of Systems: All systems reviewed and negative except where noted in HPI.    ECHOCARDIOGRAM COMPLETE  Result Date: 01/09/2022    ECHOCARDIOGRAM REPORT   Patient Name:   Stephanie Ewing Date of Exam: 01/09/2022 Medical Rec #:  [id number]        Height:       67.0 in Accession #:    [id number]       Weight:       171.6 lb Date of Birth:  02/26/1956       BSA:          1.895 m Patient Age:    36 years         BP:           122/80 mmHg Patient Gender: F                HR:           70 bpm. Exam Location:  [place] Procedure: 2D Echo,  Cardiac Doppler and Color Doppler Indications:    I34.0 Nonrheumatic mitral (valve) insufficiency; I35.1                 Nonrheumatic aortic (valve) insufficiency  History:        Patient has prior history of Echocardiogram examinations, most                 recent 10/29/2019. Aortic Valve Disease, Arrythmias:PVC and                 Atrial Fibrillation; Risk Factors:Current Smoker and                 Hypertension.  Sonographer:  [facility] McFatter RDMS, RVT, RDCS Referring Phys: [id number] [place]  1. Left ventricular ejection fraction, by estimation, is 60 to 65%. The left ventricle has normal function. The left ventricle has no regional wall motion abnormalities. Left ventricular diastolic parameters are indeterminate. The average left ventricular global longitudinal strain is -18.6 %. The global longitudinal strain is normal.  2. Right ventricular systolic function is normal. The right ventricular size is normal.  3. The mitral valve is abnormal. Mild mitral valve regurgitation. No evidence of mitral stenosis.  4. The aortic valve is tricuspid. There is mild calcification of the aortic valve. There is mild thickening of the aortic valve. Aortic valve regurgitation is mild.  5. The inferior vena cava is normal in size with greater than 50% respiratory variability, suggesting right atrial pressure of 3 mmHg. Comparison(s): Echocardiogram done 10/29/19 showed an EF of 60-65%. FINDINGS  Left Ventricle: Left ventricular ejection fraction, by estimation, is 60 to 65%. The left ventricle has normal function. The left ventricle has no regional wall motion abnormalities. The average left ventricular global longitudinal strain is -18.6 %. The global longitudinal strain is normal. The left ventricular internal cavity size was normal in size. There is no left ventricular hypertrophy. Left ventricular diastolic parameters are indeterminate. Right Ventricle: The right ventricular size is normal. Right  vetricular wall thickness was not well visualized. Right ventricular systolic function is normal. Left Atrium: Left atrial size was normal in size. Right Atrium: Right atrial size was normal in size. Pericardium: There is no evidence of pericardial effusion. Mitral Valve: The mitral valve is abnormal. Mild mitral valve regurgitation. No evidence of mitral valve stenosis. Tricuspid Valve: The tricuspid valve is normal in structure. Tricuspid valve regurgitation is not demonstrated. No evidence of tricuspid stenosis. Aortic Valve: The aortic valve is tricuspid. There is mild calcification of the aortic valve. There is mild thickening of the aortic valve. There is mild aortic valve annular calcification. Aortic valve regurgitation is mild. Aortic regurgitation PHT measures 512 msec. Aortic valve mean gradient measures 5.7 mmHg. Aortic valve peak gradient measures 10.1 mmHg. Aortic valve area, by VTI measures 1.37 cm. Pulmonic Valve: The pulmonic valve was not well visualized. Pulmonic valve regurgitation is not visualized. No evidence of pulmonic stenosis. Aorta: The aortic root is normal in size and structure. Venous: The inferior vena cava is normal in size with greater than 50% respiratory variability, suggesting right atrial pressure of 3 mmHg. IAS/Shunts: No atrial level shunt detected by color flow Doppler.  LEFT VENTRICLE PLAX 2D LVIDd:         4.22 cm     Diastology LVIDs:         2.63 cm     LV e' medial:    5.55 cm/s LV PW:         1.02 cm     LV E/e' medial:  15.7 LV IVS:        0.96 cm     LV e' lateral:   6.96 cm/s LVOT diam:     1.70 cm     LV E/e' lateral: 12.6 LV SV:         51 LV SV Index:   27          2D Longitudinal Strain LVOT Area:     2.27 cm    2D Strain GLS (A2C):   -19.6 %  2D Strain GLS (A3C):   -19.1 %                            2D Strain GLS (A4C):   -17.1 % LV Volumes (MOD)           2D Strain GLS Avg:     -18.6 % LV vol d, MOD A2C: 58.6 ml LV vol d, MOD A4C:  75.0 ml LV vol s, MOD A2C: 17.7 ml LV vol s, MOD A4C: 18.8 ml LV SV MOD A2C:     40.9 ml LV SV MOD A4C:     75.0 ml LV SV MOD BP:      49.4 ml RIGHT VENTRICLE RV S prime:     10.00 cm/s TAPSE (M-mode): 2.6 cm LEFT ATRIUM             Index        RIGHT ATRIUM          Index LA diam:        3.20 cm 1.69 cm/m   RA Area:     9.73 cm LA Vol (A2C):   36.9 ml 19.47 ml/m  RA Volume:   20.70 ml 10.92 ml/m LA Vol (A4C):   39.6 ml 20.90 ml/m LA Biplane Vol: 40.1 ml 21.16 ml/m  AORTIC VALVE AV Area (Vmax):    1.29 cm AV Area (Vmean):   1.22 cm AV Area (VTI):     1.37 cm AV Vmax:           158.99 cm/s AV Vmean:          112.182 cm/s AV VTI:            0.370 m AV Peak Grad:      10.1 mmHg AV Mean Grad:      5.7 mmHg LVOT Vmax:         90.10 cm/s LVOT Vmean:        60.300 cm/s LVOT VTI:          0.223 m LVOT/AV VTI ratio: 0.60 AI PHT:            512 msec AR Vena Contracta: 0.18 cm  AORTA Ao Root diam: 2.60 cm Ao Asc diam:  3.20 cm MITRAL VALVE MV Area (PHT): 3.08 cm    SHUNTS MV Decel Time: 246 msec    Systemic VTI:  0.22 m MR Peak grad: 26.8 mmHg    Systemic Diam: 1.70 cm MR Vmax:      259.00 cm/s MV E velocity: 87.40 cm/s MV A velocity: 93.00 cm/s MV E/A ratio:  0.94 Carlyle Dolly MD Electronically signed by Carlyle Dolly MD Signature Date/Time: 01/09/2022/7:18:46 PM    Final     Lab Results  Component Value Date   WBC 5.5 12/30/2020   HGB 15.9 (H) 12/30/2020   HCT 48.3 (H) 12/30/2020   MCV 91.7 12/30/2020   PLT 222 12/30/2020    Lab Results  Component Value Date   CREATININE 1.27 (H) 12/30/2020   BUN 24 (H) 12/30/2020   NA 140 12/30/2020   K 4.1 12/30/2020   CL 105 12/30/2020   CO2 27 12/30/2020    Lab Results  Component Value Date   ALT 38 (H) 09/15/2021   AST 19 09/15/2021   ALKPHOS 68 09/15/2021   BILITOT 0.4 09/15/2021     Physical Exam: BP 118/74   Pulse 79   Ht _0  (1.702 m)  Wt 171 lb 3.2 oz (77.7 kg)   LMP  (LMP Unknown)   SpO2 95%   BMI 26.81 kg/m  Constitutional:  Pleasant,well-developed, female in no acute distress. Neurological: Alert and oriented to person place and time. Psychiatric: Normal mood and affect. Behavior is normal.   ASSESSMENT: 66 y.o. female here for assessment of the following  1. Gastroesophageal reflux disease, unspecified whether esophagitis present   2. Long-term current use of proton pump inhibitor therapy   3. Duodenal adenoma   4. Abnormal magnetic resonance cholangiopancreatography (MRCP)   5. Fatty liver   6. Constipation, unspecified constipation type   7. History of colon polyps    Multiple issues as outlined above.  She is having fairly significant reflux symptoms that are most bothersome at night, really affecting her sleep.  On higher dose PPI which has helped but not resolved it.  We did discuss long-term risks of chronic PPI use, she understands this and wants to continue the dosing given her poorly controlled symptoms.  We discussed options.  She does have a hiatal hernia that is small, perhaps she may be a candidate for TIF if she wants to come off PPI or failing PPI.  She does have a history of a duodenal adenoma, due for surveillance EGD for that, this will also reassess her esophagus.  She wants to proceed with EGD after discussion of risks and benefits.  In the interim I offered to start her on some baclofen 10 mg nightly to help treat nocturnal reflux, discussed risks of that she is willing to do this.  She we will hold off on taking her Robaxin during this time.  Otherwise reviewed her pancreatitis history, she has done well status post cholecystectomy without recurrence.  We will do a surveillance MRCP in March 2024 given findings on her most recent exam.  I counseled her on fatty liver disease, her weight is near baseline and she is not obese.  She will continue to avoid alcohol, check LFTs yearly.  She is in need of hepatitis A and hepatitis B vaccine.  She states she has had hepatitis B vaccine multiple times  in the past and it did not work, she declined that.  She is willing to do hepatitis A vaccine today.  Reviewed her constipation at length, DRE did not suggest pelvic floor dysfunction.  She has failed multiple oral regimens but did do quite well on Ibsrela.  I will give her more free samples of this today but we will try to appeal with her insurance company for coverage as she has failed numerous alternatives to date.  She is due for surveillance colonoscopy at the end of this year for history of piecemeal large polyps removed on her last exam.  She is agreeable to proceed at that time.   PLAN: - EGD at the [facility] next Thusday - continue omeprazole 5m BID - add baclofen 152mq HS for nocturnal reflux (stop robaxin) - consider TIF or surgery pending her course if symptoms persist despite aggressive medical therapy - repeat MRCP March 2024, now s/p chole - counseled on fatty liver, LFTs yearly - hep A vaccine today - IBSRela samples - appeal to insurance for coverage given failure of other options - surveillance colonoscopy November / December  StJolly MangoMD [facility]

## 2022-01-23 ENCOUNTER — Encounter: Payer: Self-pay | Admitting: Gastroenterology

## 2022-01-23 DIAGNOSIS — K5900 Constipation, unspecified: Secondary | ICD-10-CM

## 2022-01-23 MED ORDER — IBSRELA 50 MG PO TABS: 50.0000 mg | ORAL_TABLET | Freq: Two times a day (BID) | ORAL | 2 refills | Status: DC

## 2022-01-23 NOTE — Telephone Encounter (Signed)
IBSRELA prescription printed and faxed to [facility] at [phone]

## 2022-01-24 NOTE — Telephone Encounter (Signed)
Received a fax from [facility] with pharmacy prescription form. Form was completed and faxed back to [facility] along with pt's current medications and problem history.

## 2022-01-26 ENCOUNTER — Encounter: Payer: Self-pay | Admitting: Gastroenterology

## 2022-01-26 ENCOUNTER — Ambulatory Visit (AMBULATORY_SURGERY_CENTER): Payer: Medicare Other | Admitting: Gastroenterology

## 2022-01-26 VITALS — BP 130/74 | HR 59 | Temp 96.0°F | Resp 16 | Ht 67.0 in | Wt 171.0 lb

## 2022-01-26 DIAGNOSIS — K219 Gastro-esophageal reflux disease without esophagitis: Secondary | ICD-10-CM | POA: Diagnosis not present

## 2022-01-26 DIAGNOSIS — K449 Diaphragmatic hernia without obstruction or gangrene: Secondary | ICD-10-CM | POA: Diagnosis not present

## 2022-01-26 DIAGNOSIS — D132 Benign neoplasm of duodenum: Secondary | ICD-10-CM | POA: Diagnosis not present

## 2022-01-26 DIAGNOSIS — K2980 Duodenitis without bleeding: Secondary | ICD-10-CM

## 2022-01-26 MED ORDER — SODIUM CHLORIDE 0.9 % IV SOLN: 500.0000 mL | Freq: Once | INTRAVENOUS | Status: DC

## 2022-01-26 NOTE — Progress Notes (Signed)
To pacu, VSS. Report to [email or web address] 

## 2022-01-26 NOTE — Progress Notes (Signed)
History and Physical Interval Note: EGD today for issues below. On BID PPI, baclofen added recently which has helped. No interval changes from recent office visit. She is feeling well today, no cardiopulmonary symptoms.  01/26/2022 4:10 PM  Stephanie Ewing  has presented today for endoscopic procedure(s), with the diagnosis of  Encounter Diagnoses  Name Primary?   Duodenal adenoma Yes   Gastroesophageal reflux disease, unspecified whether esophagitis present   .  The various methods of evaluation and treatment have been discussed with the patient and/or family. After consideration of risks, benefits and other options for treatment, the patient has consented to  the endoscopic procedure(s).   The patient's history has been reviewed, patient examined, no change in status, stable for surgery.  I have reviewed the patient's chart and labs.  Questions were answered to the patient's satisfaction.    Jolly Mango, MD [facility]

## 2022-01-26 NOTE — Telephone Encounter (Signed)
Called [employer] assist and spoke with [facility]. I was informed that they needed a diagnoses code (although this was on the original prescription form that was faxed to them on 9/12). I provided Chelsea with the code for constipation. Chelsea will update patient's application and inform patient's case Freight forwarder. Patient notified via [place].

## 2022-01-26 NOTE — Op Note (Signed)
[place] Patient Name: Stephanie Ewing Procedure Date: 01/26/2022 4:04 PM MRN: [id number] Endoscopist: Remo Lipps P. Tarissa Kerin , MD Age: 66 Referring MD:  Date of Birth: 06/16/1955 Gender: Female Account #: [id number] Procedure:                Upper GI endoscopy Indications:              persistent gastro-esophageal reflux disease despite                            omeprazole '40mg'$  twice daily - recently added                            baclofen for nocturnal symptoms which has helped,                            Follow-up of adenoma in the duodenum removed 06/2020 Medicines:                Monitored Anesthesia Care Procedure:                Pre-Anesthesia Assessment:                           - Prior to the procedure, a History and Physical                            was performed, and patient medications and                            allergies were reviewed. The patient's tolerance of                            previous anesthesia was also reviewed. The risks                            and benefits of the procedure and the sedation                            options and risks were discussed with the patient.                            All questions were answered, and informed consent                            was obtained. Prior Anticoagulants: The patient has                            taken no previous anticoagulant or antiplatelet                            agents. ASA Grade Assessment: II - A patient with                            mild systemic disease. After reviewing the risks  and benefits, the patient was deemed in                            satisfactory condition to undergo the procedure.                           After obtaining informed consent, the endoscope was                            passed under direct vision. Throughout the                            procedure, the patient's blood pressure, pulse, and                             oxygen saturations were monitored continuously. The                            Endoscope was introduced through the mouth, and                            advanced to the second part of duodenum. The upper                            GI endoscopy was accomplished without difficulty.                            The patient tolerated the procedure well. Scope In: Scope Out: Findings:                 Esophagogastric landmarks were identified: the                            Z-line was found at 38 cm, the gastroesophageal                            junction was found at 38 cm and the upper extent of                            the gastric folds was found at 39 cm from the                            incisors.                           A 1 cm hiatal hernia was present.                           The gastroesophageal flap valve was visualized                            endoscopically and classified as Hill Grade II                            (  fold present, opens with respiration).                           The exam of the esophagus was otherwise normal. No                            Barrett's                           Biopsies were taken with a cold forceps in the                            middle third of the esophagus and in the lower                            third of the esophagus for histology to rule out                            EoE, assess for reflux changes, patient is                            considering TIF.                           The entire examined stomach was normal.                           A single diminutive sessile polyp was found in the                            second portion of the duodenum. The polyp was                            removed with a cold biopsy forceps. Resection and                            retrieval were complete.                           The exam of the duodenum was otherwise normal. Complications:            No immediate complications. Estimated  blood loss:                            Minimal. Estimated Blood Loss:     Estimated blood loss was minimal. Impression:               - Esophagogastric landmarks identified.                           - 1 cm hiatal hernia.                           - Gastroesophageal flap valve classified as Hill  Grade II (fold present, opens with respiration).                           - Normal esophagus otherwise - biopsies obtained.                           - Normal stomach.                           - A single duodenal polyp. Resected and retrieved.                           - Normal duodenum otherwise. Recommendation:           - Patient has a contact number available for                            emergencies. The signs and symptoms of potential                            delayed complications were discussed with the                            patient. Return to normal activities tomorrow.                            Written discharge instructions were provided to the                            patient.                           - Resume previous diet.                           - Continue present medications - omeprazole twice                            daily and baclofen q HS                           - Await pathology results.                           - I believe patient is a candidate for TIF if she                            has continued poor control of symptoms despite                            twice daily PPI or if she wanted to come off                            medications. I will discuss options with her Stephanie Ewing. Stephanie Mayfield, MD 01/26/2022 4:30:00 PM This report has been signed electronically.

## 2022-01-26 NOTE — Progress Notes (Signed)
Called to room to assist during endoscopic procedure.  Patient ID and intended procedure confirmed with present staff. Received instructions for my participation in the procedure from the performing physician.  

## 2022-01-26 NOTE — Patient Instructions (Signed)
Await pathology results.   Continue present medications.  Handout on GERD/Hiatal Hernia provided.  YOU HAD AN ENDOSCOPIC PROCEDURE TODAY AT [facility]:   Refer to the procedure report that was given to you for any specific questions about what was found during the examination.  If the procedure report does not answer your questions, please call your gastroenterologist to clarify.  If you requested that your care partner not be given the details of your procedure findings, then the procedure report has been included in a sealed envelope for you to review at your convenience later.  YOU SHOULD EXPECT: Some feelings of bloating in the abdomen. Passage of more gas than usual.  Walking can help get rid of the air that was put into your GI tract during the procedure and reduce the bloating. If you had a lower endoscopy (such as a colonoscopy or flexible sigmoidoscopy) you may notice spotting of blood in your stool or on the toilet paper. If you underwent a bowel prep for your procedure, you may not have a normal bowel movement for a few days.  Please Note:  You might notice some irritation and congestion in your nose or some drainage.  This is from the oxygen used during your procedure.  There is no need for concern and it should clear up in a day or so.  SYMPTOMS TO REPORT IMMEDIATELY:   Following upper endoscopy (EGD)  Vomiting of blood or coffee ground material  New chest pain or pain under the shoulder blades  Painful or persistently difficult swallowing  New shortness of breath  Fever of 100F or higher  Black, tarry-looking stools  For urgent or emergent issues, a gastroenterologist can be reached at any hour by calling [phone]. Do not use MyChart messaging for urgent concerns.    DIET:  We do recommend a small meal at first, but then you may proceed to your regular diet.  Drink plenty of fluids but you should avoid alcoholic beverages for 24 hours.  ACTIVITY:   You should plan to take it easy for the rest of today and you should NOT DRIVE or use heavy machinery until tomorrow (because of the sedation medicines used during the test).    FOLLOW UP: Our staff will call the number listed on your records the next business day following your procedure.  We will call around 7:15- 8:00 am to check on you and address any questions or concerns that you may have regarding the information given to you following your procedure. If we do not reach you, we will leave a message.     If any biopsies were taken you will be contacted by phone or by letter within the next 1-3 weeks.  Please call us at [phone] if you have not heard about the biopsies in 3 weeks.    SIGNATURES/CONFIDENTIALITY: You and/or your care partner have signed paperwork which will be entered into your electronic medical record.  These signatures attest to the fact that that the information above on your After Visit Summary has been reviewed and is understood.  Full responsibility of the confidentiality of this discharge information lies with you and/or your care-partner.

## 2022-01-27 ENCOUNTER — Telehealth: Payer: Self-pay

## 2022-01-27 NOTE — Telephone Encounter (Signed)
No answer, left message to call if having any issues or concerns, B.Donoven Pett RN 

## 2022-03-07 ENCOUNTER — Telehealth: Payer: Self-pay | Admitting: Gastroenterology

## 2022-03-07 ENCOUNTER — Encounter: Payer: Self-pay | Admitting: Gastroenterology

## 2022-03-07 NOTE — Telephone Encounter (Signed)
Patient called states she would like her colonoscopy appointment before next year. Requesting a call back as soon as possible.

## 2022-03-07 NOTE — Telephone Encounter (Signed)
Patient is schedule on 12/5 and pv 11/7

## 2022-03-07 NOTE — Telephone Encounter (Signed)
Stephanie Ewing, patient can be scheduled. Based on last colonoscopy results he wanted her to repeat colon between 6-12 months. Please schedule for December. Thanks

## 2022-03-13 ENCOUNTER — Encounter: Payer: Self-pay | Admitting: Gastroenterology

## 2022-03-13 ENCOUNTER — Other Ambulatory Visit: Payer: Self-pay

## 2022-03-13 MED ORDER — BACLOFEN 10 MG PO TABS: 10.0000 mg | ORAL_TABLET | Freq: Every day | ORAL | 1 refills | Status: DC

## 2022-03-13 NOTE — Progress Notes (Signed)
Refill baclofen qhs

## 2022-03-21 ENCOUNTER — Ambulatory Visit (AMBULATORY_SURGERY_CENTER): Payer: Self-pay

## 2022-03-21 VITALS — Ht 67.0 in | Wt 175.0 lb

## 2022-03-21 DIAGNOSIS — D132 Benign neoplasm of duodenum: Secondary | ICD-10-CM

## 2022-03-21 NOTE — Progress Notes (Signed)

## 2022-04-18 ENCOUNTER — Encounter: Payer: Medicare Other | Admitting: Gastroenterology

## 2022-04-25 ENCOUNTER — Telehealth: Payer: Self-pay

## 2022-04-25 NOTE — Telephone Encounter (Signed)
Rec'd fax from [facility] requesting new script for IBSRELA 50 mg BID before meals.  Form completed, stamped with Dr. Doyne Keel signature, and faxed back to pharmacy. Confirmation received.

## 2022-05-04 ENCOUNTER — Ambulatory Visit (AMBULATORY_SURGERY_CENTER): Payer: Medicare Other | Admitting: Gastroenterology

## 2022-05-04 ENCOUNTER — Encounter: Payer: Self-pay | Admitting: Gastroenterology

## 2022-05-04 VITALS — BP 138/92 | HR 62 | Temp 98.6°F | Resp 11 | Ht 67.0 in | Wt 175.0 lb

## 2022-05-04 DIAGNOSIS — Z09 Encounter for follow-up examination after completed treatment for conditions other than malignant neoplasm: Secondary | ICD-10-CM

## 2022-05-04 DIAGNOSIS — K635 Polyp of colon: Secondary | ICD-10-CM | POA: Diagnosis not present

## 2022-05-04 DIAGNOSIS — D128 Benign neoplasm of rectum: Secondary | ICD-10-CM

## 2022-05-04 DIAGNOSIS — K621 Rectal polyp: Secondary | ICD-10-CM | POA: Diagnosis not present

## 2022-05-04 DIAGNOSIS — Z86010 Personal history of colonic polyps: Secondary | ICD-10-CM

## 2022-05-04 MED ORDER — SODIUM CHLORIDE 0.9 % IV SOLN: 500.0000 mL | Freq: Once | INTRAVENOUS | Status: DC

## 2022-05-04 NOTE — Progress Notes (Signed)
Pt's states no medical or surgical changes since previsit or office visit. 

## 2022-05-04 NOTE — Progress Notes (Signed)
[facility] History and Physical   Primary Care Physician:  Monico Blitz, MD   Reason for Procedure:   History of colon polyps  Plan:    colonoscopy     HPI: Stephanie Ewing is a 66 y.o. female  here for colonoscopy surveillance - advanced cecal polyp removed 09/2021, here for close follow up surveillance to ensure no residual polyp. She has chronic constipation.   Otherwise feels well without any cardiopulmonary symptoms.   I have discussed risks / benefits of anesthesia and endoscopic procedure with Stephanie Ewing and they wish to proceed with the exams as outlined today.    Past Medical History:  Diagnosis Date   Afib ([place])    Arthritis    psoratic   Asthma    "weak"    Constipation    Depression    Dysrhythmia    AFIb   GERD (gastroesophageal reflux disease)    History of hiatal hernia    Hyperaldosteronism (H[facility])    Hypertension    Hypokalemia    Hypothyroidism    Insomnia    Migraine    ocassional   Nicotine dependence, cigarettes, uncomplicated    Pancreatitis    PONV (postoperative nausea and vomiting)    Pre-diabetes    Thyroid disease    hypothyroid    Past Surgical History:  Procedure Laterality Date   ANTERIOR CERVICAL DECOMPRESSION/DISCECTOMY FUSION 4 LEVELS N/A 07/20/2020   Procedure: ANTERIOR CERVICAL DECOMPRESSION FUSION CERVICAL FOUR-CERVICAL FIVE, CERVICAL FIVE-CERVICAL SIX, CERVICAL SIX-CERVICAL SEVEN;  Surgeon: Consuella Lose, MD;  Location: [facility];  Service: Neurosurgery;  Laterality: N/A;   BIOPSY  06/28/2020   Procedure: BIOPSY;  Surgeon: Rush Landmark Telford Nab., MD;  Location: [facility];  Service: Gastroenterology;;   CHOLECYSTECTOMY N/A 12/30/2020   Procedure: LAPAROSCOPIC CHOLECYSTECTOMY;  Surgeon: Ileana Roup, MD;  Location: [facility];  Service: General;  Laterality: N/A;   elbow surgery Right    ESOPHAGOGASTRODUODENOSCOPY (EGD) WITH PROPOFOL N/A 06/28/2020   Procedure: ESOPHAGOGASTRODUODENOSCOPY (EGD) WITH  PROPOFOL;  Surgeon: Irving Copas., MD;  Location: [facility];  Service: Gastroenterology;  Laterality: N/A;   ESOPHAGOGASTRODUODENOSCOPY (EGD) WITH PROPOFOL N/A 08/05/2020   Procedure: ESOPHAGOGASTRODUODENOSCOPY (EGD) WITH PROPOFOL;  Surgeon: Rush Landmark Telford Nab., MD;  Location: [facility];  Service: Gastroenterology;  Laterality: N/A;   EUS N/A 06/28/2020   Procedure: UPPER ENDOSCOPIC ULTRASOUND (EUS) LINEAR;  Surgeon: Irving Copas., MD;  Location: [facility];  Service: Gastroenterology;  Laterality: N/A;   EUS N/A 08/05/2020   Procedure: UPPER ENDOSCOPIC ULTRASOUND (EUS) RADIAL;  Surgeon: Irving Copas., MD;  Location: [place] [facility];  Service: Gastroenterology;  Laterality: N/A;   FINE NEEDLE ASPIRATION  06/28/2020   Procedure: FINE NEEDLE ASPIRATION (FNA) LINEAR;  Surgeon: Irving Copas., MD;  Location: [facility];  Service: Gastroenterology;;   FINE NEEDLE ASPIRATION  08/05/2020   Procedure: FINE NEEDLE ASPIRATION;  Surgeon: Irving Copas., MD;  Location: [facility];  Service: Gastroenterology;;   POLYPECTOMY  06/28/2020   Procedure: POLYPECTOMY;  Surgeon: Irving Copas., MD;  Location: [facility];  Service: Gastroenterology;;   SHOULDER SURGERY Bilateral    inpingement release   TOTAL ABDOMINAL HYSTERECTOMY  2015   ULNAR NERVE REPAIR Right     Prior to Admission medications   Medication Sig Start Date End Date Taking? Authorizing Provider  aspirin EC 81 MG tablet Take 1 tablet (81 mg total) by mouth at bedtime. 07/27/20  Yes Costella, Vista Mink, PA-C  baclofen (LIORESAL) 10 MG tablet Take 1 tablet (  10 mg total) by mouth at bedtime. 03/13/22  Yes Makaylyn Sinyard, [name], MD  Cholecalciferol (VITAMIN D) 50 MCG (2000 UT) CAPS Take 2,000 Units by mouth in the morning.   Yes [provider]  citalopram (CELEXA) 20 MG tablet Take 20 mg by mouth daily. 04/17/22  Yes [provider]  diphenhydrAMINE (BENADRYL) 25  MG tablet Take 100 mg by mouth at bedtime.   Yes [provider]  fexofenadine (ALLEGRA) 180 MG tablet Take 180 mg by mouth in the morning.   Yes [provider]  gabapentin (NEURONTIN) 600 MG tablet Take 600 mg by mouth at bedtime. 04/13/22  Yes [provider]  levothyroxine (SYNTHROID) 137 MCG tablet Take 137 mcg by mouth daily before breakfast. 04/29/22  Yes [provider]  montelukast (SINGULAIR) 10 MG tablet Take 10 mg by mouth at bedtime.   Yes [provider]  omeprazole (PRILOSEC) 40 MG capsule Take 1 capsule (40 mg total) by mouth 2 (two) times daily as needed. 07/05/21  Yes Latessa Tillis, [name], MD  pramipexole (MIRAPEX) 0.5 MG tablet Take 0.5 mg by mouth at bedtime. 02/14/22  Yes [provider]  RESTASIS 0.05 % ophthalmic emulsion Place 1 drop into both eyes 2 (two) times daily. 07/29/19  Yes [provider]  spironolactone (ALDACTONE) 50 MG tablet 1 tablet 09/09/21  Yes [provider]  Tenapanor HCl (IBSRELA) 50 MG TABS Take 50 mg by mouth 2 (two) times daily before a meal. 01/23/22  Yes Talal Fritchman, [name], MD  trazodone (DESYREL) 300 MG tablet Take 300 mg by mouth at bedtime. 04/18/22  Yes [provider]  acetaminophen (TYLENOL) 325 MG tablet Take 2 tablets (650 mg total) by mouth every 6 (six) hours. Patient not taking: Reported on 01/26/2022 04/15/20   [name], MD  albuterol (VENTOLIN HFA) 108 [phone] Base) MCG/ACT inhaler Inhale into the lungs every 6 (six) hours as needed for wheezing or shortness of breath. Patient not taking: Reported on 03/21/2022    [provider]  azelastine (ASTELIN) 0.1 % nasal spray Place 1 spray into both nostrils at bedtime. Use in each nostril as directed Patient not taking: Reported on 03/21/2022    [provider]  diltiazem (CARDIZEM) 30 MG tablet Take 1 tablet every 4 hours AS NEEDED for heart rate >100 as long as top blood pressure >100. Patient not  taking: Reported on 01/26/2022 09/19/19   Sherran Needs, NP  fluticasone-salmeterol (ADVAIR HFA) [phone] MCG/ACT inhaler Inhale 2 puffs into the lungs as needed. Patient not taking: Reported on 03/21/2022    [provider]  Golimumab ([place] ARIA IV) Inject 1 Dose into the vein as directed. Every 2 months Patient not taking: Reported on 03/21/2022    [provider]  HYDROMET 5-1.5 MG/5ML syrup Take 5 mLs by mouth 3 (three) times daily as needed. 03/31/22   [provider]  meloxicam (MOBIC) 15 MG tablet Take 15 mg by mouth daily. 04/17/22   [provider]  polyethylene glycol (MIRALAX) 17 g packet Take 17 g by mouth daily as needed. Patient not taking: Reported on 03/21/2022 06/10/20   Yetta Flock, MD  sodium chloride (OCEAN) 0.65 % SOLN nasal spray Place 1 spray into both nostrils as needed for congestion. Patient not taking: Reported on 03/21/2022    [provider]  traMADol (ULTRAM) 50 MG tablet 1 tablet as needed Orally Once a day    [provider]    Current Outpatient Medications  Medication Sig Dispense Refill   aspirin EC 81 MG tablet Take 1 tablet (81 mg total) by mouth at bedtime. 30 tablet 11   baclofen (LIORESAL) 10 MG tablet Take 1 tablet (10 mg total) by mouth at bedtime. 30 each 1   Cholecalciferol (VITAMIN D) 50 MCG (2000 UT) CAPS Take 2,000 Units by mouth in the morning.     citalopram (CELEXA) 20 MG tablet Take 20 mg by mouth daily.     diphenhydrAMINE (BENADRYL) 25 MG tablet Take 100 mg by mouth at bedtime.     fexofenadine (ALLEGRA) 180 MG tablet Take 180 mg by mouth in the morning.     gabapentin (NEURONTIN) 600 MG tablet Take 600 mg by mouth at bedtime.     levothyroxine (SYNTHROID) 137 MCG tablet Take 137 mcg by mouth daily before breakfast.     montelukast (SINGULAIR) 10 MG tablet Take 10 mg by mouth at bedtime.     omeprazole (PRILOSEC) 40 MG capsule Take 1 capsule (40 mg total) by mouth 2 (two) times  daily as needed. 180 capsule 3   pramipexole (MIRAPEX) 0.5 MG tablet Take 0.5 mg by mouth at bedtime.     RESTASIS 0.05 % ophthalmic emulsion Place 1 drop into both eyes 2 (two) times daily.     spironolactone (ALDACTONE) 50 MG tablet 1 tablet     Tenapanor HCl (IBSRELA) 50 MG TABS Take 50 mg by mouth 2 (two) times daily before a meal. 60 tablet 2   trazodone (DESYREL) 300 MG tablet Take 300 mg by mouth at bedtime.     acetaminophen (TYLENOL) 325 MG tablet Take 2 tablets (650 mg total) by mouth every 6 (six) hours. (Patient not taking: Reported on 01/26/2022)     albuterol (VENTOLIN HFA) 108 (90 Base) MCG/ACT inhaler Inhale into the lungs every 6 (six) hours as needed for wheezing or shortness of breath. (Patient not taking: Reported on 03/21/2022)     azelastine (ASTELIN) 0.1 % nasal spray Place 1 spray into both nostrils at bedtime. Use in each nostril as directed (Patient not taking: Reported on 03/21/2022)     diltiazem (CARDIZEM) 30 MG tablet Take 1 tablet every 4 hours AS NEEDED for heart rate >100 as long as top blood pressure >100. (Patient not taking: Reported on 01/26/2022) 45 tablet 1   fluticasone-salmeterol (ADVAIR HFA) 230-21 MCG/ACT inhaler Inhale 2 puffs into the lungs as needed. (Patient not taking: Reported on 03/21/2022)     Golimumab ([place] ARIA IV) Inject 1 Dose into the vein as directed. Every 2 months (Patient not taking: Reported on 03/21/2022)     HYDROMET 5-1.5 MG/5ML syrup Take 5 mLs by mouth 3 (three) times daily as needed.     meloxicam (MOBIC) 15 MG tablet Take 15 mg by mouth daily.     polyethylene glycol (MIRALAX) 17 g packet Take 17 g by mouth daily as needed. (Patient not taking: Reported on 03/21/2022) 14 each 0   sodium chloride (OCEAN) 0.65 % SOLN nasal spray Place 1 spray into both nostrils as needed for congestion. (Patient not taking: Reported on 03/21/2022)     traMADol (ULTRAM) 50 MG tablet 1 tablet as needed Orally Once a day     Current Facility-Administered  Medications  Medication Dose Route Frequency Provider Last Rate Last Admin   0.9 %  sodium chloride infusion  500 mL Intravenous Once Jere Vanburen, [name], MD        Allergies as of 05/04/2022   (No Known Allergies)  Family History  Problem Relation Age of Onset   Hypertension Mother    Thyroid disease Mother        hypothyroidism   Asthma Father    Heart disease Father        from scarlet fever; died age 72 MVA   Hypertension Sister    Hypothyroidism Sister    Other Maternal Aunt        fatty liver   Hypertension Maternal Grandmother    Heart failure Maternal Grandmother    Diabetes Paternal Grandmother    Heart failure Paternal Grandmother    Thyroid cancer Cousin    Colon cancer Neg Hx    Esophageal cancer Neg Hx    Pancreatic cancer Neg Hx    Stomach cancer Neg Hx    Breast cancer Neg Hx    Rectal cancer Neg Hx     Social History   Socioeconomic History   Marital status: Married    Spouse name: Not on file   Number of children: Not on file   Years of education: Not on file   Highest education level: Not on file  Occupational History   Not on file  Tobacco Use   Smoking status: Every Day    Packs/day: 0.75    Years: 25.00    Total pack years: 18.75    Types: Cigarettes    Start date: 03/12/1971   Smokeless tobacco: Never  Vaping Use   Vaping Use: Never used  Substance and Sexual Activity   Alcohol use: Not Currently   Drug use: Never   Sexual activity: Not on file  Other Topics Concern   Not on file  Social History Narrative   Not on file   Social Determinants of Health   Financial Resource Strain: Not on file  Food Insecurity: No Food Insecurity (12/20/2021)   Hunger Vital Sign    Worried About Running Out of Food in the Last Year: Never true    Ran Out of Food in the Last Year: Never true  Transportation Needs: No Transportation Needs (12/20/2021)   PRAPARE - Hydrologist (Medical): No    Lack of Transportation  (Non-Medical): No  Physical Activity: Not on file  Stress: Not on file  Social Connections: Not on file  Intimate Partner Violence: Not on file    Review of Systems: All other review of systems negative except as mentioned in the HPI.  Physical Exam: Vital signs BP 138/71   Pulse 70   Temp 98.6 F (37 C) (Temporal)   Ht '5\' 7"'$  (1.702 m)   Wt 175 lb (79.4 kg)   LMP  (LMP Unknown)   SpO2 98%   BMI 27.41 kg/m   General:   Alert,  Well-developed, pleasant and cooperative in NAD Lungs:  Clear throughout to auscultation.   Heart:  Regular rate and rhythm Abdomen:  Soft, nontender and nondistended.   Neuro/Psych:  Alert and cooperative. Normal mood and affect. A and O x 3  Jolly Mango, MD [facility]

## 2022-05-04 NOTE — Patient Instructions (Signed)
Handout provided on polyps and hemorrhoids.   Resume previous diet. Continue present medications.  Await pathology results.  Repeat colonoscopy in 3 years for surveillance.   YOU HAD AN ENDOSCOPIC PROCEDURE TODAY AT [facility]:   Refer to the procedure report that was given to you for any specific questions about what was found during the examination.  If the procedure report does not answer your questions, please call your gastroenterologist to clarify.  If you requested that your care partner not be given the details of your procedure findings, then the procedure report has been included in a sealed envelope for you to review at your convenience later.  YOU SHOULD EXPECT: Some feelings of bloating in the abdomen. Passage of more gas than usual.  Walking can help get rid of the air that was put into your GI tract during the procedure and reduce the bloating. If you had a lower endoscopy (such as a colonoscopy or flexible sigmoidoscopy) you may notice spotting of blood in your stool or on the toilet paper. If you underwent a bowel prep for your procedure, you may not have a normal bowel movement for a few days.  Please Note:  You might notice some irritation and congestion in your nose or some drainage.  This is from the oxygen used during your procedure.  There is no need for concern and it should clear up in a day or so.  SYMPTOMS TO REPORT IMMEDIATELY:  Following lower endoscopy (colonoscopy or flexible sigmoidoscopy):  Excessive amounts of blood in the stool  Significant tenderness or worsening of abdominal pains  Swelling of the abdomen that is new, acute  Fever of 100F or higher  For urgent or emergent issues, a gastroenterologist can be reached at any hour by calling [phone]. Do not use MyChart messaging for urgent concerns.    DIET:  We do recommend a small meal at first, but then you may proceed to your regular diet.  Drink plenty of fluids but you should  avoid alcoholic beverages for 24 hours.  ACTIVITY:  You should plan to take it easy for the rest of today and you should NOT DRIVE or use heavy machinery until tomorrow (because of the sedation medicines used during the test).    FOLLOW UP: Our staff will call the number listed on your records the next business day following your procedure.  We will call around 7:15- 8:00 am to check on you and address any questions or concerns that you may have regarding the information given to you following your procedure. If we do not reach you, we will leave a message.     If any biopsies were taken you will be contacted by phone or by letter within the next 1-3 weeks.  Please call us at [phone] if you have not heard about the biopsies in 3 weeks.    SIGNATURES/CONFIDENTIALITY: You and/or your care partner have signed paperwork which will be entered into your electronic medical record.  These signatures attest to the fact that that the information above on your After Visit Summary has been reviewed and is understood.  Full responsibility of the confidentiality of this discharge information lies with you and/or your care-partner.

## 2022-05-04 NOTE — Progress Notes (Signed)
Called to room to assist during endoscopic procedure.  Patient ID and intended procedure confirmed with present staff. Received instructions for my participation in the procedure from the performing physician.  

## 2022-05-04 NOTE — Progress Notes (Signed)
VSS, transported to PACU °

## 2022-05-04 NOTE — Op Note (Signed)
[facility] Patient Name: Stephanie Ewing Procedure Date: 05/04/2022 11:17 AM MRN: [id number] Endoscopist: Remo Lipps P. Havery Moros , MD, [id number] Age: 66 Referring MD:  Date of Birth: [birth year] Gender: Female Account #: [id number] Procedure:                Colonoscopy Indications:              High risk colon cancer surveillance: Personal                            history of colonic polyps - large cecal polyp                            removed in piecemeal in May 2023 - here for close                            follow up surveillance Medicines:                Monitored Anesthesia Care Procedure:                Pre-Anesthesia Assessment:                           - Prior to the procedure, a History and Physical                            was performed, and patient medications and                            allergies were reviewed. The patient's tolerance of                            previous anesthesia was also reviewed. The risks                            and benefits of the procedure and the sedation                            options and risks were discussed with the patient.                            All questions were answered, and informed consent                            was obtained. Prior Anticoagulants: The patient has                            taken no anticoagulant or antiplatelet agents. ASA                            Grade Assessment: III - A patient with severe                            systemic disease. After reviewing the risks and  benefits, the patient was deemed in satisfactory                            condition to undergo the procedure.                           After obtaining informed consent, the colonoscope                            was passed under direct vision. Throughout the                            procedure, the patient's blood pressure, pulse, and                            oxygen saturations were  monitored continuously. The                            PCF-HQ190L Colonoscope was introduced through the                            anus and advanced to the the terminal ileum, with                            identification of the appendiceal orifice and IC                            valve. The colonoscopy was performed without                            difficulty. The patient tolerated the procedure                            well. The quality of the bowel preparation was                            adequate. The terminal ileum, ileocecal valve,                            appendiceal orifice, and rectum were photographed. Scope In: 11:33:39 AM Scope Out: 12:03:05 PM Scope Withdrawal Time: 0 hours 23 minutes 32 seconds  Total Procedure Duration: 0 hours 29 minutes 26 seconds  Findings:                 Skin tags were found on perianal exam.                           The terminal ileum appeared normal.                           A post polypectomy scar was found in the cecum with                            a residual hemostasis clip. There was no evidence  of the previous polyp. The site was difficult to                            visualize behind the IC valve, forceps used to                            pleat it back for visualization                           A 3 to 4 mm polyp was found in the ascending colon.                            The polyp was flat. The polyp was removed with a                            cold snare. Resection and retrieval were complete.                           A 6 mm polyp was found in the rectum. The polyp was                            flat. The polyp was removed with a cold snare.                            Resection and retrieval were complete.                           Internal hemorrhoids were found during retroflexion.                           The exam was otherwise without abnormality. Complications:            No immediate  complications. Estimated blood loss:                            Minimal. Estimated Blood Loss:     Estimated blood loss was minimal. Impression:               - Perianal skin tags found on perianal exam.                           - The examined portion of the ileum was normal.                           - Post-polypectomy scar in the cecum with residual                            hemostasis clip.                           - One 3 to 4 mm polyp in the ascending colon,                            removed  with a cold snare. Resected and retrieved.                           - One 6 mm polyp in the rectum, removed with a cold                            snare. Resected and retrieved.                           - Internal hemorrhoids.                           - The examination was otherwise normal. Recommendation:           - Patient has a contact number available for                            emergencies. The signs and symptoms of potential                            delayed complications were discussed with the                            patient. Return to normal activities tomorrow.                            Written discharge instructions were provided to the                            patient.                           - Resume previous diet.                           - Continue present medications.                           - Await pathology results.                           - Repeat colonoscopy in 3 years for surveillance Remo Lipps P. Jeris Easterly, MD 05/04/2022 12:11:34 PM This report has been signed electronically.

## 2022-05-05 ENCOUNTER — Telehealth: Payer: Self-pay

## 2022-05-05 NOTE — Telephone Encounter (Signed)
  Follow up Call-     05/04/2022   10:51 AM 01/26/2022    2:50 PM 10/05/2021   10:24 AM  Call back number  Post procedure Call Back phone  # [phone]  Permission to leave phone message Yes Yes Yes     Patient questions:  Do you have a fever, pain , or abdominal swelling? No. Pain Score  0 *  Have you tolerated food without any problems? Yes.    Have you been able to return to your normal activities? Yes.    Do you have any questions about your discharge instructions: Diet   No. Medications  No. Follow up visit  No.  Do you have questions or concerns about your Care? No.  Actions: * If pain score is 4 or above: No action needed, pain <4.

## 2022-05-10 ENCOUNTER — Other Ambulatory Visit: Payer: Self-pay

## 2022-05-10 MED ORDER — BACLOFEN 10 MG PO TABS: 10.0000 mg | ORAL_TABLET | Freq: Every day | ORAL | 1 refills | Status: DC

## 2022-05-10 NOTE — Telephone Encounter (Signed)
Baclofen refilled as pharmacy requested. According to the last encounter with Dr Havery Moros he was to continue his current medicines.

## 2022-06-06 ENCOUNTER — Telehealth: Payer: Self-pay

## 2022-06-06 NOTE — Telephone Encounter (Signed)
IBSRELA approved by [employer] Patient Asst 06-06-22 THROUGH 05-15-23

## 2022-06-22 ENCOUNTER — Other Ambulatory Visit (HOSPITAL_COMMUNITY): Payer: Self-pay | Admitting: Nephrology

## 2022-06-22 ENCOUNTER — Encounter (HOSPITAL_COMMUNITY): Payer: Self-pay | Admitting: *Deleted

## 2022-06-22 ENCOUNTER — Telehealth: Payer: Self-pay

## 2022-06-22 DIAGNOSIS — Z6829 Body mass index (BMI) 29.0-29.9, adult: Secondary | ICD-10-CM

## 2022-06-22 DIAGNOSIS — Z716 Tobacco abuse counseling: Secondary | ICD-10-CM

## 2022-06-22 DIAGNOSIS — K760 Fatty (change of) liver, not elsewhere classified: Secondary | ICD-10-CM

## 2022-06-22 DIAGNOSIS — I129 Hypertensive chronic kidney disease with stage 1 through stage 4 chronic kidney disease, or unspecified chronic kidney disease: Secondary | ICD-10-CM

## 2022-06-22 DIAGNOSIS — N1832 Chronic kidney disease, stage 3b: Secondary | ICD-10-CM

## 2022-06-22 DIAGNOSIS — E2609 Other primary hyperaldosteronism: Secondary | ICD-10-CM

## 2022-06-22 NOTE — Telephone Encounter (Signed)
Patient reviewed and responded to MyChart message. Last read by [name] at  9:38 AM on 06/22/2022.

## 2022-06-22 NOTE — Telephone Encounter (Signed)
-----   Message from Yevette Edwards, RN sent at 09/19/2021 10:02 AM EDT ----- Regarding: Labs Hepatic function panel, need to enter order

## 2022-06-22 NOTE — Telephone Encounter (Signed)
MyChart message sent to patient with lab reminder. Lab order in epic. 

## 2022-07-06 ENCOUNTER — Encounter: Payer: Self-pay | Admitting: Cardiology

## 2022-07-06 ENCOUNTER — Ambulatory Visit (INDEPENDENT_AMBULATORY_CARE_PROVIDER_SITE_OTHER): Payer: Medicare Other | Admitting: Cardiology

## 2022-07-06 VITALS — BP 132/80 | HR 72 | Ht 67.0 in | Wt 191.8 lb

## 2022-07-06 DIAGNOSIS — I480 Paroxysmal atrial fibrillation: Secondary | ICD-10-CM

## 2022-07-06 DIAGNOSIS — E2609 Other primary hyperaldosteronism: Secondary | ICD-10-CM | POA: Diagnosis present

## 2022-07-06 DIAGNOSIS — I351 Nonrheumatic aortic (valve) insufficiency: Secondary | ICD-10-CM | POA: Diagnosis present

## 2022-07-06 DIAGNOSIS — I1A0 Resistant hypertension: Secondary | ICD-10-CM

## 2022-07-06 DIAGNOSIS — Z716 Tobacco abuse counseling: Secondary | ICD-10-CM | POA: Diagnosis present

## 2022-07-06 DIAGNOSIS — Z6829 Body mass index (BMI) 29.0-29.9, adult: Secondary | ICD-10-CM | POA: Diagnosis present

## 2022-07-06 DIAGNOSIS — N1832 Chronic kidney disease, stage 3b: Secondary | ICD-10-CM | POA: Diagnosis present

## 2022-07-06 DIAGNOSIS — I129 Hypertensive chronic kidney disease with stage 1 through stage 4 chronic kidney disease, or unspecified chronic kidney disease: Secondary | ICD-10-CM | POA: Diagnosis present

## 2022-07-06 NOTE — Patient Instructions (Signed)

## 2022-07-06 NOTE — Progress Notes (Signed)
Clinical Summary Stephanie Ewing is a 67 y.o.female een today for follow up of the following medical problems.    Previously followed by Dr Ruel Favors at [facility]     1. PAF - beta blocker stopped by endocrinology given bp imroved on aldactone alone - she reports 2 episodes of afib in setting of hypokalemia. From chart review appears K was low to mid 2 range both times.   - 02/2017 event monitor without afib/aflutter, did have some PACs/PVCs, short runs SVT.  - anticoag was stopped due to isolated events with no clear recurrence        06/2020 event monitor SR and rare PVCs, no afib or aflutter    - very infrequent palpitations - compliant with meds     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 Mild to moderate AI - noted by echo 2018     -  10/2019 echo LVEF 60-65%, mod AI - 12/2021 echo: LVEF 60-65%, mild MR, mild AI        5. 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 Lexiscan nuclear stress test was normal. 02/05/2017 nuclear stress test: Lexiscan nuclear stress tes tNo significant EKG changes. Perfusion scan is normal with a normal ejection fraction and no ischemia. This is considered low risk result     Retired respiratory therapist Past Medical History:  Diagnosis Date   Afib ([place])    Arthritis    psoratic   Asthma    "weak"    Constipation    Depression    Dysrhythmia    AFIb   GERD (gastroesophageal reflux  disease)    History of hiatal hernia    Hyperaldosteronism (H[facility])    Hypertension    Hypokalemia    Hypothyroidism    Insomnia    Migraine    ocassional   Nicotine dependence, cigarettes, uncomplicated    Pancreatitis    PONV (postoperative nausea and vomiting)    Pre-diabetes    Thyroid disease    hypothyroid     No Known Allergies   Current Outpatient Medications  Medication Sig Dispense Refill   acetaminophen (TYLENOL) 325 MG tablet Take 2 tablets (650 mg total) by mouth every 6 (six) hours. (Patient not taking: Reported on 01/26/2022)     albuterol (VENTOLIN HFA) 108 (90 Base) MCG/ACT inhaler Inhale into the lungs every 6 (six) hours as needed for wheezing or shortness of breath. (Patient not taking: Reported on 03/21/2022)     aspirin EC 81 MG tablet Take 1 tablet (81 mg total) by mouth at bedtime. 30 tablet 11   azelastine (ASTELIN) 0.1 % nasal spray Place 1 spray into both nostrils at bedtime. Use in each nostril as directed (Patient not taking: Reported on 03/21/2022)     baclofen (LIORESAL) 10 MG tablet Take 1 tablet (10 mg total) by mouth at bedtime. 30 each 1   Cholecalciferol (VITAMIN D) 50 MCG (2000 UT) CAPS  Take 2,000 Units by mouth in the morning.     citalopram (CELEXA) 20 MG tablet Take 20 mg by mouth daily.     diltiazem (CARDIZEM) 30 MG tablet Take 1 tablet every 4 hours AS NEEDED for heart rate >100 as long as top blood pressure >100. (Patient not taking: Reported on 01/26/2022) 45 tablet 1   diphenhydrAMINE (BENADRYL) 25 MG tablet Take 100 mg by mouth at bedtime.     fexofenadine (ALLEGRA) 180 MG tablet Take 180 mg by mouth in the morning.     fluticasone-salmeterol (ADVAIR HFA) 230-21 MCG/ACT inhaler Inhale 2 puffs into the lungs as needed. (Patient not taking: Reported on 03/21/2022)     gabapentin (NEURONTIN) 600 MG tablet Take 600 mg by mouth at bedtime.     Golimumab (SIMPONI ARIA IV) Inject 1 Dose into the vein as directed. Every 2 months (Patient not taking:  Reported on 03/21/2022)     HYDROMET 5-1.5 MG/5ML syrup Take 5 mLs by mouth 3 (three) times daily as needed.     levothyroxine (SYNTHROID) 137 MCG tablet Take 137 mcg by mouth daily before breakfast.     meloxicam (MOBIC) 15 MG tablet Take 15 mg by mouth daily.     montelukast (SINGULAIR) 10 MG tablet Take 10 mg by mouth at bedtime.     omeprazole (PRILOSEC) 40 MG capsule Take 1 capsule (40 mg total) by mouth 2 (two) times daily as needed. 180 capsule 3   polyethylene glycol (MIRALAX) 17 g packet Take 17 g by mouth daily as needed. (Patient not taking: Reported on 03/21/2022) 14 each 0   pramipexole (MIRAPEX) 0.5 MG tablet Take 0.5 mg by mouth at bedtime.     RESTASIS 0.05 % ophthalmic emulsion Place 1 drop into both eyes 2 (two) times daily.     sodium chloride (OCEAN) 0.65 % SOLN nasal spray Place 1 spray into both nostrils as needed for congestion. (Patient not taking: Reported on 03/21/2022)     spironolactone (ALDACTONE) 50 MG tablet 1 tablet     Tenapanor HCl (IBSRELA) 50 MG TABS Take 50 mg by mouth 2 (two) times daily before a meal. 60 tablet 2   traMADol (ULTRAM) 50 MG tablet 1 tablet as needed Orally Once a day     trazodone (DESYREL) 300 MG tablet Take 300 mg by mouth at bedtime.     No current facility-administered medications for this visit.     Past Surgical History:  Procedure Laterality Date   ANTERIOR CERVICAL DECOMPRESSION/DISCECTOMY FUSION 4 LEVELS N/A 07/20/2020   Procedure: ANTERIOR CERVICAL DECOMPRESSION FUSION CERVICAL FOUR-CERVICAL FIVE, CERVICAL FIVE-CERVICAL SIX, CERVICAL SIX-CERVICAL SEVEN;  Surgeon: Consuella Lose, MD;  Location: [place];  Service: Neurosurgery;  Laterality: N/A;   BIOPSY  06/28/2020   Procedure: BIOPSY;  Surgeon: Rush Landmark Telford Nab., MD;  Location: [facility];  Service: Gastroenterology;;   CHOLECYSTECTOMY N/A 12/30/2020   Procedure: LAPAROSCOPIC CHOLECYSTECTOMY;  Surgeon: Ileana Roup, MD;  Location: [facility];  Service: General;   Laterality: N/A;   elbow surgery Right    ESOPHAGOGASTRODUODENOSCOPY (EGD) WITH PROPOFOL N/A 06/28/2020   Procedure: ESOPHAGOGASTRODUODENOSCOPY (EGD) WITH PROPOFOL;  Surgeon: Irving Copas., MD;  Location: [facility];  Service: Gastroenterology;  Laterality: N/A;   ESOPHAGOGASTRODUODENOSCOPY (EGD) WITH PROPOFOL N/A 08/05/2020   Procedure: ESOPHAGOGASTRODUODENOSCOPY (EGD) WITH PROPOFOL;  Surgeon: Rush Landmark Telford Nab., MD;  Location: [place];  Service: Gastroenterology;  Laterality: N/A;   EUS N/A 06/28/2020   Procedure: UPPER ENDOSCOPIC ULTRASOUND (EUS) LINEAR;  Surgeon: Justice Britain  Brooke Bonito., MD;  Location: [facility];  Service: Gastroenterology;  Laterality: N/A;   EUS N/A 08/05/2020   Procedure: UPPER ENDOSCOPIC ULTRASOUND (EUS) RADIAL;  Surgeon: Irving Copas., MD;  Location: [place];  Service: Gastroenterology;  Laterality: N/A;   FINE NEEDLE ASPIRATION  06/28/2020   Procedure: FINE NEEDLE ASPIRATION (FNA) LINEAR;  Surgeon: Irving Copas., MD;  Location: [facility];  Service: Gastroenterology;;   FINE NEEDLE ASPIRATION  08/05/2020   Procedure: FINE NEEDLE ASPIRATION;  Surgeon: Irving Copas., MD;  Location: [place];  Service: Gastroenterology;;   POLYPECTOMY  06/28/2020   Procedure: POLYPECTOMY;  Surgeon: Irving Copas., MD;  Location: [facility];  Service: Gastroenterology;;   SHOULDER SURGERY Bilateral    inpingement release   TOTAL ABDOMINAL HYSTERECTOMY  2015   ULNAR NERVE REPAIR Right      No Known Allergies    Family History  Problem Relation Age of Onset   Hypertension Mother    Thyroid disease Mother        hypothyroidism   Asthma Father    Heart disease Father        from scarlet fever; died age 87 MVA   Hypertension Sister    Hypothyroidism Sister    Other Maternal Aunt        fatty liver   Hypertension Maternal Grandmother    Heart failure Maternal Grandmother    Diabetes Paternal  Grandmother    Heart failure Paternal Grandmother    Thyroid cancer Cousin    Colon cancer Neg Hx    Esophageal cancer Neg Hx    Pancreatic cancer Neg Hx    Stomach cancer Neg Hx    Breast cancer Neg Hx    Rectal cancer Neg Hx      Social History [name] reports that she has been smoking cigarettes. She started smoking about 51 years ago. She has a 18.75 pack-year smoking history. She has never used smokeless tobacco. [name] reports that she does not currently use alcohol.   Review of Systems CONSTITUTIONAL: No weight loss, fever, chills, weakness or fatigue.  HEENT: Eyes: No visual loss, blurred vision, double vision or yellow sclerae.No hearing loss, sneezing, congestion, runny nose or sore throat.  SKIN: No rash or itching.  CARDIOVASCULAR: per hpi RESPIRATORY: No shortness of breath, cough or sputum.  GASTROINTESTINAL: No anorexia, nausea, vomiting or diarrhea. No abdominal pain or blood.  GENITOURINARY: No burning on urination, no polyuria NEUROLOGICAL: No headache, dizziness, syncope, paralysis, ataxia, numbness or tingling in the extremities. No change in bowel or bladder control.  MUSCULOSKELETAL: No muscle, back pain, joint pain or stiffness.  LYMPHATICS: No enlarged nodes. No history of splenectomy.  PSYCHIATRIC: No history of depression or anxiety.  ENDOCRINOLOGIC: No reports of sweating, cold or heat intolerance. No polyuria or polydipsia.  Marland Kitchen   Physical Examination Today's Vitals   07/06/22 1256  BP: 132/80  Pulse: 72  SpO2: 95%  Weight: 191 lb 12.8 oz (87 kg)  Height: 5' 7"$  (1.702 m)   Body mass index is 30.04 kg/m.  Gen: resting comfortably, no acute distress HEENT: no scleral icterus, pupils equal round and reactive, no palptable cervical adenopathy,  CV: RRR, no m/r/g no jvd Resp: Clear to auscultation bilaterally GI: abdomen is soft, non-tender, non-distended, normal bowel sounds, no hepatosplenomegaly MSK: extremities are warm, no edema.   Skin: warm, no rash Neuro:  no focal deficits Psych: appropriate affect   Diagnostic Studies   01/2017 Nuclear perfusion stress test: 1. Normal  myocardial perfusion imaging without ischemia or infarction. 2. Normal post-stress LVEF, 55%. Normal regional wall motion. TID 0.93. 3. Patient achieved 10 METs by Bruce protocol without chest pain or EKG changes but could not reach heart rate target. With vasodilator administration, no EKG changes. Patient reported chest pain with vasodilator, a non-specific finding. 4. Hypertensive response to exercise. 5. No prior study available for comparison. Electronically signed Dr Hurshel Party, MD 02/06/17 9:35 AM  01/2017 Echocardiogram: Mild sclerosis of the trileaflet aortic valve, with adequate leaflet motion. Mild-to-moderate aortic regurgitation is present. The left atrial size is normal. There is normal left ventricular size. Global left ventricular systolic function is normal. The estimated left ventricular ejection fraction is 60% Normal left ventricular diastolic filling pattern. Normal right ventricle size and function. Normal RVSP ---------------------------------------------------------------- Electronically signed by Ruel Favors W(Interpreting physician) on 01/18/2017 06:58 PM ----------------------------------------------------------------   02/2017 Thirty day event monitor: 1. Ventricular arrhythmias occasional PVCs with no couplets triplets or runs of ventricular tachycardia 2. Supraventricular arrhythmias occasional PACs with occasional short runs of nonsustained SVT 3. Atrial fibrillation/atrial flutter none 4. Bradycardia arrhythmias no significant Brady arrhythmias 5. Symptom correlation with arrhythmias patient's symptoms did occur with atrial and ventricular ectopy and short runs of nonsustained SVT  Impressions: Occasional PVCs with no high-grade ventricular ectopy Occasional PACs with occasional brief short runs  of nonsustained SVT Symptoms occurred with PACs and PVCs and short runs of nonsustained SVT Overall no hemodynamically significant arrhythmias present  Electronically signed by: Holly Bodily, DO, [facility]       Jan 2022 CTA IMPRESSION: 1. No aortic dissection or acute aortic abnormality. No pulmonary embolus. 2. Minor distal esophageal wall thickening, can be seen with reflux or esophagitis. 3. Improved peripancreatic fat stranding from last month. 4. Moderate colonic stool burden with tortuous sigmoid colon, suggesting constipation.    12/2021 echo 1. Left ventricular ejection fraction, by estimation, is 60 to 65%. The  left ventricle has normal function. The left ventricle has no regional  wall motion abnormalities. Left ventricular diastolic parameters are  indeterminate. The average left  ventricular global longitudinal strain is -18.6 %. The global longitudinal  strain is normal.   2. Right ventricular systolic function is normal. The right ventricular  size is normal.   3. The mitral valve is abnormal. Mild mitral valve regurgitation. No  evidence of mitral stenosis.   4. The aortic valve is tricuspid. There is mild calcification of the  aortic valve. There is mild thickening of the aortic valve. Aortic valve  regurgitation is mild.   5. The inferior vena cava is normal in size with greater than 50%  respiratory variability, suggesting right atrial pressure of 3 mmHg.    Assessment and Plan   1. PAF - 2 isolated episodes of afib in setting of severe hypokalemia. No clear recurence, including by a 30 day event monitor by her previous cardiologist and a repeat monitor here in 06/2020 - anticoag was discontinued.   -no significant symptoms, continue to monitor - EKG today shows NSR   2. Resistant HTN - significnatly improved on aldactone, she was diagnosed with hyperaldo -aldo dosing per endocrine - bp remains at goal   3. Aortic regurgitation -mild to moderate  AI, repeat echo 2025   F/u 1 year   Arnoldo Lenis, M.D.

## 2022-07-07 ENCOUNTER — Other Ambulatory Visit (HOSPITAL_COMMUNITY): Payer: Self-pay | Admitting: Neurosurgery

## 2022-07-07 ENCOUNTER — Ambulatory Visit (HOSPITAL_COMMUNITY)
Admission: RE | Admit: 2022-07-07 | Discharge: 2022-07-07 | Disposition: A | Discharge status: Home / Self Care | Payer: Medicare Other | Source: Ambulatory Visit | Attending: Nephrology | Admitting: Nephrology

## 2022-07-07 DIAGNOSIS — E2609 Other primary hyperaldosteronism: Secondary | ICD-10-CM | POA: Insufficient documentation

## 2022-07-07 DIAGNOSIS — I129 Hypertensive chronic kidney disease with stage 1 through stage 4 chronic kidney disease, or unspecified chronic kidney disease: Secondary | ICD-10-CM | POA: Insufficient documentation

## 2022-07-07 DIAGNOSIS — Z6829 Body mass index (BMI) 29.0-29.9, adult: Secondary | ICD-10-CM

## 2022-07-07 DIAGNOSIS — M5416 Radiculopathy, lumbar region: Secondary | ICD-10-CM

## 2022-07-07 DIAGNOSIS — I1A0 Resistant hypertension: Secondary | ICD-10-CM | POA: Insufficient documentation

## 2022-07-07 DIAGNOSIS — I480 Paroxysmal atrial fibrillation: Secondary | ICD-10-CM | POA: Insufficient documentation

## 2022-07-07 DIAGNOSIS — N1832 Chronic kidney disease, stage 3b: Secondary | ICD-10-CM

## 2022-07-07 DIAGNOSIS — I351 Nonrheumatic aortic (valve) insufficiency: Secondary | ICD-10-CM | POA: Insufficient documentation

## 2022-07-07 DIAGNOSIS — Z716 Tobacco abuse counseling: Secondary | ICD-10-CM | POA: Insufficient documentation

## 2022-07-12 ENCOUNTER — Other Ambulatory Visit: Payer: Self-pay

## 2022-07-12 MED ORDER — BACLOFEN 10 MG PO TABS: 10.0000 mg | ORAL_TABLET | Freq: Every day | ORAL | 1 refills | Status: DC

## 2022-07-14 ENCOUNTER — Telehealth: Payer: Self-pay

## 2022-07-14 DIAGNOSIS — R933 Abnormal findings on diagnostic imaging of other parts of digestive tract: Secondary | ICD-10-CM

## 2022-07-14 NOTE — Telephone Encounter (Signed)
-----   Message from Lindon Romp, [place] sent at 01/18/2022 12:02 PM EDT ----- Patient needs repeat MRCP in 07/2022 due to abnormal MRCP

## 2022-07-14 NOTE — Telephone Encounter (Signed)
Left message for patient to return my call.

## 2022-07-14 NOTE — Telephone Encounter (Signed)
Informed patient that I have put the order for her MRI/MRCP in the system to be scheduled at [facility] [address]. The radiology department will contact her within a week. If for some reason she does not hear from them then call them and provided phone number. Patient verbalized understanding.

## 2022-07-19 ENCOUNTER — Ambulatory Visit: Payer: Medicare Other

## 2022-07-20 ENCOUNTER — Ambulatory Visit (INDEPENDENT_AMBULATORY_CARE_PROVIDER_SITE_OTHER): Payer: Medicare Other | Admitting: Gastroenterology

## 2022-07-20 ENCOUNTER — Ambulatory Visit: Payer: Medicare Other

## 2022-07-20 DIAGNOSIS — Z23 Encounter for immunization: Secondary | ICD-10-CM | POA: Diagnosis not present

## 2022-07-20 NOTE — Progress Notes (Signed)
Patient stated she thought she was here for A/B and I told her that we have A in her chart as being administrated. So I told her I would ask a nurse and she is non reactive to A and B but I told her that I would have to send a message to the Dr about her Hep B vaccine because she never started the series and she said not to ask because her body hasn't taken these injections anyway but she said we would proceed with Hep A today

## 2022-07-31 ENCOUNTER — Other Ambulatory Visit: Payer: Self-pay

## 2022-07-31 ENCOUNTER — Encounter: Payer: Self-pay | Admitting: Gastroenterology

## 2022-07-31 MED ORDER — OMEPRAZOLE 40 MG PO CPDR: 40.0000 mg | DELAYED_RELEASE_CAPSULE | Freq: Two times a day (BID) | ORAL | 1 refills | Status: DC | PRN

## 2022-08-02 ENCOUNTER — Other Ambulatory Visit: Payer: Self-pay | Admitting: Gastroenterology

## 2022-08-02 ENCOUNTER — Ambulatory Visit (HOSPITAL_COMMUNITY)
Admission: RE | Admit: 2022-08-02 | Discharge: 2022-08-02 | Disposition: A | Discharge status: Home / Self Care | Payer: Medicare Other | Source: Ambulatory Visit | Attending: Gastroenterology | Admitting: Gastroenterology

## 2022-08-02 ENCOUNTER — Ambulatory Visit (HOSPITAL_COMMUNITY)
Admission: RE | Admit: 2022-08-02 | Discharge: 2022-08-02 | Disposition: A | Discharge status: Home / Self Care | Payer: Medicare Other | Source: Ambulatory Visit | Attending: Neurosurgery | Admitting: Neurosurgery

## 2022-08-02 DIAGNOSIS — R933 Abnormal findings on diagnostic imaging of other parts of digestive tract: Secondary | ICD-10-CM

## 2022-08-02 DIAGNOSIS — M5416 Radiculopathy, lumbar region: Secondary | ICD-10-CM | POA: Diagnosis present

## 2022-08-02 MED ORDER — GADOBUTROL 1 MMOL/ML IV SOLN
8.5000 mL | Freq: Once | INTRAVENOUS | Status: AC | PRN
  Administered 2022-08-02: 8.5 mL via INTRAVENOUS

## 2022-09-18 ENCOUNTER — Other Ambulatory Visit: Payer: Self-pay

## 2022-09-18 ENCOUNTER — Encounter: Payer: Self-pay | Admitting: Gastroenterology

## 2022-09-18 MED ORDER — BACLOFEN 10 MG PO TABS: 10.0000 mg | ORAL_TABLET | Freq: Every day | ORAL | 0 refills | Status: DC

## 2022-11-17 ENCOUNTER — Encounter: Payer: Self-pay | Admitting: Gastroenterology

## 2022-12-22 ENCOUNTER — Other Ambulatory Visit: Payer: Self-pay | Admitting: Gastroenterology

## 2022-12-22 MED ORDER — BACLOFEN 10 MG PO TABS: 10.0000 mg | ORAL_TABLET | Freq: Every day | ORAL | 0 refills | Status: DC

## 2023-01-03 ENCOUNTER — Other Ambulatory Visit (INDEPENDENT_AMBULATORY_CARE_PROVIDER_SITE_OTHER): Payer: Medicare Other

## 2023-01-03 ENCOUNTER — Encounter: Payer: Self-pay | Admitting: Gastroenterology

## 2023-01-03 DIAGNOSIS — Z800 Family history of malignant neoplasm of digestive organs: Secondary | ICD-10-CM | POA: Diagnosis not present

## 2023-01-03 DIAGNOSIS — K8500 Idiopathic acute pancreatitis without necrosis or infection: Secondary | ICD-10-CM

## 2023-01-03 LAB — COMPREHENSIVE METABOLIC PANEL
ALT: 41 U/L — ABNORMAL HIGH (ref 0–35)
AST: 22 U/L (ref 0–37)
Albumin: 4.2 g/dL (ref 3.5–5.2)
Alkaline Phosphatase: 75 U/L (ref 39–117)
BUN: 18 mg/dL (ref 6–23)
CO2: 29 mEq/L (ref 19–32)
Calcium: 9.7 mg/dL (ref 8.4–10.5)
Chloride: 102 mEq/L (ref 96–112)
Creatinine, Ser: 1.2 mg/dL (ref 0.40–1.20)
GFR: 46.74 mL/min — ABNORMAL LOW (ref >60.00)
Glucose, Bld: 155 mg/dL — ABNORMAL HIGH (ref 70–99)
Potassium: 4.4 mEq/L (ref 3.5–5.1)
Sodium: 137 mEq/L (ref 135–145)
Total Bilirubin: 0.4 mg/dL (ref 0.2–1.2)
Total Protein: 7.2 g/dL (ref 6.0–8.3)

## 2023-01-03 LAB — LIPASE: Lipase: 599 U/L — ABNORMAL HIGH (ref 11.0–59.0)

## 2023-01-03 LAB — CBC WITH DIFFERENTIAL/PLATELET
Basophils Absolute: 0.1 10*3/uL (ref 0.0–0.1)
Basophils Relative: 0.7 % (ref 0.0–3.0)
Eosinophils Absolute: 0.2 10*3/uL (ref 0.0–0.7)
Eosinophils Relative: 1.7 % (ref 0.0–5.0)
HCT: 45.7 % (ref 36.0–46.0)
Hemoglobin: 15 g/dL (ref 12.0–15.0)
Lymphocytes Relative: 16.4 % (ref 12.0–46.0)
Lymphs Abs: 1.7 10*3/uL (ref 0.7–4.0)
MCHC: 32.9 g/dL (ref 30.0–36.0)
MCV: 89.4 fl (ref 78.0–100.0)
Monocytes Absolute: 1 10*3/uL (ref 0.1–1.0)
Monocytes Relative: 9.9 % (ref 3.0–12.0)
Neutro Abs: 7.3 10*3/uL (ref 1.4–7.7)
Neutrophils Relative %: 71.3 % (ref 43.0–77.0)
Platelets: 253 10*3/uL (ref 150.0–400.0)
RBC: 5.12 Mil/uL — ABNORMAL HIGH (ref 3.87–5.11)
RDW: 14 % (ref 11.5–15.5)
WBC: 10.2 10*3/uL (ref 4.0–10.5)

## 2023-01-03 NOTE — Telephone Encounter (Signed)
.    Lab orders in epic.

## 2023-01-04 ENCOUNTER — Other Ambulatory Visit: Payer: Self-pay

## 2023-01-04 ENCOUNTER — Encounter (HOSPITAL_COMMUNITY): Payer: Self-pay

## 2023-01-04 ENCOUNTER — Emergency Department (HOSPITAL_COMMUNITY)
Admission: EM | Admit: 2023-01-04 | Discharge: 2023-01-04 | Disposition: A | Discharge status: Home / Self Care | Payer: Medicare Other | Source: Home / Self Care

## 2023-01-04 ENCOUNTER — Emergency Department (HOSPITAL_COMMUNITY): Payer: Medicare Other

## 2023-01-04 DIAGNOSIS — Z7982 Long term (current) use of aspirin: Secondary | ICD-10-CM | POA: Diagnosis not present

## 2023-01-04 DIAGNOSIS — K859 Acute pancreatitis without necrosis or infection, unspecified: Secondary | ICD-10-CM | POA: Insufficient documentation

## 2023-01-04 DIAGNOSIS — D72829 Elevated white blood cell count, unspecified: Secondary | ICD-10-CM | POA: Insufficient documentation

## 2023-01-04 DIAGNOSIS — R1013 Epigastric pain: Secondary | ICD-10-CM | POA: Diagnosis present

## 2023-01-04 LAB — COMPREHENSIVE METABOLIC PANEL
ALT: 46 U/L — ABNORMAL HIGH (ref 0–44)
AST: 24 U/L (ref 15–41)
Albumin: 3.4 g/dL — ABNORMAL LOW (ref 3.5–5.0)
Alkaline Phosphatase: 66 U/L (ref 38–126)
Anion gap: 9 (ref 5–15)
BUN: 14 mg/dL (ref 8–23)
CO2: 24 mmol/L (ref 22–32)
Calcium: 9.3 mg/dL (ref 8.9–10.3)
Chloride: 101 mmol/L (ref 98–111)
Creatinine, Ser: 1.17 mg/dL — ABNORMAL HIGH (ref 0.44–1.00)
GFR, Estimated: 51 mL/min — ABNORMAL LOW (ref >60)
Glucose, Bld: 117 mg/dL — ABNORMAL HIGH (ref 70–99)
Potassium: 3.6 mmol/L (ref 3.5–5.1)
Sodium: 134 mmol/L — ABNORMAL LOW (ref 135–145)
Total Bilirubin: 0.6 mg/dL (ref 0.3–1.2)
Total Protein: 6.6 g/dL (ref 6.5–8.1)

## 2023-01-04 LAB — LIPASE, BLOOD: Lipase: 172 U/L — ABNORMAL HIGH (ref 11–51)

## 2023-01-04 LAB — CBC
HCT: 44.5 % (ref 36.0–46.0)
Hemoglobin: 14.7 g/dL (ref 12.0–15.0)
MCH: 28.8 pg (ref 26.0–34.0)
MCHC: 33 g/dL (ref 30.0–36.0)
MCV: 87.3 fL (ref 80.0–100.0)
Platelets: 236 10*3/uL (ref 150–400)
RBC: 5.1 MIL/uL (ref 3.87–5.11)
RDW: 12.9 % (ref 11.5–15.5)
WBC: 10.7 10*3/uL — ABNORMAL HIGH (ref 4.0–10.5)
nRBC: 0 % (ref 0.0–0.2)

## 2023-01-04 MED ORDER — OXYCODONE-ACETAMINOPHEN 5-325 MG PO TABS
1.0000 | ORAL_TABLET | Freq: Once | ORAL | Status: AC
  Administered 2023-01-04: 1 via ORAL
  Filled 2023-01-04: qty 1

## 2023-01-04 MED ORDER — HYDROMORPHONE HCL 1 MG/ML IJ SOLN
0.5000 mg | Freq: Once | INTRAMUSCULAR | Status: AC
  Administered 2023-01-04: 0.5 mg via INTRAVENOUS
  Filled 2023-01-04: qty 1

## 2023-01-04 MED ORDER — ONDANSETRON HCL 4 MG PO TABS: 4.0000 mg | ORAL_TABLET | Freq: Four times a day (QID) | ORAL | 0 refills | Status: AC

## 2023-01-04 MED ORDER — OXYCODONE-ACETAMINOPHEN 5-325 MG PO TABS: 1.0000 | ORAL_TABLET | Freq: Four times a day (QID) | ORAL | 0 refills | Status: AC | PRN

## 2023-01-04 MED ORDER — IOHEXOL 350 MG/ML SOLN
75.0000 mL | Freq: Once | INTRAVENOUS | Status: AC | PRN
  Administered 2023-01-04: 75 mL via INTRAVENOUS

## 2023-01-04 MED ORDER — ONDANSETRON 4 MG PO TBDP
4.0000 mg | ORAL_TABLET | Freq: Once | ORAL | Status: AC
  Administered 2023-01-04: 4 mg via ORAL
  Filled 2023-01-04: qty 1

## 2023-01-04 MED ORDER — LACTATED RINGERS IV SOLN: INTRAVENOUS | Status: DC

## 2023-01-04 NOTE — ED Provider Notes (Signed)
[place] [facility] AT [facility] Provider Note   CSN: [id number] Arrival date & time: 01/04/23  1016     History  Chief Complaint  Patient presents with   Abdominal Pain   Nausea    Stephanie Ewing is a 67 y.o. female.  This is 67 year old female present emergency department epigastric abdominal pain with outpatient labs concerning for pancreatitis.  She reports that she had symptoms for the past 3 to 4 days.  She has been nauseated, had some vomiting several days ago, none for the past few days, but has a decreased appetite.  No fevers, no chills.    Abdominal Pain      Home Medications Prior to Admission medications   Medication Sig Start Date End Date Taking? Authorizing Provider  acetaminophen (TYLENOL) 325 MG tablet Take 2 tablets (650 mg total) by mouth every 6 (six) hours. 04/15/20   Valetta Close, MD  albuterol (VENTOLIN HFA) 108 (90 Base) MCG/ACT inhaler Inhale into the lungs every 6 (six) hours as needed for wheezing or shortness of breath. Patient not taking: Reported on 03/21/2022    [provider]  aspirin EC 81 MG tablet Take 1 tablet (81 mg total) by mouth at bedtime. 07/27/20   Costella, Darci Current, PA-C  azelastine (ASTELIN) 0.1 % nasal spray Place 1 spray into both nostrils at bedtime. Use in each nostril as directed    [provider]  baclofen (LIORESAL) 10 MG tablet Take 1 tablet (10 mg total) by mouth at bedtime. 12/22/22   Armbruster, Willaim Rayas, MD  Cholecalciferol (VITAMIN D) 50 MCG (2000 UT) CAPS Take 2,000 Units by mouth in the morning.    [provider]  citalopram (CELEXA) 20 MG tablet Take 20 mg by mouth daily. 04/17/22   [provider]  diltiazem (CARDIZEM) 30 MG tablet Take 1 tablet every 4 hours AS NEEDED for heart rate >100 as long as top blood pressure >100. Patient not taking: Reported on 01/26/2022 09/19/19   Newman Nip, NP  diphenhydrAMINE (BENADRYL) 25 MG tablet Take 100 mg by  mouth at bedtime.    [provider]  fexofenadine (ALLEGRA) 180 MG tablet Take 180 mg by mouth in the morning.    [provider]  fluticasone-salmeterol (ADVAIR HFA) 230-21 MCG/ACT inhaler Inhale 2 puffs into the lungs as needed.    [provider]  gabapentin (NEURONTIN) 600 MG tablet Take 600 mg by mouth at bedtime. 04/13/22   [provider]  Golimumab (SIMPONI ARIA IV) Inject 1 Dose into the vein as directed. Every 2 months Patient not taking: Reported on 03/21/2022    [provider]  HYDROMET 5-1.5 MG/5ML syrup Take 5 mLs by mouth 3 (three) times daily as needed. 03/31/22   [provider]  levothyroxine (SYNTHROID) 137 MCG tablet Take 137 mcg by mouth daily before breakfast. Patient not taking: Reported on 07/06/2022 04/29/22   [provider]  levothyroxine (SYNTHROID) 150 MCG tablet Take 150 mcg by mouth daily before breakfast.    [provider]  meloxicam (MOBIC) 15 MG tablet Take 15 mg by mouth daily. 04/17/22   [provider]  montelukast (SINGULAIR) 10 MG tablet Take 10 mg by mouth at bedtime.    [provider]  omeprazole (PRILOSEC) 40 MG capsule Take 1 capsule (40 mg total) by mouth 2 (two) times daily as needed. 07/31/22   Armbruster, Willaim Rayas, MD  polyethylene glycol (MIRALAX) 17 g packet Take 17 g  by mouth daily as needed. 06/10/20   Armbruster, Willaim Rayas, MD  pramipexole (MIRAPEX) 0.5 MG tablet Take 0.5 mg by mouth at bedtime. 02/14/22   [provider]  RESTASIS 0.05 % ophthalmic emulsion Place 1 drop into both eyes 2 (two) times daily. 07/29/19   [provider]  sodium chloride (OCEAN) 0.65 % SOLN nasal spray Place 1 spray into both nostrils as needed for congestion.    [provider]  spironolactone (ALDACTONE) 50 MG tablet 1 tablet 09/09/21   [provider]  Tenapanor HCl (IBSRELA) 50 MG TABS Take 50 mg by mouth 2 (two) times daily before a meal.  01/23/22   Armbruster, Willaim Rayas, MD  traMADol (ULTRAM) 50 MG tablet 1 tablet as needed Orally Once a day    [provider]  trazodone (DESYREL) 300 MG tablet Take 300 mg by mouth at bedtime. 04/18/22   [provider]      Allergies    Patient has no known allergies.    Review of Systems   Review of Systems  Gastrointestinal:  Positive for abdominal pain.    Physical Exam Updated Vital Signs BP 122/72   Pulse 83   Temp 98.3 F (36.8 C) (Oral)   Resp 19   Ht 5\' 7"  (1.702 m)   Wt 87 kg   LMP  (LMP Unknown)   SpO2 96%   BMI 30.04 kg/m  Physical Exam Vitals and nursing note reviewed.  HENT:     Head: Normocephalic.  Cardiovascular:     Rate and Rhythm: Normal rate and regular rhythm.  Pulmonary:     Effort: Pulmonary effort is normal.     Breath sounds: Normal breath sounds.  Abdominal:     Palpations: Abdomen is soft.     Tenderness: There is abdominal tenderness in the epigastric area.  Neurological:     Mental Status: She is alert.     ED Results / Procedures / Treatments   Labs (all labs ordered are listed, but only abnormal results are displayed) Labs Reviewed  CBC - Abnormal; Notable for the following components:      Result Value   WBC 10.7 (*)    All other components within normal limits  COMPREHENSIVE METABOLIC PANEL - Abnormal; Notable for the following components:   Sodium 134 (*)    Glucose, Bld 117 (*)    Creatinine, Ser 1.17 (*)    Albumin 3.4 (*)    ALT 46 (*)    GFR, Estimated 51 (*)    All other components within normal limits  LIPASE, BLOOD - Abnormal; Notable for the following components:   Lipase 172 (*)    All other components within normal limits    EKG EKG Interpretation Date/Time:  Thursday January 04 2023 11:22:56 EDT Ventricular Rate:  77 PR Interval:  161 QRS Duration:  90 QT Interval:  425 QTC Calculation: 481 R Axis:   38  Text Interpretation: Sinus rhythm Abnormal R-wave progression, early transition  Confirmed by Estanislado Pandy [phone]) on 01/04/2023 12:16:08 PM  Radiology CT ABDOMEN PELVIS W CONTRAST  Result Date: 01/04/2023 CLINICAL DATA:  Pancreatitis EXAM: CT ABDOMEN AND PELVIS WITH CONTRAST TECHNIQUE: Multidetector CT imaging of the abdomen and pelvis was performed using the standard protocol following bolus administration of intravenous contrast. RADIATION DOSE REDUCTION: This exam was performed according to the departmental dose-optimization program which includes automated exposure control, adjustment of the mA and/or kV according to patient size and/or use of iterative reconstruction technique.  CONTRAST:  75mL OMNIPAQUE IOHEXOL 350 MG/ML SOLN COMPARISON:  MRI 08/02/2022. Older exams as well including CT 2022 April FINDINGS: Lower chest: There is some linear opacity lung bases likely scar or atelectasis. No pleural effusion. Hepatobiliary: Fatty liver infiltration. Previous cholecystectomy. Patent portal vein. Pancreas: There is some peripancreatic fat stranding identified particularly towards the head and neck region and extending into the adjacent mesentery. Please correlate for any clinical evidence of pancreatitis. There is some fatty infiltration of the pancreas. No well-defined fluid collections. Spleen: Normal in size without focal abnormality. Adrenals/Urinary Tract: Adrenal glands are preserved. No enhancing renal mass or collecting system dilatation. There are some tiny low-attenuation lesions along the left kidney which are too small to completely characterize but likely small cysts, Bosniak 2 lesions. No specific imaging follow-up. The ureters have normal course and caliber extending down to the bladder. Preserved contours of the urinary bladder. Stomach/Bowel: Large bowel has a normal course and caliber with scattered stool. The small bowel is nondilated. Normal appendix in the right lower quadrant with some high attenuation debris. Vascular/Lymphatic: Aortic atherosclerosis. No enlarged  abdominal or pelvic lymph nodes. Reproductive: Status post hysterectomy. No adnexal masses. Other: No abdominal wall hernia or abnormality. No abdominopelvic ascites. Musculoskeletal: Scattered degenerative changes of the spine and pelvis. Multilevel disc bulging. IMPRESSION: Peripancreatic fat stranding identified. Please correlate with the provided history of pancreatitis. No complicating features today of the ascites, rim enhancing fluid collections. Preserved pancreatic parenchymal enhancement and preserved patency of the portal and splenic vein. Fatty liver infiltration. Electronically Signed   By: Karen Kays M.D.   On: 01/04/2023 14:02    Procedures Procedures    Medications Ordered in ED Medications  lactated ringers infusion (0 mLs Intravenous Stopped 01/04/23 1547)  HYDROmorphone (DILAUDID) injection 0.5 mg (0.5 mg Intravenous Given 01/04/23 1114)  HYDROmorphone (DILAUDID) injection 0.5 mg (0.5 mg Intravenous Given 01/04/23 1232)  iohexol (OMNIPAQUE) 350 MG/ML injection 75 mL (75 mLs Intravenous Contrast Given 01/04/23 1249)  ondansetron (ZOFRAN-ODT) disintegrating tablet 4 mg (4 mg Oral Given 01/04/23 1502)  oxyCODONE-acetaminophen (PERCOCET/ROXICET) 5-325 MG per tablet 1 tablet (1 tablet Oral Given 01/04/23 1501)    ED Course/ Medical Decision Making/ A&P Clinical Course as of 01/04/23 1550  Thu Jan 04, 2023  1044 MRCP 08/02/22: IMPRESSION: 1. 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 Repeat abdominal MRI with and without IV gadolinium with MRCP is recommended in 1 year to ensure continued stability. 2. Hepatic steatosis.  [TY]  1303 CBC(!) Slight leukocytosis.  No anemia [TY]  1303 Comprehensive metabolic panel(!) No significant metabolic derangements.  Mild elevation in her ALT.  T. bili  normal. [TY]  1303 Lipase(!): 172 Improved compared to yesterday, but still remains above 3 times normal limit suggesting acute pancreatitis. [TY]  1417 CT ABDOMEN PELVIS W CONTRAST IMPRESSION: Peripancreatic fat stranding identified. Please correlate with the provided history of pancreatitis. No complicating features today of the ascites, rim enhancing fluid collections. Preserved pancreatic parenchymal enhancement and preserved patency of the portal and splenic vein.  Fatty liver infiltration.     [TY]  1429 Discussed findings with patient.  Reports that she has been nauseated, but has not been vomiting for the past few days.  She reports that Dilaudid helped with the pain.  Given her lab workup and improvement in her lipase she would like to trial oral Percocets and Zofran and  possible discharge.  She has water in the room that she has been drinking, so she is p.o. tolerant currently.  Her other labs are largely reassuring.  If patient pain is controlled feel that she is appropriate for discharge with pain medicines and anti-emetics [TY]    Clinical Course User Index [TY] Coral Spikes, DO                                 Medical Decision Making This well-appearing 67 year old female presenting emergency department chief complaint of pancreatitis/epigastric abdominal pain.  She is afebrile vital signs reassuring.  Physical exam with soft nonsurgical abdomen.  Some minor tenderness in her epigastrium.  It appears that she had an elevated lipase done with labs yesterday per chart review at 599.  Today lipase is 172.  Comprehensive panel reassuring no transaminitis to suggest hepatobiliary disease other than some minor elevation of her ALT which appears to be chronic in nature.  No elevated bilirubin to suggest biliary obstruction.  She has a mild leukocytosis at 10.7.  Otherwise CBC unremarkable.  Patient received 2 doses of IV Dilaudid with improvement of her pain, also received a liter of  IV fluids.  She is tolerating p.o. here.  Offered admission for her pancreatitis and pain control.  Patient would like to trial home with p.o. meds.  Discussed bowel rest and gradual reintroduction of foods and also return precautions.  Patient agreeable to plan.  All questions answered.  Amount and/or Complexity of Data Reviewed Labs: ordered. Decision-making details documented in ED Course. Radiology: ordered. Decision-making details documented in ED Course. ECG/medicine tests: ordered.  Risk Prescription drug management.          Final Clinical Impression(s) / ED Diagnoses Final diagnoses:  None    Rx / DC Orders ED Discharge Orders     None         Coral Spikes, DO 01/04/23 1550

## 2023-01-04 NOTE — ED Notes (Signed)
Pt tolerating PO meds at this time.

## 2023-01-04 NOTE — ED Notes (Signed)
Patient verbalizes understanding of discharge instructions. Opportunity for questioning and answers were provided. Armband removed by staff, pt discharged from ED. Pt ambulatory to ED waiting room with steady gait.  

## 2023-01-04 NOTE — Discharge Instructions (Addendum)
Please return immediately if develop fevers, chills, worsening abdominal pain, inability eat or drink, severe pain not controlled by pain medication or any new or worsening symptoms that are concerning to you.

## 2023-01-04 NOTE — ED Triage Notes (Signed)
Pt states she has confirmed pancreatitis, lipase 599; c/o nausea and LUQ and back  pain x 1 week; hx of pancreatitis 2 years ago; denies fevers, endorses some increased urinary urgency; took tramadol pta at 0900

## 2023-01-23 ENCOUNTER — Other Ambulatory Visit: Payer: Self-pay

## 2023-01-23 MED ORDER — OMEPRAZOLE 40 MG PO CPDR: 40.0000 mg | DELAYED_RELEASE_CAPSULE | Freq: Two times a day (BID) | ORAL | 1 refills | Status: DC | PRN

## 2023-01-31 ENCOUNTER — Encounter: Payer: Self-pay | Admitting: Gastroenterology

## 2023-04-09 ENCOUNTER — Other Ambulatory Visit: Payer: Self-pay | Admitting: Gastroenterology

## 2023-04-09 MED ORDER — BACLOFEN 10 MG PO TABS: 10.0000 mg | ORAL_TABLET | Freq: Every day | ORAL | 0 refills | Status: DC

## 2023-05-23 ENCOUNTER — Encounter: Payer: Self-pay | Admitting: Gastroenterology

## 2023-05-24 NOTE — Telephone Encounter (Signed)
 Patient already has an appt scheduled with Hyacinth Meeker, PA-C on 06/27/22 at 3 pm. Pt had to cancel appt next week due to scheduling conflict.

## 2023-06-01 ENCOUNTER — Ambulatory Visit: Payer: Medicare Other | Admitting: Gastroenterology

## 2023-06-18 ENCOUNTER — Other Ambulatory Visit: Payer: Self-pay | Admitting: Internal Medicine

## 2023-06-18 DIAGNOSIS — Z1231 Encounter for screening mammogram for malignant neoplasm of breast: Secondary | ICD-10-CM

## 2023-06-19 ENCOUNTER — Ambulatory Visit
Admission: RE | Admit: 2023-06-19 | Discharge: 2023-06-19 | Disposition: A | Discharge status: Home / Self Care | Payer: Medicare Other | Source: Ambulatory Visit | Attending: Internal Medicine | Admitting: Internal Medicine

## 2023-06-19 ENCOUNTER — Other Ambulatory Visit: Payer: Self-pay | Admitting: Family Medicine

## 2023-06-19 DIAGNOSIS — Z1231 Encounter for screening mammogram for malignant neoplasm of breast: Secondary | ICD-10-CM

## 2023-06-26 NOTE — Telephone Encounter (Signed)
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 Thank you

## 2023-06-28 ENCOUNTER — Ambulatory Visit: Payer: Medicare Other | Admitting: Physician Assistant

## 2023-06-29 ENCOUNTER — Ambulatory Visit: Payer: Medicare Other | Admitting: Gastroenterology

## 2023-06-29 ENCOUNTER — Encounter: Payer: Self-pay | Admitting: Gastroenterology

## 2023-06-29 DIAGNOSIS — K862 Cyst of pancreas: Secondary | ICD-10-CM

## 2023-06-29 DIAGNOSIS — K219 Gastro-esophageal reflux disease without esophagitis: Secondary | ICD-10-CM | POA: Diagnosis not present

## 2023-06-29 DIAGNOSIS — K581 Irritable bowel syndrome with constipation: Secondary | ICD-10-CM

## 2023-06-29 DIAGNOSIS — K3184 Gastroparesis: Secondary | ICD-10-CM | POA: Diagnosis not present

## 2023-06-29 MED ORDER — FAMOTIDINE 40 MG PO TABS: 40.0000 mg | ORAL_TABLET | Freq: Every day | ORAL | 2 refills | Status: DC

## 2023-06-29 NOTE — Progress Notes (Signed)
Agree with assessment and plan with the following thoughts.  If the patient is doing poorly with reflux could consider TIF evaluation, that would require repeat endoscopy, would need to be done within 1 year of considering TIF.  However, prior to doing that, if concern for gastroparesis she should have a gastric emptying study to see if that could be causing her refractory symptoms in light of her other upper tract symptoms that are bothering her.  If she had gastroparesis would not be inclined to do a TIF procedure.  If she does not want to do a gastric emptying study would more strongly consider empiric low-dose Reglan first.  I think reasonable to do a gastric emptying study given her symptoms.  Otherwise, can add Pepcid, however if having symptoms despite twice daily omeprazole we could switch to another PPI, cannot recall offhand what other regimen she has been on.  We could also try switching her to Voquenza 10 mg daily.

## 2023-06-29 NOTE — Patient Instructions (Signed)
You have been scheduled for an MRCP  at       on     . Your appointment time is   . Please arrive to admitting (at main entrance of the hospital) 30 minutes prior to your appointment time for registration purposes. Please make certain not to have anything to eat or drink 6 hours prior to your test. In addition, if you have any metal in your body, have a pacemaker or defibrillator, please be sure to let your ordering physician know. This test typically takes 45 minutes to 1 hour to complete. Should you need to reschedule, please call [phone] to do so.   Gastroparesis Please do small frequent meals like 4-6 meals a day.  Eat and drink liquids at separate times.  Avoid high fiber foods, cook your vegetables, avoid high fat food.  Suggest spreading protein throughout the day (greek yogurt, glucerna, soft meat, milk, eggs) Choose soft foods that you can mash with a fork When you are more symptomatic, change to pureed foods foods and liquids.  Consider reading "Living well with Gastroparesis" by Reuel Derby Gastroparesis is a condition in which food takes longer than normal to empty from the stomach. This condition is also known as delayed gastric emptying. It is usually a long-term (chronic) condition. There is no cure, but there are treatments and things that you can do at home to help relieve symptoms. Treating the underlying condition that causes gastroparesis can also help relieve symptoms What are the causes? In many cases, the cause of this condition is not known. Possible causes include: A hormone (endocrine) disorder, such as hypothyroidism or diabetes. A nervous system disease, such as Parkinson's disease or multiple sclerosis. Cancer, infection, or surgery that affects the stomach or vagus nerve. The vagus nerve runs from your chest, through your neck, and to the lower part of your brain. A connective tissue disorder, such as scleroderma. Certain medicines. What increases the  risk? You are more likely to develop this condition if: You have certain disorders or diseases. These may include: An endocrine disorder. An eating disorder. Amyloidosis. Scleroderma. Parkinson's disease. Multiple sclerosis. Cancer or infection of the stomach or the vagus nerve. You have had surgery on your stomach or vagus nerve. You take certain medicines. You are female. What are the signs or symptoms? Symptoms of this condition include: Feeling full after eating very little or a loss of appetite. Nausea, vomiting, or heartburn. Bloating of your abdomen. Inconsistent blood sugar (glucose) levels on blood tests. Unexplained weight loss. Acid from the stomach coming up into the esophagus (gastroesophageal reflux). Sudden tightening (spasm) of the stomach, which can be painful. Symptoms may come and go. Some people may not notice any symptoms. How is this diagnosed? This condition is diagnosed with tests, such as: Tests that check how long it takes food to move through the stomach and intestines. These tests include: Upper gastrointestinal (GI) series. For this test, you drink a liquid that shows up well on X-rays, and then X-rays are taken of your intestines. Gastric emptying scintigraphy. For this test, you eat food that contains a small amount of radioactive material, and then scans are taken. Wireless capsule GI monitoring system. For this test, you swallow a pill (capsule) that records information about how foods and fluid move through your stomach. Gastric manometry. For this test, a tube is passed down your throat and into your stomach to measure electrical and muscular activity. Endoscopy. For this test, a long, thin tube with a  camera and light on the end is passed down your throat and into your stomach to check for problems in your stomach lining. Ultrasound. This test uses sound waves to create images of the inside of your body. This can help rule out gallbladder disease or  pancreatitis as a cause of your symptoms. How is this treated? There is no cure for this condition, but treatment and home care may relieve symptoms. Treatment may include: Treating the underlying cause. Managing your symptoms by making changes to your diet and exercise habits. Taking medicines to control nausea and vomiting and to stimulate stomach muscles. Getting food through a feeding tube in the hospital. This may be done in severe cases. Having surgery to insert a device called a gastric electrical stimulator into your body. This device helps improve stomach emptying and control nausea and vomiting. Follow these instructions at home: Take over-the-counter and prescription medicines only as told by your health care provider. Follow instructions from your health care provider about eating or drinking restrictions. Your health care provider may recommend that you: Eat smaller meals more often. Eat low-fat foods. Eat low-fiber forms of high-fiber foods. For example, eat cooked vegetables instead of raw vegetables. Have only liquid foods instead of solid foods. Liquid foods are easier to digest. Drink enough fluid to keep your urine pale yellow. Exercise as often as told by your health care provider. Keep all follow-up visits. This is important. Contact a health care provider if you: Notice that your symptoms do not improve with treatment. Have new symptoms. Get help right away if you: Have severe pain in your abdomen that does not improve with treatment. Have nausea that is severe or does not go away. Vomit every time you drink fluids. Summary Gastroparesis is a long-term (chronic) condition in which food takes longer than normal to empty from the stomach. Symptoms include nausea, vomiting, heartburn, bloating of your abdomen, and loss of appetite. Eating smaller portions, low-fat foods, and low-fiber forms of high-fiber foods may help you manage your symptoms. Get help right away if  you have severe pain in your abdomen. This information is not intended to replace advice given to you by your health care provider. Make sure you discuss any questions you have with your health care provider.  _______________________________________________________  If your blood pressure at your visit was 140/90 or greater, please contact your primary care physician to follow up on this.  _______________________________________________________  If you are age 31 or older, your body mass index should be between 23-30. Your Body mass index is 29.91 kg/m. If this is out of the aforementioned range listed, please consider follow up with your Primary Care Provider.  If you are age 66 or younger, your body mass index should be between 19-25. Your Body mass index is 29.91 kg/m. If this is out of the aformentioned range listed, please consider follow up with your Primary Care Provider.   ________________________________________________________  The [facility] providers would like to encourage you to use [facility] to communicate with providers for non-urgent requests or questions.  Due to long hold times on the telephone, sending your provider a message by [facility] may be a faster and more efficient way to get a response.  Please allow 48 business hours for a response.  Please remember that this is for non-urgent requests.  _______________________________________________________ It was a pleasure to see you today!  Thank you for trusting me with your gastrointestinal care!

## 2023-06-29 NOTE — Progress Notes (Signed)
 Chief Complaint: elevated lipase Primary GI MD: Dr. Adela Lank  HPI: 68 year old female well known to Dr. Adela Lank with medical history including abnormal imaging of her panceras, constipation, GERD, colon polyps and others as listed below presents for evaluation of continued elevation in lipase.  Last OV with Dr. Adela Lank 01/2022, see note for details. Worsening reflux and given baclofen and set up for EGD. After EGD there was some discussion of being a TIF candidate. EGD and colonoscopy as below. Recent admission 12/2022 for acute pancreatitis.  Reviewed multiple patient messages between Dr. Adela Lank and patient with most recent discussing GI side effects after starting Metformin and recently had an elevated lipase of 127.  Initial episode of pancreatitis was in 2021 after starting mexthotrexate. Normal lipids, normal igG4. MRCP feb 2022. That exam was remarkable for a roughly 3.4 cm pancreatic head mass.  She subsequently was referred to Dr. Meridee Score for EUS which was done in February 2022 report as below, she had a heterogeneous region in the pancreatic head, not clearly a mass, biopsied with FNA which showed "atypical cells".  She had a follow-up repeat EUS with Dr. Meridee Score in March 2022, again biopsied and rare atypical cells noted, no evidence of malignancy.  She inquired about a second opinion and was referred to [facility].  She had another EUS in April 2022 at [facility], no mass was noted.  She incidentally had 2 small cysts in her pancreas.  Also on prior EUS's she has noted to have gallstones.  In light of lack of clear cause for her pancreatitis she underwent cholecystectomy in August of last year, recovered from that okay.   Follow up MRCP 07/2021: No pancreatic mass but she does have some fatty infiltration of the pancreas with some associated atrophy, and some subcentimeter cystic lesions in the pancreas, radiology recommended repeat MRI in 1 year.   She recently seen in ED 12/2022 for  acute pancreatitis with CT showing peripancreatic fat stranding. Lipase was 599 at this time.   ----------------TODAY-----------------------  She is experiencing worsening symptoms of gastroesophageal reflux disease (GERD), characterized by a burning sensation in her chest and throat. The symptoms had improved for a while but have recently worsened. She is currently taking omeprazole 40 mg twice daily, but continues to experience significant discomfort, describing the sensation as 'like battery acid.'  She describes feeling full quickly after meals and experiencing significant abdominal distension, particularly after eating larger meals. She notes that she remains full from meals consumed the previous day.  She also has a history of irritable bowel syndrome (IBS) with constipation. She previously used Miralax but found it ineffective. She is currently taking Ibsrela as needed, which she receives through a program that provides it at no cost. She has tried multiple other medications for IBS, including Linzess, Trulance, and Amitiza, but finds Ibsrela to be the most effective despite some side effects. She occasionally uses Dulcolax for additional relief.  She is managing her diabetes with metformin, which she has restarted on her own, and reports no current symptoms related to its use.      PREVIOUS GI WORKUP   CTA 05/26/20 - IMPRESSION: 1. No aortic dissection or acute aortic abnormality. No pulmonary embolus. 2. Minor distal esophageal wall thickening, can be seen with reflux or esophagitis. 3. Improved peripancreatic fat stranding from last month. 4. Moderate colonic stool burden with tortuous sigmoid colon, suggesting constipation.      MRCP 06/23/20:IMPRESSION: 1. Findings are highly concerning for primary pancreatic neoplasm in  the inferior aspect of the pancreatic head measuring approximately 3.4 x 2.2 x 2.4 cm. This exerts mild mass effect upon the adjacent distal pancreatic duct  and distal common bile duct just proximal to the ampulla, without frank ductal obstruction at this time. The possibility of focal pancreatitis is not excluded, however, further evaluation with endoscopic ultrasound is recommended in the near future to better evaluate this region and assess need for potential biopsy. 2. Multiple tiny subcentimeter cystic appearing lesions scattered elsewhere throughout the pancreas, favored to represent small benign lesions such as side-branch IPMN and/or small pancreatic pseudocysts. Repeat abdominal MRI with and without IV gadolinium with MRCP is recommended in 1 year to ensure stability of these lesions. This recommendation follows ACR consensus guidelines: Management of Incidental Pancreatic Cysts: A White Paper of the ACR Incidental Findings Committee. J Am Coll Radiol 2017;14:911-923.     EUS 06/28/20: EGD Impression: - No gross lesions in esophagus proximally. LA Grade B esophagitis with no bleeding distally. - Shatzki ring present. - Hiatal hernia noted. - Erythematous mucosa in the gastric body, incisura and antrum. No other gross lesions in the stomach. Biopsied. - Duodenal polyp in D2 noted. This was removed with cold snare polypectomy. - No gross lesions in the duodenal bulb, in the first portion of the duodenum and in the second portion of the duodenum. Biopsied. - Normal major papilla. EUS Impression: - A heterogenous region/lesion was identified in the pancreatic head. However, the endosonographic appearance is not typical for adenocarcinoma. This could be inflammatory in nature. But with MRI findings and unexplained pancreatitis, FNB was performed. If malignancy is identified, this will be staged T2 N0 Mx by endosonographic criteria. - Multiple cystic lesions were seen in the pancreatic head, pancreatic body and pancreatic tail. Tissue has not been obtained. 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 - There was no sign of significant pathology in the common bile duct and in the common hepatic duct. - No malignant-appearing lymph nodes were visualized in the celiac region (level 20), peripancreatic region and porta hepatis region.   FINAL MICROSCOPIC DIAGNOSIS:   A. STOMACH, BIOPSY:  - Mild chronic gastritis.  - Warthin-Starry stain is negative for Helicobacter pylori.   B. DUODENUM, BIOPSY:  - No significant histopathologic findings.  - Negative for increased intraepithelial lymphocytes and villous  architectural changes.   C. DUODENUM, POLYPECTOMY:  - Adenoma.  - Negative for high grade dysplasia.    FINAL MICROSCOPIC DIAGNOSIS:  - Atypical cells present      EUS 08/05/20: EGD Impression: - No gross lesions in esophagus. Non-obstructing Schatzki ring. - 2 cm hiatal hernia. - No gross lesions in the stomach. - No gross lesions in the duodenal bulb, in the first portion of the duodenum and in the second portion of the duodenum. EUS Impression: - A region of hypointensity was identified in the pancreatic head. This region looks improved and smaller than prior procedure. Because of previous atypical cells noted on lastEUS FNB, tissue was obtained from this exam, and results are pending to try to rule out a malignant process. However, the endosonographic appearance today, with improving features, seems to make the possibility of inflammatory changes more possible though malignancy is not ruled out as of yet. If this turns out to be a malignant process, then this will be staged T1 N0 Mx by endosonographic criteria. Fine needle biopsy performed. - Multiple stones were visualized endosonographically in the gallbladder. - There was no sign of  significant pathology in the common bile duct and in the common hepatic duct (smaller than previous EUS). - No malignant-appearing lymph nodes were visualized in the celiac region (level 20), peripancreatic  region and porta hepatis region.   CYTOLOGY - NON PAP  CASE: [id number]  PATIENT: Stephanie Ewing  Non-Gynecological Cytology Report  Clinical History: Pancreatic mass  FINAL MICROSCOPIC DIAGNOSIS:   A. PANCREAS, HEAD, FINE NEEDLE ASPIRATION:  -  Rare atypical cells present  -  See comment   COMMENT:   There are atypical cells present; insufficient for diagnosis of  malignancy.  In addition, there is minimal inflammation present.  Dr.  Rayetta Pigg reviewed the case and agrees with the above diagnosis.      MRCP 07/21/2021: IMPRESSION: 1. Fatty infiltration and mild atrophy in the head and body of the pancreas. Several subcentimeter cystic foci throughout the pancreas most likely represent prominent ductal side branches or possibly small cysts/pseudocysts. No pancreatic ductal dilatation. Consider follow-up MRI in 12 months. 2. Hepatomegaly with hepatic steatosis.   Repeat MRCP in 12 months     LFTs in March - ALT 42, AST 23, AP 72, T bil 0.4   LFTs 09/15/21 - ALT 38, AST 19, AP 68, T bil 0.4   Iron studies and autoimmune workup negative Negative Hep B / C Not immune to hep A and B -    High risk colon cancer surveillance: Personal history of colonic polyps - 2013 with 6 polyps removed - no report available, then normal exam in 2018 [facility]), told to repeat in 5 years. Of note, patient has had refractory constipation to multiple regimens - Linzess, Trulance, Motegrity, Amitiza, but IBSrela has worked well for her but quite expensive,   Colonoscopy 10/05/21: One 3 mm polyp in the cecum, removed with a cold snare. Resected and retrieved. - One 25 mm polyp in the cecum, removed piecemeal using a cold snare. Resected and retrieved. Clip was placed as outlined. - One 10 mm polyp in the transverse colon, removed with a cold snare. Resected and retrieved. - Two 3 mm polyps in the sigmoid colon, removed with a cold snare. Resected and retrieved. - Internal hemorrhoids. -  The examination was otherwise normal. - No obvious evidence of pelvic floor dysfunction on DRE.   1. Surgical [P], colon, cecum, polyp (1) - TUBULAR ADENOMA WITHOUT HIGH GRADE DYSPLASIA. 2. Surgical [P], colon, cecum, polyp (1) - MULTIPLE FRAGMENTS OF SESSILE SERRATED POLYP WITHOUT CYTOLOGIC DYSPLASIA. 3. Surgical [P], colon, transverse, sigmoid, polyp (3) - SESSILE SERRATED POLYP WITHOUT CYTOLOGIC DYSPLASIA. - HYPERPLASTIC POLYP (S).  EGD 01/2022 - Esophagogastric landmarks identified. - 1 cm hiatal hernia. - Gastroesophageal flap valve classified as Hill Grade II ( fold present, opens with respiration) . - Normal esophagus otherwise - biopsies obtained. - Normal stomach. - A single duodenal polyp. Resected and retrieved. - Normal duodenum otherwise.  Diagnosis 1. Surgical [P], duodenal polyp - DUODENAL MUCOSA WITH PROMINENT BRUNNER'S GLANDS AND FOCAL FOVEOLAR METAPLASIA CONSISTENT WITH CHRONIC PEPTIC DUODENITIS. - NEGATIVE FOR DYSPLASIA. 2. Surgical [P], esophageal biopsy - SQUAMOUS MUCOSA WITHIN NORMAL LIMITS.  Colonoscopy 04/2022: - Perianal skin tags found on perianal exam. - The examined portion of the ileum was normal. - Post- polypectomy scar in the cecum with residual hemostasis clip. - One 3 to 4 mm polyp in the ascending colon, removed with a cold snare. Resected and retrieved. - One 6 mm polyp in the rectum, removed with a cold snare. Resected and retrieved. - Internal hemorrhoids. -  The examination was otherwise normal.  Diagnosis 1. Surgical [P], colon, ascending, polyp (1) - POLYPOID FRAGMENT OF BENIGN COLONIC MUCOSA WITH LYMPHOID AGGREGATE 2. Surgical [P], colon, rectum, polyp (1) - HYPERPLASTIC POLYP. - NO DYSPLASIA OR MALIGNANCY  Past Medical History:  Diagnosis Date   Afib (HCC)    Arthritis    psoratic   Asthma    "weak"    Constipation    Depression    Dysrhythmia    AFIb   GERD (gastroesophageal reflux disease)    History of hiatal hernia     Hyperaldosteronism (HCC)    Hypertension    Hypokalemia    Hypothyroidism    Insomnia    Migraine    ocassional   Nicotine dependence, cigarettes, uncomplicated    Pancreatitis    PONV (postoperative nausea and vomiting)    Pre-diabetes    Thyroid disease    hypothyroid    Past Surgical History:  Procedure Laterality Date   ANTERIOR CERVICAL DECOMPRESSION/DISCECTOMY FUSION 4 LEVELS N/A 07/20/2020   Procedure: ANTERIOR CERVICAL DECOMPRESSION FUSION CERVICAL FOUR-CERVICAL FIVE, CERVICAL FIVE-CERVICAL SIX, CERVICAL SIX-CERVICAL SEVEN;  Surgeon: Lisbeth Renshaw, MD;  Location: [facility] OR;  Service: Neurosurgery;  Laterality: N/A;   BIOPSY  06/28/2020   Procedure: BIOPSY;  Surgeon: Meridee Score Netty Starring., MD;  Location: [facility];  Service: Gastroenterology;;   CHOLECYSTECTOMY N/A 12/30/2020   Procedure: LAPAROSCOPIC CHOLECYSTECTOMY;  Surgeon: Andria Meuse, MD;  Location: [facility] ORS;  Service: General;  Laterality: N/A;   elbow surgery Right    ESOPHAGOGASTRODUODENOSCOPY (EGD) WITH PROPOFOL N/A 06/28/2020   Procedure: ESOPHAGOGASTRODUODENOSCOPY (EGD) WITH PROPOFOL;  Surgeon: Lemar Lofty., MD;  Location: [facility] ENDOSCOPY;  Service: Gastroenterology;  Laterality: N/A;   ESOPHAGOGASTRODUODENOSCOPY (EGD) WITH PROPOFOL N/A 08/05/2020   Procedure: ESOPHAGOGASTRODUODENOSCOPY (EGD) WITH PROPOFOL;  Surgeon: Meridee Score Netty Starring., MD;  Location: [facility] ENDOSCOPY;  Service: Gastroenterology;  Laterality: N/A;   EUS N/A 06/28/2020   Procedure: UPPER ENDOSCOPIC ULTRASOUND (EUS) LINEAR;  Surgeon: Lemar Lofty., MD;  Location: [facility] ENDOSCOPY;  Service: Gastroenterology;  Laterality: N/A;   EUS N/A 08/05/2020   Procedure: UPPER ENDOSCOPIC ULTRASOUND (EUS) RADIAL;  Surgeon: Lemar Lofty., MD;  Location: [facility] ENDOSCOPY;  Service: Gastroenterology;  Laterality: N/A;   FINE NEEDLE ASPIRATION  06/28/2020   Procedure: FINE NEEDLE ASPIRATION (FNA) LINEAR;  Surgeon: Lemar Lofty., MD;  Location: [facility] ENDOSCOPY;  Service: Gastroenterology;;   FINE NEEDLE ASPIRATION  08/05/2020   Procedure: FINE NEEDLE ASPIRATION;  Surgeon: Lemar Lofty., MD;  Location: [facility] ENDOSCOPY;  Service: Gastroenterology;;   POLYPECTOMY  06/28/2020   Procedure: POLYPECTOMY;  Surgeon: Lemar Lofty., MD;  Location: [facility] ENDOSCOPY;  Service: Gastroenterology;;   SHOULDER SURGERY Bilateral    inpingement release   TOTAL ABDOMINAL HYSTERECTOMY  2015   ULNAR NERVE REPAIR Right     Current Outpatient Medications  Medication Sig Dispense Refill   acetaminophen (TYLENOL) 325 MG tablet Take 2 tablets (650 mg total) by mouth every 6 (six) hours. (Patient taking differently: Take 650 mg by mouth every 6 (six) hours as needed.)     albuterol (VENTOLIN HFA) 108 (90 Base) MCG/ACT inhaler Inhale into the lungs every 6 (six) hours as needed for wheezing or shortness of breath.     aspirin EC 81 MG tablet Take 1 tablet (81 mg total) by mouth at bedtime. 30 tablet 11   baclofen (LIORESAL) 10 MG tablet Take 1 tablet (10 mg total) by mouth at bedtime. 90 tablet 0   Cholecalciferol (VITAMIN D)  50 MCG (2000 UT) CAPS Take 2,000 Units by mouth in the morning.     citalopram (CELEXA) 20 MG tablet Take 20 mg by mouth daily.     diltiazem (CARDIZEM) 30 MG tablet Take 1 tablet every 4 hours AS NEEDED for heart rate >100 as long as top blood pressure >100. 45 tablet 1   diphenhydrAMINE (BENADRYL) 25 MG tablet Take 100 mg by mouth as needed.     famotidine (PEPCID) 40 MG tablet Take 1 tablet (40 mg total) by mouth at bedtime. 30 tablet 2   fexofenadine (ALLEGRA) 180 MG tablet Take 180 mg by mouth in the morning.     gabapentin (NEURONTIN) 600 MG tablet Take 600 mg by mouth at bedtime.     HYDROMET 5-1.5 MG/5ML syrup Take 5 mLs by mouth 3 (three) times daily as needed.     levothyroxine (SYNTHROID) 175 MCG tablet Take 175 mcg by mouth daily before breakfast.     meloxicam (MOBIC) 15 MG tablet Take 15 mg by  mouth as needed.     montelukast (SINGULAIR) 10 MG tablet Take 10 mg by mouth at bedtime.     omeprazole (PRILOSEC) 40 MG capsule Take 1 capsule (40 mg total) by mouth 2 (two) times daily as needed. Please schedule a follow up appointment for further refills. 60 capsule 1   pramipexole (MIRAPEX) 0.5 MG tablet Take 0.5 mg by mouth at bedtime.     RESTASIS 0.05 % ophthalmic emulsion Place 1 drop into both eyes 2 (two) times daily.     sodium chloride (OCEAN) 0.65 % SOLN nasal spray Place 1 spray into both nostrils as needed for congestion.     spironolactone (ALDACTONE) 50 MG tablet 1 tablet     Tenapanor HCl (IBSRELA) 50 MG TABS Take 50 mg by mouth 2 (two) times daily before a meal. 60 tablet 2   traMADol (ULTRAM) 50 MG tablet 1 tablet as needed Orally Once a day     trazodone (DESYREL) 300 MG tablet Take 300 mg by mouth at bedtime.     No current facility-administered medications for this visit.    Allergies as of 06/29/2023   (No Known Allergies)    Family History  Problem Relation Age of Onset   Hypertension Mother    Thyroid disease Mother        hypothyroidism   Asthma Father    Heart disease Father        from scarlet fever; died age 58 MVA   Hypertension Sister    Hypothyroidism Sister    Other Maternal Aunt        fatty liver   Hypertension Maternal Grandmother    Heart failure Maternal Grandmother    Diabetes Paternal Grandmother    Heart failure Paternal Grandmother    Thyroid cancer Cousin    Colon cancer Neg Hx    Esophageal cancer Neg Hx    Pancreatic cancer Neg Hx    Stomach cancer Neg Hx    Breast cancer Neg Hx    Rectal cancer Neg Hx     Social History   Socioeconomic History   Marital status: Married    Spouse name: Not on file   Number of children: Not on file   Years of education: Not on file   Highest education level: Not on file  Occupational History   Not on file  Tobacco Use   Smoking status: Every Day    Current packs/day: 1.00    Average  packs/day: 1 pack/day for 52.3 years (52.3 ttl pk-yrs)    Types: Cigarettes    Start date: 03/12/1971   Smokeless tobacco: Never  Vaping Use   Vaping status: Never Used  Substance and Sexual Activity   Alcohol use: Not Currently   Drug use: Never   Sexual activity: Not on file  Other Topics Concern   Not on file  Social History Narrative   Not on file   Social Drivers of Health   Financial Resource Strain: Low Risk  (02/02/2020)   Received [facility]   Overall Financial Resource Strain (CARDIA)  Food Insecurity: No Food Insecurity (12/20/2021)   Hunger Vital Sign    Worried About Running Out of Food in the Last Year: Never true    Ran Out of Food in the Last Year: Never true  Transportation Needs: No Transportation Needs (12/20/2021)   PRAPARE - Administrator, Civil Service (Medical): No    Lack of Transportation (Non-Medical): No  Physical Activity: Insufficiently Active (02/02/2020)   Received from [facility]   Exercise Vital Sign  Stress: Stress Concern Present (02/02/2020)   Received from [facility] - Occupational Stress Questionnaire  Social Connections: Not on file  Intimate Partner Violence: Not on file    Review of Systems:    Constitutional: No weight loss, fever, chills, weakness or fatigue HEENT: Eyes: No change in vision               Ears, Nose, Throat:  No change in hearing or congestion Skin: No rash or itching Cardiovascular: No chest pain, chest pressure or palpitations   Respiratory: No SOB or cough Gastrointestinal: See HPI and otherwise negative Genitourinary: No dysuria or change in urinary frequency Neurological: No headache, dizziness or syncope Musculoskeletal: No new muscle or joint pain Hematologic: No bleeding or bruising Psychiatric: No history of depression or anxiety    Physical Exam:  Vital signs: BP 120/68   Pulse (!) 59    Ht 5\' 7"  (1.702 m)   Wt 191 lb (86.6 kg)   LMP  (LMP Unknown)   BMI 29.91 kg/m   Constitutional: NAD, Well developed, Well nourished, alert and cooperative Head:  Normocephalic and atraumatic. Eyes:   PEERL, EOMI. No icterus. Conjunctiva pink. Respiratory: Respirations even and unlabored. Lungs clear to auscultation bilaterally.   No wheezes, crackles, or rhonchi.  Cardiovascular:  Regular rate and rhythm. No peripheral edema, cyanosis or pallor.  Gastrointestinal:  Soft, nondistended, nontender. No rebound or guarding. Normal bowel sounds. No appreciable masses or hepatomegaly. Rectal:  Not performed.  Msk:  Symmetrical without gross deformities. Without edema, no deformity or joint abnormality.  Neurologic:  Alert and  oriented x4;  grossly normal neurologically.  Skin:   Dry and intact without significant lesions or rashes. Psychiatric: Oriented to person, place and time. Demonstrates good judgement and reason without abnormal affect or behaviors.   RELEVANT LABS AND IMAGING: CBC    Component Value Date/Time   WBC 10.7 (H) 01/04/2023 1117   RBC 5.10 01/04/2023 1117   HGB 14.7 01/04/2023 1117   HCT 44.5 01/04/2023 1117   PLT 236 01/04/2023 1117   MCV 87.3 01/04/2023 1117   MCH 28.8 01/04/2023 1117   MCHC 33.0 01/04/2023 1117   RDW 12.9 01/04/2023 1117   LYMPHSABS 1.7 01/03/2023 1539   MONOABS 1.0 01/03/2023 1539   EOSABS 0.2 01/03/2023 1539   BASOSABS 0.1  01/03/2023 1539    CMP     Component Value Date/Time   NA 134 (L) 01/04/2023 1117   K 3.6 01/04/2023 1117   CL 101 01/04/2023 1117   CO2 24 01/04/2023 1117   GLUCOSE 117 (H) 01/04/2023 1117   BUN 14 01/04/2023 1117   CREATININE 1.17 (H) 01/04/2023 1117   CALCIUM 9.3 01/04/2023 1117   PROT 6.6 01/04/2023 1117   ALBUMIN 3.4 (L) 01/04/2023 1117   AST 24 01/04/2023 1117   ALT 46 (H) 01/04/2023 1117   ALKPHOS 66 01/04/2023 1117   BILITOT 0.6 01/04/2023 1117   GFRNONAA 51 (L) 01/04/2023 1117      Assessment/Plan:      GERD Increased symptoms of burning in chest and throat despite Omeprazole 40mg  twice daily. Patient inquired about increasing dose to 80mg  twice daily, but this is not ideal. EGD 2023 and was deemed a TIF candidate at that time but this was not pursued. --Add Famotidine 40mg  at bedtime. --Consider consultation for Transoral Incisionless Fundoplication (TIF) procedure. Will discuss this further with Dr. Adela Lank. -- educated patient on lifestyle modifications.  Gastroparesis Patient reports feeling full and distended after meals, possibly due to slow gastric emptying versus GERD. History of diabetes. --Advise patient to eat 5-6 small meals throughout the day instead of large meals. -- provided gastroparesis education and patient handouts -- discussed reglan but with increased risks of side effects of tardive dyskinesia joints decision making was made to hold off for now and try life style modifications.  Irritable Bowel Syndrome with Constipation (IBS-C) Patient reports satisfactory control of symptoms with Ibsrela as needed, despite occasional leakage. -Continue current regimen.  Pancreatic cysts Extensive previous workup including EUS and MRCPs. See HPI. Due for surveillance repeat MRCP March 2025.   -- schedule MRCP    Lara Mulch [facility] 06/29/2023, 2:20 PM  Cc: Kirstie Peri, MD

## 2023-07-02 ENCOUNTER — Telehealth: Payer: Self-pay | Admitting: *Deleted

## 2023-07-02 DIAGNOSIS — R140 Abdominal distension (gaseous): Secondary | ICD-10-CM

## 2023-07-02 DIAGNOSIS — R6881 Early satiety: Secondary | ICD-10-CM

## 2023-07-02 DIAGNOSIS — K219 Gastro-esophageal reflux disease without esophagitis: Secondary | ICD-10-CM

## 2023-07-02 NOTE — Telephone Encounter (Signed)
 Attempted to reach patient x 2. Unfortunately, no answer with an eventual discontinuation of phone call prior to any voicemail options. Will reach out again at a later time.

## 2023-07-02 NOTE — Telephone Encounter (Signed)
===  View-only below this line=== ----- Message ----- From: Jearl Klinefelter Sent: 07/02/2023   9:01 AM EST To: Richardson Chiquito, RN  If patient is not doing well on added pepcid, we could schedule gastric emptying study (would need this before qualifying for TIF). Also, if she wants to avoid a TIF procedure and GERD isn't well controlled on omeprazole BID we could try another PPI like pantoprazole ----- Message ----- From: Benancio Deeds, MD Sent: 06/29/2023   5:45 PM EST To: Legrand Como, PA-C  Hey  If the patient is doing poorly with reflux could consider TIF evaluation, that would require repeat endoscopy, would need to be done within 1 year of considering TIF.  However, prior to doing that, if concern for gastroparesis she should have a gastric emptying study to see if that could be causing her refractory symptoms in light of her other upper tract symptoms that are bothering her.  If she had gastroparesis would not be inclined to do a TIF procedure.  If she does not want to do a gastric emptying study would more strongly consider empiric low-dose Reglan first.  I think reasonable to do a gastric emptying study given her symptoms.  Otherwise, can add Pepcid, however if having symptoms despite twice daily omeprazole we could switch to another PPI, cannot recall offhand what other regimen she has been on.  We could also try switching her to Voquenza 10 mg daily.

## 2023-07-03 ENCOUNTER — Ambulatory Visit (HOSPITAL_COMMUNITY)
Admission: RE | Admit: 2023-07-03 | Discharge: 2023-07-03 | Disposition: A | Discharge status: Home / Self Care | Payer: Medicare Other | Source: Ambulatory Visit | Attending: Gastroenterology | Admitting: Gastroenterology

## 2023-07-03 ENCOUNTER — Other Ambulatory Visit: Payer: Self-pay | Admitting: Gastroenterology

## 2023-07-03 DIAGNOSIS — K862 Cyst of pancreas: Secondary | ICD-10-CM | POA: Insufficient documentation

## 2023-07-03 MED ORDER — PANTOPRAZOLE SODIUM 40 MG PO TBEC: 40.0000 mg | DELAYED_RELEASE_TABLET | Freq: Two times a day (BID) | ORAL | 3 refills | Status: DC

## 2023-07-03 MED ORDER — GADOBUTROL 1 MMOL/ML IV SOLN
9.0000 mL | Freq: Once | INTRAVENOUS | Status: AC | PRN
  Administered 2023-07-03: 9 mL via INTRAVENOUS

## 2023-07-03 NOTE — Telephone Encounter (Signed)
 Pantoprazole Rx sent. Left message for patient to call back.

## 2023-07-03 NOTE — Telephone Encounter (Addendum)
 Discussed with patient Dr Venida Jarvis additional recommendations for testing/treatment following recent office visit. Patient states she is very interested in completing a gastric emptying study and would like to change PPI to pantoprazole. She is also advised that if considering TIF (pending normal gastric emptying study), she will need to have endoscopy completed within 1 year of surgery. She verbalizes understanding.  Patient has been scheduled for GES at [facility] on 07/11/23 at 8 am, 745 am arrival; NPO midnight.  Bayley, please provide sig for pantoprazole. 40 mg BID in place of omeprazole BID?

## 2023-07-04 ENCOUNTER — Telehealth: Payer: Self-pay | Admitting: Pharmacy Technician

## 2023-07-04 ENCOUNTER — Other Ambulatory Visit (HOSPITAL_COMMUNITY): Payer: Self-pay

## 2023-07-04 NOTE — Telephone Encounter (Signed)
 Left message for patient to call back

## 2023-07-04 NOTE — Telephone Encounter (Signed)
 Pharmacy Patient Advocate Encounter   Received notification from CoverMyMeds that prior authorization for [facility] 50MG  is required/requested.   Insurance verification completed.   The patient is insured through [employer] .   Per test claim: PA required; PA submitted to above mentioned insurance via CoverMyMeds Key/confirmation #/EOC [id number] Status is pending

## 2023-07-05 ENCOUNTER — Other Ambulatory Visit (HOSPITAL_COMMUNITY): Payer: Self-pay

## 2023-07-05 NOTE — Telephone Encounter (Signed)
 Pharmacy Patient Advocate Encounter  Received notification from [employer] that Prior Authorization for [facility] 50MG  has been APPROVED from 1.19.25 to 2.18.26. Ran test claim, Copay is $1111.29. This test claim was processed through [facility]- copay amounts may vary at other pharmacies due to pharmacy/plan contracts, or as the patient moves through the different stages of their insurance plan.   PA #/Case ID/Reference #: [id number]

## 2023-07-05 NOTE — Telephone Encounter (Signed)
 Patient has been advised of gastric emptying scan time/date/location/prep. She verbalizes understanding.

## 2023-07-06 NOTE — Telephone Encounter (Signed)
 PA request has been Approved. New Encounter created for follow up. For additional info see Pharmacy Prior Auth telephone encounter from 02/19.

## 2023-07-09 ENCOUNTER — Other Ambulatory Visit: Payer: Self-pay | Admitting: Gastroenterology

## 2023-07-09 ENCOUNTER — Encounter: Payer: Self-pay | Admitting: Gastroenterology

## 2023-07-09 ENCOUNTER — Telehealth: Payer: Self-pay | Admitting: Gastroenterology

## 2023-07-09 NOTE — Telephone Encounter (Signed)
 Called and left detailed message for patient to please call back to discuss refill request for Baclofen.  Is she still taking it? Is it helping?  Is she requesting a refill?

## 2023-07-09 NOTE — Telephone Encounter (Signed)
 Called [facility] at Phone: [phone].   Copay is $1100 Patient Assistance was approved today. They will reach out to the patient.

## 2023-07-09 NOTE — Telephone Encounter (Signed)
 Patient called back and LM.  See other phone note.  Called patient and left message again asking if she is still taking Baclofen and if she is requesting a refill request from [facility]. Note: Dr. Adela Lank started her on baclofen 10 mg at bedtime for nocturnal reflux  in 01-2022 OV. However, at the last OV with [facility] on 06-29-23 it was not mentioned if she was still using it and if it was to be continued.

## 2023-07-09 NOTE — Telephone Encounter (Signed)
 Inbound call from patient stating she received a voicemail regarding current medication and if she would like a refill. Patient unsure who left message. Requesting a call back. Please advise, thank you.

## 2023-07-09 NOTE — Telephone Encounter (Signed)
 See refill request for baclofen

## 2023-07-09 NOTE — Telephone Encounter (Signed)
 Inbound call from [employer] assist requesting update of PA. States they will contact patient for patient assistance.

## 2023-07-10 MED ORDER — BACLOFEN 10 MG PO TABS
10.0000 mg | ORAL_TABLET | Freq: Every day | ORAL | 3 refills | Status: AC
Start: 2023-07-10 — End: ?

## 2023-07-10 NOTE — Telephone Encounter (Signed)
 Called and spoke to patient.  She Has been taking baclofen at bedtime. She did not discuss this with Bayley at your last visit. She said she doesn't know if it helps but she is afraid to stop taking it.  Ok to refill? Please advise

## 2023-07-11 ENCOUNTER — Ambulatory Visit (HOSPITAL_COMMUNITY)
Admission: RE | Admit: 2023-07-11 | Discharge: 2023-07-11 | Disposition: A | Discharge status: Home / Self Care | Payer: Medicare Other | Source: Ambulatory Visit | Attending: Gastroenterology | Admitting: Gastroenterology

## 2023-07-11 DIAGNOSIS — R6881 Early satiety: Secondary | ICD-10-CM | POA: Insufficient documentation

## 2023-07-11 DIAGNOSIS — R140 Abdominal distension (gaseous): Secondary | ICD-10-CM | POA: Diagnosis present

## 2023-07-11 DIAGNOSIS — K219 Gastro-esophageal reflux disease without esophagitis: Secondary | ICD-10-CM | POA: Insufficient documentation

## 2023-07-11 MED ORDER — TECHNETIUM TC 99M SULFUR COLLOID
2.0000 | Freq: Once | INTRAVENOUS | Status: AC | PRN
  Administered 2023-07-11: 2 via INTRAVENOUS

## 2023-08-01 ENCOUNTER — Encounter: Payer: Self-pay | Admitting: Cardiology

## 2023-08-01 ENCOUNTER — Ambulatory Visit: Attending: Cardiology | Admitting: Cardiology

## 2023-08-01 VITALS — BP 134/80 | HR 80 | Ht 67.0 in | Wt 189.8 lb

## 2023-08-01 DIAGNOSIS — I480 Paroxysmal atrial fibrillation: Secondary | ICD-10-CM | POA: Diagnosis present

## 2023-08-01 DIAGNOSIS — I1A0 Resistant hypertension: Secondary | ICD-10-CM

## 2023-08-01 DIAGNOSIS — I351 Nonrheumatic aortic (valve) insufficiency: Secondary | ICD-10-CM | POA: Diagnosis present

## 2023-08-01 NOTE — Progress Notes (Signed)
 Clinical Summary Stephanie Ewing is a 67 y.o.female seen today for follow up of the following medical problems.    Previously followed by Stephanie Stephanie Ewing at [facility]     1. PAF - beta blocker stopped by endocrinology given bp imroved on aldactone alone - she reports 2 episodes of afib in setting of hypokalemia. From chart review appears K was low to mid 2 range both times.   - 02/2017 event monitor without afib/aflutter, did have some PACs/PVCs, short runs SVT.  - anticoag was stopped due to isolated events with no clear recurrence        06/2020 event monitor SR and rare PVCs, no afib or aflutter - occasional skipped beat, mild pressure associated. Typically at rest, lasts just a few seconds - no caffeine, no EtoH      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 Valvular heart disease - noted by echo 2018  -  10/2019 echo LVEF 60-65%, mod AI - 12/2021 echo: LVEF 60-65%, mild MR, mild AI   - denies any symptoms    5. 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 DM2 -started on statin by endocrinology     Prior caridologt notes  01/18/2017 Echo showed mild aortic valve sclerosis with mild to moderate aortic regurgitation otherwise no significant structural heart disease present. Lexiscan nuclear stress test was normal. 02/05/2017 nuclear stress test: Lexiscan nuclear stress tes tNo significant EKG changes. Perfusion scan is normal with a normal ejection fraction and no ischemia. This is considered low risk result     Retired respiratory therapist Past Medical History:  Diagnosis Date    Afib (HCC)    Arthritis    psoratic   Asthma    "weak"    Constipation    Depression    Dysrhythmia    AFIb   GERD (gastroesophageal reflux disease)    History of hiatal hernia    Hyperaldosteronism (HCC)    Hypertension    Hypokalemia    Hypothyroidism    Insomnia    Migraine    ocassional   Nicotine dependence, cigarettes, uncomplicated    Pancreatitis    PONV (postoperative nausea and vomiting)    Pre-diabetes    Thyroid disease    hypothyroid     No Known Allergies   Current Outpatient Medications  Medication Sig Dispense Refill   acetaminophen (TYLENOL) 325 MG tablet Take 2 tablets (650 mg total) by mouth every 6 (six) hours. (Patient taking differently: Take 650 mg by mouth every 6 (six) hours as needed.)     albuterol (VENTOLIN HFA) 108 (90 Base) MCG/ACT inhaler Inhale into the lungs every 6 (six) hours as needed for wheezing or shortness of breath.     aspirin EC 81 MG tablet Take 1 tablet (81 mg total) by mouth at bedtime. 30 tablet 11   baclofen (LIORESAL) 10 MG tablet Take 1 tablet (10 mg total) by mouth at bedtime. 90 tablet 3   Cholecalciferol (VITAMIN D) 50 MCG (2000 UT) CAPS Take 2,000 Units by mouth in the morning.  citalopram (CELEXA) 20 MG tablet Take 20 mg by mouth daily.     diltiazem (CARDIZEM) 30 MG tablet Take 1 tablet every 4 hours AS NEEDED for heart rate >100 as long as top blood pressure >100. 45 tablet 1   diphenhydrAMINE (BENADRYL) 25 MG tablet Take 100 mg by mouth as needed.     famotidine (PEPCID) 40 MG tablet Take 1 tablet (40 mg total) by mouth at bedtime. 30 tablet 2   fexofenadine (ALLEGRA) 180 MG tablet Take 180 mg by mouth in the morning.     gabapentin (NEURONTIN) 600 MG tablet Take 600 mg by mouth at bedtime.     HYDROMET 5-1.5 MG/5ML syrup Take 5 mLs by mouth 3 (three) times daily as needed.     levothyroxine (SYNTHROID) 175 MCG tablet Take 175 mcg by mouth daily before breakfast.     meloxicam (MOBIC) 15 MG tablet Take 15 mg by  mouth as needed.     montelukast (SINGULAIR) 10 MG tablet Take 10 mg by mouth at bedtime.     pantoprazole (PROTONIX) 40 MG tablet Take 1 tablet (40 mg total) by mouth 2 (two) times daily before a meal. Pharmacy-please d/c rx for omeprazole 60 tablet 3   pramipexole (MIRAPEX) 0.5 MG tablet Take 0.5 mg by mouth at bedtime.     RESTASIS 0.05 % ophthalmic emulsion Place 1 drop into both eyes 2 (two) times daily.     sodium chloride (OCEAN) 0.65 % SOLN nasal spray Place 1 spray into both nostrils as needed for congestion.     spironolactone (ALDACTONE) 50 MG tablet 1 tablet     Tenapanor HCl (IBSRELA) 50 MG TABS Take 50 mg by mouth 2 (two) times daily before a meal. 60 tablet 2   traMADol (ULTRAM) 50 MG tablet 1 tablet as needed Orally Once a day     trazodone (DESYREL) 300 MG tablet Take 300 mg by mouth at bedtime.     No current facility-administered medications for this visit.     Past Surgical History:  Procedure Laterality Date   ANTERIOR CERVICAL DECOMPRESSION/DISCECTOMY FUSION 4 LEVELS N/A 07/20/2020   Procedure: ANTERIOR CERVICAL DECOMPRESSION FUSION CERVICAL FOUR-CERVICAL FIVE, CERVICAL FIVE-CERVICAL SIX, CERVICAL SIX-CERVICAL SEVEN;  Surgeon: Lisbeth Renshaw, Stephanie Ewing;  Location: [facility] OR;  Service: Neurosurgery;  Laterality: N/A;   BIOPSY  06/28/2020   Procedure: BIOPSY;  Surgeon: Meridee Score Netty Starring., Stephanie Ewing;  Location: [facility];  Service: Gastroenterology;;   CHOLECYSTECTOMY N/A 12/30/2020   Procedure: LAPAROSCOPIC CHOLECYSTECTOMY;  Surgeon: Andria Meuse, Stephanie Ewing;  Location: [facility] ORS;  Service: General;  Laterality: N/A;   elbow surgery Right    ESOPHAGOGASTRODUODENOSCOPY (EGD) WITH PROPOFOL N/A 06/28/2020   Procedure: ESOPHAGOGASTRODUODENOSCOPY (EGD) WITH PROPOFOL;  Surgeon: Lemar Lofty., Stephanie Ewing;  Location: [facility] ENDOSCOPY;  Service: Gastroenterology;  Laterality: N/A;   ESOPHAGOGASTRODUODENOSCOPY (EGD) WITH PROPOFOL N/A 08/05/2020   Procedure: ESOPHAGOGASTRODUODENOSCOPY (EGD)  WITH PROPOFOL;  Surgeon: Meridee Score Netty Starring., Stephanie Ewing;  Location: [facility] ENDOSCOPY;  Service: Gastroenterology;  Laterality: N/A;   EUS N/A 06/28/2020   Procedure: UPPER ENDOSCOPIC ULTRASOUND (EUS) LINEAR;  Surgeon: Lemar Lofty., Stephanie Ewing;  Location: [facility] END[facility]COPY;  Service: Gastroenterology;  Laterality: N/A;   EUS N/A 08/05/2020   Procedure: UPPER ENDOSCOPIC ULTRASOUND (EUS) RADIAL;  Surgeon: Lemar Lofty., Stephanie Ewing;  Location: [facility] ENDOSCOPY;  Service: Gastroenterology;  Laterality: N/A;   FINE NEEDLE ASPIRATION  06/28/2020   Procedure: FINE NEEDLE ASPIRATION (FNA) LINEAR;  Surgeon: Lemar Lofty., Stephanie Ewing;  Location: [facility] ENDOSCOPY;  Service: Gastroenterology;;  FINE NEEDLE ASPIRATION  08/05/2020   Procedure: FINE NEEDLE ASPIRATION;  Surgeon: Meridee Score Netty Starring., Stephanie Ewing;  Location: [facility] ENDOSCOPY;  Service: Gastroenterology;;   POLYPECTOMY  06/28/2020   Procedure: POLYPECTOMY;  Surgeon: Lemar Lofty., Stephanie Ewing;  Location: [facility] ENDOSCOPY;  Service: Gastroenterology;;   SHOULDER SURGERY Bilateral    inpingement release   TOTAL ABDOMINAL HYSTERECTOMY  2015   ULNAR NERVE REPAIR Right      No Known Allergies    Family History  Problem Relation Age of Onset   Hypertension Mother    Thyroid disease Mother        hypothyroidism   Asthma Father    Heart disease Father        from scarlet fever; died age 44 MVA   Hypertension Sister    Hypothyroidism Sister    Other Maternal Aunt        fatty liver   Hypertension Maternal Grandmother    Heart failure Maternal Grandmother    Diabetes Paternal Grandmother    Heart failure Paternal Grandmother    Thyroid cancer Cousin    Colon cancer Neg Hx    Esophageal cancer Neg Hx    Pancreatic cancer Neg Hx    Stomach cancer Neg Hx    Breast cancer Neg Hx    Rectal cancer Neg Hx      Social History Ms. Mini reports that she has been smoking cigarettes. She started smoking about 52 years ago. She has a 52.4 pack-year smoking  history. She has never used smokeless tobacco. Ms. Tenenbaum reports that she does not currently use alcohol.    Physical Examination Today's Vitals   08/01/23 1428  BP: 134/80  Pulse: 80  SpO2: 96%  Weight: 189 lb 12.8 oz (86.1 kg)  Height: 5\' 7"  (1.702 m)   Body mass index is 29.73 kg/m.  Gen: resting comfortably, no acute distress HEENT: no scleral icterus, pupils equal round and reactive, no palptable cervical adenopathy,  CV: RRR, no mrg, no jvd Resp: Clear to auscultation bilaterally GI: abdomen is soft, non-tender, non-distended, normal bowel sounds, no hepatosplenomegaly MSK: extremities are warm, no edema.  Skin: warm, no rash Neuro:  no focal deficits Psych: appropriate affect   Diagnostic Studies 01/2017 Nuclear perfusion stress test: 1. Normal myocardial perfusion imaging without ischemia or infarction. 2. Normal post-stress LVEF, 55%. Normal regional wall motion. TID 0.93. 3. Patient achieved 10 METs by Bruce protocol without chest pain or EKG changes but could not reach heart rate target. With vasodilator administration, no EKG changes. Patient reported chest pain with vasodilator, a non-specific finding. 4. Hypertensive response to exercise. 5. No prior study available for comparison. Electronically signed Stephanie Earlene Plater, Stephanie Ewing 02/06/17 9:35 AM  01/2017 Echocardiogram: Mild sclerosis of the trileaflet aortic valve, with adequate leaflet motion. Mild-to-moderate aortic regurgitation is present. The left atrial size is normal. There is normal left ventricular size. Global left ventricular systolic function is normal. The estimated left ventricular ejection fraction is 60% Normal left ventricular diastolic filling pattern. Normal right ventricle size and function. Normal RVSP ---------------------------------------------------------------- Electronically signed by Stephanie Ewing Stephanie physician) on 01/18/2017 06:58  PM ----------------------------------------------------------------   02/2017 Thirty day event monitor: 1. Ventricular arrhythmias occasional PVCs with no couplets triplets or runs of ventricular tachycardia 2. Supraventricular arrhythmias occasional PACs with occasional short runs of nonsustained SVT 3. Atrial fibrillation/atrial flutter none 4. Bradycardia arrhythmias no significant Brady arrhythmias 5. Symptom correlation with arrhythmias patient's symptoms did occur with atrial and ventricular ectopy and short runs  of nonsustained SVT  Impressions: Occasional PVCs with no high-grade ventricular ectopy Occasional PACs with occasional brief short runs of nonsustained SVT Symptoms occurred with PACs and PVCs and short runs of nonsustained SVT Overall no hemodynamically significant arrhythmias present  Electronically signed by: Stephanie Lindy, Stephanie Ewing, Stephanie Ewing       Jan 2022 CTA IMPRESSION: 1. No aortic dissection or acute aortic abnormality. No pulmonary embolus. 2. Minor distal esophageal wall thickening, can be seen with reflux or esophagitis. 3. Improved peripancreatic fat stranding from last month. 4. Moderate colonic stool burden with tortuous sigmoid colon, suggesting constipation.    12/2021 echo 1. Left ventricular ejection fraction, by estimation, is 60 to 65%. The  left ventricle has normal function. The left ventricle has no regional  wall motion abnormalities. Left ventricular diastolic parameters are  indeterminate. The average left  ventricular global longitudinal strain is -18.6 %. The global longitudinal  strain is normal.   2. Right ventricular systolic function is normal. The right ventricular  size is normal.   3. The mitral valve is abnormal. Mild mitral valve regurgitation. No  evidence of mitral stenosis.   4. The aortic valve is tricuspid. There is mild calcification of the  aortic valve. There is mild thickening of the aortic valve. Aortic valve   regurgitation is mild.   5. The inferior vena cava is normal in size with greater than 50%  respiratory variability, suggesting right atrial pressure of 3 mmHg.         Assessment and Plan  1. PAF - 2 isolated episodes of afib in setting of severe hypokalemia. No clear recurence, including by a 30 day event monitor by her previous cardiologist and a repeat monitor here in 06/2020 - anticoag was discontinued.    - mild short palpitations consistent with her prior ectopy, at this time not bothersome where she would like to start a regular av nodal agent - continue to monitor   2. Resistant HTN - significnatly improved on aldactone, she was diagnosed with hyperaldo -aldo dosing per endocrine - her bp is at goal   3. Aortic regurgitation -AI mild to moderate range over serial studies - would repeat echo after our next appt later this year   F/u 6 months   Stephanie Ewing, Stephanie Ewing

## 2023-08-01 NOTE — Patient Instructions (Signed)
Medication Instructions:  Your physician recommends that you continue on your current medications as directed. Please refer to the Current Medication list given to you today.  *If you need a refill on your cardiac medications before your next appointment, please call your pharmacy*   Lab Work: None If you have labs (blood work) drawn today and your tests are completely normal, you will receive your results only by: MyChart Message (if you have MyChart) OR A paper copy in the mail If you have any lab test that is abnormal or we need to change your treatment, we will call you to review the results.   Testing/Procedures: None   Follow-Up: At [facility], you and your health needs are our priority.  As part of our continuing mission to provide you with exceptional heart care, we have created designated Provider Care Teams.  These Care Teams include your primary Cardiologist (physician) and Advanced Practice Providers (APPs -  Physician Assistants and Nurse Practitioners) who all work together to provide you with the care you need, when you need it.  We recommend signing up for the patient portal called "MyChart".  Sign up information is provided on this After Visit Summary.  MyChart is used to connect with patients for Virtual Visits (Telemedicine).  Patients are able to view lab/test results, encounter notes, upcoming appointments, etc.  Non-urgent messages can be sent to your provider as well.   To learn more about what you can do with MyChart, go to [email or web address].    Your next appointment:   6 month(s)  Provider:   You may see Dina Rich, MD or the following Advanced Practice Provider on your designated Care Team:   Sharlene Dory, NP    Other Instructions

## 2023-09-24 ENCOUNTER — Other Ambulatory Visit: Payer: Self-pay | Admitting: Gastroenterology

## 2024-01-07 ENCOUNTER — Other Ambulatory Visit: Payer: Self-pay | Admitting: Gastroenterology

## 2024-03-26 ENCOUNTER — Ambulatory Visit: Attending: Cardiology | Admitting: Cardiology

## 2024-03-26 ENCOUNTER — Encounter: Payer: Self-pay | Admitting: Cardiology

## 2024-03-26 VITALS — BP 116/70 | HR 78 | Ht 67.0 in | Wt 169.4 lb

## 2024-03-26 DIAGNOSIS — I480 Paroxysmal atrial fibrillation: Secondary | ICD-10-CM | POA: Insufficient documentation

## 2024-03-26 DIAGNOSIS — I351 Nonrheumatic aortic (valve) insufficiency: Secondary | ICD-10-CM | POA: Insufficient documentation

## 2024-03-26 NOTE — Progress Notes (Signed)
 Clinical Summary Stephanie Ewing is a 68 y.o.female seen today for follow up of the following medical problems.    Previously followed by Dr Lynwood Min at [facility]      1. PAF - beta blocker stopped by endocrinology given bp imroved on aldactone  alone - she reports 2 episodes of afib in setting of hypokalemia. From chart review appears K was low to mid 2 range both times.   - 02/2017 event monitor without afib/aflutter, did have some PACs/PVCs, short runs SVT.  - anticoag was stopped due to isolated events with no clear recurrence     06/2020 event monitor SR and rare PVCs, no afib or aflutter  -no recent palpitations - compliant with meds       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 Valvular heart disease - noted by echo 2018  -  10/2019 echo LVEF 60-65%, mod AI - 12/2021 echo: LVEF 60-65%, mild MR, mild AI   - no SOB/DOE, no LE edema    5. 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 DM2 -started on statin by endocrinology     Prior caridologt notes  01/18/2017 Echo showed mild aortic valve sclerosis with mild to moderate aortic regurgitation otherwise no significant structural heart disease present. Lexiscan nuclear stress test was normal. 02/05/2017 nuclear stress test: Lexiscan nuclear stress tes tNo significant EKG changes. Perfusion scan is normal with a normal ejection fraction and no ischemia. This is considered low risk result   Past Medical History:  Diagnosis Date   Afib (HCC)    Arthritis    psoratic   Asthma    weak     Constipation    Depression    Dysrhythmia    AFIb   GERD (gastroesophageal reflux disease)    History of hiatal hernia    Hyperaldosteronism    Hypertension    Hypokalemia    Hypothyroidism    Insomnia    Migraine    ocassional   Nicotine  dependence, cigarettes, uncomplicated    Pancreatitis    PONV (postoperative nausea and vomiting)    Pre-diabetes    Thyroid disease    hypothyroid     Allergies  Allergen Reactions   Semaglutide Other (See Comments)    Pancreatitis with Lipase of 499.     Current Outpatient Medications  Medication Sig Dispense Refill   acetaminophen  (TYLENOL ) 325 MG tablet Take 2 tablets (650 mg total) by mouth every 6 (six) hours. (Patient taking differently: Take 650 mg by mouth every 6 (six) hours as needed.)     albuterol  (VENTOLIN  HFA) 108 (90 Base) MCG/ACT inhaler Inhale into the lungs every 6 (six) hours as needed for wheezing or shortness of breath.     ALPRAZolam (XANAX) 0.5 MG tablet Take 0.5 mg by mouth 3 (three) times daily as needed.     aspirin  EC 81 MG tablet Take 1 tablet (81 mg total) by mouth at bedtime. 30 tablet 11   baclofen  (LIORESAL ) 10 MG tablet Take 1 tablet (10  mg total) by mouth at bedtime. 90 tablet 3   Cholecalciferol (VITAMIN D) 50 MCG (2000 UT) CAPS Take 2,000 Units by mouth in the morning.     citalopram  (CELEXA ) 40 MG tablet Take 40 mg by mouth daily.     Cyanocobalamin (B-12) 5000 MCG SUBL Take 1 tablet by mouth daily.     diltiazem  (CARDIZEM ) 30 MG tablet Take 1 tablet every 4 hours AS NEEDED for heart rate >100 as long as top blood pressure >100. 45 tablet 1   famotidine  (PEPCID ) 40 MG tablet TAKE 1 TABLET(40 MG) BY MOUTH AT BEDTIME 30 tablet 2   fexofenadine (ALLEGRA) 180 MG tablet Take 180 mg by mouth in the morning.     gabapentin  (NEURONTIN ) 600 MG tablet Take 600 mg by mouth at bedtime.     HYDROMET 5-1.5 MG/5ML syrup Take 5 mLs by mouth 3 (three) times daily as needed.     JARDIANCE 10 MG TABS tablet Take 10 mg  by mouth daily.     levothyroxine  (SYNTHROID ) 175 MCG tablet Take 175 mcg by mouth daily before breakfast.     meloxicam (MOBIC) 15 MG tablet Take 15 mg by mouth daily as needed.     montelukast  (SINGULAIR ) 10 MG tablet Take 10 mg by mouth at bedtime.     pantoprazole  (PROTONIX ) 40 MG tablet TAKE 1 TABLET BY MOUTH TWICE DAILY BEFORE A MEAL 60 tablet 3   pramipexole (MIRAPEX) 0.5 MG tablet Take 0.5 mg by mouth at bedtime.     QUEtiapine (SEROQUEL) 50 MG tablet Take 50 mg by mouth at bedtime.     RESTASIS  0.05 % ophthalmic emulsion Place 1 drop into both eyes 2 (two) times daily.     rosuvastatin (CRESTOR) 5 MG tablet Take 5 mg by mouth daily.     sodium chloride  (OCEAN) 0.65 % SOLN nasal spray Place 1 spray into both nostrils as needed for congestion.     spironolactone  (ALDACTONE ) 50 MG tablet Take 50 mg by mouth daily.     Tenapanor HCl (IBSRELA ) 50 MG TABS Take 50 mg by mouth 2 (two) times daily before a meal. 60 tablet 2   traMADol  (ULTRAM ) 50 MG tablet Take 50 mg by mouth every 12 (twelve) hours as needed.     No current facility-administered medications for this visit.     Past Surgical History:  Procedure Laterality Date   ANTERIOR CERVICAL DECOMPRESSION/DISCECTOMY FUSION 4 LEVELS N/A 07/20/2020   Procedure: ANTERIOR CERVICAL DECOMPRESSION FUSION CERVICAL FOUR-CERVICAL FIVE, CERVICAL FIVE-CERVICAL SIX, CERVICAL SIX-CERVICAL SEVEN;  Surgeon: Lanis Pupa, MD;  Location: [facility];  Service: Neurosurgery;  Laterality: N/A;   BIOPSY  06/28/2020   Procedure: BIOPSY;  Surgeon: Wilhelmenia Aloha Raddle., MD;  Location: [facility];  Service: Gastroenterology;;   CHOLECYSTECTOMY N/A 12/30/2020   Procedure: LAPAROSCOPIC CHOLECYSTECTOMY;  Surgeon: Teresa Lonni HERO, MD;  Location: [facility];  Service: General;  Laterality: N/A;   elbow surgery Right    ESOPHAGOGASTRODUODENOSCOPY (EGD) WITH PROPOFOL  N/A 06/28/2020   Procedure: ESOPHAGOGASTRODUODENOSCOPY (EGD) WITH PROPOFOL ;  Surgeon:  Wilhelmenia Aloha Raddle., MD;  Location: [facility];  Service: Gastroenterology;  Laterality: N/A;   ESOPHAGOGASTRODUODENOSCOPY (EGD) WITH PROPOFOL  N/A 08/05/2020   Procedure: ESOPHAGOGASTRODUODENOSCOPY (EGD) WITH PROPOFOL ;  Surgeon: Wilhelmenia Aloha Raddle., MD;  Location: [facility];  Service: Gastroenterology;  Laterality: N/A;   EUS N/A 06/28/2020   Procedure: UPPER ENDOSCOPIC ULTRASOUND (EUS) LINEAR;  Surgeon: Wilhelmenia Aloha Raddle., MD;  Location: [facility];  Service: Gastroenterology;  Laterality: N/A;   EUS  N/A 08/05/2020   Procedure: UPPER ENDOSCOPIC ULTRASOUND (EUS) RADIAL;  Surgeon: Wilhelmenia Aloha Raddle., MD;  Location: [facility];  Service: Gastroenterology;  Laterality: N/A;   FINE NEEDLE ASPIRATION  06/28/2020   Procedure: FINE NEEDLE ASPIRATION (FNA) LINEAR;  Surgeon: Wilhelmenia Aloha Raddle., MD;  Location: [facility];  Service: Gastroenterology;;   FINE NEEDLE ASPIRATION  08/05/2020   Procedure: FINE NEEDLE ASPIRATION;  Surgeon: Wilhelmenia Aloha Raddle., MD;  Location: [facility];  Service: Gastroenterology;;   POLYPECTOMY  06/28/2020   Procedure: POLYPECTOMY;  Surgeon: Wilhelmenia Aloha Raddle., MD;  Location: [facility];  Service: Gastroenterology;;   SHOULDER SURGERY Bilateral    inpingement release   TOTAL ABDOMINAL HYSTERECTOMY  2015   ULNAR NERVE REPAIR Right      Allergies  Allergen Reactions   Semaglutide Other (See Comments)    Pancreatitis with Lipase of 499.      Family History  Problem Relation Age of Onset   Hypertension Mother    Thyroid disease Mother        hypothyroidism   Asthma Father    Heart disease Father        from scarlet fever; died age 51 MVA   Hypertension Sister    Hypothyroidism Sister    Other Maternal Aunt        fatty liver   Hypertension Maternal Grandmother    Heart failure Maternal Grandmother    Diabetes Paternal Grandmother    Heart failure Paternal Grandmother    Thyroid cancer Cousin    Colon cancer Neg  Hx    Esophageal cancer Neg Hx    Pancreatic cancer Neg Hx    Stomach cancer Neg Hx    Breast cancer Neg Hx    Rectal cancer Neg Hx      Social History Stephanie Ewing reports that she has been smoking cigarettes. She started smoking about 53 years ago. She has a 53 pack-year smoking history. She has never used smokeless tobacco. Stephanie Ewing reports that she does not currently use alcohol.    Physical Examination Today's Vitals   03/26/24 0810  BP: 116/70  Pulse: 78  SpO2: 98%  Weight: 169 lb 6.4 oz (76.8 kg)  Height: 5' 7 (1.702 m)   Body mass index is 26.53 kg/m.  Gen: resting comfortably, no acute distress HEENT: no scleral icterus, pupils equal round and reactive, no palptable cervical adenopathy,  CV: RRR, no m/r,g no jvd Resp: Clear to auscultation bilaterally GI: abdomen is soft, non-tender, non-distended, normal bowel sounds, no hepatosplenomegaly MSK: extremities are warm, no edema.  Skin: warm, no rash Neuro:  no focal deficits Psych: appropriate affect   Diagnostic Studies   01/2017 Nuclear perfusion stress test: 1. Normal myocardial perfusion imaging without ischemia or infarction. 2. Normal post-stress LVEF, 55%. Normal regional wall motion. TID 0.93. 3. Patient achieved 10 METs by Bruce protocol without chest pain or EKG changes but could not reach heart rate target. With vasodilator administration, no EKG changes. Patient reported chest pain with vasodilator, a non-specific finding. 4. Hypertensive response to exercise. 5. No prior study available for comparison. Electronically signed Dr Luetta Doing, MD 02/06/17 9:35 AM  01/2017 Echocardiogram: Mild sclerosis of the trileaflet aortic valve, with adequate leaflet motion. Mild-to-moderate aortic regurgitation is present. The left atrial size is normal. There is normal left ventricular size. Global left ventricular systolic function is normal. The estimated left ventricular ejection fraction is  60% Normal left ventricular diastolic filling pattern. Normal right ventricle size and function. Normal RVSP ----------------------------------------------------------------  Electronically signed by MCCRISKIN JAMES W(Interpreting physician) on 01/18/2017 06:58 PM ----------------------------------------------------------------   02/2017 Thirty day event monitor: 1. Ventricular arrhythmias occasional PVCs with no couplets triplets or runs of ventricular tachycardia 2. Supraventricular arrhythmias occasional PACs with occasional short runs of nonsustained SVT 3. Atrial fibrillation/atrial flutter none 4. Bradycardia arrhythmias no significant Brady arrhythmias 5. Symptom correlation with arrhythmias patient's symptoms did occur with atrial and ventricular ectopy and short runs of nonsustained SVT  Impressions: Occasional PVCs with no high-grade ventricular ectopy Occasional PACs with occasional brief short runs of nonsustained SVT Symptoms occurred with PACs and PVCs and short runs of nonsustained SVT Overall no hemodynamically significant arrhythmias present  Electronically signed by: Lynwood MICAEL Min, DO, [facility]       Jan 2022 CTA IMPRESSION: 1. No aortic dissection or acute aortic abnormality. No pulmonary embolus. 2. Minor distal esophageal wall thickening, can be seen with reflux or esophagitis. 3. Improved peripancreatic fat stranding from last month. 4. Moderate colonic stool burden with tortuous sigmoid colon, suggesting constipation.    12/2021 echo 1. Left ventricular ejection fraction, by estimation, is 60 to 65%. The  left ventricle has normal function. The left ventricle has no regional  wall motion abnormalities. Left ventricular diastolic parameters are  indeterminate. The average left  ventricular global longitudinal strain is -18.6 %. The global longitudinal  strain is normal.   2. Right ventricular systolic function is normal. The right ventricular  size is  normal.   3. The mitral valve is abnormal. Mild mitral valve regurgitation. No  evidence of mitral stenosis.   4. The aortic valve is tricuspid. There is mild calcification of the  aortic valve. There is mild thickening of the aortic valve. Aortic valve  regurgitation is mild.   5. The inferior vena cava is normal in size with greater than 50%  respiratory variability, suggesting right atrial pressure of 3 mmHg.         Assessment and Plan  1. PAF - 2 isolated episodes of afib in setting of severe hypokalemia. No clear recurence, including by a 30 day event monitor by her previous cardiologist and a repeat monitor here in 06/2020 - anticoag was discontinued.  - no symptoms - EKG today shows NSR, continue to monitor   2. Resistant HTN - significnatly improved on aldactone , she was diagnosed with hyperaldo and now followed by endo at [facility] -aldo dosing per endocrine -bp remains well controlled   3. Aortic regurgitation -AI mild to moderate range over serial studies - no symptoms - we will repeat echo for ongoing surveillance   F/u 1 year   Dorn PHEBE Ross, M.D.

## 2024-03-26 NOTE — Patient Instructions (Addendum)
 Medication Instructions:  Your physician recommends that you continue on your current medications as directed. Please refer to the Current Medication list given to you today.   Labwork: None  Testing/Procedures: Your physician has requested that you have an echocardiogram. Echocardiography is a painless test that uses sound waves to create images of your heart. It provides your doctor with information about the size and shape of your heart and how well your heart's chambers and valves are working. This procedure takes approximately one hour. There are no restrictions for this procedure. Please do NOT wear cologne, perfume, aftershave, or lotions (deodorant is allowed). Please arrive 15 minutes prior to your appointment time.  Please note: We ask at that you not bring children with you during ultrasound (echo/ vascular) testing. Due to room size and safety concerns, children are not allowed in the ultrasound rooms during exams. Our front office staff cannot provide observation of children in our lobby area while testing is being conducted. An adult accompanying a patient to their appointment will only be allowed in the ultrasound room at the discretion of the ultrasound technician under special circumstances. We apologize for any inconvenience.   Follow-Up: Your physician recommends that you schedule a follow-up appointment in: 1 year  Any Other Special Instructions Will Be Listed Below (If Applicable). Thank you for choosing [facility]!     If you need a refill on your cardiac medications before your next appointment, please call your pharmacy.

## 2024-04-16 ENCOUNTER — Ambulatory Visit: Attending: Cardiology

## 2024-04-16 DIAGNOSIS — I351 Nonrheumatic aortic (valve) insufficiency: Secondary | ICD-10-CM | POA: Diagnosis present

## 2024-04-20 LAB — ECHOCARDIOGRAM COMPLETE
AR max vel: 1.94 cm2
AV Area VTI: 1.83 cm2
AV Area mean vel: 1.72 cm2
AV Mean grad: 7.7 mmHg
AV Peak grad: 14.7 mmHg
AV Vena cont: 0.5 cm
Ao pk vel: 1.91 m/s
Area-P 1/2: 3.31 cm2
Calc EF: 68.3 %
P 1/2 time: 576 ms
S' Lateral: 2.6 cm
Single Plane A2C EF: 70.7 %
Single Plane A4C EF: 66.3 %

## 2024-05-09 ENCOUNTER — Emergency Department (HOSPITAL_COMMUNITY)

## 2024-05-09 ENCOUNTER — Other Ambulatory Visit: Payer: Self-pay

## 2024-05-09 ENCOUNTER — Emergency Department (HOSPITAL_COMMUNITY)
Admission: EM | Admit: 2024-05-09 | Discharge: 2024-05-09 | Disposition: A | Discharge status: Home / Self Care | Attending: Emergency Medicine | Admitting: Emergency Medicine

## 2024-05-09 DIAGNOSIS — Z7982 Long term (current) use of aspirin: Secondary | ICD-10-CM | POA: Insufficient documentation

## 2024-05-09 DIAGNOSIS — Z79899 Other long term (current) drug therapy: Secondary | ICD-10-CM | POA: Insufficient documentation

## 2024-05-09 DIAGNOSIS — E039 Hypothyroidism, unspecified: Secondary | ICD-10-CM | POA: Insufficient documentation

## 2024-05-09 DIAGNOSIS — Z794 Long term (current) use of insulin: Secondary | ICD-10-CM | POA: Insufficient documentation

## 2024-05-09 DIAGNOSIS — R1013 Epigastric pain: Secondary | ICD-10-CM | POA: Insufficient documentation

## 2024-05-09 DIAGNOSIS — J45909 Unspecified asthma, uncomplicated: Secondary | ICD-10-CM | POA: Insufficient documentation

## 2024-05-09 DIAGNOSIS — I10 Essential (primary) hypertension: Secondary | ICD-10-CM | POA: Insufficient documentation

## 2024-05-09 DIAGNOSIS — R110 Nausea: Secondary | ICD-10-CM | POA: Insufficient documentation

## 2024-05-09 LAB — URINALYSIS, ROUTINE W REFLEX MICROSCOPIC
Bacteria, UA: NONE SEEN
Bilirubin Urine: NEGATIVE
Glucose, UA: >=500 mg/dL — AB
Ketones, ur: 5 mg/dL — AB
Nitrite: NEGATIVE
Protein, ur: NEGATIVE mg/dL
Specific Gravity, Urine: 1.026 (ref 1.005–1.030)
pH: 5 (ref 5.0–8.0)

## 2024-05-09 LAB — CBC
HCT: 52.1 % — ABNORMAL HIGH (ref 36.0–46.0)
Hemoglobin: 16.9 g/dL — ABNORMAL HIGH (ref 12.0–15.0)
MCH: 28.6 pg (ref 26.0–34.0)
MCHC: 32.4 g/dL (ref 30.0–36.0)
MCV: 88.3 fL (ref 80.0–100.0)
Platelets: 291 K/uL (ref 150–400)
RBC: 5.9 MIL/uL — ABNORMAL HIGH (ref 3.87–5.11)
RDW: 13.5 % (ref 11.5–15.5)
WBC: 13.1 K/uL — ABNORMAL HIGH (ref 4.0–10.5)
nRBC: 0 % (ref 0.0–0.2)

## 2024-05-09 LAB — COMPREHENSIVE METABOLIC PANEL WITH GFR
ALT: 53 U/L — ABNORMAL HIGH (ref 0–44)
AST: 27 U/L (ref 15–41)
Albumin: 4.8 g/dL (ref 3.5–5.0)
Alkaline Phosphatase: 124 U/L (ref 38–126)
Anion gap: 17 — ABNORMAL HIGH (ref 5–15)
BUN: 24 mg/dL — ABNORMAL HIGH (ref 8–23)
CO2: 22 mmol/L (ref 22–32)
Calcium: 10.3 mg/dL (ref 8.9–10.3)
Chloride: 98 mmol/L (ref 98–111)
Creatinine, Ser: 1.15 mg/dL — ABNORMAL HIGH (ref 0.44–1.00)
GFR, Estimated: 51 mL/min — ABNORMAL LOW
Glucose, Bld: 93 mg/dL (ref 70–99)
Potassium: 4.3 mmol/L (ref 3.5–5.1)
Sodium: 137 mmol/L (ref 135–145)
Total Bilirubin: 0.6 mg/dL (ref 0.0–1.2)
Total Protein: 7.9 g/dL (ref 6.5–8.1)

## 2024-05-09 LAB — LIPASE, BLOOD: Lipase: 119 U/L — ABNORMAL HIGH (ref 11–51)

## 2024-05-09 MED ORDER — SODIUM CHLORIDE 0.9 % IV BOLUS
1000.0000 mL | Freq: Once | INTRAVENOUS | Status: AC
  Administered 2024-05-09: 1000 mL via INTRAVENOUS

## 2024-05-09 MED ORDER — MORPHINE SULFATE (PF) 4 MG/ML IV SOLN
4.0000 mg | Freq: Once | INTRAVENOUS | Status: AC
  Administered 2024-05-09: 4 mg via INTRAVENOUS
  Filled 2024-05-09: qty 1

## 2024-05-09 MED ORDER — KETOROLAC TROMETHAMINE 15 MG/ML IJ SOLN
15.0000 mg | Freq: Once | INTRAMUSCULAR | Status: AC
  Administered 2024-05-09: 15 mg via INTRAVENOUS
  Filled 2024-05-09: qty 1

## 2024-05-09 MED ORDER — OXYCODONE HCL 5 MG PO TABS: 5.0000 mg | ORAL_TABLET | ORAL | 0 refills | Status: AC | PRN

## 2024-05-09 MED ORDER — IOHEXOL 300 MG/ML  SOLN
100.0000 mL | Freq: Once | INTRAMUSCULAR | Status: AC | PRN
  Administered 2024-05-09: 100 mL via INTRAVENOUS

## 2024-05-09 MED ORDER — ONDANSETRON HCL 4 MG/2ML IJ SOLN
4.0000 mg | Freq: Once | INTRAMUSCULAR | Status: AC
  Administered 2024-05-09: 4 mg via INTRAVENOUS
  Filled 2024-05-09: qty 2

## 2024-05-09 NOTE — ED Provider Notes (Signed)
 " [place] [facility]MENT AT [facility] Provider Note   CSN: [id number] Arrival date & time: 05/09/24  1721     Patient presents with: Abdominal Pain   Stephanie Ewing is a 68 y.o. female with history of pancreatitis presents with complaints of epigastric abdominal pain since Wednesday.  Associate with nausea without vomiting or diarrhea.  States that this feels very identical to prior episodes of acute pancreatitis.  Denies any chest pain or shortness of breath.  Symptoms are constant, nonexertional.  No urinary or vaginal symptoms.  Does have a history of cholecystectomy as well as EGD.  No other abdominal surgeries.    Abdominal Pain     Past Medical History:  Diagnosis Date   Afib (HCC)    Arthritis    psoratic   Asthma    weak    Constipation    Depression    Dysrhythmia    AFIb   GERD (gastroesophageal reflux disease)    History of hiatal hernia    Hyperaldosteronism    Hypertension    Hypokalemia    Hypothyroidism    Insomnia    Migraine    ocassional   Nicotine  dependence, cigarettes, uncomplicated    Pancreatitis    PONV (postoperative nausea and vomiting)    Pre-diabetes    Thyroid disease    hypothyroid   Past Surgical History:  Procedure Laterality Date   ANTERIOR CERVICAL DECOMPRESSION/DISCECTOMY FUSION 4 LEVELS N/A 07/20/2020   Procedure: ANTERIOR CERVICAL DECOMPRESSION FUSION CERVICAL FOUR-CERVICAL FIVE, CERVICAL FIVE-CERVICAL SIX, CERVICAL SIX-CERVICAL SEVEN;  Surgeon: Lanis Pupa, MD;  Location: [facility];  Service: Neurosurgery;  Laterality: N/A;   BIOPSY  06/28/2020   Procedure: BIOPSY;  Surgeon: Wilhelmenia Aloha Raddle., MD;  Location: [facility];  Service: Gastroenterology;;   CHOLECYSTECTOMY N/A 12/30/2020   Procedure: LAPAROSCOPIC CHOLECYSTECTOMY;  Surgeon: Teresa Lonni CHRISTELLA, MD;  Location: [facility];  Service: General;  Laterality: N/A;   elbow surgery Right    ESOPHAGOGASTRODUODENOSCOPY (EGD) WITH PROPOFOL  N/A  06/28/2020   Procedure: ESOPHAGOGASTRODUODENOSCOPY (EGD) WITH PROPOFOL ;  Surgeon: Wilhelmenia Aloha Raddle., MD;  Location: [facility];  Service: Gastroenterology;  Laterality: N/A;   ESOPHAGOGASTRODUODENOSCOPY (EGD) WITH PROPOFOL  N/A 08/05/2020   Procedure: ESOPHAGOGASTRODUODENOSCOPY (EGD) WITH PROPOFOL ;  Surgeon: Wilhelmenia Aloha Raddle., MD;  Location: [facility];  Service: Gastroenterology;  Laterality: N/A;   EUS N/A 06/28/2020   Procedure: UPPER ENDOSCOPIC ULTRASOUND (EUS) LINEAR;  Surgeon: Wilhelmenia Aloha Raddle., MD;  Location: [facility];  Service: Gastroenterology;  Laterality: N/A;   EUS N/A 08/05/2020   Procedure: UPPER ENDOSCOPIC ULTRASOUND (EUS) RADIAL;  Surgeon: Wilhelmenia Aloha Raddle., MD;  Location: [facility] END[facility]COPY;  Service: Gastroenterology;  Laterality: N/A;   FINE NEEDLE ASPIRATION  06/28/2020   Procedure: FINE NEEDLE ASPIRATION (FNA) LINEAR;  Surgeon: Wilhelmenia Aloha Raddle., MD;  Location: [facility];  Service: Gastroenterology;;   FINE NEEDLE ASPIRATION  08/05/2020   Procedure: FINE NEEDLE ASPIRATION;  Surgeon: Wilhelmenia Aloha Raddle., MD;  Location: [facility] END[facility]COPY;  Service: Gastroenterology;;   POLYPECTOMY  06/28/2020   Procedure: POLYPECTOMY;  Surgeon: Wilhelmenia Aloha Raddle., MD;  Location: [facility];  Service: Gastroenterology;;   SHOULDER SURGERY Bilateral    inpingement release   TOTAL ABDOMINAL HYSTERECTOMY  2015   ULNAR NERVE REPAIR Right      Prior to Admission medications  Medication Sig Start Date End Date Taking? Authorizing Provider  oxyCODONE  (ROXICODONE ) 5 MG immediate release tablet Take 1 tablet (5 mg total) by mouth every 4 (four) hours as needed. 05/09/24  Yes Donnajean Lynwood DEL, PA-C  acetaminophen  (TYLENOL ) 325 MG tablet Take 2 tablets (650 mg total) by mouth every 6 (six) hours. Patient taking differently: Take 650 mg by mouth every 6 (six) hours as needed. 04/15/20   Macario Dorothyann HERO, MD  albuterol  (VENTOLIN  HFA) [phone] Base) MCG/ACT  inhaler Inhale into the lungs every 6 (six) hours as needed for wheezing or shortness of breath.    [provider]  ALPRAZolam (XANAX) 0.5 MG tablet Take 0.5 mg by mouth 3 (three) times daily as needed. 03/22/23   [provider]  aspirin  EC 81 MG tablet Take 1 tablet (81 mg total) by mouth at bedtime. 07/27/20   Costella, Vincent J, PA-C  baclofen  (LIORESAL ) 10 MG tablet Take 1 tablet (10 mg total) by mouth at bedtime. 07/10/23   Mollie Nestor HERO, PA-C  Cholecalciferol (VITAMIN D) 50 MCG (2000 UT) CAPS Take 2,000 Units by mouth in the morning.    [provider]  citalopram  (CELEXA ) 40 MG tablet Take 40 mg by mouth daily. 07/25/23   [provider]  Cyanocobalamin (B-12) 5000 MCG SUBL Take 1 tablet by mouth daily.    [provider]  diltiazem  (CARDIZEM ) 30 MG tablet Take 1 tablet every 4 hours AS NEEDED for heart rate >100 as long as top blood pressure >100. 09/19/19   Dow Arland BROCKS, NP  famotidine  (PEPCID ) 40 MG tablet TAKE 1 TABLET(40 MG) BY MOUTH AT BEDTIME 09/25/23   McMichael, Bayley M, PA-C  fexofenadine (ALLEGRA) 180 MG tablet Take 180 mg by mouth in the morning.    [provider]  gabapentin  (NEURONTIN ) 600 MG tablet Take 600 mg by mouth at bedtime. 04/13/22   [provider]  HYDROMET 5-1.5 MG/5ML syrup Take 5 mLs by mouth 3 (three) times daily as needed. 03/31/22   [provider]  JARDIANCE 10 MG TABS tablet Take 10 mg by mouth daily.    [provider]  levothyroxine  (SYNTHROID ) 175 MCG tablet Take 175 mcg by mouth daily before breakfast.    [provider]  meloxicam (MOBIC) 15 MG tablet Take 15 mg by mouth daily as needed. 04/17/22   [provider]  montelukast  (SINGULAIR ) 10 MG tablet Take 10 mg by mouth at bedtime.    [provider]  MOUNJARO 10 MG/0.5ML Pen Inject 10 mg into the skin once a week. 03/11/24   [provider]  pantoprazole  (PROTONIX ) 40 MG tablet  TAKE 1 TABLET BY MOUTH TWICE DAILY BEFORE A MEAL 01/08/24   McMichael, Bayley M, PA-C  pramipexole (MIRAPEX) 0.5 MG tablet Take 0.5 mg by mouth at bedtime. 02/14/22   [provider]  QUEtiapine  (SEROQUEL ) 50 MG tablet Take 50 mg by mouth at bedtime. 07/26/23   [provider]  RESTASIS  0.05 % ophthalmic emulsion Place 1 drop into both eyes 2 (two) times daily. 07/29/19   [provider]  rosuvastatin (CRESTOR) 5 MG tablet Take 5 mg by mouth daily.    [provider]  sodium chloride  (OCEAN) 0.65 % SOLN nasal spray Place 1 spray into both nostrils as needed for congestion.    [provider]  spironolactone  (ALDACTONE ) 50 MG tablet Take 50 mg by mouth daily. 09/09/21   [provider]  Tenapanor HCl (IBSRELA ) 50 MG TABS Take 50 mg by mouth 2 (two) times daily before a meal. 01/23/22   Armbruster, Elspeth SQUIBB, MD  traMADol  (ULTRAM ) 50 MG tablet Take 50 mg by mouth every 12 (twelve)  hours as needed.    [provider]    Allergies: Semaglutide    Review of Systems  Gastrointestinal:  Positive for abdominal pain.    Updated Vital Signs BP (!) 147/72 (BP Location: Right Arm)   Pulse 79   Temp 98.6 F (37 C) (Oral)   Resp 16   Ht 5' 7 (1.702 m)   Wt 72.6 kg   LMP  (LMP Unknown)   SpO2 98%   BMI 25.06 kg/m   Physical Exam Vitals and nursing note reviewed.  Constitutional:      General: She is not in acute distress.    Appearance: She is well-developed.  HENT:     Head: Normocephalic and atraumatic.  Eyes:     Conjunctiva/sclera: Conjunctivae normal.  Cardiovascular:     Rate and Rhythm: Normal rate and regular rhythm.     Heart sounds: No murmur heard. Pulmonary:     Effort: Pulmonary effort is normal. No respiratory distress.     Breath sounds: Normal breath sounds.  Abdominal:     Palpations: Abdomen is soft.     Tenderness: There is abdominal tenderness.     Comments: Epigastric abdominal tenderness, soft nondistended   Musculoskeletal:        General: No swelling.     Cervical back: Neck supple.  Skin:    General: Skin is warm and dry.     Capillary Refill: Capillary refill takes less than 2 seconds.  Neurological:     Mental Status: She is alert.  Psychiatric:        Mood and Affect: Mood normal.     (all labs ordered are listed, but only abnormal results are displayed) Labs Reviewed  LIPASE, BLOOD - Abnormal; Notable for the following components:      Result Value   Lipase 119 (*)    All other components within normal limits  COMPREHENSIVE METABOLIC PANEL WITH GFR - Abnormal; Notable for the following components:   BUN 24 (*)    Creatinine, Ser 1.15 (*)    ALT 53 (*)    GFR, Estimated 51 (*)    Anion gap 17 (*)    All other components within normal limits  CBC - Abnormal; Notable for the following components:   WBC 13.1 (*)    RBC 5.90 (*)    Hemoglobin 16.9 (*)    HCT 52.1 (*)    All other components within normal limits  URINALYSIS, ROUTINE W REFLEX MICROSCOPIC - Abnormal; Notable for the following components:   APPearance HAZY (*)    Glucose, UA >=500 (*)    Hgb urine dipstick SMALL (*)    Ketones, ur 5 (*)    Leukocytes,Ua MODERATE (*)    All other components within normal limits    EKG: None  Radiology: CT ABDOMEN PELVIS W CONTRAST Result Date: 05/09/2024 EXAM: CT ABDOMEN AND PELVIS WITH CONTRAST 05/09/2024 07:35:34 PM TECHNIQUE: CT of the abdomen and pelvis was performed with the administration of 100 mL of iohexol  (OMNIPAQUE ) 300 MG/ML solution. Multiplanar reformatted images are provided for review. Automated exposure control, iterative reconstruction, and/or weight-based adjustment of the mA/kV was utilized to reduce the radiation dose to as low as reasonably achievable. COMPARISON: 01/04/2023 CLINICAL HISTORY: Abdominal pain, acute, nonlocalized; Pancreatitis, acute, severe. FINDINGS: LOWER CHEST: No acute abnormality. LIVER: The liver is unremarkable. GALLBLADDER AND  BILE DUCTS: Status post cholecystectomy. Mild dilation of the biliary tree likely reflects post cholecystectomy change. SPLEEN: No acute abnormality. PANCREAS: There is asymmetric  fatty infiltration of the head and body of the pancreas, similar to prior examination, possibly the sequela of prior inflammation. There are, however, no superimposed acute pancreatic inflammatory changes identified. The pancreatic duct is not dilated. No loculated pancreatic fluid collections. ADRENAL GLANDS: No acute abnormality. KIDNEYS, URETERS AND BLADDER: No stones in the kidneys or ureters. No hydronephrosis. No perinephric or periureteral stranding. Urinary bladder is unremarkable. GI AND BOWEL: Stomach demonstrates no acute abnormality. There is no bowel obstruction. PERITONEUM AND RETROPERITONEUM: No ascites. No free air. VASCULATURE: Mild aortoiliac atherosclerotic calcification. No aortic aneurysm. LYMPH NODES: No lymphadenopathy. REPRODUCTIVE ORGANS: The uterus is absent. No adnexal masses are seen. BONES AND SOFT TISSUES: Osseous structures are age appropriate. No acute bone abnormality. No lytic or blastic bone lesion. No focal soft tissue abnormality. IMPRESSION: 1. No acute abdominopelvic abnormality, with no acute pancreatitis. 2. Asymmetric fatty infiltration of the pancreatic head and body, similar to prior, possibly sequela of prior inflammation. 3. Status post cholecystectomy with mild biliary tree dilation, likely postcholecystectomy change. 4. Status post hysterectomy. 5. Raf score: Aortic atherosclerosis (icd10-i70.0). Electronically signed by: Dorethia Molt MD 05/09/2024 09:35 PM EST RP Workstation: [id number]     Procedures   Medications Ordered in the ED  sodium chloride  0.9 % bolus 1,000 mL (0 mLs Intravenous Stopped 05/09/24 2121)  ketorolac  (TORADOL ) 15 MG/ML injection 15 mg (15 mg Intravenous Given 05/09/24 1838)  ondansetron  (ZOFRAN ) injection 4 mg (4 mg Intravenous Given 05/09/24 1838)  morphine   (PF) 4 MG/ML injection 4 mg (4 mg Intravenous Given 05/09/24 1838)  sodium chloride  0.9 % bolus 1,000 mL (0 mLs Intravenous Stopped 05/09/24 2121)  iohexol  (OMNIPAQUE ) 300 MG/ML solution 100 mL (100 mLs Intravenous Contrast Given 05/09/24 1927)    Clinical Course as of 05/09/24 2235  Fri May 09, 2024  1814 Patient with history of pancreatitis evaluated for complaints of abdominal pain with associated nausea since Wednesday.  On exam she has tenderness to the epigastric region.  She is hypertensive upon arrival, otherwise hemodynamically stable.  Will obtain routine lab work and CT imaging.  IV fluids and GI cocktail.. [JT]  1848 CBC(!) Leukocytosis of 13.1 [JT]  1848 Lipase, blood(!) Mildly elevated to 119 [JT]  1848 Comprehensive metabolic panel(!) Anion gap of 17, bicarb within normal limits, glucose within normal limit, creatinine at baseline [JT]    Clinical Course User Index [JT] Donnajean Lynwood DEL, PA-C                                 Medical Decision Making Amount and/or Complexity of Data Reviewed Labs: ordered. Decision-making details documented in ED Course. Radiology: ordered.  Risk Prescription drug management.   This patient presents to the ED with chief complaint(s) of Abdominal pain.  The complaint involves an extensive differential diagnosis and also carries with it a high risk of complications and morbidity.   Pertinent past medical history as listed in HPI  The differential diagnosis includes  Pancreatitis, ACS, PUD Additional history obtained: Records reviewed Care Everywhere/External Records  Disposition:   Patient will be discharged home. The patient has been appropriately medically screened and/or stabilized in the ED. I have low suspicion for any other emergent medical condition which would require further screening, evaluation or treatment in the ED or require inpatient management. At time of discharge the patient is hemodynamically stable and in no acute  distress. I have discussed work-up results and diagnosis with patient and answered  all questions. Patient is agreeable with discharge plan. We discussed strict return precautions for returning to the emergency department and they verbalized understanding.     Social Determinants of Health:   none  This note was dictated with voice recognition software.  Despite best efforts at proofreading, errors may have occurred which can change the documentation meaning.       Final diagnoses:  Epigastric pain    ED Discharge Orders          Ordered    oxyCODONE  (ROXICODONE ) 5 MG immediate release tablet  Every 4 hours PRN        05/09/24 2230               Stephanie Arakelian H, PA-C 05/09/24 2235    Patsey Lot, MD 05/10/24 0009  "

## 2024-05-09 NOTE — ED Triage Notes (Signed)
 Pt reports upper abdominal pain starting on Wednesday. Pt states radiates to back. Pt has nausea denies diarrhea or vomiting. Pt reports hx of pancreatitis and feels the same.

## 2024-05-09 NOTE — Discharge Instructions (Addendum)
 You were evaluated in the emergency room for abdominal pain.  Your lab work was consistent with early signs of pancreatitis.  Your CT scan was negative however.  A short supply of oxycodone  was sent into your pharmacy.  Please continue your nausea medications at home.  Please follow the pancreatitis eating plan listed.  If you experience any new or worsening symptoms please return to the emergency room.

## 2024-05-10 ENCOUNTER — Encounter (HOSPITAL_COMMUNITY): Payer: Self-pay | Admitting: Emergency Medicine

## 2024-05-10 ENCOUNTER — Other Ambulatory Visit: Payer: Self-pay

## 2024-05-10 ENCOUNTER — Emergency Department (HOSPITAL_COMMUNITY)
Admission: EM | Admit: 2024-05-10 | Discharge: 2024-05-10 | Discharge status: Against Medical Advice | Source: Home / Self Care

## 2024-05-10 DIAGNOSIS — Z5321 Procedure and treatment not carried out due to patient leaving prior to being seen by health care provider: Secondary | ICD-10-CM | POA: Insufficient documentation

## 2024-05-10 DIAGNOSIS — K859 Acute pancreatitis without necrosis or infection, unspecified: Secondary | ICD-10-CM | POA: Insufficient documentation

## 2024-05-10 NOTE — ED Triage Notes (Addendum)
 Pov c/o of pancreatitis that started dec 24. Pt was seen yesterday. Was not admitted to the hospital but pt states that pain has gotten worse. Pt endorses nausea/vomiting.

## 2024-05-11 ENCOUNTER — Encounter (HOSPITAL_COMMUNITY): Payer: Self-pay

## 2024-05-11 ENCOUNTER — Emergency Department (HOSPITAL_COMMUNITY)

## 2024-05-11 ENCOUNTER — Inpatient Hospital Stay (HOSPITAL_COMMUNITY)
Admission: EM | Admit: 2024-05-11 | Discharge: 2024-05-13 | DRG: 440 | Disposition: A | Discharge status: Home / Self Care | Attending: Hospitalist | Admitting: Hospitalist

## 2024-05-11 DIAGNOSIS — Z8379 Family history of other diseases of the digestive system: Secondary | ICD-10-CM

## 2024-05-11 DIAGNOSIS — Z888 Allergy status to other drugs, medicaments and biological substances status: Secondary | ICD-10-CM

## 2024-05-11 DIAGNOSIS — Z7984 Long term (current) use of oral hypoglycemic drugs: Secondary | ICD-10-CM

## 2024-05-11 DIAGNOSIS — Z8249 Family history of ischemic heart disease and other diseases of the circulatory system: Secondary | ICD-10-CM

## 2024-05-11 DIAGNOSIS — Z7985 Long-term (current) use of injectable non-insulin antidiabetic drugs: Secondary | ICD-10-CM

## 2024-05-11 DIAGNOSIS — Z833 Family history of diabetes mellitus: Secondary | ICD-10-CM

## 2024-05-11 DIAGNOSIS — I480 Paroxysmal atrial fibrillation: Secondary | ICD-10-CM | POA: Diagnosis not present

## 2024-05-11 DIAGNOSIS — J45909 Unspecified asthma, uncomplicated: Secondary | ICD-10-CM | POA: Diagnosis present

## 2024-05-11 DIAGNOSIS — F32A Depression, unspecified: Secondary | ICD-10-CM | POA: Diagnosis present

## 2024-05-11 DIAGNOSIS — F17210 Nicotine dependence, cigarettes, uncomplicated: Secondary | ICD-10-CM | POA: Diagnosis present

## 2024-05-11 DIAGNOSIS — Z9049 Acquired absence of other specified parts of digestive tract: Secondary | ICD-10-CM

## 2024-05-11 DIAGNOSIS — Z8349 Family history of other endocrine, nutritional and metabolic diseases: Secondary | ICD-10-CM

## 2024-05-11 DIAGNOSIS — K219 Gastro-esophageal reflux disease without esophagitis: Secondary | ICD-10-CM | POA: Diagnosis present

## 2024-05-11 DIAGNOSIS — Z7982 Long term (current) use of aspirin: Secondary | ICD-10-CM

## 2024-05-11 DIAGNOSIS — K859 Acute pancreatitis without necrosis or infection, unspecified: Principal | ICD-10-CM | POA: Diagnosis present

## 2024-05-11 DIAGNOSIS — Z79890 Hormone replacement therapy (postmenopausal): Secondary | ICD-10-CM

## 2024-05-11 DIAGNOSIS — R1013 Epigastric pain: Principal | ICD-10-CM

## 2024-05-11 DIAGNOSIS — K2970 Gastritis, unspecified, without bleeding: Secondary | ICD-10-CM | POA: Diagnosis present

## 2024-05-11 DIAGNOSIS — R7989 Other specified abnormal findings of blood chemistry: Secondary | ICD-10-CM

## 2024-05-11 DIAGNOSIS — R109 Unspecified abdominal pain: Secondary | ICD-10-CM | POA: Diagnosis not present

## 2024-05-11 DIAGNOSIS — Z808 Family history of malignant neoplasm of other organs or systems: Secondary | ICD-10-CM

## 2024-05-11 DIAGNOSIS — E039 Hypothyroidism, unspecified: Secondary | ICD-10-CM | POA: Diagnosis present

## 2024-05-11 DIAGNOSIS — E119 Type 2 diabetes mellitus without complications: Secondary | ICD-10-CM | POA: Diagnosis present

## 2024-05-11 DIAGNOSIS — I10 Essential (primary) hypertension: Secondary | ICD-10-CM | POA: Diagnosis present

## 2024-05-11 DIAGNOSIS — L4059 Other psoriatic arthropathy: Secondary | ICD-10-CM | POA: Diagnosis present

## 2024-05-11 DIAGNOSIS — Z860101 Personal history of adenomatous and serrated colon polyps: Secondary | ICD-10-CM

## 2024-05-11 DIAGNOSIS — K760 Fatty (change of) liver, not elsewhere classified: Secondary | ICD-10-CM | POA: Diagnosis present

## 2024-05-11 DIAGNOSIS — E269 Hyperaldosteronism, unspecified: Secondary | ICD-10-CM | POA: Diagnosis present

## 2024-05-11 DIAGNOSIS — Z981 Arthrodesis status: Secondary | ICD-10-CM

## 2024-05-11 DIAGNOSIS — K838 Other specified diseases of biliary tract: Secondary | ICD-10-CM | POA: Diagnosis present

## 2024-05-11 DIAGNOSIS — Z825 Family history of asthma and other chronic lower respiratory diseases: Secondary | ICD-10-CM

## 2024-05-11 DIAGNOSIS — K863 Pseudocyst of pancreas: Secondary | ICD-10-CM | POA: Diagnosis present

## 2024-05-11 DIAGNOSIS — Z79899 Other long term (current) drug therapy: Secondary | ICD-10-CM

## 2024-05-11 LAB — LIPASE, BLOOD: Lipase: 32 U/L (ref 11–51)

## 2024-05-11 LAB — URINALYSIS, ROUTINE W REFLEX MICROSCOPIC
Bilirubin Urine: NEGATIVE
Glucose, UA: >=500 mg/dL — AB
Ketones, ur: 20 mg/dL — AB
Leukocytes,Ua: NEGATIVE
Nitrite: NEGATIVE
Protein, ur: NEGATIVE mg/dL
Specific Gravity, Urine: 1.026 (ref 1.005–1.030)
pH: 5 (ref 5.0–8.0)

## 2024-05-11 LAB — COMPREHENSIVE METABOLIC PANEL WITH GFR
ALT: 246 U/L — ABNORMAL HIGH (ref 0–44)
AST: 72 U/L — ABNORMAL HIGH (ref 15–41)
Albumin: 4.6 g/dL (ref 3.5–5.0)
Alkaline Phosphatase: 221 U/L — ABNORMAL HIGH (ref 38–126)
Anion gap: 21 — ABNORMAL HIGH (ref 5–15)
BUN: 22 mg/dL (ref 8–23)
CO2: 19 mmol/L — ABNORMAL LOW (ref 22–32)
Calcium: 10.5 mg/dL — ABNORMAL HIGH (ref 8.9–10.3)
Chloride: 97 mmol/L — ABNORMAL LOW (ref 98–111)
Creatinine, Ser: 1.14 mg/dL — ABNORMAL HIGH (ref 0.44–1.00)
GFR, Estimated: 52 mL/min — ABNORMAL LOW
Glucose, Bld: 86 mg/dL (ref 70–99)
Potassium: 4.7 mmol/L (ref 3.5–5.1)
Sodium: 137 mmol/L (ref 135–145)
Total Bilirubin: 1.2 mg/dL (ref 0.0–1.2)
Total Protein: 8.1 g/dL (ref 6.5–8.1)

## 2024-05-11 LAB — CBC
HCT: 52.7 % — ABNORMAL HIGH (ref 36.0–46.0)
Hemoglobin: 17 g/dL — ABNORMAL HIGH (ref 12.0–15.0)
MCH: 28.8 pg (ref 26.0–34.0)
MCHC: 32.3 g/dL (ref 30.0–36.0)
MCV: 89.2 fL (ref 80.0–100.0)
Platelets: 253 K/uL (ref 150–400)
RBC: 5.91 MIL/uL — ABNORMAL HIGH (ref 3.87–5.11)
RDW: 13.4 % (ref 11.5–15.5)
WBC: 15.3 K/uL — ABNORMAL HIGH (ref 4.0–10.5)
nRBC: 0 % (ref 0.0–0.2)

## 2024-05-11 LAB — CBG MONITORING, ED: Glucose-Capillary: 69 mg/dL — ABNORMAL LOW (ref 70–99)

## 2024-05-11 MED ORDER — PANTOPRAZOLE SODIUM 40 MG IV SOLR
40.0000 mg | INTRAVENOUS | Status: DC
  Administered 2024-05-11 – 2024-05-12 (×2): 40 mg via INTRAVENOUS
  Filled 2024-05-11: qty 10

## 2024-05-11 MED ORDER — HYDROMORPHONE HCL 1 MG/ML IJ SOLN
0.5000 mg | Freq: Once | INTRAMUSCULAR | Status: AC
  Administered 2024-05-11: 0.5 mg via INTRAVENOUS
  Filled 2024-05-11: qty 0.5

## 2024-05-11 MED ORDER — HYDROMORPHONE HCL 1 MG/ML IJ SOLN
0.5000 mg | INTRAMUSCULAR | Status: DC | PRN
  Administered 2024-05-11 – 2024-05-12 (×5): 0.5 mg via INTRAVENOUS
  Filled 2024-05-11: qty 0.5

## 2024-05-11 MED ORDER — PROCHLORPERAZINE EDISYLATE 10 MG/2ML IJ SOLN
5.0000 mg | Freq: Once | INTRAMUSCULAR | Status: AC
  Administered 2024-05-11: 5 mg via INTRAVENOUS
  Filled 2024-05-11: qty 2

## 2024-05-11 MED ORDER — SODIUM CHLORIDE 0.9 % IV BOLUS
500.0000 mL | Freq: Once | INTRAVENOUS | Status: AC
  Administered 2024-05-11: 500 mL via INTRAVENOUS

## 2024-05-11 MED ORDER — OXYCODONE-ACETAMINOPHEN 5-325 MG PO TABS
1.0000 | ORAL_TABLET | ORAL | Status: AC | PRN
  Administered 2024-05-11 – 2024-05-12 (×2): 1 via ORAL
  Filled 2024-05-11: qty 1

## 2024-05-11 MED ORDER — GADOBUTROL 1 MMOL/ML IV SOLN
7.5000 mL | Freq: Once | INTRAVENOUS | Status: AC | PRN
  Administered 2024-05-11: 7.5 mL via INTRAVENOUS

## 2024-05-11 MED ORDER — ONDANSETRON HCL 4 MG/2ML IJ SOLN
4.0000 mg | Freq: Once | INTRAMUSCULAR | Status: AC
  Administered 2024-05-11: 4 mg via INTRAVENOUS
  Filled 2024-05-11: qty 2

## 2024-05-11 MED ORDER — ONDANSETRON 4 MG PO TBDP
4.0000 mg | ORAL_TABLET | Freq: Once | ORAL | Status: AC | PRN
  Administered 2024-05-11: 4 mg via ORAL
  Filled 2024-05-11: qty 1

## 2024-05-11 MED ORDER — DEXTROSE-SODIUM CHLORIDE 5-0.9 % IV SOLN: INTRAVENOUS | Status: DC

## 2024-05-11 NOTE — H&P (Signed)
 " History and Physical    Stephanie Ewing FMW:[id number] DOB: 03-05-1956 DOA: 05/11/2024  PCP: Leavy Waddell NOVAK, FNP  Patient coming from: Home  I have personally briefly reviewed patient's old medical records in [facility]  Chief Complaint: Abdominal pain  HPI: Stephanie Ewing is a 68 y.o. female with medical history significant for hypertension, pancreatitis, atrial fibrillation, hypothyroidism. Patient presented to the ED with complaints of nausea, vomiting and upper abdominal pain that started 4 days ago christmas eve.  Abdominal pain radiates around her upper abdomen to the back.  She reports vomiting with any oral intake including water.  She denies any new medications.  She was started on once weekly Mounjaro 07/2023. Patient was in the ED 12/26, lipase was mildly elevated at 119, CT abdomen and pelvis with contrast was without acute abnormality.  Vomiting resolved while in ED, but on getting home she started vomiting again, with persistent abdominal pain.  ED Course: Stable vitals.  WBC 16.3.   AST 72, ALT 246, ALP 221.   Anion gap 21, serum bicarb 19. MRCP-preliminary results per EDP was negative for acute abnormality. Hospitalist to admit for intractable abdominal pain and vomiting.  Review of Systems: As per HPI all other systems reviewed and negative.  Past Medical History:  Diagnosis Date   Afib (HCC)    Arthritis    psoratic   Asthma    weak    Constipation    Depression    Dysrhythmia    AFIb   GERD (gastroesophageal reflux disease)    History of hiatal hernia    Hyperaldosteronism    Hypertension    Hypokalemia    Hypothyroidism    Insomnia    Migraine    ocassional   Nicotine  dependence, cigarettes, uncomplicated    Pancreatitis    PONV (postoperative nausea and vomiting)    Pre-diabetes    Thyroid disease    hypothyroid    Past Surgical History:  Procedure Laterality Date   ANTERIOR CERVICAL DECOMPRESSION/DISCECTOMY FUSION 4 LEVELS N/A  07/20/2020   Procedure: ANTERIOR CERVICAL DECOMPRESSION FUSION CERVICAL FOUR-CERVICAL FIVE, CERVICAL FIVE-CERVICAL SIX, CERVICAL SIX-CERVICAL SEVEN;  Surgeon: Lanis Pupa, MD;  Location: [facility];  Service: Neurosurgery;  Laterality: N/A;   BIOPSY  06/28/2020   Procedure: BIOPSY;  Surgeon: Wilhelmenia Aloha Raddle., MD;  Location: [facility];  Service: Gastroenterology;;   CHOLECYSTECTOMY N/A 12/30/2020   Procedure: LAPAROSCOPIC CHOLECYSTECTOMY;  Surgeon: Teresa Lonni CHRISTELLA, MD;  Location: [facility];  Service: General;  Laterality: N/A;   elbow surgery Right    ESOPHAGOGASTRODUODENOSCOPY (EGD) WITH PROPOFOL  N/A 06/28/2020   Procedure: ESOPHAGOGASTRODUODENOSCOPY (EGD) WITH PROPOFOL ;  Surgeon: Wilhelmenia Aloha Raddle., MD;  Location: [facility];  Service: Gastroenterology;  Laterality: N/A;   ESOPHAGOGASTRODUODENOSCOPY (EGD) WITH PROPOFOL  N/A 08/05/2020   Procedure: ESOPHAGOGASTRODUODENOSCOPY (EGD) WITH PROPOFOL ;  Surgeon: Wilhelmenia Aloha Raddle., MD;  Location: [facility]COPY;  Service: Gastroenterology;  Laterality: N/A;   EUS N/A 06/28/2020   Procedure: UPPER ENDOSCOPIC ULTRASOUND (EUS) LINEAR;  Surgeon: Wilhelmenia Aloha Raddle., MD;  Location: [facility];  Service: Gastroenterology;  Laterality: N/A;   EUS N/A 08/05/2020   Procedure: UPPER ENDOSCOPIC ULTRASOUND (EUS) RADIAL;  Surgeon: Wilhelmenia Aloha Raddle., MD;  Location: [facility] ENDOSCOPY;  Service: Gastroenterology;  Laterality: N/A;   FINE NEEDLE ASPIRATION  06/28/2020   Procedure: FINE NEEDLE ASPIRATION (FNA) LINEAR;  Surgeon: Wilhelmenia Aloha Raddle., MD;  Location: [facility];  Service: Gastroenterology;;   FINE NEEDLE ASPIRATION  08/05/2020   Procedure: FINE NEEDLE ASPIRATION;  Surgeon:  Mansouraty, Aloha Raddle., MD;  Location: [facility] ENDOSCOPY;  Service: Gastroenterology;;   POLYPECTOMY  06/28/2020   Procedure: POLYPECTOMY;  Surgeon: Wilhelmenia Aloha Raddle., MD;  Location: [facility];  Service: Gastroenterology;;   SHOULDER SURGERY  Bilateral    inpingement release   TOTAL ABDOMINAL HYSTERECTOMY  2015   ULNAR NERVE REPAIR Right      reports that she has been smoking cigarettes. She started smoking about 53 years ago. She has a 53.2 pack-year smoking history. She has never used smokeless tobacco. She reports that she does not currently use alcohol. She reports that she does not use drugs.  Allergies[1]  Family History  Problem Relation Age of Onset   Hypertension Mother    Thyroid disease Mother        hypothyroidism   Asthma Father    Heart disease Father        from scarlet fever; died age 86 MVA   Hypertension Sister    Hypothyroidism Sister    Other Maternal Aunt        fatty liver   Hypertension Maternal Grandmother    Heart failure Maternal Grandmother    Diabetes Paternal Grandmother    Heart failure Paternal Grandmother    Thyroid cancer Cousin    Colon cancer Neg Hx    Esophageal cancer Neg Hx    Pancreatic cancer Neg Hx    Stomach cancer Neg Hx    Breast cancer Neg Hx    Rectal cancer Neg Hx     Prior to Admission medications  Medication Sig Start Date End Date Taking? Authorizing Provider  acetaminophen  (TYLENOL ) 325 MG tablet Take 2 tablets (650 mg total) by mouth every 6 (six) hours. Patient taking differently: Take 650 mg by mouth every 6 (six) hours as needed. 04/15/20   Macario Dorothyann HERO, MD  albuterol  (VENTOLIN  HFA) [phone] Base) MCG/ACT inhaler Inhale into the lungs every 6 (six) hours as needed for wheezing or shortness of breath.    [provider]  ALPRAZolam (XANAX) 0.5 MG tablet Take 0.5 mg by mouth 3 (three) times daily as needed. 03/22/23   [provider]  aspirin  EC 81 MG tablet Take 1 tablet (81 mg total) by mouth at bedtime. 07/27/20   Costella, Vincent J, PA-C  baclofen  (LIORESAL ) 10 MG tablet Take 1 tablet (10 mg total) by mouth at bedtime. 07/10/23   Mollie Nestor HERO, PA-C  Cholecalciferol (VITAMIN D) 50 MCG (2000 UT) CAPS Take 2,000 Units by mouth in the  morning.    [provider]  citalopram  (CELEXA ) 40 MG tablet Take 40 mg by mouth daily. 07/25/23   [provider]  Cyanocobalamin (B-12) 5000 MCG SUBL Take 1 tablet by mouth daily.    [provider]  diltiazem  (CARDIZEM ) 30 MG tablet Take 1 tablet every 4 hours AS NEEDED for heart rate >100 as long as top blood pressure >100. 09/19/19   Dow Arland BROCKS, NP  famotidine  (PEPCID ) 40 MG tablet TAKE 1 TABLET(40 MG) BY MOUTH AT BEDTIME 09/25/23   McMichael, Bayley M, PA-C  fexofenadine (ALLEGRA) 180 MG tablet Take 180 mg by mouth in the morning.    [provider]  gabapentin  (NEURONTIN ) 600 MG tablet Take 600 mg by mouth at bedtime. 04/13/22   [provider]  HYDROMET 5-1.5 MG/5ML syrup Take 5 mLs by mouth 3 (three) times daily as needed. 03/31/22   [provider]  JARDIANCE 10 MG TABS tablet Take 10 mg by mouth daily.  [provider]  levothyroxine  (SYNTHROID ) 175 MCG tablet Take 175 mcg by mouth daily before breakfast.    [provider]  meloxicam (MOBIC) 15 MG tablet Take 15 mg by mouth daily as needed. 04/17/22   [provider]  montelukast  (SINGULAIR ) 10 MG tablet Take 10 mg by mouth at bedtime.    [provider]  MOUNJARO 10 MG/0.5ML Pen Inject 10 mg into the skin once a week. 03/11/24   [provider]  oxyCODONE  (ROXICODONE ) 5 MG immediate release tablet Take 1 tablet (5 mg total) by mouth every 4 (four) hours as needed. 05/09/24   Donnajean Lynwood DEL, PA-C  pantoprazole  (PROTONIX ) 40 MG tablet TAKE 1 TABLET BY MOUTH TWICE DAILY BEFORE A MEAL 01/08/24   McMichael, Bayley M, PA-C  pramipexole (MIRAPEX) 0.5 MG tablet Take 0.5 mg by mouth at bedtime. 02/14/22   [provider]  QUEtiapine  (SEROQUEL ) 50 MG tablet Take 50 mg by mouth at bedtime. 07/26/23   [provider]  RESTASIS  0.05 % ophthalmic emulsion Place 1 drop into both eyes 2 (two) times daily. 07/29/19   [provider]  rosuvastatin (CRESTOR) 5 MG tablet Take 5 mg by mouth daily.    [provider]  sodium chloride  (OCEAN) 0.65 % SOLN nasal spray Place 1 spray into both nostrils as needed for congestion.    [provider]  spironolactone  (ALDACTONE ) 50 MG tablet Take 50 mg by mouth daily. 09/09/21   [provider]  Tenapanor HCl (IBSRELA ) 50 MG TABS Take 50 mg by mouth 2 (two) times daily before a meal. 01/23/22   Armbruster, Elspeth SQUIBB, MD  traMADol  (ULTRAM ) 50 MG tablet Take 50 mg by mouth every 12 (twelve) hours as needed.    [provider]    Physical Exam: Vitals:   05/11/24 1215 05/11/24 1400 05/11/24 1415  BP: (!) 178/81    Pulse: 89 86 88  Resp: 20    Temp: 98.5 F (36.9 C)    TempSrc: Oral    SpO2: 100% 92% 95%  Weight: 72.1 kg    Height: 5' 7 (1.702 m)      Constitutional: NAD, calm, comfortable Vitals:   05/11/24 1215 05/11/24 1400 05/11/24 1415  BP: (!) 178/81    Pulse: 89 86 88  Resp: 20    Temp: 98.5 F (36.9 C)    TempSrc: Oral    SpO2: 100% 92% 95%  Weight: 72.1 kg    Height: 5' 7 (1.702 m)     Eyes: PERRL, lids and conjunctivae normal ENMT: Mucous membranes are moist.  Neck: normal, supple, no masses, no thyromegaly Respiratory: clear to auscultation bilaterally, no wheezing, no crackles. Normal respiratory effort. No accessory muscle use.  Cardiovascular: Regular rate and rhythm, no murmurs / rubs / gallops. No extremity edema.  Abdomen: no tenderness, no masses palpated. No hepatosplenomegaly.  Musculoskeletal: no clubbing / cyanosis. No joint deformity upper and lower extremities.  Skin: no rashes, lesions, ulcers. No induration Neurologic: No facial asymmetry, moving extremity spontaneously, speech fluent. Psychiatric: Normal judgment and insight. Alert and oriented x 3. Normal mood.   Labs on Admission: I have personally reviewed following labs and imaging studies  CBC: Recent Labs  Lab 05/09/24 1744  05/11/24 1242  WBC 13.1* 15.3*  HGB 16.9* 17.0*  HCT 52.1* 52.7*  MCV 88.3 89.2  PLT 291 253   Basic Metabolic Panel: Recent Labs  Lab 05/09/24 1744 05/11/24 1242  NA 137 137  K 4.3 4.7  CL 98 97*  CO2 22 19*  GLUCOSE 93 86  BUN 24* 22  CREATININE 1.15* 1.14*  CALCIUM 10.3 10.5*   GFR: Estimated Creatinine Clearance: 45.3 mL/min (A) (by C-G formula based on SCr of 1.14 mg/dL (H)). Liver Function Tests: Recent Labs  Lab 05/09/24 1744 05/11/24 1242  AST 27 72*  ALT 53* 246*  ALKPHOS 124 221*  BILITOT 0.6 1.2  PROT 7.9 8.1  ALBUMIN  4.8 4.6   Recent Labs  Lab 05/09/24 1744 05/11/24 1242  LIPASE 119* 32   Urine analysis:    Component Value Date/Time   COLORURINE YELLOW 05/11/2024 1219   APPEARANCEUR CLEAR 05/11/2024 1219   LABSPEC 1.026 05/11/2024 1219   PHURINE 5.0 05/11/2024 1219   GLUCOSEU >=500 (A) 05/11/2024 1219   HGBUR SMALL (A) 05/11/2024 1219   BILIRUBINUR NEGATIVE 05/11/2024 1219   BILIRUBINUR negative 04/18/2019 1553   KETONESUR 20 (A) 05/11/2024 1219   PROTEINUR NEGATIVE 05/11/2024 1219   UROBILINOGEN 0.2 04/18/2019 1553   NITRITE NEGATIVE 05/11/2024 1219   LEUKOCYTESUR NEGATIVE 05/11/2024 1219    Radiological Exams on Admission: No results found.  EKG: None.   Assessment/Plan Principal Problem:   Intractable abdominal pain   Assessment and Plan: No notes have been filed under this hospital service. Service: Hospitalist  Intractable abdominal pain and vomiting-of 4 days duration.  ED visit, CTAP WC 12/26-no acute abnormality.  Today mildly elevated liver enzymes-AST 72, ALT 246, ALP 221, normal T. bili 1.2.  MRCP-final read pending, preliminary read negative for acute abnormality.  Differentials include hepatitis, drug-induced -Mounjaro, gastritis, gastroparesis. - Trend liver enzymes -  Acute hepatitis panel - IV Dilaudid  0.5-  q3 hr as needed -Zofran  as needed - 500ml bolus, continue D5 normal saline 100cc/hr x 20hrs - Bowel rest  with clear liquid diet - IV Protonix  40 daily - If persistent abdominal pain and vomiting, may need GI consult. - Hold Crestor, Mounjaro  Prediabetes-recent A1c 04/17/24 -6 - CBG q8hr - Hold Mounjaro, Jardiance  Hypertension-elevated - Resume spironolactone  50 mg daily - As needed labetalol 10mg  for systolic greater than 170  Paroxysmal atrial fibrillation - follows with Dr. Alvan, per notes anticoagulation was stopped due to isolated events with no clear recurrence.   - On PRN diltiazem , has very rarely ever used it  Hypothyroidism - Resume Synthroid    DVT prophylaxis: Lovenox  Code Status: Full code Family Communication: None at bedside Disposition Plan: ~ 2 days Consults called: None Admission status:  Obs med-surg    Author: Tully FORBES Carwin, MD 05/11/2024 9:40 PM  For on call review [email or web address].      [1]  Allergies Allergen Reactions   Semaglutide Other (See Comments)    Pancreatitis with Lipase of 499.   "

## 2024-05-11 NOTE — ED Notes (Signed)
 Lab at bedside

## 2024-05-11 NOTE — ED Provider Notes (Signed)
 " [place] EMERGENCY DEPARTMENT AT [facility] Provider Note   CSN: [id number] Arrival date & time: 05/11/24  1106     Patient presents with: Abdominal Pain   Stephanie Ewing is a 68 y.o. female.   68 year old female history of pancreatitis, diabetes on Mounjaro and Jardiance, GERD, and cholecystectomy who presents to the emergency department with epigastric abdominal pain.  Has had intermittent epigastric abdominal pain and nausea and vomiting for the past 4 days.  5/10 in severity.  Radiates like a band across her upper abdomen to her back.  Also associated with nausea and vomiting.  Came into the emergency department 2 days ago and had negative head CT but a lipase that was elevated at 119.  Today he had 3-4 episodes of nonbloody nonbilious emesis but did notice some green vomit yesterday.  No alcohol use.  No heavy NSAID use.  Is on Protonix  for her gastritis and has been compliant       Prior to Admission medications  Medication Sig Start Date End Date Taking? Authorizing Provider  acetaminophen  (TYLENOL ) 325 MG tablet Take 2 tablets (650 mg total) by mouth every 6 (six) hours. Patient taking differently: Take 650 mg by mouth every 6 (six) hours as needed. 04/15/20   Macario Dorothyann CHRISTELLA, MD  albuterol  (VENTOLIN  HFA) [phone] Base) MCG/ACT inhaler Inhale into the lungs every 6 (six) hours as needed for wheezing or shortness of breath.    [provider]  ALPRAZolam (XANAX) 0.5 MG tablet Take 0.5 mg by mouth 3 (three) times daily as needed. 03/22/23   [provider]  aspirin  EC 81 MG tablet Take 1 tablet (81 mg total) by mouth at bedtime. 07/27/20   Costella, Vincent J, PA-C  baclofen  (LIORESAL ) 10 MG tablet Take 1 tablet (10 mg total) by mouth at bedtime. 07/10/23   Mollie Nestor CHRISTELLA, PA-C  Cholecalciferol (VITAMIN D) 50 MCG (2000 UT) CAPS Take 2,000 Units by mouth in the morning.    [provider]  citalopram  (CELEXA ) 40 MG tablet Take 40 mg by  mouth daily. 07/25/23   [provider]  Cyanocobalamin (B-12) 5000 MCG SUBL Take 1 tablet by mouth daily.    [provider]  diltiazem  (CARDIZEM ) 30 MG tablet Take 1 tablet every 4 hours AS NEEDED for heart rate >100 as long as top blood pressure >100. 09/19/19   Dow Arland BROCKS, NP  famotidine  (PEPCID ) 40 MG tablet TAKE 1 TABLET(40 MG) BY MOUTH AT BEDTIME 09/25/23   McMichael, Bayley M, PA-C  fexofenadine (ALLEGRA) 180 MG tablet Take 180 mg by mouth in the morning.    [provider]  gabapentin  (NEURONTIN ) 600 MG tablet Take 600 mg by mouth at bedtime. 04/13/22   [provider]  HYDROMET 5-1.5 MG/5ML syrup Take 5 mLs by mouth 3 (three) times daily as needed. 03/31/22   [provider]  JARDIANCE 10 MG TABS tablet Take 10 mg by mouth daily.    [provider]  levothyroxine  (SYNTHROID ) 175 MCG tablet Take 175 mcg by mouth daily before breakfast.    [provider]  meloxicam (MOBIC) 15 MG tablet Take 15 mg by mouth daily as needed. 04/17/22   [provider]  montelukast  (SINGULAIR ) 10 MG tablet Take 10 mg by mouth at bedtime.    [provider]  MOUNJARO 10 MG/0.5ML Pen Inject 10 mg into the skin once a week. 03/11/24   [provider]  oxyCODONE  (ROXICODONE ) 5 MG immediate  release tablet Take 1 tablet (5 mg total) by mouth every 4 (four) hours as needed. 05/09/24   Donnajean Lynwood DEL, PA-C  pantoprazole  (PROTONIX ) 40 MG tablet TAKE 1 TABLET BY MOUTH TWICE DAILY BEFORE A MEAL 01/08/24   McMichael, Bayley M, PA-C  pramipexole (MIRAPEX) 0.5 MG tablet Take 0.5 mg by mouth at bedtime. 02/14/22   [provider]  QUEtiapine  (SEROQUEL ) 50 MG tablet Take 50 mg by mouth at bedtime. 07/26/23   [provider]  RESTASIS  0.05 % ophthalmic emulsion Place 1 drop into both eyes 2 (two) times daily. 07/29/19   [provider]  rosuvastatin (CRESTOR) 5 MG tablet Take 5 mg by mouth daily.    [provider]  sodium chloride  (OCEAN) 0.65 % SOLN nasal spray Place 1 spray into both nostrils as needed for congestion.    [provider]  spironolactone  (ALDACTONE ) 50 MG tablet Take 50 mg by mouth daily. 09/09/21   [provider]  Tenapanor HCl (IBSRELA ) 50 MG TABS Take 50 mg by mouth 2 (two) times daily before a meal. 01/23/22   Armbruster, Elspeth SQUIBB, MD  traMADol  (ULTRAM ) 50 MG tablet Take 50 mg by mouth every 12 (twelve) hours as needed.    [provider]    Allergies: Semaglutide    Review of Systems  Updated Vital Signs BP (!) 178/81 (BP Location: Right Arm)   Pulse 88   Temp 98.5 F (36.9 C) (Oral)   Resp 20   Ht 5' 7 (1.702 m)   Wt 72.1 kg   LMP  (LMP Unknown)   SpO2 95%   BMI 24.90 kg/m   Physical Exam Vitals and nursing note reviewed.  Constitutional:      General: She is not in acute distress.    Appearance: She is well-developed.  HENT:     Head: Normocephalic and atraumatic.     Right Ear: External ear normal.     Left Ear: External ear normal.     Nose: Nose normal.  Eyes:     Extraocular Movements: Extraocular movements intact.     Conjunctiva/sclera: Conjunctivae normal.     Pupils: Pupils are equal, round, and reactive to light.  Abdominal:     General: Abdomen is flat. There is no distension.     Palpations: Abdomen is soft. There is no mass.     Tenderness: There is abdominal tenderness (epigastrium). There is no guarding.  Musculoskeletal:     Cervical back: Normal range of motion and neck supple.     Right lower leg: No edema.     Left lower leg: No edema.  Skin:    General: Skin is warm and dry.  Neurological:     Mental Status: She is alert and oriented to person, place, and time. Mental status is at baseline.  Psychiatric:        Mood and Affect: Mood normal.     (all labs ordered are listed, but only abnormal results are displayed) Labs Reviewed  COMPREHENSIVE METABOLIC PANEL WITH GFR - Abnormal; Notable  for the following components:      Result Value   Chloride 97 (*)    CO2 19 (*)    Creatinine, Ser 1.14 (*)    Calcium 10.5 (*)    AST 72 (*)    ALT 246 (*)    Alkaline Phosphatase 221 (*)    GFR, Estimated 52 (*)    Anion gap 21 (*)    All other components  within normal limits  CBC - Abnormal; Notable for the following components:   WBC 15.3 (*)    RBC 5.91 (*)    Hemoglobin 17.0 (*)    HCT 52.7 (*)    All other components within normal limits  URINALYSIS, ROUTINE W REFLEX MICROSCOPIC - Abnormal; Notable for the following components:   Glucose, UA >=500 (*)    Hgb urine dipstick SMALL (*)    Ketones, ur 20 (*)    Bacteria, UA RARE (*)    All other components within normal limits  LIPASE, BLOOD    EKG: None  Radiology:   Procedures   Medications Ordered in the ED  oxyCODONE -acetaminophen  (PERCOCET/ROXICET) 5-325 MG per tablet 1 tablet (1 tablet Oral Given 05/11/24 1221)  ondansetron  (ZOFRAN -ODT) disintegrating tablet 4 mg (4 mg Oral Given 05/11/24 1221)  HYDROmorphone  (DILAUDID ) injection 0.5 mg (0.5 mg Intravenous Given 05/11/24 1354)  ondansetron  (ZOFRAN ) injection 4 mg (4 mg Intravenous Given 05/11/24 1354)  gadobutrol  (GADAVIST ) 1 MMOL/ML injection 7.5 mL (7.5 mLs Intravenous Contrast Given 05/11/24 1444)  HYDROmorphone  (DILAUDID ) injection 0.5 mg (0.5 mg Intravenous Given 05/11/24 1539)    Clinical Course as of 05/11/24 1652  Sun May 11, 2024  1607 Signed out to Dr Patsey.  [RP]    Clinical Course User Index [RP] Yolande Lamar BROCKS, MD                                 Medical Decision Making Amount and/or Complexity of Data Reviewed Labs: ordered. Radiology: ordered.  Risk Prescription drug management.   Stephanie Ewing is a 67 year old female history of pancreatitis, diabetes on Mounjaro and Jardiance, GERD, and cholecystectomy who presents to the emergency department with epigastric abdominal pain.   Initial Ddx:  Pancreatitis, cholecystitis,  cholangitis, medication side effect, peptic ulcer disease  MDM/Course:  Patient presents emergency department with epigastric abdominal pain.  Radiates around to her back.  Feels identical to when she has had pancreatitis in the past.  No heavy alcohol use.  Is on Mounjaro for diabetes.  Was seen here 2 days ago and had marginally elevated lipase but a CT scan of the abdomen pelvis that did not show acute findings.  Since going home reports that symptoms have persisted.  On exam does have some epigastric tenderness to palpation.  She is not in acute distress.  She had lab work that does show a leukocytosis of 15 along with mildly elevated AST, ALT, and alk phos.  Already has had a cholecystectomy.  Bilirubin is normal.  Her lipase is normal at 32.  Given this low concern for pancreatitis at this point in time.  With her epigastric abdominal pain and abdominal tenderness to palpation with elevated LFTs will obtain MRCP to eval for cholangitis/choledocholithiasis.upon reevaluation still is having pain so was given additional pain and nausea medicine.  This patient presents to the ED for concern of complaints listed in HPI, this involves an extensive number of treatment options, and is a complaint that carries with it a high risk of complications and morbidity. Disposition including potential need for admission considered.   Dispo: Pending remainder of workup  I have reviewed the patients home medications and made adjustments as needed Additional history obtained from spouse Records reviewed Outpatient Clinic Notes The following labs were independently interpreted: Chemistry and show elevated LFTs I personally reviewed and interpreted cardiac monitoring: normal sinus rhythm  I personally reviewed and interpreted  the pt's EKG: see above for interpretation  Social Determinants of health:  Geriatric  Portions of this note were generated with Scientist, clinical (histocompatibility and immunogenetics). Dictation errors may occur despite  best attempts at proofreading.     Final diagnoses:  Epigastric abdominal pain  Elevated LFTs    ED Discharge Orders     None          Yolande Lamar BROCKS, MD 05/11/24 1652  "

## 2024-05-11 NOTE — ED Triage Notes (Signed)
 Pt presents with abd pain that started 12/24 with N/V. Pt was seen yesterday in the ED and dx with pancreatitis. Pt has returned today because symptoms have worsened and she has been vomiting and unable to keep down fluids.

## 2024-05-11 NOTE — ED Provider Notes (Addendum)
" °  Physical Exam  BP (!) 178/81 (BP Location: Right Arm)   Pulse 88   Temp 98.5 F (36.9 C) (Oral)   Resp 20   Ht 5' 7 (1.702 m)   Wt 72.1 kg   LMP  (LMP Unknown)   SpO2 95%   BMI 24.90 kg/m   Physical Exam  Procedures  Procedures  ED Course / MDM   Clinical Course as of 05/11/24 1609  Sun May 11, 2024  1607 Signed out to Dr Patsey.  [RP]    Clinical Course User Index [RP] Yolande Lamar BROCKS, MD   Medical Decision Making Amount and/or Complexity of Data Reviewed Labs: ordered. Radiology: ordered.  Risk Prescription drug management. Decision regarding hospitalization.   Abdominal pain nausea vomiting now with elevated LFTs.  Lipase elevated recently  Preliminary read of MRCP is negative for choledocholithiasis or other significant abnormality.  However having continued pain continued vomiting.  Will discuss with hospitalist for admission.        Patsey Lot, MD 05/11/24 1609    Patsey Lot, MD 05/11/24 1927  "

## 2024-05-12 DIAGNOSIS — Z981 Arthrodesis status: Secondary | ICD-10-CM | POA: Diagnosis not present

## 2024-05-12 DIAGNOSIS — K2970 Gastritis, unspecified, without bleeding: Secondary | ICD-10-CM | POA: Diagnosis present

## 2024-05-12 DIAGNOSIS — R1013 Epigastric pain: Principal | ICD-10-CM

## 2024-05-12 DIAGNOSIS — K760 Fatty (change of) liver, not elsewhere classified: Secondary | ICD-10-CM | POA: Diagnosis present

## 2024-05-12 DIAGNOSIS — K863 Pseudocyst of pancreas: Secondary | ICD-10-CM | POA: Diagnosis present

## 2024-05-12 DIAGNOSIS — J45909 Unspecified asthma, uncomplicated: Secondary | ICD-10-CM | POA: Diagnosis present

## 2024-05-12 DIAGNOSIS — Z833 Family history of diabetes mellitus: Secondary | ICD-10-CM | POA: Diagnosis not present

## 2024-05-12 DIAGNOSIS — K219 Gastro-esophageal reflux disease without esophagitis: Secondary | ICD-10-CM | POA: Diagnosis present

## 2024-05-12 DIAGNOSIS — R109 Unspecified abdominal pain: Secondary | ICD-10-CM | POA: Diagnosis not present

## 2024-05-12 DIAGNOSIS — E269 Hyperaldosteronism, unspecified: Secondary | ICD-10-CM | POA: Diagnosis present

## 2024-05-12 DIAGNOSIS — F17210 Nicotine dependence, cigarettes, uncomplicated: Secondary | ICD-10-CM | POA: Diagnosis present

## 2024-05-12 DIAGNOSIS — Z7985 Long-term (current) use of injectable non-insulin antidiabetic drugs: Secondary | ICD-10-CM | POA: Diagnosis not present

## 2024-05-12 DIAGNOSIS — F32A Depression, unspecified: Secondary | ICD-10-CM | POA: Diagnosis present

## 2024-05-12 DIAGNOSIS — Z8249 Family history of ischemic heart disease and other diseases of the circulatory system: Secondary | ICD-10-CM | POA: Diagnosis not present

## 2024-05-12 DIAGNOSIS — R7989 Other specified abnormal findings of blood chemistry: Secondary | ICD-10-CM

## 2024-05-12 DIAGNOSIS — Z79899 Other long term (current) drug therapy: Secondary | ICD-10-CM | POA: Diagnosis not present

## 2024-05-12 DIAGNOSIS — Z7984 Long term (current) use of oral hypoglycemic drugs: Secondary | ICD-10-CM | POA: Diagnosis not present

## 2024-05-12 DIAGNOSIS — E119 Type 2 diabetes mellitus without complications: Secondary | ICD-10-CM | POA: Diagnosis present

## 2024-05-12 DIAGNOSIS — R7401 Elevation of levels of liver transaminase levels: Secondary | ICD-10-CM | POA: Diagnosis not present

## 2024-05-12 DIAGNOSIS — K838 Other specified diseases of biliary tract: Secondary | ICD-10-CM | POA: Diagnosis present

## 2024-05-12 DIAGNOSIS — I480 Paroxysmal atrial fibrillation: Secondary | ICD-10-CM | POA: Diagnosis present

## 2024-05-12 DIAGNOSIS — Z7982 Long term (current) use of aspirin: Secondary | ICD-10-CM | POA: Diagnosis not present

## 2024-05-12 DIAGNOSIS — Z888 Allergy status to other drugs, medicaments and biological substances status: Secondary | ICD-10-CM | POA: Diagnosis not present

## 2024-05-12 DIAGNOSIS — R748 Abnormal levels of other serum enzymes: Secondary | ICD-10-CM | POA: Diagnosis not present

## 2024-05-12 DIAGNOSIS — I10 Essential (primary) hypertension: Secondary | ICD-10-CM | POA: Diagnosis present

## 2024-05-12 DIAGNOSIS — E039 Hypothyroidism, unspecified: Secondary | ICD-10-CM | POA: Diagnosis present

## 2024-05-12 DIAGNOSIS — K859 Acute pancreatitis without necrosis or infection, unspecified: Secondary | ICD-10-CM | POA: Diagnosis present

## 2024-05-12 DIAGNOSIS — L4059 Other psoriatic arthropathy: Secondary | ICD-10-CM | POA: Diagnosis present

## 2024-05-12 DIAGNOSIS — Z79890 Hormone replacement therapy (postmenopausal): Secondary | ICD-10-CM | POA: Diagnosis not present

## 2024-05-12 LAB — COMPREHENSIVE METABOLIC PANEL WITH GFR
ALT: 148 U/L — ABNORMAL HIGH (ref 0–44)
AST: 39 U/L (ref 15–41)
Albumin: 3.9 g/dL (ref 3.5–5.0)
Alkaline Phosphatase: 166 U/L — ABNORMAL HIGH (ref 38–126)
Anion gap: 14 (ref 5–15)
BUN: 18 mg/dL (ref 8–23)
CO2: 20 mmol/L — ABNORMAL LOW (ref 22–32)
Calcium: 9.5 mg/dL (ref 8.9–10.3)
Chloride: 101 mmol/L (ref 98–111)
Creatinine, Ser: 0.91 mg/dL (ref 0.44–1.00)
GFR, Estimated: >60 mL/min
Glucose, Bld: 139 mg/dL — ABNORMAL HIGH (ref 70–99)
Potassium: 4.1 mmol/L (ref 3.5–5.1)
Sodium: 135 mmol/L (ref 135–145)
Total Bilirubin: 0.6 mg/dL (ref 0.0–1.2)
Total Protein: 6.6 g/dL (ref 6.5–8.1)

## 2024-05-12 LAB — CBC WITH DIFFERENTIAL/PLATELET
Abs Immature Granulocytes: 0.04 K/uL (ref 0.00–0.07)
Basophils Absolute: 0 K/uL (ref 0.0–0.1)
Basophils Relative: 0 %
Eosinophils Absolute: 0.2 K/uL (ref 0.0–0.5)
Eosinophils Relative: 2 %
HCT: 44.2 % (ref 36.0–46.0)
Hemoglobin: 14.6 g/dL (ref 12.0–15.0)
Immature Granulocytes: 0 %
Lymphocytes Relative: 14 %
Lymphs Abs: 1.5 K/uL (ref 0.7–4.0)
MCH: 29.1 pg (ref 26.0–34.0)
MCHC: 33 g/dL (ref 30.0–36.0)
MCV: 88 fL (ref 80.0–100.0)
Monocytes Absolute: 0.9 K/uL (ref 0.1–1.0)
Monocytes Relative: 8 %
Neutro Abs: 8 K/uL — ABNORMAL HIGH (ref 1.7–7.7)
Neutrophils Relative %: 76 %
Platelets: 234 K/uL (ref 150–400)
RBC: 5.02 MIL/uL (ref 3.87–5.11)
RDW: 13.6 % (ref 11.5–15.5)
WBC: 10.7 K/uL — ABNORMAL HIGH (ref 4.0–10.5)
nRBC: 0 % (ref 0.0–0.2)

## 2024-05-12 LAB — HEPATITIS PANEL, ACUTE
HCV Ab: NONREACTIVE
Hep A IgM: NONREACTIVE
Hep B C IgM: NONREACTIVE
Hepatitis B Surface Ag: NONREACTIVE

## 2024-05-12 LAB — GLUCOSE, CAPILLARY: Glucose-Capillary: 71 mg/dL (ref 70–99)

## 2024-05-12 MED ORDER — ACETAMINOPHEN 650 MG RE SUPP: 650.0000 mg | Freq: Four times a day (QID) | RECTAL | Status: DC | PRN

## 2024-05-12 MED ORDER — POLYETHYLENE GLYCOL 3350 17 G PO PACK: 17.0000 g | PACK | Freq: Every day | ORAL | Status: DC | PRN

## 2024-05-12 MED ORDER — DIPHENHYDRAMINE HCL 50 MG/ML IJ SOLN
12.5000 mg | Freq: Once | INTRAMUSCULAR | Status: AC
  Administered 2024-05-12: 12.5 mg via INTRAVENOUS
  Filled 2024-05-12: qty 1

## 2024-05-12 MED ORDER — GABAPENTIN 300 MG PO CAPS
600.0000 mg | ORAL_CAPSULE | Freq: Every day | ORAL | Status: DC
  Administered 2024-05-12: 600 mg via ORAL
  Filled 2024-05-12: qty 2

## 2024-05-12 MED ORDER — LEVOTHYROXINE SODIUM 50 MCG PO TABS
175.0000 ug | ORAL_TABLET | Freq: Every day | ORAL | Status: DC
  Administered 2024-05-13: 175 ug via ORAL
  Filled 2024-05-12: qty 1

## 2024-05-12 MED ORDER — SPIRONOLACTONE 25 MG PO TABS
50.0000 mg | ORAL_TABLET | Freq: Every day | ORAL | Status: DC
  Administered 2024-05-12: 50 mg via ORAL
  Filled 2024-05-12 (×2): qty 2

## 2024-05-12 MED ORDER — MORPHINE SULFATE (PF) 2 MG/ML IV SOLN
2.0000 mg | INTRAVENOUS | Status: DC | PRN
  Administered 2024-05-12 – 2024-05-13 (×3): 2 mg via INTRAVENOUS
  Filled 2024-05-12 (×3): qty 1

## 2024-05-12 MED ORDER — ENOXAPARIN SODIUM 40 MG/0.4ML IJ SOSY
40.0000 mg | PREFILLED_SYRINGE | INTRAMUSCULAR | Status: DC
  Administered 2024-05-12: 40 mg via SUBCUTANEOUS
  Filled 2024-05-12: qty 0.4

## 2024-05-12 MED ORDER — ONDANSETRON HCL 4 MG PO TABS: 4.0000 mg | ORAL_TABLET | Freq: Four times a day (QID) | ORAL | Status: DC | PRN

## 2024-05-12 MED ORDER — ONDANSETRON HCL 4 MG/2ML IJ SOLN
4.0000 mg | Freq: Four times a day (QID) | INTRAMUSCULAR | Status: DC | PRN
  Administered 2024-05-12 – 2024-05-13 (×2): 4 mg via INTRAVENOUS
  Filled 2024-05-12 (×3): qty 2

## 2024-05-12 MED ORDER — CITALOPRAM HYDROBROMIDE 20 MG PO TABS
40.0000 mg | ORAL_TABLET | Freq: Every day | ORAL | Status: DC
  Administered 2024-05-12 – 2024-05-13 (×2): 40 mg via ORAL
  Filled 2024-05-12 (×2): qty 2

## 2024-05-12 MED ORDER — ACETAMINOPHEN 325 MG PO TABS: 650.0000 mg | ORAL_TABLET | Freq: Four times a day (QID) | ORAL | Status: DC | PRN

## 2024-05-12 MED ORDER — QUETIAPINE FUMARATE 25 MG PO TABS
50.0000 mg | ORAL_TABLET | Freq: Every day | ORAL | Status: DC
  Administered 2024-05-12: 50 mg via ORAL
  Filled 2024-05-12: qty 2

## 2024-05-12 MED ORDER — ONDANSETRON HCL 4 MG/2ML IJ SOLN: 4.0000 mg | Freq: Four times a day (QID) | INTRAMUSCULAR | Status: DC | PRN

## 2024-05-12 MED ORDER — LACTATED RINGERS IV SOLN: INTRAVENOUS | Status: DC

## 2024-05-12 MED ORDER — ALPRAZOLAM 0.5 MG PO TABS: 0.5000 mg | ORAL_TABLET | Freq: Three times a day (TID) | ORAL | Status: DC | PRN

## 2024-05-12 NOTE — Consult Note (Signed)
 "  Gastroenterology Consult   Referring Provider: Dr. Mcarthur, [facility] hospitalist Primary Care Physician:  Leavy Waddell NOVAK, FNP Primary Gastroenterologist:  [facility], Dr. Leigh  Patient ID: Stephanie Ewing; [id number]; [birth year]   Admit date: 05/11/2024  LOS: 0 days   Date of Consultation: 05/12/2024  Reason for Consultation:  Acute pancreatitis, Elevated LFTs  History of Present Illness   Stephanie Ewing is a 68 y.o. year old female with a history of afib, psoriatic arthritis, constipation, GERD, duodenal adenoma, HTN, pancreatitis initially diagnosed in 2021 after starting methotrexate, hypothyroidism, DM, colonic adenomas, concern for pancreatic head mass s/p evaluation as below and evaluated at both [facility] with EUS/FNA and [facility] in April 2022, presenting to the ED with acute pancreatitis and elevated LFTs. GI now consulted for further management.    She had presented to the ED initially on 12/26 with lipase mildly elevated at 119 and AST 53, otherwise normal LFTs, CT 12/26 with assymetric fatty infiltration of pancreatic head and body similar to prior and felt to be sequela of prior inflammation, symptoms improved with supportive measures and discharged without hospitalization. She presented again on 12/27 but left without being seen due to long wait times.  Again to the ED on 12/28 with lipase 32, bump in AST to 72 from 27, ALT 246 from 53, ALk Phos 221, Tbili 1.2. Creatinine stable at 1.14. Calcium marginally elevated at 10.5. Acute hepatitis panel negative. MRCP with and without contrast with acute pancreatitis and mild enlargement and edema of pancreatic head, unable to rule out possible early pseudocysts, mild surrounding inflammatory soft tissue stranding. Gallbladder absent with CBD dilated measuring up to 1.1 cm and mild intrahepatic biliary ductal dilatation, no CBD stones. Hepatic steatosis.   Today, she has had improvement in transaminases significantly, with AST now 39 (prior  72), ALT 148 (was 246), Alk Phos 166 down from 221, tbili 0.6 (was 1.2). Leukocytosis improved from high of 15.3 yesterday down to 10.7.    Today at time of consultation, patient is in the hallway with husband. Last pancreatitis episode in Aug 2024, which she states occurred when on Ozempic. She has since changed to Mounjaro and has been on this since February 2025, with recent dosage increase about a month ago.   Acute onset of symptoms 12/24, States she also went to the ED the 27th but due to crowded conditions, she went back home. Was unable to tolerate pain, so she presented again yesterday, 12/28. Gallbladder absent. Notes she has had worsening RUQ and epigastric pain. Pain is typically entire upper abdomen and wraps around her back, but she has predominantly RUQ pain moreso than epigastric and LUQ. No nausea this morning. Nausea on admission but now resolved. No vomiting. Feels like her pain is slowly improving from first onset on 12/24.   No ETOH use. +smoker. She has had about 35 lbs purposeful weight loss on Mounjaro. Previously on Ozempic in the past. Changed to Mounjaro around February. She just had increase in dose one month ago.   She is eager to try clear liquids.    Prior evaluation for pancreatitis:  Negative  IgG4, normal triglycerides, no ETOH MRCP feb 2022.  3.4 cm pancreatic head mass.   EUS Feb 2022 [place]: heterogeneous region in the pancreatic head, not clearly a mass, biopsied with FNA which showed atypical cells.   Repeat EUS March 2022:  rare atypical cells noted, no evidence of malignancy.   [facility] for second opinion at her request April 2022 EUS: no  mass, 2 small cyst in pancreas  Cholecystectomy Aug 2022  Follow up MRCP 07/2021: No pancreatic mass but she does have some fatty infiltration of the pancreas with some associated atrophy, and some subcentimeter cystic lesions in the pancreas, radiology recommended repeat MRI in 1 year.   MRCP March 2024:  1. 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 Repeat abdominal MRI with and without IV gadolinium with MRCP is recommended in 1 year to ensure continued stability. 2. Hepatic steatosis.  MRCP outpatient for surveillance Feb 2025: 1. Unchanged tiny fluid signal cystic lesions scattered throughout the pancreas measuring 0.6 cm and smaller. No solid component or suspicious contrast enhancement. No pancreatic ductal dilatation or surrounding inflammatory changes. These are consistent with small pseudocysts or side branch IPMNs, and given tiny size and well established imaging stability, these are benign and no further follow-up or characterization. 2. Hepatomegaly and marked hepatic steatosis. 3. Status post cholecystectomy.   Last endoscopic evaluations: Colonoscopy 04/2022: - Perianal skin tags found on perianal exam. - The examined portion of the ileum was normal. - Post- polypectomy scar in the cecum with residual hemostasis clip. - One 3 to 4 mm polyp in the ascending colon, removed with a cold snare. Resected and retrieved. - One 6 mm polyp in the rectum, removed with a cold snare. Resected and retrieved. - Internal hemorrhoids. - The examination was otherwise normal. Path: polypoid fragment of benign colonic mucosa with lymphoid aggregate, hyperplastic polyp Recommend 3 year surveillance    Colonoscopy May 2023: multiple polyps, one large (25 mm polyp s/p piecemeal removal and clip placement). Sessile serrated adenoma, other polyps sessile and tubular adenoma.   EGD 01/2022 - Esophagogastric landmarks identified. - 1 cm hiatal hernia. - Gastroesophageal flap valve classified as Hill Grade II ( fold present, opens with respiration) . - Normal esophagus otherwise - biopsies obtained. - Normal stomach. - A  single duodenal polyp. Resected and retrieved. - Normal duodenum otherwise.  Path: prominent Brunner's glands and focal foveolar metaplasia consistent with duodenitis, negative dysplasia Recommend surveillance EGD in 3-5 years due to history of duodenal adenoma   EGD Feb 2022 duodenal adenoma s/p removal    Past Medical History:  Diagnosis Date   Afib (HCC)    Arthritis    psoratic   Asthma    weak    Constipation    Depression    Dysrhythmia    AFIb   GERD (gastroesophageal reflux disease)    History of hiatal hernia    Hyperaldosteronism    Hypertension    Hypokalemia    Hypothyroidism    Insomnia    Migraine    ocassional   Nicotine  dependence, cigarettes, uncomplicated    Pancreatitis    PONV (postoperative nausea and vomiting)    Pre-diabetes    Thyroid disease    hypothyroid    Past Surgical History:  Procedure Laterality Date   ANTERIOR CERVICAL DECOMPRESSION/DISCECTOMY FUSION 4 LEVELS N/A 07/20/2020   Procedure: ANTERIOR CERVICAL DECOMPRESSION FUSION CERVICAL FOUR-CERVICAL FIVE, CERVICAL FIVE-CERVICAL SIX, CERVICAL SIX-CERVICAL SEVEN;  Surgeon: Lanis Pupa, MD;  Location: [facility];  Service: Neurosurgery;  Laterality: N/A;   BIOPSY  06/28/2020   Procedure: BIOPSY;  Surgeon: Wilhelmenia Aloha Raddle., MD;  Location: [facility];  Service: Gastroenterology;;   CHOLECYSTECTOMY N/A 12/30/2020   Procedure: LAPAROSCOPIC CHOLECYSTECTOMY;  Surgeon: Teresa Lonni HERO, MD;  Location: [facility];  Service: General;  Laterality: N/A;   elbow surgery Right    ESOPHAGOGASTRODUODENOSCOPY (EGD) WITH PROPOFOL  N/A 06/28/2020   Procedure: ESOPHAGOGASTRODUODENOSCOPY (EGD) WITH PROPOFOL ;  Surgeon: Wilhelmenia Aloha Raddle., MD;  Location: [facility];  Service: Gastroenterology;  Laterality: N/A;   ESOPHAGOGASTRODUODENOSCOPY (EGD) WITH PROPOFOL  N/A 08/05/2020   Procedure: ESOPHAGOGASTRODUODENOSCOPY (EGD) WITH PROPOFOL ;  Surgeon: Wilhelmenia Aloha Raddle., MD;  Location: [facility]  ENDOSCOPY;  Service: Gastroenterology;  Laterality: N/A;   EUS N/A 06/28/2020   Procedure: UPPER ENDOSCOPIC ULTRASOUND (EUS) LINEAR;  Surgeon: Wilhelmenia Aloha Raddle., MD;  Location: [facility];  Service: Gastroenterology;  Laterality: N/A;   EUS N/A 08/05/2020   Procedure: UPPER ENDOSCOPIC ULTRASOUND (EUS) RADIAL;  Surgeon: Wilhelmenia Aloha Raddle., MD;  Location: [facility] ENDOSCOPY;  Service: Gastroenterology;  Laterality: N/A;   FINE NEEDLE ASPIRATION  06/28/2020   Procedure: FINE NEEDLE ASPIRATION (FNA) LINEAR;  Surgeon: Wilhelmenia Aloha Raddle., MD;  Location: [facility];  Service: Gastroenterology;;   FINE NEEDLE ASPIRATION  08/05/2020   Procedure: FINE NEEDLE ASPIRATION;  Surgeon: Wilhelmenia Aloha Raddle., MD;  Location: [facility] ENDOSCOPY;  Service: Gastroenterology;;   POLYPECTOMY  06/28/2020   Procedure: POLYPECTOMY;  Surgeon: Wilhelmenia Aloha Raddle., MD;  Location: [facility];  Service: Gastroenterology;;   SHOULDER SURGERY Bilateral    inpingement release   TOTAL ABDOMINAL HYSTERECTOMY  2015   ULNAR NERVE REPAIR Right     Prior to Admission medications  Medication Sig Start Date End Date Taking? Authorizing Provider  acetaminophen  (TYLENOL ) 325 MG tablet Take 2 tablets (650 mg total) by mouth every 6 (six) hours. Patient taking differently: Take 650 mg by mouth every 6 (six) hours as needed. 04/15/20  Yes Macario Dorothyann HERO, MD  albuterol  (VENTOLIN  HFA) 108 [phone] Base) MCG/ACT inhaler Inhale into the lungs every 6 (six) hours as needed for wheezing or shortness of breath.   Yes [provider]  ALPRAZolam (XANAX) 0.5 MG tablet Take 0.5 mg by mouth 3 (three) times daily as needed. 03/22/23  Yes [provider]  aspirin  EC 81 MG tablet Take 1 tablet (81 mg total) by mouth at bedtime. 07/27/20  Yes Costella, Vincent J, PA-C  baclofen  (LIORESAL ) 10 MG tablet Take 1 tablet (10 mg total) by mouth at bedtime. 07/10/23  Yes McMichael, Bayley M, PA-C  Cholecalciferol (VITAMIN D) 50 MCG  (2000 UT) CAPS Take 2,000 Units by mouth in the morning.   Yes [provider]  citalopram  (CELEXA ) 40 MG tablet Take 40 mg by mouth daily. 07/25/23  Yes [provider]  Cyanocobalamin (B-12) 5000 MCG SUBL Take 1 tablet by mouth daily.   Yes [provider]  diltiazem  (CARDIZEM ) 30 MG tablet Take 1 tablet every 4 hours AS NEEDED for heart rate >100 as long as top blood pressure >100. 09/19/19  Yes Dow Stephanie BROCKS, NP  fexofenadine (ALLEGRA) 180 MG tablet Take 180 mg by mouth in the morning.   Yes [provider]  gabapentin  (NEURONTIN ) 600 MG tablet Take 600 mg by mouth at bedtime. 04/13/22  Yes [provider]  HYDROMET 5-1.5 MG/5ML syrup Take 5 mLs by mouth 3 (three) times daily as needed. 03/31/22  Yes [provider]  JARDIANCE 10 MG TABS tablet Take 10 mg by mouth daily.   Yes [provider]  levothyroxine  (SYNTHROID ) 175 MCG tablet Take 175 mcg by mouth daily before breakfast.   Yes [provider]  meloxicam (MOBIC) 15 MG tablet Take 15 mg by mouth daily as needed. 04/17/22  Yes [provider]  montelukast  (SINGULAIR ) 10 MG tablet Take 10 mg by mouth daily.   Yes [provider]  MOUNJARO 12.5 MG/0.5ML Pen Inject 12.5 mg into the skin once a week. 05/06/24  Yes [provider]  oxyCODONE  (ROXICODONE ) 5 MG immediate release tablet Take 1 tablet (5 mg total) by mouth every 4 (four) hours as needed. 05/09/24  Yes Donnajean Lynwood DEL, PA-C  pantoprazole  (PROTONIX ) 40 MG tablet TAKE 1 TABLET BY MOUTH TWICE DAILY BEFORE A MEAL 01/08/24  Yes McMichael, Bayley M, PA-C  pramipexole (MIRAPEX) 0.5 MG tablet Take 0.5 mg by mouth at bedtime. 02/14/22  Yes [provider]  QUEtiapine  (SEROQUEL ) 50 MG tablet Take 50 mg by mouth at bedtime. 07/26/23  Yes [provider]  rosuvastatin (CRESTOR) 5 MG tablet Take 5 mg by mouth daily.   Yes [provider]  sodium chloride  (OCEAN) 0.65 % SOLN  nasal spray Place 1 spray into both nostrils as needed for congestion.   Yes [provider]  spironolactone  (ALDACTONE ) 25 MG tablet Take 25 mg by mouth daily. 04/21/24 04/16/25 Yes [provider]  traMADol  (ULTRAM ) 50 MG tablet Take 50 mg by mouth every 12 (twelve) hours as needed.   Yes [provider]    Current Facility-Administered Medications  Medication Dose Route Frequency Provider Last Rate Last Admin   HYDROmorphone  (DILAUDID ) injection 0.5 mg  0.5 mg Intravenous Q3H PRN Emokpae, Ejiroghene E, MD   0.5 mg at 05/12/24 0732   lactated ringers  infusion   Intravenous Continuous Sigdel, Santosh, MD 150 mL/hr at 05/12/24 0856 New Bag at 05/12/24 0856   ondansetron  (ZOFRAN ) injection 4 mg  4 mg Intravenous Q6H PRN Sigdel, Santosh, MD       pantoprazole  (PROTONIX ) injection 40 mg  40 mg Intravenous Q24H Emokpae, Ejiroghene E, MD   40 mg at 05/11/24 2156   Current Outpatient Medications  Medication Sig Dispense Refill   acetaminophen  (TYLENOL ) 325 MG tablet Take 2 tablets (650 mg total) by mouth every 6 (six) hours. (Patient taking differently: Take 650 mg by mouth every 6 (six) hours as needed.)     albuterol  (VENTOLIN  HFA) 108 (90 Base) MCG/ACT inhaler Inhale into the lungs every 6 (six) hours as needed for wheezing or shortness of breath.     ALPRAZolam (XANAX) 0.5 MG tablet Take 0.5 mg by mouth 3 (three) times daily as needed.     aspirin  EC 81 MG tablet Take 1 tablet (81 mg total) by mouth at bedtime. 30 tablet 11   baclofen  (LIORESAL ) 10 MG tablet Take 1 tablet (10 mg total) by mouth at bedtime. 90 tablet 3   Cholecalciferol (VITAMIN D) 50 MCG (2000 UT) CAPS Take 2,000 Units by mouth in the morning.     citalopram  (CELEXA ) 40 MG tablet Take 40 mg by mouth daily.     Cyanocobalamin (B-12) 5000 MCG SUBL Take 1 tablet by mouth daily.     diltiazem  (CARDIZEM ) 30 MG tablet Take 1 tablet every 4 hours AS NEEDED for heart rate >100 as long as top blood pressure >100.  45 tablet 1   fexofenadine (ALLEGRA) 180 MG tablet Take 180 mg by mouth in the morning.     gabapentin  (NEURONTIN ) 600 MG tablet Take 600 mg by mouth at bedtime.     HYDROMET 5-1.5 MG/5ML syrup Take 5 mLs by mouth 3 (three) times daily as needed.     JARDIANCE 10 MG TABS tablet Take 10 mg by mouth daily.     levothyroxine  (  SYNTHROID ) 175 MCG tablet Take 175 mcg by mouth daily before breakfast.     meloxicam (MOBIC) 15 MG tablet Take 15 mg by mouth daily as needed.     montelukast  (SINGULAIR ) 10 MG tablet Take 10 mg by mouth daily.     MOUNJARO 12.5 MG/0.5ML Pen Inject 12.5 mg into the skin once a week.     oxyCODONE  (ROXICODONE ) 5 MG immediate release tablet Take 1 tablet (5 mg total) by mouth every 4 (four) hours as needed. 5 tablet 0   pantoprazole  (PROTONIX ) 40 MG tablet TAKE 1 TABLET BY MOUTH TWICE DAILY BEFORE A MEAL 60 tablet 3   pramipexole (MIRAPEX) 0.5 MG tablet Take 0.5 mg by mouth at bedtime.     QUEtiapine  (SEROQUEL ) 50 MG tablet Take 50 mg by mouth at bedtime.     rosuvastatin (CRESTOR) 5 MG tablet Take 5 mg by mouth daily.     sodium chloride  (OCEAN) 0.65 % SOLN nasal spray Place 1 spray into both nostrils as needed for congestion.     spironolactone  (ALDACTONE ) 25 MG tablet Take 25 mg by mouth daily.     traMADol  (ULTRAM ) 50 MG tablet Take 50 mg by mouth every 12 (twelve) hours as needed.      Allergies as of 05/11/2024 - Review Complete 05/11/2024  Allergen Reaction Noted   Semaglutide Other (See Comments) 02/05/2023    Family History  Problem Relation Age of Onset   Hypertension Mother    Thyroid disease Mother        hypothyroidism   Asthma Father    Heart disease Father        from scarlet fever; died age 23 MVA   Hypertension Sister    Hypothyroidism Sister    Other Maternal Aunt        fatty liver   Hypertension Maternal Grandmother    Heart failure Maternal Grandmother    Diabetes Paternal Grandmother    Heart failure Paternal Grandmother    Thyroid  cancer Cousin    Colon cancer Neg Hx    Esophageal cancer Neg Hx    Pancreatic cancer Neg Hx    Stomach cancer Neg Hx    Breast cancer Neg Hx    Rectal cancer Neg Hx     Social History   Socioeconomic History   Marital status: Married    Spouse name: Not on file   Number of children: Not on file   Years of education: Not on file   Highest education level: Not on file  Occupational History   Not on file  Tobacco Use   Smoking status: Every Day    Current packs/day: 1.00    Average packs/day: 1 pack/day for 53.2 years (53.2 ttl pk-yrs)    Types: Cigarettes    Start date: 03/12/1971   Smokeless tobacco: Never  Vaping Use   Vaping status: Never Used  Substance and Sexual Activity   Alcohol use: Not Currently   Drug use: Never   Sexual activity: Not on file  Other Topics Concern   Not on file  Social History Narrative   Not on file   Social Drivers of Health   Tobacco Use: High Risk (05/11/2024)   Patient History    Smoking Tobacco Use: Every Day    Smokeless Tobacco Use: Never    Passive Exposure: Not on file  Financial Resource Strain: Not on file  Food Insecurity: No Food Insecurity (12/20/2021)   Hunger Vital Sign    Worried About Running  Out of Food in the Last Year: Never true    Ran Out of Food in the Last Year: Never true  Transportation Needs: No Transportation Needs (12/20/2021)   PRAPARE - Administrator, Civil Service (Medical): No    Lack of Transportation (Non-Medical): No  Physical Activity: Not on file  Stress: Not on file  Social Connections: Not on file  Intimate Partner Violence: Not on file  Depression (EYV7-0): Not on file  Alcohol Screen: Not on file  Housing: Low Risk (12/20/2021)   Housing    Last Housing Risk Score: 0  Utilities: Not on file  Health Literacy: Not on file     Review of Systems   Gen: Denies any fever, chills, loss of appetite, change in weight or weight loss CV: Denies chest pain, heart palpitations,  syncope, edema  Resp: Denies shortness of breath with rest, cough, wheezing, coughing up blood, and pleurisy. GI: Denies vomiting blood, jaundice, and fecal incontinence.   Denies dysphagia or odynophagia. GU : Denies urinary burning, blood in urine, urinary frequency, and urinary incontinence. MS: Denies joint pain, limitation of movement, swelling, cramps, and atrophy.  Derm: Denies rash, itching, dry skin, hives. Psych: Denies depression, anxiety, memory loss, hallucinations, and confusion. Heme: Denies bruising or bleeding Neuro:  Denies any headaches, dizziness, paresthesias, shaking  Physical Exam   Vital Signs in last 24 hours: Temp:  [97.8 F (36.6 C)-98.6 F (37 C)] 98.5 F (36.9 C) (12/29 0720) Pulse Rate:  [75-89] 79 (12/29 0720) Resp:  [16-20] 18 (12/29 0720) BP: (124-189)/(61-96) 124/83 (12/29 0720) SpO2:  [91 %-100 %] 95 % (12/29 0720) Weight:  [72.1 kg] 72.1 kg (12/28 1215) Last BM Date : 05/11/24  General:   Alert,  Well-developed, well-nourished, pleasant and cooperative in NAD Head:  Normocephalic and atraumatic. Eyes:  Sclera clear, no icterus.   Conjunctiva pink. Lungs:  Clear throughout to auscultation.    Heart:  S1 S2 present, no obvious murmur Abdomen:  Soft, mildly TTP epigastric and RUQ, no rebound or guarding Rectal: deferred   Extremities:  Without  edema. Neurologic:  Alert and  oriented x4. Skin:  Intact without significant lesions or rashes. Psych:  Alert and cooperative. Normal mood and affect.  Intake/Output from previous day: 12/28 0701 - 12/29 0700 In: 1315 [I.V.:815; IV Piggyback:500] Out: -  Intake/Output this shift: Total I/O In: 276.7 [I.V.:276.7] Out: -     Labs/Studies   Recent Labs Recent Labs    05/09/24 1744 05/11/24 1242 05/12/24 0853  WBC 13.1* 15.3* 10.7*  HGB 16.9* 17.0* 14.6  HCT 52.1* 52.7* 44.2  PLT 291 253 234   BMET Recent Labs    05/09/24 1744 05/11/24 1242 05/12/24 0853  NA 137 137 135  K 4.3 4.7  4.1  CL 98 97* 101  CO2 22 19* 20*  GLUCOSE 93 86 139*  BUN 24* 22 18  CREATININE 1.15* 1.14* 0.91  CALCIUM 10.3 10.5* 9.5   LFT Recent Labs    05/09/24 1744 05/11/24 1242 05/12/24 0853  PROT 7.9 8.1 6.6  ALBUMIN  4.8 4.6 3.9  AST 27 72* 39  ALT 53* 246* 148*  ALKPHOS 124 221* 166*  BILITOT 0.6 1.2 0.6     Radiology/Studies MR ABDOMEN MRCP W WO CONTAST Result Date: 05/12/2024 EXAM: MRCP WITH AND WITHOUT IV CONTRAST 05/11/2024 02:51:57 PM TECHNIQUE: Multisequence, multiplanar magnetic resonance images of the abdomen with and without intravenous contrast. MRCP sequences were performed. 7.5 mL (gadobutrol  (GADAVIST ) 1 MMOL/ML injection  7.5 mL GADOBUTROL  1 MMOL/ML IV SOLN) was administered. COMPARISON: 05/09/2024 CLINICAL HISTORY: elevated LFTs abdominal pain FINDINGS: LIVER: Hepatic steatosis. GALLBLADDER AND BILIARY SYSTEM: Status post cholecystectomy. Dilatation of the common bile duct measures up to 1.1 cm with mild intrahepatic bile duct dilatation. No choledocholithiasis identified. SPLEEN: Unremarkable. PANCREAS/PANCREATIC DUCT: Interval development: There is mild enlargement and edema within the head of the pancreas with focal hyperechogenicity enhancement and mild surrounding inflammatory soft tissue stranding. Within the head of the pancreas, there are several mildly T2 hyperintense foci which measure up to 7 mm, possibly representing early pseudocysts in the setting of acute pancreatitis. There are a few scattered areas of side branch duct ectasia. Soft tissue stranding and fluid are identified along the pancreaticoduodenal groove. ADRENAL GLANDS: Unremarkable. KIDNEYS: Unremarkable. LYMPH NODES: No enlarged abdominal lymph nodes. VASCULATURE: Unremarkable. PERITONEUM: No ascites. ABDOMINAL WALL: No hernia. No mass. BOWEL: Grossly unremarkable. No bowel obstruction. BONES: No acute abnormality or worrisome osseous lesion. SOFT TISSUES: Unremarkable. MISCELLANEOUS: Unremarkable.  IMPRESSION: 1. Acute pancreatitis with mild enlargement and edema of the pancreatic head, possible early pseudocysts, and mild surrounding inflammatory soft tissue stranding. 2. Status post cholecystectomy with dilatation of the common bile duct measuring up to 1.1 cm and mild intrahepatic bile duct dilatation, without choledocholithiasis. 3. Hepatic steatosis. Electronically signed by: Waddell Calk MD 05/12/2024 05:23 AM EST RP Workstation: [id number]   MR 3D Recon At Scanner Result Date: 05/12/2024 EXAM: MRCP WITH AND WITHOUT IV CONTRAST 05/11/2024 02:51:57 PM TECHNIQUE: Multisequence, multiplanar magnetic resonance images of the abdomen with and without intravenous contrast. MRCP sequences were performed. 7.5 mL (gadobutrol  (GADAVIST ) 1 MMOL/ML injection 7.5 mL GADOBUTROL  1 MMOL/ML IV SOLN) was administered. COMPARISON: 05/09/2024 CLINICAL HISTORY: elevated LFTs abdominal pain FINDINGS: LIVER: Hepatic steatosis. GALLBLADDER AND BILIARY SYSTEM: Status post cholecystectomy. Dilatation of the common bile duct measures up to 1.1 cm with mild intrahepatic bile duct dilatation. No choledocholithiasis identified. SPLEEN: Unremarkable. PANCREAS/PANCREATIC DUCT: Interval development: There is mild enlargement and edema within the head of the pancreas with focal hyperechogenicity enhancement and mild surrounding inflammatory soft tissue stranding. Within the head of the pancreas, there are several mildly T2 hyperintense foci which measure up to 7 mm, possibly representing early pseudocysts in the setting of acute pancreatitis. There are a few scattered areas of side branch duct ectasia. Soft tissue stranding and fluid are identified along the pancreaticoduodenal groove. ADRENAL GLANDS: Unremarkable. KIDNEYS: Unremarkable. LYMPH NODES: No enlarged abdominal lymph nodes. VASCULATURE: Unremarkable. PERITONEUM: No ascites. ABDOMINAL WALL: No hernia. No mass. BOWEL: Grossly unremarkable. No bowel obstruction. BONES: No  acute abnormality or worrisome osseous lesion. SOFT TISSUES: Unremarkable. MISCELLANEOUS: Unremarkable. IMPRESSION: 1. Acute pancreatitis with mild enlargement and edema of the pancreatic head, possible early pseudocysts, and mild surrounding inflammatory soft tissue stranding. 2. Status post cholecystectomy with dilatation of the common bile duct measuring up to 1.1 cm and mild intrahepatic bile duct dilatation, without choledocholithiasis. 3. Hepatic steatosis. Electronically signed by: Waddell Calk MD 05/12/2024 05:23 AM EST RP Workstation: [id number]     Assessment   PORSHE FLEAGLE is a 68 y.o. year old female  with a history of afib, psoriatic arthritis, constipation, GERD, duodenal adenoma, HTN, pancreatitis initially diagnosed in 2021 after starting methotrexate, hypothyroidism, DM, colonic adenomas, concern for pancreatic head mass s/p evaluation at [facility] and [facility], prior history of pancreatitis with last episode in Aug 2024 felt possibly secondary to Ozempic, now with recurrent acute pancreatitis, elevated LFTs, and GI consulted for further management.    Acute pancreatitis: had  been doing well since Aug 2024 and had acute onset of symptoms 12/24, presenting to the ED several times with lipase initially mildly elevated and mildly elevated AST with symptoms improved s/p pain control, now with normal lipase but imaging consistent with acute pancreatitis on MRCP and new dilated CBD measuring up to 1.1cm without choledocholithiasis. Notably, she had bumps in transaminases and alk phos yesterday and improvement today. 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 Unable to rule out Mounjaro contributing, although she has been on this since Feb 2025 but did just have increase in dose about a month ago. Intentional weight loss noted on Mounjaro of around 35 lbs. Presentation seems more consistent with microlithiasis. As she is is clinically improving, will start clear liquids and continue supportive care. Needs  close outpatient follow-up with primary GI in [facility] for more dedicated imaging. Will also update lipid panel. Prior IgG4 normal.       Plan / Recommendations    Clear liquids Continue supportive measures Recommend outpatient follow-up with [facility] and consider EUS/ERCP in light of possible microlithiasis Would weigh risks and benefits of GLP-1 in this case; suspect more biliary component in this scenario Continue PPI Recommend advancing to soft, low fat diet as tolerated Follow HFP Outpatient follow-up with primary GI for other chronic issues (GERD, hx of adenomas and large polyp, hx of duodenal adenoma, hepatic steatosis, etc) Would recommend smoking cessation      05/12/2024, 10:00 AM  Therisa Stephanie Stager, PhD, ANP-BC [facility]      "

## 2024-05-12 NOTE — Progress Notes (Signed)
 Patient presented and initially placed in Observation, was upgraded to Inpatient on 12/29 for intractable abdominal pain and MRCP revealing acute pancreatitis with mildly dilated CBD.  Inpatient Care Manager (ICM) conducted chart review and contacted patient by phone to complete brief admission assessment.    Patient confirmed she is completely independent an lives at home with her spouse, 69 y/o Son, and her Sister. Patient continues to work part-time at [employer].  No discharge needs identified at this time. However, IPCM team will continue to follow along and monitor patient advancement through interdisciplinary progression rounds. If new patient transition needs arise, please enter a ICM consult to prompt IPCM team to follow up.     05/12/24 1425  TOC Brief Assessment  Insurance and Status Reviewed  Patient has primary care physician Yes  Home environment has been reviewed Home with Spouse & Famiily  Prior level of function: Independent  Prior/Current Home Services No current home services  Social Drivers of Health Review SDOH reviewed no interventions necessary  Readmission risk has been reviewed Yes  Transition of care needs no transition of care needs at this time

## 2024-05-12 NOTE — Inpatient Diabetes Management (Signed)
 Inpatient Diabetes Program Recommendations  AACE/ADA: New Consensus Statement on Inpatient Glycemic Control   Target Ranges:  Prepandial:   less than 140 mg/dL      Peak postprandial:   less than 180 mg/dL (1-2 hours)      Critically ill patients:  140 - 180 mg/dL    Latest Reference Range & Units 05/09/24 17:44 05/11/24 12:42  Glucose 70 - 99 mg/dL 93 86  BUN 8 - 23 mg/dL 24 (H) 22  Creatinine 9.55 - 1.00 mg/dL 8.84 (H) 8.85 (H)  Calcium 8.9 - 10.3 mg/dL 89.6 89.4 (H)  Anion gap 5 - 15  17 (H) 21 (H)  Alkaline Phosphatase 38 - 126 U/L 124 221 (H)  Albumin  3.5 - 5.0 g/dL 4.8 4.6  Lipase 11 - 51 U/L 119 (H) 32  AST 15 - 41 U/L 27 72 (H)  ALT 0 - 44 U/L 53 (H) 246 (H)   Review of Glycemic Control  Diabetes history: DM2 Outpatient Diabetes medications: Jardiance 10 mg daily, Mounjaro 12.5 mg Qweek Current orders for Inpatient glycemic control: None; D5NS@100  ml/hr  Inpatient Diabetes Program Recommendations:    Insulin: Please consider ordering CBGs Q4H and Novolog 0-6 units Q4H.   IV Fluids: Re-evaluate dextrose  in IV fluids when appropriate.  Outpatient DM: Given Lipase 119 on 05/09/24, may want to consider having patient stop Mounjaro for now (noted patient has listed allergy to Ozempic due to pancreatitis) and have patient follow up with Endocrinology to discuss if appropriate to resume.  Thanks, Earnie Gainer, RN, MSN, CDCES Diabetes Coordinator Inpatient Diabetes Program [phone] (Team Pager from 8am to 5pm)

## 2024-05-12 NOTE — Progress Notes (Signed)
 " PROGRESS NOTE    Stephanie Ewing  FMW:[id number] DOB: November 01, 1955 DOA: 05/11/2024 PCP: Leavy Waddell NOVAK, FNP   Brief Narrative:  Stephanie Ewing is a 68 y.o. female with medical history significant for hypertension, pancreatitis,  cholecystectomy, atrial fibrillation, hypothyroidism presented to the hospital with nausea, vomiting and upper abdominal pain that started 4 days ago.  Patient presented to the ER twice in 2 days.  On 12/26 lipase was mildly elevated 119, CT abdomen and pelvis with contrast was without acute abnormalities.  Patient was sent home, returns with persistent symptoms.  Vital signs are stable.  Elevated white count 16.3.  Hemoconcentrated.  Liver enzymes are elevated with AST 72, ALT 246, ALP 221, total bili 1.2.  MRCP shows acute pancreatitis with mildly dilated CBD with no definite choledocholithiasis.  Patient admitted for further management  Assessment & Plan:   Principal Problem:   Intractable abdominal pain   Intractable abdominal pain and vomiting-of 4 days duration.   Acute pancreatitis Elevated liver enzymes, mildly dilated CBD  - Received IV fluid bolus in the ER, start LR at 150 cc/h. -Patient is not tolerating clear liquid diet.  Will keep n.p.o. - IV Zofran , IV Dilaudid  as needed - Liver enzymes elevated AST 72, ALT 246, ALP 221, total bili 1.2.  Will obtain follow-up liver enzymes today. - GI consulted - History of cholecystectomy     Prediabetes-recent A1c 04/17/24 -6 - CBG q8hr - Hold Mounjaro, Jardiance.  May have to discontinue Jardiance due to recurrent pancreatitis   Hypertension-elevated - Resume spironolactone  50 mg daily - As needed labetalol 10mg  for systolic greater than 170   Paroxysmal atrial fibrillation - follows with Dr. Alvan, per notes anticoagulation was stopped due to isolated events with no clear recurrence.   - On PRN diltiazem , has very rarely ever used it   Hypothyroidism - Resume Synthroid      DVT  prophylaxis: Lovenox  Code Status: Full code Family Communication: None at bedside Disposition Plan: ~ 2 days Consults called: GI Disposition Plan: Status is: Patient the patient will require care spanning > 2 midnights and should be moved to inpatient because: Acute pancreatitis, needs continued symptom management, IV fluids, GI evaluation and potentially procedure  Subjective:  Patient seen and examined at the bedside.  She reports of ongoing abdominal discomfort and nausea requiring frequent Zofran  and IV Dilaudid .  She tried some clear liquid but does not want to take them anymore.  Vital signs are stable.  Afebrile  objective: Vitals:   05/12/24 0521 05/12/24 0523 05/12/24 0628 05/12/24 0720  BP: (!) 166/76   124/83  Pulse: 85 79 82 79  Resp:  16  18  Temp:  98.6 F (37 C)  98.5 F (36.9 C)  TempSrc:  Oral  Oral  SpO2: 94% 95% 93% 95%  Weight:      Height:        Intake/Output Summary (Last 24 hours) at 05/12/2024 0928 Last data filed at 05/12/2024 9148 Gross per 24 hour  Intake 1591.67 ml  Output --  Net 1591.67 ml   Filed Weights   05/11/24 1215  Weight: 72.1 kg    Examination:  General: Alert, oriented not in any acute distress Chest: Clear CVS: S1, S2, no murmur, regular rhythm Abdomen: Soft, nontender(took Dilaudid ) Extremities: No edema   Data Reviewed: I have personally reviewed following labs and imaging studies  CBC: Recent Labs  Lab 05/09/24 1744 05/11/24 1242 05/12/24 0853  WBC 13.1* 15.3* 10.7*  NEUTROABS  --   --  8.0*  HGB 16.9* 17.0* 14.6  HCT 52.1* 52.7* 44.2  MCV 88.3 89.2 88.0  PLT 291 253 234   Basic Metabolic Panel: Recent Labs  Lab 05/09/24 1744 05/11/24 1242 05/12/24 0853  NA 137 137 135  K 4.3 4.7 4.1  CL 98 97* 101  CO2 22 19* 20*  GLUCOSE 93 86 139*  BUN 24* 22 18  CREATININE 1.15* 1.14* 0.91  CALCIUM 10.3 10.5* 9.5   GFR: Estimated Creatinine Clearance: 56.7 mL/min (by C-G formula based on SCr of 0.91  mg/dL). Liver Function Tests: Recent Labs  Lab 05/09/24 1744 05/11/24 1242 05/12/24 0853  AST 27 72* 39  ALT 53* 246* 148*  ALKPHOS 124 221* 166*  BILITOT 0.6 1.2 0.6  PROT 7.9 8.1 6.6  ALBUMIN  4.8 4.6 3.9   Recent Labs  Lab 05/09/24 1744 05/11/24 1242  LIPASE 119* 32   No results for input(s): AMMONIA in the last 168 hours. Coagulation Profile: No results for input(s): INR, PROTIME in the last 168 hours. Cardiac Enzymes: No results for input(s): CKTOTAL, CKMB, CKMBINDEX, TROPONINI in the last 168 hours. BNP (last 3 results) No results for input(s): PROBNP in the last 8760 hours. HbA1C: No results for input(s): HGBA1C in the last 72 hours. CBG: Recent Labs  Lab 05/11/24 2054  GLUCAP 69*   Lipid Profile: No results for input(s): CHOL, HDL, LDLCALC, TRIG, CHOLHDL, LDLDIRECT in the last 72 hours. Thyroid Function Tests: No results for input(s): TSH, T4TOTAL, FREET4, T3FREE, THYROIDAB in the last 72 hours. Anemia Panel: No results for input(s): VITAMINB12, FOLATE, FERRITIN, TIBC, IRON, RETICCTPCT in the last 72 hours. Sepsis Labs: No results for input(s): PROCALCITON, LATICACIDVEN in the last 168 hours.  No results found for this or any previous visit (from the past 240 hours).       Radiology Studies: MR ABDOMEN MRCP W WO CONTAST Result Date: 05/12/2024 EXAM: MRCP WITH AND WITHOUT IV CONTRAST 05/11/2024 02:51:57 PM TECHNIQUE: Multisequence, multiplanar magnetic resonance images of the abdomen with and without intravenous contrast. MRCP sequences were performed. 7.5 mL (gadobutrol  (GADAVIST ) 1 MMOL/ML injection 7.5 mL GADOBUTROL  1 MMOL/ML IV SOLN) was administered. COMPARISON: 05/09/2024 CLINICAL HISTORY: elevated LFTs abdominal pain FINDINGS: LIVER: Hepatic steatosis. GALLBLADDER AND BILIARY SYSTEM: Status post cholecystectomy. Dilatation of the common bile duct measures up to 1.1 cm with mild intrahepatic bile  duct dilatation. No choledocholithiasis identified. SPLEEN: Unremarkable. PANCREAS/PANCREATIC DUCT: Interval development: There is mild enlargement and edema within the head of the pancreas with focal hyperechogenicity enhancement and mild surrounding inflammatory soft tissue stranding. Within the head of the pancreas, there are several mildly T2 hyperintense foci which measure up to 7 mm, possibly representing early pseudocysts in the setting of acute pancreatitis. There are a few scattered areas of side branch duct ectasia. Soft tissue stranding and fluid are identified along the pancreaticoduodenal groove. ADRENAL GLANDS: Unremarkable. KIDNEYS: Unremarkable. LYMPH NODES: No enlarged abdominal lymph nodes. VASCULATURE: Unremarkable. PERITONEUM: No ascites. ABDOMINAL WALL: No hernia. No mass. BOWEL: Grossly unremarkable. No bowel obstruction. BONES: No acute abnormality or worrisome osseous lesion. SOFT TISSUES: Unremarkable. MISCELLANEOUS: Unremarkable. IMPRESSION: 1. Acute pancreatitis with mild enlargement and edema of the pancreatic head, possible early pseudocysts, and mild surrounding inflammatory soft tissue stranding. 2. Status post cholecystectomy with dilatation of the common bile duct measuring up to 1.1 cm and mild intrahepatic bile duct dilatation, without choledocholithiasis. 3. Hepatic steatosis. Electronically signed by: Waddell Calk MD 05/12/2024 05:23 AM EST RP Workstation: [id number]   MR 3D Recon At Scanner  Result Date: 05/12/2024 EXAM: MRCP WITH AND WITHOUT IV CONTRAST 05/11/2024 02:51:57 PM TECHNIQUE: Multisequence, multiplanar magnetic resonance images of the abdomen with and without intravenous contrast. MRCP sequences were performed. 7.5 mL (gadobutrol  (GADAVIST ) 1 MMOL/ML injection 7.5 mL GADOBUTROL  1 MMOL/ML IV SOLN) was administered. COMPARISON: 05/09/2024 CLINICAL HISTORY: elevated LFTs abdominal pain FINDINGS: LIVER: Hepatic steatosis. GALLBLADDER AND BILIARY SYSTEM: Status post  cholecystectomy. Dilatation of the common bile duct measures up to 1.1 cm with mild intrahepatic bile duct dilatation. No choledocholithiasis identified. SPLEEN: Unremarkable. PANCREAS/PANCREATIC DUCT: Interval development: There is mild enlargement and edema within the head of the pancreas with focal hyperechogenicity enhancement and mild surrounding inflammatory soft tissue stranding. Within the head of the pancreas, there are several mildly T2 hyperintense foci which measure up to 7 mm, possibly representing early pseudocysts in the setting of acute pancreatitis. There are a few scattered areas of side branch duct ectasia. Soft tissue stranding and fluid are identified along the pancreaticoduodenal groove. ADRENAL GLANDS: Unremarkable. KIDNEYS: Unremarkable. LYMPH NODES: No enlarged abdominal lymph nodes. VASCULATURE: Unremarkable. PERITONEUM: No ascites. ABDOMINAL WALL: No hernia. No mass. BOWEL: Grossly unremarkable. No bowel obstruction. BONES: No acute abnormality or worrisome osseous lesion. SOFT TISSUES: Unremarkable. MISCELLANEOUS: Unremarkable. IMPRESSION: 1. Acute pancreatitis with mild enlargement and edema of the pancreatic head, possible early pseudocysts, and mild surrounding inflammatory soft tissue stranding. 2. Status post cholecystectomy with dilatation of the common bile duct measuring up to 1.1 cm and mild intrahepatic bile duct dilatation, without choledocholithiasis. 3. Hepatic steatosis. Electronically signed by: Waddell Calk MD 05/12/2024 05:23 AM EST RP Workstation: [id number]        Scheduled Meds:  pantoprazole  (PROTONIX ) IV  40 mg Intravenous Q24H   Continuous Infusions:  lactated ringers  150 mL/hr at 05/12/24 9143          Derryl Duval, MD [facility] 05/12/2024, 9:28 AM   "

## 2024-05-12 NOTE — ED Notes (Addendum)
 Pt rested well over night. She required PRN dilaudid  and percocet. No acute events overnight. She was updated that we are still awaiting bed availability. Kellogg RN

## 2024-05-12 NOTE — Progress Notes (Signed)
 Myself (nurse) and tech makayla at bedside with new admission. Patient is sitting in chair with personal belongings in reach. IV fluids are running at 150 ml/hr. Lunch tray is within reach. No questions or concerns were presented to us  at this time.

## 2024-05-12 NOTE — ED Notes (Signed)
 Report attempt given to nurse.

## 2024-05-13 DIAGNOSIS — K838 Other specified diseases of biliary tract: Secondary | ICD-10-CM | POA: Diagnosis not present

## 2024-05-13 DIAGNOSIS — R748 Abnormal levels of other serum enzymes: Secondary | ICD-10-CM | POA: Diagnosis not present

## 2024-05-13 DIAGNOSIS — K859 Acute pancreatitis without necrosis or infection, unspecified: Secondary | ICD-10-CM | POA: Diagnosis not present

## 2024-05-13 DIAGNOSIS — R7401 Elevation of levels of liver transaminase levels: Secondary | ICD-10-CM | POA: Diagnosis not present

## 2024-05-13 DIAGNOSIS — R109 Unspecified abdominal pain: Secondary | ICD-10-CM | POA: Diagnosis not present

## 2024-05-13 LAB — COMPREHENSIVE METABOLIC PANEL WITH GFR
ALT: 100 U/L — ABNORMAL HIGH (ref 0–44)
AST: 28 U/L (ref 15–41)
Albumin: 3.5 g/dL (ref 3.5–5.0)
Alkaline Phosphatase: 147 U/L — ABNORMAL HIGH (ref 38–126)
Anion gap: 7 (ref 5–15)
BUN: 15 mg/dL (ref 8–23)
CO2: 30 mmol/L (ref 22–32)
Calcium: 9.7 mg/dL (ref 8.9–10.3)
Chloride: 102 mmol/L (ref 98–111)
Creatinine, Ser: 0.88 mg/dL (ref 0.44–1.00)
GFR, Estimated: >60 mL/min
Glucose, Bld: 76 mg/dL (ref 70–99)
Potassium: 4.3 mmol/L (ref 3.5–5.1)
Sodium: 139 mmol/L (ref 135–145)
Total Bilirubin: 0.6 mg/dL (ref 0.0–1.2)
Total Protein: 6.1 g/dL — ABNORMAL LOW (ref 6.5–8.1)

## 2024-05-13 LAB — CBC
HCT: 43.3 % (ref 36.0–46.0)
Hemoglobin: 13.7 g/dL (ref 12.0–15.0)
MCH: 28.4 pg (ref 26.0–34.0)
MCHC: 31.6 g/dL (ref 30.0–36.0)
MCV: 89.8 fL (ref 80.0–100.0)
Platelets: 231 K/uL (ref 150–400)
RBC: 4.82 MIL/uL (ref 3.87–5.11)
RDW: 13.3 % (ref 11.5–15.5)
WBC: 7.4 K/uL (ref 4.0–10.5)
nRBC: 0 % (ref 0.0–0.2)

## 2024-05-13 LAB — GLUCOSE, CAPILLARY
Glucose-Capillary: 75 mg/dL (ref 70–99)
Glucose-Capillary: 79 mg/dL (ref 70–99)

## 2024-05-13 LAB — HIV ANTIBODY (ROUTINE TESTING W REFLEX): HIV Screen 4th Generation wRfx: NONREACTIVE

## 2024-05-13 MED ORDER — DIPHENHYDRAMINE HCL 50 MG/ML IJ SOLN
12.5000 mg | Freq: Once | INTRAMUSCULAR | Status: AC
  Administered 2024-05-13: 12.5 mg via INTRAVENOUS
  Filled 2024-05-13: qty 1

## 2024-05-13 MED ORDER — HYDROMORPHONE HCL 1 MG/ML IJ SOLN
0.5000 mg | INTRAMUSCULAR | Status: DC | PRN
  Administered 2024-05-13: 0.5 mg via INTRAVENOUS
  Filled 2024-05-13 (×2): qty 0.5

## 2024-05-13 MED ORDER — OXYCODONE HCL 5 MG PO TABS
5.0000 mg | ORAL_TABLET | Freq: Four times a day (QID) | ORAL | Status: DC | PRN
  Administered 2024-05-13: 5 mg via ORAL
  Filled 2024-05-13: qty 1

## 2024-05-13 NOTE — Progress Notes (Signed)
 Mobility Specialist Progress Note:    05/13/24 1310  Mobility  Activity Ambulated with assistance  Level of Assistance Independent  Assistive Device None  Distance Ambulated (ft) 280 ft  Range of Motion/Exercises Active;All extremities  Activity Response Tolerated well  Mobility Referral Yes  Mobility visit 1 Mobility  Mobility Specialist Start Time (ACUTE ONLY) 1310  Mobility Specialist Stop Time (ACUTE ONLY) 1330  Mobility Specialist Time Calculation (min) (ACUTE ONLY) 20 min   Pt received supine, agreeable to mobility. Independently able to stand and ambulate with no AD. Tolerated well, asx throughout. Returned supine, all needs met.  Rome Echavarria Mobility Specialist Please contact via Special Educational Needs Teacher or  Rehab office at [phone]

## 2024-05-13 NOTE — Plan of Care (Signed)
" °  Problem: Pain Managment: Goal: General experience of comfort will improve and/or be controlled Outcome: Progressing   Problem: Education: Goal: Knowledge of Pancreatitis treatment and prevention will improve Outcome: Progressing   Problem: Health Behavior/Discharge Planning: Goal: Ability to formulate a plan to maintain an alcohol-free life will improve Outcome: Progressing   Problem: Nutritional: Goal: Ability to achieve adequate nutritional intake will improve Outcome: Progressing   Problem: Clinical Measurements: Goal: Complications related to the disease process, condition or treatment will be avoided or minimized Outcome: Progressing   "

## 2024-05-13 NOTE — Progress Notes (Signed)
 "  Gastroenterology Progress Note   Referring Provider: No ref. provider found Primary Care Physician:  Leavy Waddell NOVAK, FNP Primary Gastroenterologist:  Dr. Leigh ([facility])  Patient ID: CHYANN AMBRO; [id number]; 69/16/1957CIO; [id number]; 69/16/1957    Subjective   Feeling well today, patient examined as she was finishing lunch.  She states she is feeling much better overall, still has some mild pain but very tolerable and manageable.  Was able to eat solid food without having any pain and this is the first meal that she has had since Christmas.  She states she is ready to go home.  Objective   Vital signs in last 24 hours Temp:  [97.6 F (36.4 C)-98.4 F (36.9 C)] 97.6 F (36.4 C) (12/30 0413) Pulse Rate:  [72-78] 73 (12/30 0746) Resp:  [16-20] 16 (12/30 0746) BP: (124-181)/(63-83) 124/63 (12/30 0746) SpO2:  [92 %-98 %] 95 % (12/30 0746) Last BM Date : 05/11/24  Physical Exam General:   Alert and oriented, pleasant Head:  Normocephalic and atraumatic. Eyes:  No icterus, sclera clear. Conjuctiva pink.  Mouth:  Without lesions, mucosa pink and moist.  Neck:  Supple, without thyromegaly or masses.  Abdomen:  Bowel sounds present, soft, non-tender, non-distended. No HSM or hernias noted. No rebound or guarding. No masses appreciated  Msk:  Symmetrical without gross deformities. Normal posture. Neurologic:  Alert and  oriented x4;  grossly normal neurologically. Psych:  Alert and cooperative. Normal mood and affect.  Intake/Output from previous day: 12/29 0701 - 12/30 0700 In: 1815.1 [P.O.:540; I.V.:1275.1] Out: -  Intake/Output this shift: No intake/output data recorded.  Lab Results  Recent Labs    05/11/24 1242 05/12/24 0853 05/13/24 0421  WBC 15.3* 10.7* 7.4  HGB 17.0* 14.6 13.7  HCT 52.7* 44.2 43.3  PLT 253 234 231   BMET Recent Labs    05/11/24 1242 05/12/24 0853 05/13/24 0421  NA 137 135 139  K 4.7 4.1 4.3  CL 97* 101 102  CO2 19* 20* 30  GLUCOSE 86 139* 76  BUN 22  18 15   CREATININE 1.14* 0.91 0.88  CALCIUM 10.5* 9.5 9.7   LFT Recent Labs    05/11/24 1242 05/12/24 0853 05/13/24 0421  PROT 8.1 6.6 6.1*  ALBUMIN  4.6 3.9 3.5  AST 72* 39 28  ALT 246* 148* 100*  ALKPHOS 221* 166* 147*  BILITOT 1.2 0.6 0.6   PT/INR No results for input(s): LABPROT, INR in the last 72 hours. Hepatitis Panel Recent Labs    05/11/24 1242  HEPBSAG NON REACTIVE  HCVAB NON REACTIVE  HEPAIGM NON REACTIVE  HEPBIGM NON REACTIVE    Studies/Results MR ABDOMEN MRCP W WO CONTAST Result Date: 05/12/2024 EXAM: MRCP WITH AND WITHOUT IV CONTRAST 05/11/2024 02:51:57 PM TECHNIQUE: Multisequence, multiplanar magnetic resonance images of the abdomen with and without intravenous contrast. MRCP sequences were performed. 7.5 mL (gadobutrol  (GADAVIST ) 1 MMOL/ML injection 7.5 mL GADOBUTROL  1 MMOL/ML IV SOLN) was administered. COMPARISON: 05/09/2024 CLINICAL HISTORY: elevated LFTs abdominal pain FINDINGS: LIVER: Hepatic steatosis. GALLBLADDER AND BILIARY SYSTEM: Status post cholecystectomy. Dilatation of the common bile duct measures up to 1.1 cm with mild intrahepatic bile duct dilatation. No choledocholithiasis identified. SPLEEN: Unremarkable. PANCREAS/PANCREATIC DUCT: Interval development: There is mild enlargement and edema within the head of the pancreas with focal hyperechogenicity enhancement and mild surrounding inflammatory soft tissue stranding. Within the head of the pancreas, there are several mildly T2 hyperintense foci which measure up to 7 mm, possibly representing early pseudocysts in the setting of acute pancreatitis.  There are a few scattered areas of side branch duct ectasia. Soft tissue stranding and fluid are identified along the pancreaticoduodenal groove. ADRENAL GLANDS: Unremarkable. KIDNEYS: Unremarkable. LYMPH NODES: No enlarged abdominal lymph nodes. VASCULATURE: Unremarkable. PERITONEUM: No ascites. ABDOMINAL WALL: No hernia. No mass. BOWEL: Grossly  unremarkable. No bowel obstruction. BONES: No acute abnormality or worrisome osseous lesion. SOFT TISSUES: Unremarkable. MISCELLANEOUS: Unremarkable. IMPRESSION: 1. Acute pancreatitis with mild enlargement and edema of the pancreatic head, possible early pseudocysts, and mild surrounding inflammatory soft tissue stranding. 2. Status post cholecystectomy with dilatation of the common bile duct measuring up to 1.1 cm and mild intrahepatic bile duct dilatation, without choledocholithiasis. 3. Hepatic steatosis. Electronically signed by: Waddell Calk MD 05/12/2024 05:23 AM EST RP Workstation: [id number]   MR 3D Recon At Scanner Result Date: 05/12/2024 EXAM: MRCP WITH AND WITHOUT IV CONTRAST 05/11/2024 02:51:57 PM TECHNIQUE: Multisequence, multiplanar magnetic resonance images of the abdomen with and without intravenous contrast. MRCP sequences were performed. 7.5 mL (gadobutrol  (GADAVIST ) 1 MMOL/ML injection 7.5 mL GADOBUTROL  1 MMOL/ML IV SOLN) was administered. COMPARISON: 05/09/2024 CLINICAL HISTORY: elevated LFTs abdominal pain FINDINGS: LIVER: Hepatic steatosis. GALLBLADDER AND BILIARY SYSTEM: Status post cholecystectomy. Dilatation of the common bile duct measures up to 1.1 cm with mild intrahepatic bile duct dilatation. No choledocholithiasis identified. SPLEEN: Unremarkable. PANCREAS/PANCREATIC DUCT: Interval development: There is mild enlargement and edema within the head of the pancreas with focal hyperechogenicity enhancement and mild surrounding inflammatory soft tissue stranding. Within the head of the pancreas, there are several mildly T2 hyperintense foci which measure up to 7 mm, possibly representing early pseudocysts in the setting of acute pancreatitis. There are a few scattered areas of side branch duct ectasia. Soft tissue stranding and fluid are identified along the pancreaticoduodenal groove. ADRENAL GLANDS: Unremarkable. KIDNEYS: Unremarkable. LYMPH NODES: No enlarged abdominal lymph nodes.  VASCULATURE: Unremarkable. PERITONEUM: No ascites. ABDOMINAL WALL: No hernia. No mass. BOWEL: Grossly unremarkable. No bowel obstruction. BONES: No acute abnormality or worrisome osseous lesion. SOFT TISSUES: Unremarkable. MISCELLANEOUS: Unremarkable. IMPRESSION: 1. Acute pancreatitis with mild enlargement and edema of the pancreatic head, possible early pseudocysts, and mild surrounding inflammatory soft tissue stranding. 2. Status post cholecystectomy with dilatation of the common bile duct measuring up to 1.1 cm and mild intrahepatic bile duct dilatation, without choledocholithiasis. 3. Hepatic steatosis. Electronically signed by: Waddell Calk MD 05/12/2024 05:23 AM EST RP Workstation: [id number]   CT ABDOMEN PELVIS W CONTRAST Result Date: 05/09/2024 EXAM: CT ABDOMEN AND PELVIS WITH CONTRAST 05/09/2024 07:35:34 PM TECHNIQUE: CT of the abdomen and pelvis was performed with the administration of 100 mL of iohexol  (OMNIPAQUE ) 300 MG/ML solution. Multiplanar reformatted images are provided for review. Automated exposure control, iterative reconstruction, and/or weight-based adjustment of the mA/kV was utilized to reduce the radiation dose to as low as reasonably achievable. COMPARISON: 01/04/2023 CLINICAL HISTORY: Abdominal pain, acute, nonlocalized; Pancreatitis, acute, severe. FINDINGS: LOWER CHEST: No acute abnormality. LIVER: The liver is unremarkable. GALLBLADDER AND BILE DUCTS: Status post cholecystectomy. Mild dilation of the biliary tree likely reflects post cholecystectomy change. SPLEEN: No acute abnormality. PANCREAS: There is asymmetric fatty infiltration of the head and body of the pancreas, similar to prior examination, possibly the sequela of prior inflammation. There are, however, no superimposed acute pancreatic inflammatory changes identified. The pancreatic duct is not dilated. No loculated pancreatic fluid collections. ADRENAL GLANDS: No acute abnormality. KIDNEYS, URETERS AND BLADDER: No  stones in the kidneys or ureters. No hydronephrosis. No perinephric or periureteral stranding. Urinary bladder is unremarkable. GI AND BOWEL: Stomach  demonstrates no acute abnormality. There is no bowel obstruction. PERITONEUM AND RETROPERITONEUM: No ascites. No free air. VASCULATURE: Mild aortoiliac atherosclerotic calcification. No aortic aneurysm. LYMPH NODES: No lymphadenopathy. REPRODUCTIVE ORGANS: The uterus is absent. No adnexal masses are seen. BONES AND SOFT TISSUES: Osseous structures are age appropriate. No acute bone abnormality. No lytic or blastic bone lesion. No focal soft tissue abnormality. IMPRESSION: 1. No acute abdominopelvic abnormality, with no acute pancreatitis. 2. Asymmetric fatty infiltration of the pancreatic head and body, similar to prior, possibly sequela of prior inflammation. 3. Status post cholecystectomy with mild biliary tree dilation, likely postcholecystectomy change. 4. Status post hysterectomy. 5. Raf score: Aortic atherosclerosis (icd10-i70.0). Electronically signed by: Dorethia Molt MD 05/09/2024 09:35 PM EST RP Workstation: [id number]   ECHOCARDIOGRAM COMPLETE Result Date: 04/20/2024    ECHOCARDIOGRAM REPORT   Patient Name:   MILO SOLANA Date of Exam: 04/16/2024 Medical Rec #:  [id number]        Height:       67.0 in Accession #:    [id number]       Weight:       169.4 lb Date of Birth:  [birth year]       BSA:          1.884 m Patient Age:    68 years         BP:           116/70 mmHg Patient Gender: F                HR:           74 bpm. Exam Location:  [facility] Procedure: 2D Echo, Cardiac Doppler, Color Doppler and Strain Analysis (Both            Spectral and Color Flow Doppler were utilized during procedure). Indications:    I35.1 Nonrheumatic aortic (valve) insufficiency  History:        Patient has prior history of Echocardiogram examinations, most                 recent 01/09/2022. Aortic Valve Disease, Arrythmias:PVC and                 Atrial Fibrillation; Risk  Factors:Hypertension and Current                 Smoker.  Sonographer:    Bascom Burows RCS, RVS Referring Phys: [id number] DORN FALCON BRANCH IMPRESSIONS  1. Left ventricular ejection fraction, by estimation, is 60 to 65%. The left ventricle has normal function. The left ventricle has no regional wall motion abnormalities. Left ventricular diastolic parameters are consistent with Grade I diastolic dysfunction (impaired relaxation). The average left ventricular global longitudinal strain is -18.0 %. The global longitudinal strain is normal.  2. Right ventricular systolic function is normal. The right ventricular size is normal. Tricuspid regurgitation signal is inadequate for assessing PA pressure.  3. The mitral valve is normal in structure. Trivial mitral valve regurgitation. No evidence of mitral stenosis.  4. The aortic valve was not well visualized. There is mild calcification of the aortic valve. There is mild thickening of the aortic valve. Aortic valve regurgitation is mild to moderate. No aortic stenosis is present.  5. The inferior vena cava is normal in size with greater than 50% respiratory variability, suggesting right atrial pressure of 3 mmHg. Comparison(s): A prior study was performed on 01/09/2022. EF 60-65%. Mild MR. Mild AI (PHT 523m/sec). FINDINGS  Left Ventricle: Left ventricular ejection fraction, by estimation, is  60 to 65%. The left ventricle has normal function. The left ventricle has no regional wall motion abnormalities. The average left ventricular global longitudinal strain is -18.0 %. Strain was performed and the global longitudinal strain is normal. The left ventricular internal cavity size was normal in size. There is no left ventricular hypertrophy. Left ventricular diastolic parameters are consistent with Grade I diastolic dysfunction (impaired relaxation). Normal left ventricular filling pressure. Right Ventricle: The right ventricular size is normal. Right vetricular wall thickness was  not well visualized. Right ventricular systolic function is normal. Tricuspid regurgitation signal is inadequate for assessing PA pressure. Left Atrium: Left atrial size was normal in size. Right Atrium: Right atrial size was normal in size. Pericardium: There is no evidence of pericardial effusion. Mitral Valve: The mitral valve is normal in structure. There is mild thickening of the mitral valve leaflet(s). There is mild calcification of the mitral valve leaflet(s). Mild mitral annular calcification. Trivial mitral valve regurgitation. No evidence  of mitral valve stenosis. MV peak gradient, 6.5 mmHg. The mean mitral valve gradient is 3.0 mmHg. Tricuspid Valve: The tricuspid valve is normal in structure. Tricuspid valve regurgitation is trivial. No evidence of tricuspid stenosis. Aortic Valve: The aortic valve was not well visualized. There is mild calcification of the aortic valve. There is mild thickening of the aortic valve. There is mild aortic valve annular calcification. Aortic valve regurgitation is mild to moderate. Aortic regurgitation PHT measures 576 msec. No aortic stenosis is present. Aortic valve mean gradient measures 7.7 mmHg. Aortic valve peak gradient measures 14.7 mmHg. Aortic valve area, by VTI measures 1.83 cm. Pulmonic Valve: The pulmonic valve was not well visualized. Pulmonic valve regurgitation is not visualized. No evidence of pulmonic stenosis. Aorta: The aortic root and ascending aorta are structurally normal, with no evidence of dilitation. Venous: The inferior vena cava is normal in size with greater than 50% respiratory variability, suggesting right atrial pressure of 3 mmHg. IAS/Shunts: No atrial level shunt detected by color flow Doppler. Additional Comments: 3D was performed not requiring image post processing on an independent workstation and was normal.  LEFT VENTRICLE PLAX 2D LVIDd:         4.00 cm     Diastology LVIDs:         2.60 cm     LV e' medial:    5.22 cm/s LV PW:          1.10 cm     LV E/e' medial:  18.5 LV IVS:        1.10 cm     LV e' lateral:   8.59 cm/s LVOT diam:     2.00 cm     LV E/e' lateral: 11.3 LV SV:         80 LV SV Index:   42          2D Longitudinal Strain LVOT Area:     3.14 cm    2D Strain GLS (A4C):   -17.7 %                            2D Strain GLS (A3C):   -17.0 %                            2D Strain GLS (A2C):   -19.3 % LV Volumes (MOD)           2D Strain GLS Avg:     -  18.0 % LV vol d, MOD A2C: 48.8 ml LV vol d, MOD A4C: 52.3 ml LV vol s, MOD A2C: 14.3 ml LV vol s, MOD A4C: 17.6 ml LV SV MOD A2C:     34.5 ml LV SV MOD A4C:     52.3 ml LV SV MOD BP:      35.0 ml RIGHT VENTRICLE             IVC RV Basal diam:  2.90 cm     IVC diam: 1.40 cm RV Mid diam:    2.30 cm RV S prime:     11.40 cm/s  PULMONARY VEINS TAPSE (M-mode): 2.2 cm      Diastolic Velocity: 47.10 cm/s                             S/D Velocity:       1.40                             Systolic Velocity:  65.10 cm/s LEFT ATRIUM             Index        RIGHT ATRIUM          Index LA diam:        3.30 cm 1.75 cm/m   RA Area:     8.97 cm LA Vol (A2C):   36.5 ml 19.37 ml/m  RA Volume:   14.70 ml 7.80 ml/m LA Vol (A4C):   32.4 ml 17.19 ml/m LA Biplane Vol: 35.7 ml 18.94 ml/m  AORTIC VALVE                     PULMONIC VALVE AV Area (Vmax):    1.94 cm      PV Vmax:       0.74 m/s AV Area (Vmean):   1.72 cm      PV Peak grad:  2.2 mmHg AV Area (VTI):     1.83 cm AV Vmax:           191.45 cm/s AV Vmean:          129.180 cm/s AV VTI:            0.437 m AV Peak Grad:      14.7 mmHg AV Mean Grad:      7.7 mmHg LVOT Vmax:         118.12 cm/s LVOT Vmean:        70.817 cm/s LVOT VTI:          0.254 m LVOT/AV VTI ratio: 0.58 AI PHT:            576 msec AR Vena Contracta: 0.50 cm  AORTA Ao Root diam: 2.70 cm Ao Asc diam:  3.40 cm MITRAL VALVE MV Area (PHT): 3.31 cm     SHUNTS MV Peak grad:  6.5 mmHg     Systemic VTI:  0.25 m MV Mean grad:  3.0 mmHg     Systemic Diam: 2.00 cm MV Vmax:       1.27 m/s MV  Vmean:      83.5 cm/s MV Decel Time: 229 msec MV E velocity: 96.80 cm/s MV A velocity: 119.00 cm/s MV E/A ratio:  0.81 Dorn Ross MD Electronically signed by Dorn Ross MD Signature Date/Time: 04/20/2024/4:21:37 PM    Final     Assessment  68 y.o.  female with a history of A-fib, psoriatic arthritis, constipation, GERD, duodenal adenoma, HTN, and pancreatitis initially diagnosed in 2021 after starting methotrexate, hypothyroidism, diabetes, colonic adenomas, and concern for pancreatic head mass s/p evaluation at [facility] in [place].  She had her last episode of pancreatitis in 2024 which was felt to be possibly secondary to Ozempic and is now with recurrent acute pancreatitis but also with elevated LFTs.  GI was consulted for further management.  Acute pancreatitis: Reportedly doing well since her last episode of pancreatitis in August 2024 but had acute onset of symptoms with pain on Christmas Eve.  She presented to the ED several times prior to this admission with mildly elevated lipase and then subsequently mildly elevated AST.  She has received IV fluids and pain control with improvement of symptoms.  She had normal lipase on admission.  Her CT scan on 12/26 during ED visit was consistent with fatty infiltration of the pancreatic head and body which was likely sequelae of prior inflammation but no active/acute inflammation.  Her MRI/MRCP 12/28 however did show acute pancreatitis with enlargement and edema of the pancreatic head with possible early pseudocyst and surrounding inflammatory soft tissue stranding as well as some dilation of the CBD measuring up to 1.1 cm with mild intrahepatic bile duct dilation without obvious choledocholithiasis.  As stated previously she has been on Mounjaro as well as Jardiance however she has been on Mounjaro since February 2025 and this had about a 35 pound intentional weight loss therefore would like to continue the medication and suspect that Mounjaro is the  culprit is less likely.  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  Since admission her AST has normalized and her ALT and alkaline phosphatase is improving.  We discussed the importance of a low-fat diet.  We discussed that given the microlithiasis she may need repeat EUS/ERCP and she will need to have a discussion about this with her primary GI as well as to follow-up on her other chronic GI issues including duodenal adenoma, colon polyps, hepatic steatosis, etc.  If she has recurrent bump in LFTs and/or alkaline phosphatase associated with another episode of pancreatitis, may need to consider medication for cholestasis such as ursodiol.  Will defer management to her primary GI.   Plan / Recommendations  Low-fat diet recommended on discharge Continue adequate hydration at home Again discussed risks and benefits of GLP-1, she wants to continue medication and I advised her to have this conversation again with her primary GI but low risk of continuing medication as of now given suspected microlithiasis as etiology. Continue PPI Advised her to have a conversation with primary GI regarding EUS/ERCP given possible microlithiasis and to follow-up on her other chronic GI issues. Smoking cessation.  Okay to discharge from GI standpoint with close outpatient follow-up with primary GI.   LOS: 1 day    05/13/2024, 11:15 AM   Charmaine Melia, MSN, FNP-BC, AGACNP-BC [facility]   "

## 2024-05-13 NOTE — Progress Notes (Signed)
 " PROGRESS NOTE    Stephanie Ewing  FMW:[id number] DOB: May 23, 1955 DOA: 05/11/2024 PCP: Leavy Waddell NOVAK, FNP   Brief Narrative:  Stephanie Ewing is a 68 y.o. female with medical history significant for hypertension, pancreatitis,  cholecystectomy, atrial fibrillation, hypothyroidism presented to the hospital with nausea, vomiting and upper abdominal pain that started 4 days ago.  Patient presented to the ER twice in 2 days.  On 12/26 lipase was mildly elevated 119, CT abdomen and pelvis with contrast was without acute abnormalities.  Patient was sent home, returns with persistent symptoms.  Vital signs are stable.  Elevated white count 16.3.  Hemoconcentrated.  Liver enzymes are elevated with AST 72, ALT 246, ALP 221, total bili 1.2.  MRCP shows acute pancreatitis with mildly dilated CBD with no definite choledocholithiasis.  Patient admitted for further management.  Improving with IV fluids/symptomatic management.  Liver enzymes are trending down.  Assessment & Plan:   Principal Problem:   Intractable abdominal pain Active Problems:   Epigastric abdominal pain   Elevated LFTs   Intractable abdominal pain and vomiting-of 4 days duration.   Acute pancreatitis Elevated liver enzymes, mildly dilated CBD History of cholecystectomy -Etiology unclear.  Patient is a smoker.  Takes Jardiance and Mounjaro. -Continue IV fluid. -IV Zofran , IV Dilaudid  as needed - Liver enzymes elevated AST 72, ALT 246, ALP 221, total bili 1.2.  On presentation has significantly trended down. - GI on board. -Discussed discontinuing Mounjaro down the road but patient is reluctant.     Prediabetes-recent A1c 04/17/24 -6 - CBG q8hr - Hold Mounjaro, Jardiance.   - Discussed alternatives due to recurrent pancreatitis  Hypertension-elevated -  spironolactone  50 mg daily - As needed labetalol 10mg  for systolic greater than 170   Paroxysmal atrial fibrillation - follows with Dr. Alvan, per notes  anticoagulation was stopped due to isolated events with no clear recurrence.   - On PRN diltiazem , has very rarely ever used it   Hypothyroidism - Resume Synthroid      DVT prophylaxis: Lovenox  Code Status: Full code Family Communication: None at bedside Disposition Plan: ~ 2 days Consults called: GI Disposition Plan: Status is: Patient the patient will require care spanning > 2 midnights and should be moved to inpatient because: Acute pancreatitis, ongoing symptoms Subjective:  Patient seen and examined at the bedside.  Feels better however has been using intermittent Dilaudid .  Took this morning.  Advancing diet   objective: Vitals:   05/12/24 2036 05/13/24 0016 05/13/24 0413 05/13/24 0746  BP: (!) 169/78 (!) 148/71 134/66 124/63  Pulse: 74 72 78 73  Resp: 20 18 17 16   Temp: 97.9 F (36.6 C) 97.7 F (36.5 C) 97.6 F (36.4 C)   TempSrc: Oral Oral Oral   SpO2: 93% 94% 92% 95%  Weight:      Height:        Intake/Output Summary (Last 24 hours) at 05/13/2024 0957 Last data filed at 05/13/2024 0600 Gross per 24 hour  Intake 1538.39 ml  Output --  Net 1538.39 ml   Filed Weights   05/11/24 1215  Weight: 72.1 kg    Examination:  General: Alert, oriented not in any acute distress Chest: Clear CVS: S1, S2, no murmur, regular rhythm Abdomen: Soft, nontender(took Dilaudid ) Extremities: No edema   Data Reviewed: I have personally reviewed following labs and imaging studies  CBC: Recent Labs  Lab 05/09/24 1744 05/11/24 1242 05/12/24 0853 05/13/24 0421  WBC 13.1* 15.3* 10.7* 7.4  NEUTROABS  --   --  8.0*  --   HGB 16.9* 17.0* 14.6 13.7  HCT 52.1* 52.7* 44.2 43.3  MCV 88.3 89.2 88.0 89.8  PLT 291 253 234 231   Basic Metabolic Panel: Recent Labs  Lab 05/09/24 1744 05/11/24 1242 05/12/24 0853 05/13/24 0421  NA 137 137 135 139  K 4.3 4.7 4.1 4.3  CL 98 97* 101 102  CO2 22 19* 20* 30  GLUCOSE 93 86 139* 76  BUN 24* 22 18 15   CREATININE 1.15* 1.14* 0.91  0.88  CALCIUM 10.3 10.5* 9.5 9.7   GFR: Estimated Creatinine Clearance: 58.7 mL/min (by C-G formula based on SCr of 0.88 mg/dL). Liver Function Tests: Recent Labs  Lab 05/09/24 1744 05/11/24 1242 05/12/24 0853 05/13/24 0421  AST 27 72* 39 28  ALT 53* 246* 148* 100*  ALKPHOS 124 221* 166* 147*  BILITOT 0.6 1.2 0.6 0.6  PROT 7.9 8.1 6.6 6.1*  ALBUMIN  4.8 4.6 3.9 3.5   Recent Labs  Lab 05/09/24 1744 05/11/24 1242  LIPASE 119* 32   No results for input(s): AMMONIA in the last 168 hours. Coagulation Profile: No results for input(s): INR, PROTIME in the last 168 hours. Cardiac Enzymes: No results for input(s): CKTOTAL, CKMB, CKMBINDEX, TROPONINI in the last 168 hours. BNP (last 3 results) No results for input(s): PROBNP in the last 8760 hours. HbA1C: No results for input(s): HGBA1C in the last 72 hours. CBG: Recent Labs  Lab 05/11/24 2054 05/12/24 1608 05/13/24 0006 05/13/24 0732  GLUCAP 69* 71 75 79   Lipid Profile: No results for input(s): CHOL, HDL, LDLCALC, TRIG, CHOLHDL, LDLDIRECT in the last 72 hours. Thyroid Function Tests: No results for input(s): TSH, T4TOTAL, FREET4, T3FREE, THYROIDAB in the last 72 hours. Anemia Panel: No results for input(s): VITAMINB12, FOLATE, FERRITIN, TIBC, IRON, RETICCTPCT in the last 72 hours. Sepsis Labs: No results for input(s): PROCALCITON, LATICACIDVEN in the last 168 hours.  No results found for this or any previous visit (from the past 240 hours).       Radiology Studies: MR ABDOMEN MRCP W WO CONTAST Result Date: 05/12/2024 EXAM: MRCP WITH AND WITHOUT IV CONTRAST 05/11/2024 02:51:57 PM TECHNIQUE: Multisequence, multiplanar magnetic resonance images of the abdomen with and without intravenous contrast. MRCP sequences were performed. 7.5 mL (gadobutrol  (GADAVIST ) 1 MMOL/ML injection 7.5 mL GADOBUTROL  1 MMOL/ML IV SOLN) was administered. COMPARISON: 05/09/2024 CLINICAL  HISTORY: elevated LFTs abdominal pain FINDINGS: LIVER: Hepatic steatosis. GALLBLADDER AND BILIARY SYSTEM: Status post cholecystectomy. Dilatation of the common bile duct measures up to 1.1 cm with mild intrahepatic bile duct dilatation. No choledocholithiasis identified. SPLEEN: Unremarkable. PANCREAS/PANCREATIC DUCT: Interval development: There is mild enlargement and edema within the head of the pancreas with focal hyperechogenicity enhancement and mild surrounding inflammatory soft tissue stranding. Within the head of the pancreas, there are several mildly T2 hyperintense foci which measure up to 7 mm, possibly representing early pseudocysts in the setting of acute pancreatitis. There are a few scattered areas of side branch duct ectasia. Soft tissue stranding and fluid are identified along the pancreaticoduodenal groove. ADRENAL GLANDS: Unremarkable. KIDNEYS: Unremarkable. LYMPH NODES: No enlarged abdominal lymph nodes. VASCULATURE: Unremarkable. PERITONEUM: No ascites. ABDOMINAL WALL: No hernia. No mass. BOWEL: Grossly unremarkable. No bowel obstruction. BONES: No acute abnormality or worrisome osseous lesion. SOFT TISSUES: Unremarkable. MISCELLANEOUS: Unremarkable. IMPRESSION: 1. Acute pancreatitis with mild enlargement and edema of the pancreatic head, possible early pseudocysts, and mild surrounding inflammatory soft tissue stranding. 2. Status post cholecystectomy with dilatation of the common bile duct measuring up  to 1.1 cm and mild intrahepatic bile duct dilatation, without choledocholithiasis. 3. Hepatic steatosis. Electronically signed by: Waddell Calk MD 05/12/2024 05:23 AM EST RP Workstation: [id number]   MR 3D Recon At Scanner Result Date: 05/12/2024 EXAM: MRCP WITH AND WITHOUT IV CONTRAST 05/11/2024 02:51:57 PM TECHNIQUE: Multisequence, multiplanar magnetic resonance images of the abdomen with and without intravenous contrast. MRCP sequences were performed. 7.5 mL (gadobutrol  (GADAVIST ) 1  MMOL/ML injection 7.5 mL GADOBUTROL  1 MMOL/ML IV SOLN) was administered. COMPARISON: 05/09/2024 CLINICAL HISTORY: elevated LFTs abdominal pain FINDINGS: LIVER: Hepatic steatosis. GALLBLADDER AND BILIARY SYSTEM: Status post cholecystectomy. Dilatation of the common bile duct measures up to 1.1 cm with mild intrahepatic bile duct dilatation. No choledocholithiasis identified. SPLEEN: Unremarkable. PANCREAS/PANCREATIC DUCT: Interval development: There is mild enlargement and edema within the head of the pancreas with focal hyperechogenicity enhancement and mild surrounding inflammatory soft tissue stranding. Within the head of the pancreas, there are several mildly T2 hyperintense foci which measure up to 7 mm, possibly representing early pseudocysts in the setting of acute pancreatitis. There are a few scattered areas of side branch duct ectasia. Soft tissue stranding and fluid are identified along the pancreaticoduodenal groove. ADRENAL GLANDS: Unremarkable. KIDNEYS: Unremarkable. LYMPH NODES: No enlarged abdominal lymph nodes. VASCULATURE: Unremarkable. PERITONEUM: No ascites. ABDOMINAL WALL: No hernia. No mass. BOWEL: Grossly unremarkable. No bowel obstruction. BONES: No acute abnormality or worrisome osseous lesion. SOFT TISSUES: Unremarkable. MISCELLANEOUS: Unremarkable. IMPRESSION: 1. Acute pancreatitis with mild enlargement and edema of the pancreatic head, possible early pseudocysts, and mild surrounding inflammatory soft tissue stranding. 2. Status post cholecystectomy with dilatation of the common bile duct measuring up to 1.1 cm and mild intrahepatic bile duct dilatation, without choledocholithiasis. 3. Hepatic steatosis. Electronically signed by: Waddell Calk MD 05/12/2024 05:23 AM EST RP Workstation: [id number]        Scheduled Meds:  citalopram   40 mg Oral Daily   enoxaparin  (LOVENOX ) injection  40 mg Subcutaneous Q24H   gabapentin   600 mg Oral QHS   levothyroxine   175 mcg Oral QAC breakfast    pantoprazole  (PROTONIX ) IV  40 mg Intravenous Q24H   QUEtiapine   50 mg Oral QHS   Continuous Infusions:  lactated ringers  100 mL/hr at 05/13/24 9197          Derryl Duval, MD [facility] 05/13/2024, 9:57 AM   "

## 2024-05-13 NOTE — Discharge Summary (Signed)
 Physician Discharge Summary  Stephanie Ewing FMW:[id number] DOB: [birth year] DOA: 05/11/2024  PCP: Leavy Waddell NOVAK, FNP  Admit date: 05/11/2024 Discharge date: 05/13/2024  Admitted From: Home Disposition: Home  Recommendations for Outpatient Follow-up:  Follow up with PCP in 1 week.  We recommend not taking Jardiance and Mounjaro due to risk of acute pancreatitis.  Please discuss with your primary care provider regarding alternatives. Please stop smoking. Follow up in ED if recurrence of symptoms  Home Health: No Equipment/Devices: None  Discharge Condition: Stable CODE STATUS: Full Diet recommendation: Low-fat/cholesterol diet  Brief/Interim Summary:  Stephanie Ewing is a 68 y.o. female with medical history significant for hypertension, pancreatitis,  cholecystectomy, atrial fibrillation, hypothyroidism presented to the hospital with nausea, vomiting and upper abdominal pain that started 4 days ago.  Patient presented to the ER twice in 2 days.  On 12/26 lipase was mildly elevated 119, CT abdomen and pelvis with contrast was without acute abnormalities.  Patient was sent home, returns with persistent symptoms.   Vital signs are stable.  Elevated white count 16.3.  Hemoconcentrated.  Liver enzymes are elevated with AST 72, ALT 246, ALP 221, total bili 1.2.  MRCP shows acute pancreatitis with mildly dilated CBD with no definite choledocholithiasis.  Patient admitted for further management.   She was managed conservatively with IV fluids, analgesics.  Follow-up liver enzymes were much improved.  Etiology unclear.  Patient takes Jardiance and Mounjaro which could be the potential etiology.  Discussed with patient about discontinuing them and alternatives however she is not ready to discontinue Mounjaro as it has done well for her weight.  We recommend discussing this with primary care provider.  Patient was able to tolerate diet well prior to discharge. Discharged home in stable  condition.   Discharge Diagnoses:  Principal Problem:   Intractable abdominal pain Active Problems:   Epigastric abdominal pain   Elevated LFTs    Discharge Instructions  Discharge Instructions     Call MD for:  persistant nausea and vomiting   Complete by: As directed    Call MD for:  severe uncontrolled pain   Complete by: As directed    Discharge instructions   Complete by: As directed    1.  We recommend sticking with low-fat/cholesterol diet for the next 1 to 2 weeks. 2.  We strongly advise smoking cessation 3.  We recommend not taking Jardiance and Mounjaro due to risk of acute pancreatitis.  Please discuss with your primary care provider regarding alternatives.   Increase activity slowly   Complete by: As directed       Allergies as of 05/13/2024       Reactions   Semaglutide Other (See Comments)   Pancreatitis with Lipase of 499.        Medication List     TAKE these medications    pramipexole 0.5 MG tablet Commonly known as: MIRAPEX Take 0.5 mg by mouth at bedtime. The timing of this medication is very important.   acetaminophen  325 MG tablet Commonly known as: TYLENOL  Take 2 tablets (650 mg total) by mouth every 6 (six) hours. What changed:  when to take this reasons to take this   albuterol  108 (90 Base) MCG/ACT inhaler Commonly known as: VENTOLIN  HFA Inhale into the lungs every 6 (six) hours as needed for wheezing or shortness of breath.   ALPRAZolam 0.5 MG tablet Commonly known as: XANAX Take 0.5 mg by mouth 3 (three) times daily as needed.   aspirin  EC 81  MG tablet Take 1 tablet (81 mg total) by mouth at bedtime.   B-12 5000 MCG Subl Take 1 tablet by mouth daily.   baclofen  10 MG tablet Commonly known as: LIORESAL  Take 1 tablet (10 mg total) by mouth at bedtime.   citalopram  40 MG tablet Commonly known as: CELEXA  Take 40 mg by mouth daily.   diltiazem  30 MG tablet Commonly known as: Cardizem  Take 1 tablet every 4 hours AS  NEEDED for heart rate >100 as long as top blood pressure >100.   fexofenadine 180 MG tablet Commonly known as: ALLEGRA Take 180 mg by mouth in the morning.   gabapentin  600 MG tablet Commonly known as: NEURONTIN  Take 600 mg by mouth at bedtime.   Hydromet 5-1.5 MG/5ML syrup Generic drug: HYDROcodone  bit-homatropine Take 5 mLs by mouth 3 (three) times daily as needed.   Jardiance 10 MG Tabs tablet Generic drug: empagliflozin Take 10 mg by mouth daily.   levothyroxine  175 MCG tablet Commonly known as: SYNTHROID  Take 175 mcg by mouth daily before breakfast.   meloxicam 15 MG tablet Commonly known as: MOBIC Take 15 mg by mouth daily as needed.   montelukast  10 MG tablet Commonly known as: SINGULAIR  Take 10 mg by mouth daily.   Mounjaro 12.5 MG/0.5ML Pen Generic drug: tirzepatide Inject 12.5 mg into the skin once a week.   oxyCODONE  5 MG immediate release tablet Commonly known as: Roxicodone  Take 1 tablet (5 mg total) by mouth every 4 (four) hours as needed.   pantoprazole  40 MG tablet Commonly known as: PROTONIX  TAKE 1 TABLET BY MOUTH TWICE DAILY BEFORE A MEAL   QUEtiapine  50 MG tablet Commonly known as: SEROQUEL  Take 50 mg by mouth at bedtime.   rosuvastatin 5 MG tablet Commonly known as: CRESTOR Take 5 mg by mouth daily.   sodium chloride  0.65 % Soln nasal spray Commonly known as: OCEAN Place 1 spray into both nostrils as needed for congestion.   spironolactone  25 MG tablet Commonly known as: ALDACTONE  Take 25 mg by mouth daily.   Ultram  50 MG tablet Generic drug: traMADol  Take 50 mg by mouth every 12 (twelve) hours as needed.   Vitamin D 50 MCG (2000 UT) Caps Take 2,000 Units by mouth in the morning.        Follow-up Information     Leavy Waddell NOVAK, FNP. Schedule an appointment as soon as possible for a visit in 1 week(s).   Specialty: Family Medicine Contact information: [address] [place] [address] [phone]                 Allergies[1]  Consultations:    Procedures/Studies: MR ABDOMEN MRCP W WO CONTAST Result Date: 05/12/2024 EXAM: MRCP WITH AND WITHOUT IV CONTRAST 05/11/2024 02:51:57 PM TECHNIQUE: Multisequence, multiplanar magnetic resonance images of the abdomen with and without intravenous contrast. MRCP sequences were performed. 7.5 mL (gadobutrol  (GADAVIST ) 1 MMOL/ML injection 7.5 mL GADOBUTROL  1 MMOL/ML IV SOLN) was administered. COMPARISON: 05/09/2024 CLINICAL HISTORY: elevated LFTs abdominal pain FINDINGS: LIVER: Hepatic steatosis. GALLBLADDER AND BILIARY SYSTEM: Status post cholecystectomy. Dilatation of the common bile duct measures up to 1.1 cm with mild intrahepatic bile duct dilatation. No choledocholithiasis identified. SPLEEN: Unremarkable. PANCREAS/PANCREATIC DUCT: Interval development: There is mild enlargement and edema within the head of the pancreas with focal hyperechogenicity enhancement and mild surrounding inflammatory soft tissue stranding. Within the head of the pancreas, there are several mildly T2 hyperintense foci which measure up to 7 mm, possibly representing early pseudocysts in the  setting of acute pancreatitis. There are a few scattered areas of side branch duct ectasia. Soft tissue stranding and fluid are identified along the pancreaticoduodenal groove. ADRENAL GLANDS: Unremarkable. KIDNEYS: Unremarkable. LYMPH NODES: No enlarged abdominal lymph nodes. VASCULATURE: Unremarkable. PERITONEUM: No ascites. ABDOMINAL WALL: No hernia. No mass. BOWEL: Grossly unremarkable. No bowel obstruction. BONES: No acute abnormality or worrisome osseous lesion. SOFT TISSUES: Unremarkable. MISCELLANEOUS: Unremarkable. IMPRESSION: 1. Acute pancreatitis with mild enlargement and edema of the pancreatic head, possible early pseudocysts, and mild surrounding inflammatory soft tissue stranding. 2. Status post cholecystectomy with dilatation of the common bile duct measuring up to 1.1 cm and mild intrahepatic  bile duct dilatation, without choledocholithiasis. 3. Hepatic steatosis. Electronically signed by: Waddell Calk MD 05/12/2024 05:23 AM EST RP Workstation: [id number]   MR 3D Recon At Scanner Result Date: 05/12/2024 EXAM: MRCP WITH AND WITHOUT IV CONTRAST 05/11/2024 02:51:57 PM TECHNIQUE: Multisequence, multiplanar magnetic resonance images of the abdomen with and without intravenous contrast. MRCP sequences were performed. 7.5 mL (gadobutrol  (GADAVIST ) 1 MMOL/ML injection 7.5 mL GADOBUTROL  1 MMOL/ML IV SOLN) was administered. COMPARISON: 05/09/2024 CLINICAL HISTORY: elevated LFTs abdominal pain FINDINGS: LIVER: Hepatic steatosis. GALLBLADDER AND BILIARY SYSTEM: Status post cholecystectomy. Dilatation of the common bile duct measures up to 1.1 cm with mild intrahepatic bile duct dilatation. No choledocholithiasis identified. SPLEEN: Unremarkable. PANCREAS/PANCREATIC DUCT: Interval development: There is mild enlargement and edema within the head of the pancreas with focal hyperechogenicity enhancement and mild surrounding inflammatory soft tissue stranding. Within the head of the pancreas, there are several mildly T2 hyperintense foci which measure up to 7 mm, possibly representing early pseudocysts in the setting of acute pancreatitis. There are a few scattered areas of side branch duct ectasia. Soft tissue stranding and fluid are identified along the pancreaticoduodenal groove. ADRENAL GLANDS: Unremarkable. KIDNEYS: Unremarkable. LYMPH NODES: No enlarged abdominal lymph nodes. VASCULATURE: Unremarkable. PERITONEUM: No ascites. ABDOMINAL WALL: No hernia. No mass. BOWEL: Grossly unremarkable. No bowel obstruction. BONES: No acute abnormality or worrisome osseous lesion. SOFT TISSUES: Unremarkable. MISCELLANEOUS: Unremarkable. IMPRESSION: 1. Acute pancreatitis with mild enlargement and edema of the pancreatic head, possible early pseudocysts, and mild surrounding inflammatory soft tissue stranding. 2. Status post  cholecystectomy with dilatation of the common bile duct measuring up to 1.1 cm and mild intrahepatic bile duct dilatation, without choledocholithiasis. 3. Hepatic steatosis. Electronically signed by: Waddell Calk MD 05/12/2024 05:23 AM EST RP Workstation: [id number]   CT ABDOMEN PELVIS W CONTRAST Result Date: 05/09/2024 EXAM: CT ABDOMEN AND PELVIS WITH CONTRAST 05/09/2024 07:35:34 PM TECHNIQUE: CT of the abdomen and pelvis was performed with the administration of 100 mL of iohexol  (OMNIPAQUE ) 300 MG/ML solution. Multiplanar reformatted images are provided for review. Automated exposure control, iterative reconstruction, and/or weight-based adjustment of the mA/kV was utilized to reduce the radiation dose to as low as reasonably achievable. COMPARISON: 01/04/2023 CLINICAL HISTORY: Abdominal pain, acute, nonlocalized; Pancreatitis, acute, severe. FINDINGS: LOWER CHEST: No acute abnormality. LIVER: The liver is unremarkable. GALLBLADDER AND BILE DUCTS: Status post cholecystectomy. Mild dilation of the biliary tree likely reflects post cholecystectomy change. SPLEEN: No acute abnormality. PANCREAS: There is asymmetric fatty infiltration of the head and body of the pancreas, similar to prior examination, possibly the sequela of prior inflammation. There are, however, no superimposed acute pancreatic inflammatory changes identified. The pancreatic duct is not dilated. No loculated pancreatic fluid collections. ADRENAL GLANDS: No acute abnormality. KIDNEYS, URETERS AND BLADDER: No stones in the kidneys or ureters. No hydronephrosis. No perinephric or periureteral stranding. Urinary bladder is unremarkable. GI  AND BOWEL: Stomach demonstrates no acute abnormality. There is no bowel obstruction. PERITONEUM AND RETROPERITONEUM: No ascites. No free air. VASCULATURE: Mild aortoiliac atherosclerotic calcification. No aortic aneurysm. LYMPH NODES: No lymphadenopathy. REPRODUCTIVE ORGANS: The uterus is absent. No adnexal  masses are seen. BONES AND SOFT TISSUES: Osseous structures are age appropriate. No acute bone abnormality. No lytic or blastic bone lesion. No focal soft tissue abnormality. IMPRESSION: 1. No acute abdominopelvic abnormality, with no acute pancreatitis. 2. Asymmetric fatty infiltration of the pancreatic head and body, similar to prior, possibly sequela of prior inflammation. 3. Status post cholecystectomy with mild biliary tree dilation, likely postcholecystectomy change. 4. Status post hysterectomy. 5. Raf score: Aortic atherosclerosis (icd10-i70.0). Electronically signed by: Dorethia Molt MD 05/09/2024 09:35 PM EST RP Workstation: [id number]   ECHOCARDIOGRAM COMPLETE Result Date: 04/20/2024    ECHOCARDIOGRAM REPORT   Patient Name:   Stephanie Ewing Date of Exam: 04/16/2024 Medical Rec #:  [id number]        Height:       67.0 in Accession #:    [id number]       Weight:       169.4 lb Date of Birth:  1956-01-17       BSA:          1.884 m Patient Age:    68 years         BP:           116/70 mmHg Patient Gender: F                HR:           74 bpm. Exam Location:  [facility] Procedure: 2D Echo, Cardiac Doppler, Color Doppler and Strain Analysis (Both            Spectral and Color Flow Doppler were utilized during procedure). Indications:    I35.1 Nonrheumatic aortic (valve) insufficiency  History:        Patient has prior history of Echocardiogram examinations, most                 recent 01/09/2022. Aortic Valve Disease, Arrythmias:PVC and                 Atrial Fibrillation; Risk Factors:Hypertension and Current                 Smoker.  Sonographer:    Bascom Burows RCS, RVS Referring Phys: [id number] DORN FALCON BRANCH IMPRESSIONS  1. Left ventricular ejection fraction, by estimation, is 60 to 65%. The left ventricle has normal function. The left ventricle has no regional wall motion abnormalities. Left ventricular diastolic parameters are consistent with Grade I diastolic dysfunction (impaired relaxation). The  average left ventricular global longitudinal strain is -18.0 %. The global longitudinal strain is normal.  2. Right ventricular systolic function is normal. The right ventricular size is normal. Tricuspid regurgitation signal is inadequate for assessing PA pressure.  3. The mitral valve is normal in structure. Trivial mitral valve regurgitation. No evidence of mitral stenosis.  4. The aortic valve was not well visualized. There is mild calcification of the aortic valve. There is mild thickening of the aortic valve. Aortic valve regurgitation is mild to moderate. No aortic stenosis is present.  5. The inferior vena cava is normal in size with greater than 50% respiratory variability, suggesting right atrial pressure of 3 mmHg. Comparison(s): A prior study was performed on 01/09/2022. EF 60-65%. Mild MR. Mild AI (PHT 558m/sec). FINDINGS  Left Ventricle: Left ventricular ejection  fraction, by estimation, is 60 to 65%. The left ventricle has normal function. The left ventricle has no regional wall motion abnormalities. The average left ventricular global longitudinal strain is -18.0 %. Strain was performed and the global longitudinal strain is normal. The left ventricular internal cavity size was normal in size. There is no left ventricular hypertrophy. Left ventricular diastolic parameters are consistent with Grade I diastolic dysfunction (impaired relaxation). Normal left ventricular filling pressure. Right Ventricle: The right ventricular size is normal. Right vetricular wall thickness was not well visualized. Right ventricular systolic function is normal. Tricuspid regurgitation signal is inadequate for assessing PA pressure. Left Atrium: Left atrial size was normal in size. Right Atrium: Right atrial size was normal in size. Pericardium: There is no evidence of pericardial effusion. Mitral Valve: The mitral valve is normal in structure. There is mild thickening of the mitral valve leaflet(s). There is mild  calcification of the mitral valve leaflet(s). Mild mitral annular calcification. Trivial mitral valve regurgitation. No evidence  of mitral valve stenosis. MV peak gradient, 6.5 mmHg. The mean mitral valve gradient is 3.0 mmHg. Tricuspid Valve: The tricuspid valve is normal in structure. Tricuspid valve regurgitation is trivial. No evidence of tricuspid stenosis. Aortic Valve: The aortic valve was not well visualized. There is mild calcification of the aortic valve. There is mild thickening of the aortic valve. There is mild aortic valve annular calcification. Aortic valve regurgitation is mild to moderate. Aortic regurgitation PHT measures 576 msec. No aortic stenosis is present. Aortic valve mean gradient measures 7.7 mmHg. Aortic valve peak gradient measures 14.7 mmHg. Aortic valve area, by VTI measures 1.83 cm. Pulmonic Valve: The pulmonic valve was not well visualized. Pulmonic valve regurgitation is not visualized. No evidence of pulmonic stenosis. Aorta: The aortic root and ascending aorta are structurally normal, with no evidence of dilitation. Venous: The inferior vena cava is normal in size with greater than 50% respiratory variability, suggesting right atrial pressure of 3 mmHg. IAS/Shunts: No atrial level shunt detected by color flow Doppler. Additional Comments: 3D was performed not requiring image post processing on an independent workstation and was normal.  LEFT VENTRICLE PLAX 2D LVIDd:         4.00 cm     Diastology LVIDs:         2.60 cm     LV e' medial:    5.22 cm/s LV PW:         1.10 cm     LV E/e' medial:  18.5 LV IVS:        1.10 cm     LV e' lateral:   8.59 cm/s LVOT diam:     2.00 cm     LV E/e' lateral: 11.3 LV SV:         80 LV SV Index:   42          2D Longitudinal Strain LVOT Area:     3.14 cm    2D Strain GLS (A4C):   -17.7 %                            2D Strain GLS (A3C):   -17.0 %                            2D Strain GLS (A2C):   -19.3 % LV Volumes (MOD)           2D Strain  GLS  Avg:     -18.0 % LV vol d, MOD A2C: 48.8 ml LV vol d, MOD A4C: 52.3 ml LV vol s, MOD A2C: 14.3 ml LV vol s, MOD A4C: 17.6 ml LV SV MOD A2C:     34.5 ml LV SV MOD A4C:     52.3 ml LV SV MOD BP:      35.0 ml RIGHT VENTRICLE             IVC RV Basal diam:  2.90 cm     IVC diam: 1.40 cm RV Mid diam:    2.30 cm RV S prime:     11.40 cm/s  PULMONARY VEINS TAPSE (M-mode): 2.2 cm      Diastolic Velocity: 47.10 cm/s                             S/D Velocity:       1.40                             Systolic Velocity:  65.10 cm/s LEFT ATRIUM             Index        RIGHT ATRIUM          Index LA diam:        3.30 cm 1.75 cm/m   RA Area:     8.97 cm LA Vol (A2C):   36.5 ml 19.37 ml/m  RA Volume:   14.70 ml 7.80 ml/m LA Vol (A4C):   32.4 ml 17.19 ml/m LA Biplane Vol: 35.7 ml 18.94 ml/m  AORTIC VALVE                     PULMONIC VALVE AV Area (Vmax):    1.94 cm      PV Vmax:       0.74 m/s AV Area (Vmean):   1.72 cm      PV Peak grad:  2.2 mmHg AV Area (VTI):     1.83 cm AV Vmax:           191.45 cm/s AV Vmean:          129.180 cm/s AV VTI:            0.437 m AV Peak Grad:      14.7 mmHg AV Mean Grad:      7.7 mmHg LVOT Vmax:         118.12 cm/s LVOT Vmean:        70.817 cm/s LVOT VTI:          0.254 m LVOT/AV VTI ratio: 0.58 AI PHT:            576 msec AR Vena Contracta: 0.50 cm  AORTA Ao Root diam: 2.70 cm Ao Asc diam:  3.40 cm MITRAL VALVE MV Area (PHT): 3.31 cm     SHUNTS MV Peak grad:  6.5 mmHg     Systemic VTI:  0.25 m MV Mean grad:  3.0 mmHg     Systemic Diam: 2.00 cm MV Vmax:       1.27 m/s MV Vmean:      83.5 cm/s MV Decel Time: 229 msec MV E velocity: 96.80 cm/s MV A velocity: 119.00 cm/s MV E/A ratio:  0.81 Dorn Ross MD Electronically signed by Dorn Ross MD Signature Date/Time: 04/20/2024/4:21:37 PM    Final  Subjective:   Discharge Exam: Vitals:   05/13/24 0413 05/13/24 0746  BP: 134/66 124/63  Pulse: 78 73  Resp: 17 16  Temp: 97.6 F (36.4 C)   SpO2: 92% 95%    General: Pt  is alert, awake, not in acute distress Cardiovascular: rate controlled, S1/S2 + Respiratory: bilateral decreased breath sounds at bases Abdominal: Soft, NT, ND, bowel sounds + Extremities: no edema, no cyanosis    The results of significant diagnostics from this hospitalization (including imaging, microbiology, ancillary and laboratory) are listed below for reference.     Microbiology: No results found for this or any previous visit (from the past 240 hours).   Labs: BNP (last 3 results) No results for input(s): BNP in the last 8760 hours. Basic Metabolic Panel: Recent Labs  Lab 05/09/24 1744 05/11/24 1242 05/12/24 0853 05/13/24 0421  NA 137 137 135 139  K 4.3 4.7 4.1 4.3  CL 98 97* 101 102  CO2 22 19* 20* 30  GLUCOSE 93 86 139* 76  BUN 24* 22 18 15   CREATININE 1.15* 1.14* 0.91 0.88  CALCIUM 10.3 10.5* 9.5 9.7   Liver Function Tests: Recent Labs  Lab 05/09/24 1744 05/11/24 1242 05/12/24 0853 05/13/24 0421  AST 27 72* 39 28  ALT 53* 246* 148* 100*  ALKPHOS 124 221* 166* 147*  BILITOT 0.6 1.2 0.6 0.6  PROT 7.9 8.1 6.6 6.1*  ALBUMIN  4.8 4.6 3.9 3.5   Recent Labs  Lab 05/09/24 1744 05/11/24 1242  LIPASE 119* 32   No results for input(s): AMMONIA in the last 168 hours. CBC: Recent Labs  Lab 05/09/24 1744 05/11/24 1242 05/12/24 0853 05/13/24 0421  WBC 13.1* 15.3* 10.7* 7.4  NEUTROABS  --   --  8.0*  --   HGB 16.9* 17.0* 14.6 13.7  HCT 52.1* 52.7* 44.2 43.3  MCV 88.3 89.2 88.0 89.8  PLT 291 253 234 231   Cardiac Enzymes: No results for input(s): CKTOTAL, CKMB, CKMBINDEX, TROPONINI in the last 168 hours. BNP: Invalid input(s): POCBNP CBG: Recent Labs  Lab 05/11/24 2054 05/12/24 1608 05/13/24 0006 05/13/24 0732  GLUCAP 69* 71 75 79   D-Dimer No results for input(s): DDIMER in the last 72 hours. Hgb A1c No results for input(s): HGBA1C in the last 72 hours. Lipid Profile No results for input(s): CHOL, HDL, LDLCALC,  TRIG, CHOLHDL, LDLDIRECT in the last 72 hours. Thyroid function studies No results for input(s): TSH, T4TOTAL, T3FREE, THYROIDAB in the last 72 hours.  Invalid input(s): FREET3 Anemia work up No results for input(s): VITAMINB12, FOLATE, FERRITIN, TIBC, IRON, RETICCTPCT in the last 72 hours. Urinalysis    Component Value Date/Time   COLORURINE YELLOW 05/11/2024 1219   APPEARANCEUR CLEAR 05/11/2024 1219   LABSPEC 1.026 05/11/2024 1219   PHURINE 5.0 05/11/2024 1219   GLUCOSEU >=500 (A) 05/11/2024 1219   HGBUR SMALL (A) 05/11/2024 1219   BILIRUBINUR NEGATIVE 05/11/2024 1219   BILIRUBINUR negative 04/18/2019 1553   KETONESUR 20 (A) 05/11/2024 1219   PROTEINUR NEGATIVE 05/11/2024 1219   UROBILINOGEN 0.2 04/18/2019 1553   NITRITE NEGATIVE 05/11/2024 1219   LEUKOCYTESUR NEGATIVE 05/11/2024 1219   Sepsis Labs Recent Labs  Lab 05/09/24 1744 05/11/24 1242 05/12/24 0853 05/13/24 0421  WBC 13.1* 15.3* 10.7* 7.4   Microbiology No results found for this or any previous visit (from the past 240 hours).   Time coordinating discharge: 35 minutes  SIGNED:   Herberto Ledwell, MD  [facility] 05/13/2024, 2:00 PM      [  1]  Allergies Allergen Reactions   Semaglutide Other (See Comments)    Pancreatitis with Lipase of 499.

## 2024-05-14 ENCOUNTER — Telehealth: Payer: Self-pay | Admitting: *Deleted

## 2024-05-14 NOTE — Telephone Encounter (Signed)
-----   Message from Elspeth Naval, MD sent at 05/13/2024  3:34 PM EST ----- Regarding: FW: Pancreatitis Requesting follow up with me or APP in the next 2-3 weeks for pancreatitis. thanks ----- Message ----- From: Kennedy Charmaine CROME, NP Sent: 05/13/2024   2:44 PM EST To: Elspeth SHAUNNA Naval, MD Subject: Pancreatitis                                   Hi Dr. Naval, Patient presented to [facility] with abdominal pain and was found to have mild transaminitis with elevated alkaline phosphatase and MRI/MRCP with some biliary ductal dilation and acute pancreatitis.  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  I wanted to reach out to you to make sure that she gets a follow-up appointment with you to discuss ongoing management of her pancreatitis and her other chronic GI issues with you.  She is being discharged today 12/30.  Thank you, Charmaine Kennedy, MSN, APRN, FNP-BC, AGACNP-BC [facility] at [facility]

## 2024-05-14 NOTE — Telephone Encounter (Addendum)
 Spoke to patient to advise of appointment with Elida Shawl, NP on 06/05/24 at 330 pm. Patient verbalizes understanding.

## 2024-05-17 ENCOUNTER — Ambulatory Visit: Payer: Self-pay | Admitting: Cardiology

## 2024-05-21 ENCOUNTER — Encounter: Payer: Self-pay | Admitting: *Deleted

## 2024-06-05 ENCOUNTER — Encounter: Payer: Self-pay | Admitting: Nurse Practitioner

## 2024-06-05 ENCOUNTER — Ambulatory Visit (INDEPENDENT_AMBULATORY_CARE_PROVIDER_SITE_OTHER): Admitting: Nurse Practitioner

## 2024-06-05 ENCOUNTER — Other Ambulatory Visit

## 2024-06-05 VITALS — BP 126/70 | HR 69 | Ht 67.0 in | Wt 161.1 lb

## 2024-06-05 DIAGNOSIS — R7989 Other specified abnormal findings of blood chemistry: Secondary | ICD-10-CM

## 2024-06-05 DIAGNOSIS — R7401 Elevation of levels of liver transaminase levels: Secondary | ICD-10-CM | POA: Diagnosis not present

## 2024-06-05 DIAGNOSIS — K8500 Idiopathic acute pancreatitis without necrosis or infection: Secondary | ICD-10-CM

## 2024-06-05 DIAGNOSIS — Z860101 Personal history of adenomatous and serrated colon polyps: Secondary | ICD-10-CM | POA: Diagnosis not present

## 2024-06-05 DIAGNOSIS — K859 Acute pancreatitis without necrosis or infection, unspecified: Secondary | ICD-10-CM | POA: Diagnosis not present

## 2024-06-05 LAB — COMPREHENSIVE METABOLIC PANEL WITH GFR
ALT: 38 U/L — ABNORMAL HIGH (ref 3–35)
AST: 23 U/L (ref 5–37)
Albumin: 4.7 g/dL (ref 3.5–5.2)
Alkaline Phosphatase: 110 U/L (ref 39–117)
BUN: 20 mg/dL (ref 6–23)
CO2: 28 meq/L (ref 19–32)
Calcium: 10.8 mg/dL — ABNORMAL HIGH (ref 8.4–10.5)
Chloride: 103 meq/L (ref 96–112)
Creatinine, Ser: 1.13 mg/dL (ref 0.40–1.20)
GFR: 49.74 mL/min — ABNORMAL LOW
Glucose, Bld: 90 mg/dL (ref 70–99)
Potassium: 4 meq/L (ref 3.5–5.1)
Sodium: 139 meq/L (ref 135–145)
Total Bilirubin: 0.5 mg/dL (ref 0.2–1.2)
Total Protein: 7.7 g/dL (ref 6.0–8.3)

## 2024-06-05 LAB — CBC
HCT: 48.4 % — ABNORMAL HIGH (ref 36.0–46.0)
Hemoglobin: 16.1 g/dL — ABNORMAL HIGH (ref 12.0–15.0)
MCHC: 33.2 g/dL (ref 30.0–36.0)
MCV: 87.3 fl (ref 78.0–100.0)
Platelets: 244 K/uL (ref 150.0–400.0)
RBC: 5.54 Mil/uL — ABNORMAL HIGH (ref 3.87–5.11)
RDW: 14.6 % (ref 11.5–15.5)
WBC: 7.3 K/uL (ref 4.0–10.5)

## 2024-06-05 LAB — LIPASE: Lipase: 55 U/L (ref 11.0–59.0)

## 2024-06-05 NOTE — Patient Instructions (Signed)
 _______________________________________________________  If your blood pressure at your visit was 140/90 or greater, please contact your primary care physician to follow up on this.  _______________________________________________________  If you are age 70 or older, your body mass index should be between 23-30. Your Body mass index is 25.24 kg/m. If this is out of the aforementioned range listed, please consider follow up with your Primary Care Provider.  If you are age 67 or younger, your body mass index should be between 19-25. Your Body mass index is 25.24 kg/m. If this is out of the aformentioned range listed, please consider follow up with your Primary Care Provider.   ________________________________________________________  The [facility] providers would like to encourage you to use MYCHART to communicate with providers for non-urgent requests or questions.  Due to long hold times on the telephone, sending your provider a message by [facility] may be a faster and more efficient way to get a response.  Please allow 48 business hours for a response.  Please remember that this is for non-urgent requests.  _______________________________________________________  [facility] is using a team-based approach to care.  Your team is made up of your doctor and two to three APPS. Our APPS (Nurse Practitioners and Physician Assistants) work with your physician to ensure care continuity for you. They are fully qualified to address your health concerns and develop a treatment plan. They communicate directly with your gastroenterologist to care for you. Seeing the Advanced Practice Practitioners on your physician's team can help you by facilitating care more promptly, often allowing for earlier appointments, access to diagnostic testing, procedures, and other specialty referrals.   Your provider has requested that you go to the basement level for lab work before leaving today. Press B on the  elevator. The lab is located at the first door on the left as you exit the elevator.  You have been scheduled for an MRI/MRCP at [facility] on 07-03-24. Your appointment time is 9am. Please arrive to admitting (at main entrance of the hospital) 30 minutes prior to your appointment time for registration purposes. Please make certain not to have anything to eat or drink 4 hours prior to your test. In addition, if you have any metal in your body, have a pacemaker or defibrillator, please be sure to let your ordering physician know. This test typically takes 45 minutes to 1 hour to complete. Should you need to reschedule, please call [phone] to do so.  REDUCE carbohydrates in diet  GO to the ED if you develop severe nausea and vomiting or abdominal pain.  Due to recent changes in healthcare laws, you may see the results of your imaging and laboratory studies on MyChart before your provider has had a chance to review them.  We understand that in some cases there may be results that are confusing or concerning to you. Not all laboratory results come back in the same time frame and the provider may be waiting for multiple results in order to interpret others.  Please give us  48 hours in order for your provider to thoroughly review all the results before contacting the office for clarification of your results.   Thank you for entrusting me with your care and choosing [facility].  Elida Shawl, NP

## 2024-06-05 NOTE — Progress Notes (Signed)
 "    06/05/2024 Stephanie Ewing [id number] June 20, 1955  Primary Gastroenterologist: Dr. Leigh   Chief Complaint: Follow-up pancreatitis  History of Present Illness: Stephanie Ewing is a 69 year old female with a past medical history of hypertension, atrial fibrillation, hypothyroidism, GERD, colon polyps and pancreatitis.  She presented to [facility] ED 05/09/2024 with nausea, vomiting and epigastric pain x 2 days which was similar to the symptoms she had when previously diagnosed with acute pancreatitis.  She described epigastric pain which wrapped around the right and left rib cage through to the back.  Labs in the ED showed a WBC count of 13.1.  Hemoglobin 16.9.  124.  Creatinine 1.15.  Total bili 0.6.  Alk phos 124.  AST 27.  ALT 53.  Lipase 119. CTAP with contrast showed asymmetric fatty infiltration of the pancreatic head and body similar to prior studies without evidence of acute pancreatitis and status postcholecystectomy physiology with mild biliary tree dilatation.  He received IV fluids, antiemetics and a GI cocktail and her clinical status stabilized therefore she was discharged home.  Presented back to the ED 05/10/2024 due to persistent nausea, vomiting and epigastric pain. She left the ED prior to seeing the ED provider due to a long wait.  Her symptoms worsened therefore she went back to the ED and was admitted to the hospital 05/11/2024 with N/V and upper abdominal pain which had progressively worsened x 4 days. WBC 15.3.  Hemoglobin 17.  Total bili 1.2.  Alk phos 221.  AST 72.  ALT 246.  Acute hepatitis panel negative. Lipase 32.  MRI/MRCP showed acute pancreatitis with possible early pseudocysts, mildly dilated CBD with no definite choledocholithiasis.  Etiology for her pancreatitis remained unclear, possible Jardiance or Mounjaro.  Patient was willing to discontinue Jardiance at discharge but she elected to continue Mounjaro as she has successfully lost  35 lbs  since she started taking it 07/2023.  She was discharged home 05/13/2024.  She presents today for further GI follow-up.  She stated feeling great.  No nausea or vomiting.  She is eating a fairly healthy diet but endorses eating a lot of Vienna sausage.  No alcohol use.  No upper or lower abdominal pain.  Bowel movements are normal, no bloody or black stools.      Latest Ref Rng & Units 05/13/2024    4:21 AM 05/12/2024    8:53 AM 05/11/2024   12:42 PM  CBC  WBC 4.0 - 10.5 K/uL 7.4  10.7  15.3   Hemoglobin 12.0 - 15.0 g/dL 86.2  85.3  82.9   Hematocrit 36.0 - 46.0 % 43.3  44.2  52.7   Platelets 150 - 400 K/uL 231  234  253        Latest Ref Rng & Units 05/13/2024    4:21 AM 05/12/2024    8:53 AM 05/11/2024   12:42 PM  CMP  Glucose 70 - 99 mg/dL 76  860  86   BUN 8 - 23 mg/dL 15  18  22    Creatinine 0.44 - 1.00 mg/dL 9.11  9.08  8.85   Sodium 135 - 145 mmol/L 139  135  137   Potassium 3.5 - 5.1 mmol/L 4.3  4.1  4.7   Chloride 98 - 111 mmol/L 102  101  97   CO2 22 - 32 mmol/L 30  20  19    Calcium 8.9 - 10.3 mg/dL 9.7  9.5  89.4   Total Protein 6.5 -  8.1 g/dL 6.1  6.6  8.1   Total Bilirubin 0.0 - 1.2 mg/dL 0.6  0.6  1.2   Alkaline Phos 38 - 126 U/L 147  166  221   AST 15 - 41 U/L 28  39  72   ALT 0 - 44 U/L 100  148  246      Abdominal MRI/MRCP with and without contrast 05/11/2024: FINDINGS:   LIVER: Hepatic steatosis.   GALLBLADDER AND BILIARY SYSTEM: Status post cholecystectomy. Dilatation of the common bile duct measures up to 1.1 cm with mild intrahepatic bile duct dilatation. No choledocholithiasis identified.   SPLEEN: Unremarkable.   PANCREAS/PANCREATIC DUCT: Interval development: There is mild enlargement and edema within the head of the pancreas with focal hyperechogenicity enhancement and mild surrounding inflammatory soft tissue stranding. Within the head of the pancreas, there are several mildly T2 hyperintense foci which measure up to 7 mm,  possibly representing early pseudocysts in the setting of acute pancreatitis. There are a few scattered areas of side branch duct ectasia. Soft tissue stranding and fluid are identified along the pancreaticoduodenal groove.   ADRENAL GLANDS: Unremarkable.   KIDNEYS: Unremarkable.   LYMPH NODES: No enlarged abdominal lymph nodes.   VASCULATURE: Unremarkable.   PERITONEUM: No ascites.   ABDOMINAL WALL: No hernia. No mass.   BOWEL: Grossly unremarkable. No bowel obstruction.   BONES: No acute abnormality or worrisome osseous lesion.   SOFT TISSUES: Unremarkable.   MISCELLANEOUS: Unremarkable.   IMPRESSION: 1. Acute pancreatitis with mild enlargement and edema of the pancreatic head, possible early pseudocysts, and mild surrounding inflammatory soft tissue stranding. 2. Status post cholecystectomy with dilatation of the common bile duct measuring up to 1.1 cm and mild intrahepatic bile duct dilatation, without choledocholithiasis. 3. Hepatic steatosis.  CTAP 05/09/2024: FINDINGS:   LOWER CHEST: No acute abnormality.   LIVER: The liver is unremarkable.   GALLBLADDER AND BILE DUCTS: Status post cholecystectomy. Mild dilation of the biliary tree likely reflects post cholecystectomy change.   SPLEEN: No acute abnormality.   PANCREAS: There is asymmetric fatty infiltration of the head and body of the pancreas, similar to prior examination, possibly the sequela of prior inflammation. There are, however, no superimposed acute pancreatic inflammatory changes identified. The pancreatic duct is not dilated. No loculated pancreatic fluid collections.   ADRENAL GLANDS: No acute abnormality.   KIDNEYS, URETERS AND BLADDER: No stones in the kidneys or ureters. No hydronephrosis. No perinephric or periureteral stranding. Urinary bladder is unremarkable.   GI AND BOWEL: Stomach demonstrates no acute abnormality. There is no bowel obstruction.   PERITONEUM AND  RETROPERITONEUM: No ascites. No free air.   VASCULATURE: Mild aortoiliac atherosclerotic calcification. No aortic aneurysm.   LYMPH NODES: No lymphadenopathy.   REPRODUCTIVE ORGANS: The uterus is absent. No adnexal masses are seen.   BONES AND SOFT TISSUES: Osseous structures are age appropriate. No acute bone abnormality. No lytic or blastic bone lesion. No focal soft tissue abnormality.   IMPRESSION: 1. No acute abdominopelvic abnormality, with no acute pancreatitis. 2. Asymmetric fatty infiltration of the pancreatic head and body, similar to prior, possibly sequela of prior inflammation. 3. Status post cholecystectomy with mild biliary tree dilation, likely postcholecystectomy change. 4. Status post hysterectomy. 5. Raf score: Aortic atherosclerosis   MRCP 07/21/2021: IMPRESSION: 1. Fatty infiltration and mild atrophy in the head and body of the pancreas. Several subcentimeter cystic foci throughout the pancreas most likely represent prominent ductal side branches or possibly small cysts/pseudocysts. No pancreatic ductal dilatation. Consider follow-up  MRI in 12 months. 2. Hepatomegaly with hepatic steatosis.      MRCP 06/23/2020: 1. Findings are highly concerning for primary pancreatic neoplasm in the inferior aspect of the pancreatic head measuring approximately 3.4 x 2.2 x 2.4 cm. This exerts mild mass effect upon the adjacent distal pancreatic duct and distal common bile duct just proximal to the ampulla, without frank ductal obstruction at this time. The possibility of focal pancreatitis is not excluded, however, further evaluation with endoscopic ultrasound is recommended in the near future to better evaluate this region and assess need for potential biopsy. 2. Multiple tiny subcentimeter cystic appearing lesions scattered elsewhere throughout the pancreas, favored to represent small benign lesions such as side-branch IPMN and/or small pancreatic pseudocysts.  Repeat abdominal MRI with and without IV gadolinium with MRCP is recommended in 1 year to ensure stability of these lesions. This recommendation follows ACR consensus guidelines: Management of Incidental Pancreatic Cysts: A White Paper of the ACR Incidental Findings Committee. J Am Coll Radiol 2017;14:911-923.   GI PROCEDURES:   EGD/EUS 06/28/20: EGD Impression: - No gross lesions in esophagus proximally. LA Grade B esophagitis with no bleeding distally. - Shatzki ring present. - Hiatal hernia noted. - Erythematous mucosa in the gastric body, incisura and antrum. No other gross lesions in the stomach. Biopsied. - Duodenal polyp in D2 noted. This was removed with cold snare polypectomy. - No gross lesions in the duodenal bulb, in the first portion of the duodenum and in the second portion of the duodenum. Biopsied. - Normal major papilla. EUS Impression: - A heterogenous region/lesion was identified in the pancreatic head. However, the endosonographic appearance is not typical for adenocarcinoma. This could be inflammatory in nature. But with MRI findings and unexplained pancreatitis, FNB was performed. If malignancy is identified, this will be staged T2 N0 Mx by endosonographic criteria. - Multiple cystic lesions were seen in the pancreatic head, pancreatic body and pancreatic tail. Tissue has not been obtained. 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 - There was no sign of significant pathology in the common bile duct and in the common hepatic duct. - No malignant-appearing lymph nodes were visualized in the celiac region (level 20), peripancreatic region and porta hepatis region.   FINAL MICROSCOPIC DIAGNOSIS:   A. STOMACH, BIOPSY:  - Mild chronic gastritis.  - Warthin-Starry stain is negative for Helicobacter pylori.   B. DUODENUM, BIOPSY:  - No significant histopathologic findings.  - Negative for increased  intraepithelial lymphocytes and villous  architectural changes.   C. DUODENUM, POLYPECTOMY:  - Adenoma.  - Negative for high grade dysplasia.    FINAL MICROSCOPIC DIAGNOSIS:  - Atypical cells present     EGD/EUS 08/05/20: EGD Impression: - No gross lesions in esophagus. Non-obstructing Schatzki ring. - 2 cm hiatal hernia. - No gross lesions in the stomach. - No gross lesions in the duodenal bulb, in the first portion of the duodenum and in the second portion of the duodenum. EUS Impression: - A region of hypointensity was identified in the pancreatic head. This region looks improved and smaller than prior procedure. Because of previous atypical cells noted on lastEUS FNB, tissue was obtained from this exam, and results are pending to try to rule out a malignant process. However, the endosonographic appearance today, with improving features, seems to make the possibility of inflammatory changes more possible though malignancy is not ruled out as of yet. If this turns out to be a malignant process, then this will  be staged T1 N0 Mx by endosonographic criteria. Fine needle biopsy performed. - Multiple stones were visualized endosonographically in the gallbladder. - There was no sign of significant pathology in the common bile duct and in the common hepatic duct (smaller than previous EUS). - No malignant-appearing lymph nodes were visualized in the celiac region (level 20), peripancreatic region and porta hepatis region.   CYTOLOGY - NON PAP  CASE: [id number]  PATIENT: Kenneth Wenzlick  Non-Gynecological Cytology Report  Clinical History: Pancreatic mass  FINAL MICROSCOPIC DIAGNOSIS:   A. PANCREAS, HEAD, FINE NEEDLE ASPIRATION:  -  Rare atypical cells present  -  See comment   COMMENT:  There are atypical cells present; insufficient for diagnosis of  malignancy.  In addition, there is minimal inflammation present.  Dr.  Jodene reviewed the case and agrees with the above  diagnosis.     Colonoscopy 10/05/21: One 3 mm polyp in the cecum, removed with a cold snare. Resected and retrieved. - One 25 mm polyp in the cecum, removed piecemeal using a cold snare. Resected and retrieved. Clip was placed as outlined. - One 10 mm polyp in the transverse colon, removed with a cold snare. Resected and retrieved. - Two 3 mm polyps in the sigmoid colon, removed with a cold snare. Resected and retrieved. - Internal hemorrhoids. - The examination was otherwise normal. - No obvious evidence of pelvic floor dysfunction on DRE.   1. Surgical [P], colon, cecum, polyp (1) - TUBULAR ADENOMA WITHOUT HIGH GRADE DYSPLASIA. 2. Surgical [P], colon, cecum, polyp (1) - MULTIPLE FRAGMENTS OF SESSILE SERRATED POLYP WITHOUT CYTOLOGIC DYSPLASIA. 3. Surgical [P], colon, transverse, sigmoid, polyp (3) - SESSILE SERRATED POLYP WITHOUT CYTOLOGIC DYSPLASIA. - HYPERPLASTIC POLYP (S).   EGD 01/2022 - Esophagogastric landmarks identified. - 1 cm hiatal hernia.  - Gastroesophageal flap valve classified as Hill Grade II ( fold present, opens with respiration) .  - Normal esophagus otherwise - biopsies obtained.  - Normal stomach.  - A single duodenal polyp. Resected and retrieved.  - Normal duodenum otherwise.   Diagnosis 1. Surgical [P], duodenal polyp - DUODENAL MUCOSA WITH PROMINENT BRUNNER'S GLANDS AND FOCAL FOVEOLAR METAPLASIA CONSISTENT WITH CHRONIC PEPTIC DUODENITIS. - NEGATIVE FOR DYSPLASIA. 2. Surgical [P], esophageal biopsy - SQUAMOUS MUCOSA WITHIN NORMAL LIMITS.   Colonoscopy 04/2022: - Perianal skin tags found on perianal exam.  - The examined portion of the ileum was normal.  - Post- polypectomy scar in the cecum with residual hemostasis clip. - One 3 to 4 mm polyp in the ascending colon, removed with a cold snare. Resected and retrieved.  - One 6 mm polyp in the rectum, removed with a cold snare. Resected and retrieved - Internal hemorrhoids.  - The examination was  otherwise normal. - 3 year recall colonoscopy    Diagnosis 1. Surgical [P], colon, ascending, polyp (1) - POLYPOID FRAGMENT OF BENIGN COLONIC MUCOSA WITH LYMPHOID AGGREGATE 2. Surgical [P], colon, rectum, polyp (1) - HYPERPLASTIC POLYP. - NO DYSPLASIA OR MALIGNANCY    Medications Ordered Prior to Encounter[1] Allergies[2]  Current Medications, Allergies, Past Medical History, Past Surgical History, Family History and Social History were reviewed in [employer] record.  Review of Systems:   Constitutional: Negative for fever, sweats, chills or weight loss.  Respiratory: Negative for shortness of breath.   Cardiovascular: Negative for chest pain, palpitations and leg swelling.  Gastrointestinal: See HPI.  Musculoskeletal: Negative for back pain or muscle aches.  Neurological: Negative for dizziness, headaches or paresthesias.  Physical Exam: BP 126/70   Pulse 69   Ht 5' 7 (1.702 m)   Wt 161 lb 2 oz (73.1 kg)   LMP  (LMP Unknown)   BMI 25.24 kg/m   General: 69 year old female in no acute distress. Head: Normocephalic and atraumatic. Eyes: No scleral icterus. Conjunctiva pink . Ears: Normal auditory acuity. Mouth: Dentition intact. No ulcers or lesions.  Lungs: Clear throughout to auscultation. Heart: Regular rate and rhythm, no murmur. Abdomen: Soft, nontender and nondistended. No masses or hepatomegaly. Normal bowel sounds x 4 quadrants.  Rectal: Deferred. Musculoskeletal: Symmetrical with no gross deformities. Extremities: No edema. Neurological: Alert oriented x 4. No focal deficits.  Psychological: Alert and cooperative. Normal mood and affect  Assessment and Recommendations:  69 year old female with a prior history of pancreatitis who developed recurrent N/V and epigastric pain which radiated under the right and left rib cage through to the back x 4 days admitted 12/28 - 05/13/2024.  MRI/MRCP showed acute pancreatitis with possible early  pseudocysts, mildly dilated CBD with no definite choledocholithiasis. I suspect the patient possibly passed micro choledocholithiasis resulting in acute N/V and upper abdominal pain with elevated LFTs and mildly elevated lipase levels which quickly downtrended.  Jardiance was discontinued.  Patient remains on Mounjaro. - CBC, CMP and lipase level - Repeat abdominal MRI/MRCP with and without contrast in 4 weeks - Avoid fatty food - Patient instructed to go to the emergency room if she develops severe nausea, vomiting, upper abdominal or back pain  Chronically mildly elevated ALT levels, likely secondary to hepatic steatosis  History of colon polyps.  Her most recent colonoscopy 09/2021 identified 125 mm polyp removed from the cecum, one 3 mm polyp removed from the cecum, one 10 mm polyp removed from the transverse colon and two 3 mm polyps removed from the sigmoid colon.  Path report was consistent with tubular adenomatous and sessile serrated polyps. - Next colon polyp surveillance colonoscopy due 09/2024      [1]  Current Outpatient Medications on File Prior to Visit  Medication Sig Dispense Refill   acetaminophen  (TYLENOL ) 325 MG tablet Take 2 tablets (650 mg total) by mouth every 6 (six) hours. (Patient taking differently: Take 650 mg by mouth every 6 (six) hours as needed.)     albuterol  (VENTOLIN  HFA) 108 (90 Base) MCG/ACT inhaler Inhale into the lungs every 6 (six) hours as needed for wheezing or shortness of breath.     ALPRAZolam  (XANAX ) 0.5 MG tablet Take 0.5 mg by mouth 3 (three) times daily as needed.     aspirin  EC 81 MG tablet Take 1 tablet (81 mg total) by mouth at bedtime. 30 tablet 11   baclofen  (LIORESAL ) 10 MG tablet Take 1 tablet (10 mg total) by mouth at bedtime. 90 tablet 3   Cholecalciferol (VITAMIN D) 50 MCG (2000 UT) CAPS Take 2,000 Units by mouth in the morning.     citalopram  (CELEXA ) 40 MG tablet Take 40 mg by mouth daily.     Cyanocobalamin (B-12) 5000 MCG SUBL Take  1 tablet by mouth daily.     diltiazem  (CARDIZEM ) 30 MG tablet Take 1 tablet every 4 hours AS NEEDED for heart rate >100 as long as top blood pressure >100. 45 tablet 1   fexofenadine (ALLEGRA) 180 MG tablet Take 180 mg by mouth in the morning.     gabapentin  (NEURONTIN ) 600 MG tablet Take 600 mg by mouth at bedtime.     HYDROMET 5-1.5 MG/5ML syrup Take 5 mLs by  mouth 3 (three) times daily as needed.     levothyroxine  (SYNTHROID ) 175 MCG tablet Take 175 mcg by mouth daily before breakfast.     meloxicam (MOBIC) 15 MG tablet Take 15 mg by mouth daily as needed.     montelukast  (SINGULAIR ) 10 MG tablet Take 10 mg by mouth daily.     MOUNJARO 12.5 MG/0.5ML Pen Inject 12.5 mg into the skin once a week.     oxyCODONE  (ROXICODONE ) 5 MG immediate release tablet Take 1 tablet (5 mg total) by mouth every 4 (four) hours as needed. 5 tablet 0   pantoprazole  (PROTONIX ) 40 MG tablet TAKE 1 TABLET BY MOUTH TWICE DAILY BEFORE A MEAL 60 tablet 3   pramipexole (MIRAPEX) 0.5 MG tablet Take 0.5 mg by mouth at bedtime.     QUEtiapine  (SEROQUEL ) 25 MG tablet Take 25 mg by mouth at bedtime.     QUEtiapine  (SEROQUEL ) 50 MG tablet Take 50 mg by mouth at bedtime.     rosuvastatin (CRESTOR) 5 MG tablet Take 5 mg by mouth daily.     sodium chloride  (OCEAN) 0.65 % SOLN nasal spray Place 1 spray into both nostrils as needed for congestion.     spironolactone  (ALDACTONE ) 25 MG tablet Take 25 mg by mouth daily.     traMADol  (ULTRAM ) 50 MG tablet Take 50 mg by mouth every 12 (twelve) hours as needed.     JARDIANCE 10 MG TABS tablet Take 10 mg by mouth daily. (Patient not taking: Reported on 06/05/2024)     No current facility-administered medications on file prior to visit.  [2]  Allergies Allergen Reactions   Semaglutide Other (See Comments)    Pancreatitis with Lipase of 499.   "

## 2024-06-06 ENCOUNTER — Ambulatory Visit: Payer: Self-pay | Admitting: Nurse Practitioner

## 2024-06-06 NOTE — Progress Notes (Signed)
 Agree with assessment and plan as outlined.

## 2024-07-03 ENCOUNTER — Ambulatory Visit (HOSPITAL_COMMUNITY)
